# Patient Record
Sex: Female | Born: 1945
Health system: Southern US, Community
[De-identification: ages and names within clinical notes are randomized; demographics above are authoritative.]

## PROBLEM LIST (undated history)

## (undated) DIAGNOSIS — M1990 Unspecified osteoarthritis, unspecified site: Secondary | ICD-10-CM

## (undated) DIAGNOSIS — I10 Essential (primary) hypertension: Secondary | ICD-10-CM

## (undated) DIAGNOSIS — Z5189 Encounter for other specified aftercare: Secondary | ICD-10-CM

## (undated) DIAGNOSIS — G709 Myoneural disorder, unspecified: Secondary | ICD-10-CM

## (undated) DIAGNOSIS — J449 Chronic obstructive pulmonary disease, unspecified: Secondary | ICD-10-CM

## (undated) DIAGNOSIS — I499 Cardiac arrhythmia, unspecified: Secondary | ICD-10-CM

## (undated) DIAGNOSIS — R0600 Dyspnea, unspecified: Secondary | ICD-10-CM

## (undated) DIAGNOSIS — A048 Other specified bacterial intestinal infections: Secondary | ICD-10-CM

## (undated) DIAGNOSIS — K769 Liver disease, unspecified: Secondary | ICD-10-CM

## (undated) DIAGNOSIS — D126 Benign neoplasm of colon, unspecified: Secondary | ICD-10-CM

## (undated) DIAGNOSIS — D649 Anemia, unspecified: Secondary | ICD-10-CM

## (undated) DIAGNOSIS — R040 Epistaxis: Secondary | ICD-10-CM

## (undated) DIAGNOSIS — G20 Parkinson's disease: Secondary | ICD-10-CM

## (undated) DIAGNOSIS — C3490 Malignant neoplasm of unspecified part of unspecified bronchus or lung: Secondary | ICD-10-CM

## (undated) DIAGNOSIS — K5792 Diverticulitis of intestine, part unspecified, without perforation or abscess without bleeding: Secondary | ICD-10-CM

## (undated) DIAGNOSIS — R011 Cardiac murmur, unspecified: Secondary | ICD-10-CM

## (undated) DIAGNOSIS — J45909 Unspecified asthma, uncomplicated: Secondary | ICD-10-CM

## (undated) DIAGNOSIS — F419 Anxiety disorder, unspecified: Secondary | ICD-10-CM

## (undated) DIAGNOSIS — C801 Malignant (primary) neoplasm, unspecified: Secondary | ICD-10-CM

## (undated) DIAGNOSIS — G4730 Sleep apnea, unspecified: Secondary | ICD-10-CM

## (undated) DIAGNOSIS — R911 Solitary pulmonary nodule: Secondary | ICD-10-CM

## (undated) HISTORY — DX: Malignant neoplasm of unspecified part of unspecified bronchus or lung: C34.90

## (undated) HISTORY — PX: VAGINAL HYSTERECTOMY: SUR661

## (undated) HISTORY — PX: APPENDECTOMY: SHX54

## (undated) HISTORY — DX: Diverticulitis of intestine, part unspecified, without perforation or abscess without bleeding: K57.92

## (undated) HISTORY — DX: Anemia, unspecified: D64.9

## (undated) HISTORY — PX: TONSILLECTOMY: SUR1361

## (undated) HISTORY — DX: Liver disease, unspecified: K76.9

## (undated) HISTORY — DX: Sleep apnea, unspecified: G47.30

## (undated) HISTORY — PX: CHOLECYSTECTOMY: SHX55

## (undated) HISTORY — PX: PARTIAL COLECTOMY: SHX5273

## (undated) HISTORY — DX: Epistaxis: R04.0

## (undated) HISTORY — DX: Parkinson's disease: G20

## (undated) HISTORY — PX: TOTAL KNEE ARTHROPLASTY: SHX125

## (undated) HISTORY — DX: Encounter for other specified aftercare: Z51.89

## (undated) HISTORY — DX: Benign neoplasm of colon, unspecified: D12.6

## (undated) HISTORY — PX: COLON SURGERY: SHX602

## (undated) HISTORY — DX: Other specified bacterial intestinal infections: A04.8

## (undated) HISTORY — DX: Solitary pulmonary nodule: R91.1

## (undated) HISTORY — PX: BREAST SURGERY: SHX581

---

## 1987-04-23 DIAGNOSIS — J45909 Unspecified asthma, uncomplicated: Secondary | ICD-10-CM | POA: Insufficient documentation

## 1991-04-25 DIAGNOSIS — E1142 Type 2 diabetes mellitus with diabetic polyneuropathy: Secondary | ICD-10-CM

## 1997-10-27 ENCOUNTER — Encounter: Admission: RE | Admit: 1997-10-27 | Discharge: 1998-01-25 | Payer: Self-pay | Admitting: Internal Medicine

## 1998-07-28 ENCOUNTER — Ambulatory Visit (HOSPITAL_COMMUNITY): Admission: RE | Admit: 1998-07-28 | Discharge: 1998-07-28 | Payer: Self-pay | Admitting: Gastroenterology

## 1998-12-01 ENCOUNTER — Encounter: Payer: Self-pay | Admitting: Emergency Medicine

## 1998-12-01 ENCOUNTER — Inpatient Hospital Stay (HOSPITAL_COMMUNITY): Admission: EM | Admit: 1998-12-01 | Discharge: 1998-12-03 | Payer: Self-pay | Admitting: Emergency Medicine

## 1998-12-02 ENCOUNTER — Encounter: Payer: Self-pay | Admitting: Internal Medicine

## 2000-11-11 ENCOUNTER — Ambulatory Visit (HOSPITAL_BASED_OUTPATIENT_CLINIC_OR_DEPARTMENT_OTHER): Admission: RE | Admit: 2000-11-11 | Discharge: 2000-11-11 | Payer: Self-pay | Admitting: Otolaryngology

## 2001-11-28 ENCOUNTER — Ambulatory Visit (HOSPITAL_COMMUNITY): Admission: RE | Admit: 2001-11-28 | Discharge: 2001-11-28 | Payer: Self-pay | Admitting: Family Medicine

## 2001-11-28 ENCOUNTER — Encounter: Payer: Self-pay | Admitting: Family Medicine

## 2003-07-31 ENCOUNTER — Encounter: Payer: Self-pay | Admitting: Gastroenterology

## 2003-11-28 ENCOUNTER — Emergency Department (HOSPITAL_COMMUNITY): Admission: EM | Admit: 2003-11-28 | Discharge: 2003-11-28 | Payer: Self-pay | Admitting: Emergency Medicine

## 2004-01-19 ENCOUNTER — Inpatient Hospital Stay (HOSPITAL_COMMUNITY): Admission: RE | Admit: 2004-01-19 | Discharge: 2004-01-25 | Payer: Self-pay | Admitting: Specialist

## 2004-01-19 ENCOUNTER — Ambulatory Visit: Payer: Self-pay | Admitting: Physical Medicine & Rehabilitation

## 2004-09-08 ENCOUNTER — Encounter
Admission: RE | Admit: 2004-09-08 | Discharge: 2004-09-08 | Payer: Self-pay | Admitting: Physical Medicine and Rehabilitation

## 2006-06-23 ENCOUNTER — Encounter: Admission: RE | Admit: 2006-06-23 | Discharge: 2006-06-23 | Payer: Self-pay | Admitting: Specialist

## 2006-12-21 ENCOUNTER — Encounter: Admission: RE | Admit: 2006-12-21 | Discharge: 2006-12-21 | Payer: Self-pay | Admitting: Specialist

## 2007-04-03 ENCOUNTER — Ambulatory Visit: Payer: Self-pay | Admitting: Gastroenterology

## 2007-04-23 DIAGNOSIS — F411 Generalized anxiety disorder: Secondary | ICD-10-CM

## 2007-04-23 DIAGNOSIS — I10 Essential (primary) hypertension: Secondary | ICD-10-CM

## 2007-04-23 DIAGNOSIS — E119 Type 2 diabetes mellitus without complications: Secondary | ICD-10-CM | POA: Insufficient documentation

## 2007-04-23 DIAGNOSIS — M129 Arthropathy, unspecified: Secondary | ICD-10-CM | POA: Insufficient documentation

## 2007-04-23 DIAGNOSIS — G4730 Sleep apnea, unspecified: Secondary | ICD-10-CM | POA: Insufficient documentation

## 2007-05-26 DIAGNOSIS — D126 Benign neoplasm of colon, unspecified: Secondary | ICD-10-CM

## 2007-05-26 HISTORY — DX: Benign neoplasm of colon, unspecified: D12.6

## 2007-05-30 ENCOUNTER — Inpatient Hospital Stay (HOSPITAL_COMMUNITY): Admission: EM | Admit: 2007-05-30 | Discharge: 2007-06-02 | Payer: Self-pay | Admitting: Emergency Medicine

## 2007-06-03 ENCOUNTER — Ambulatory Visit: Payer: Self-pay | Admitting: Internal Medicine

## 2007-06-05 ENCOUNTER — Ambulatory Visit: Payer: Self-pay | Admitting: Gastroenterology

## 2007-06-05 LAB — CONVERTED CEMR LAB
Ferritin: 59 ng/mL (ref 10–291)
Iron: 38 ug/dL — ABNORMAL LOW (ref 42–145)

## 2007-06-14 ENCOUNTER — Ambulatory Visit: Payer: Self-pay | Admitting: Gastroenterology

## 2007-06-14 ENCOUNTER — Encounter: Payer: Self-pay | Admitting: Gastroenterology

## 2007-09-12 ENCOUNTER — Telehealth: Payer: Self-pay | Admitting: Gastroenterology

## 2007-09-13 ENCOUNTER — Ambulatory Visit: Payer: Self-pay | Admitting: Cardiovascular Disease

## 2007-09-13 ENCOUNTER — Ambulatory Visit: Payer: Self-pay | Admitting: Gastroenterology

## 2007-09-13 LAB — CONVERTED CEMR LAB
BUN: 9 mg/dL (ref 6–23)
HCT: 38.1 % (ref 36.0–46.0)
Hemoglobin: 12.9 g/dL (ref 12.0–15.0)
Lymphocytes Relative: 22 % (ref 12–46)
Lymphs Abs: 1.4 10*3/uL (ref 0.7–4.0)
Monocytes Absolute: 0.3 10*3/uL (ref 0.1–1.0)
Monocytes Relative: 5 % (ref 3–12)
Neutro Abs: 4.7 10*3/uL (ref 1.7–7.7)
RBC: 4.78 M/uL (ref 3.87–5.11)

## 2007-09-17 ENCOUNTER — Telehealth: Payer: Self-pay | Admitting: Gastroenterology

## 2007-11-28 ENCOUNTER — Telehealth: Payer: Self-pay | Admitting: Gastroenterology

## 2007-12-27 DIAGNOSIS — K2990 Gastroduodenitis, unspecified, without bleeding: Secondary | ICD-10-CM

## 2007-12-27 DIAGNOSIS — Z860100 Personal history of colon polyps, unspecified: Secondary | ICD-10-CM | POA: Insufficient documentation

## 2007-12-27 DIAGNOSIS — K5730 Diverticulosis of large intestine without perforation or abscess without bleeding: Secondary | ICD-10-CM | POA: Insufficient documentation

## 2007-12-27 DIAGNOSIS — K648 Other hemorrhoids: Secondary | ICD-10-CM | POA: Insufficient documentation

## 2007-12-27 DIAGNOSIS — D509 Iron deficiency anemia, unspecified: Secondary | ICD-10-CM

## 2007-12-27 DIAGNOSIS — K2970 Gastritis, unspecified, without bleeding: Secondary | ICD-10-CM | POA: Insufficient documentation

## 2007-12-27 DIAGNOSIS — K219 Gastro-esophageal reflux disease without esophagitis: Secondary | ICD-10-CM

## 2007-12-27 DIAGNOSIS — Z8711 Personal history of peptic ulcer disease: Secondary | ICD-10-CM

## 2007-12-31 ENCOUNTER — Ambulatory Visit: Payer: Self-pay | Admitting: Gastroenterology

## 2008-01-24 ENCOUNTER — Ambulatory Visit: Payer: Self-pay | Admitting: Gastroenterology

## 2008-01-24 ENCOUNTER — Telehealth: Payer: Self-pay | Admitting: Gastroenterology

## 2008-01-24 ENCOUNTER — Inpatient Hospital Stay (HOSPITAL_COMMUNITY): Admission: EM | Admit: 2008-01-24 | Discharge: 2008-01-26 | Payer: Self-pay | Admitting: Emergency Medicine

## 2008-01-27 ENCOUNTER — Telehealth: Payer: Self-pay | Admitting: Gastroenterology

## 2008-01-30 ENCOUNTER — Telehealth: Payer: Self-pay | Admitting: Gastroenterology

## 2008-02-03 ENCOUNTER — Ambulatory Visit: Payer: Self-pay | Admitting: Gastroenterology

## 2008-02-03 DIAGNOSIS — K5732 Diverticulitis of large intestine without perforation or abscess without bleeding: Secondary | ICD-10-CM | POA: Insufficient documentation

## 2008-02-03 LAB — CONVERTED CEMR LAB
BUN: 14 mg/dL (ref 6–23)
Basophils Absolute: 0 10*3/uL (ref 0.0–0.1)
Basophils Relative: 0 % (ref 0–1)
Creatinine, Ser: 0.62 mg/dL (ref 0.40–1.20)
Eosinophils Absolute: 0.1 10*3/uL (ref 0.0–0.7)
MCHC: 32.5 g/dL (ref 30.0–36.0)
MCV: 80.2 fL (ref 78.0–100.0)
Monocytes Relative: 7 % (ref 3–12)
Neutro Abs: 5.3 10*3/uL (ref 1.7–7.7)
Neutrophils Relative %: 66 % (ref 43–77)
Platelets: 308 10*3/uL (ref 150–400)
RDW: 14 % (ref 11.5–15.5)

## 2008-02-04 ENCOUNTER — Telehealth: Payer: Self-pay | Admitting: Gastroenterology

## 2008-02-05 ENCOUNTER — Telehealth: Payer: Self-pay | Admitting: Gastroenterology

## 2008-02-06 ENCOUNTER — Ambulatory Visit: Payer: Self-pay | Admitting: Internal Medicine

## 2008-02-13 ENCOUNTER — Telehealth: Payer: Self-pay | Admitting: Gastroenterology

## 2008-02-17 ENCOUNTER — Encounter: Payer: Self-pay | Admitting: Gastroenterology

## 2008-03-09 ENCOUNTER — Encounter: Admission: RE | Admit: 2008-03-09 | Discharge: 2008-03-09 | Payer: Self-pay | Admitting: Surgery

## 2008-04-15 ENCOUNTER — Inpatient Hospital Stay (HOSPITAL_COMMUNITY): Admission: RE | Admit: 2008-04-15 | Discharge: 2008-04-22 | Payer: Self-pay | Admitting: Surgery

## 2008-04-15 ENCOUNTER — Encounter (INDEPENDENT_AMBULATORY_CARE_PROVIDER_SITE_OTHER): Payer: Self-pay | Admitting: Surgery

## 2008-04-24 DIAGNOSIS — G20 Parkinson's disease: Secondary | ICD-10-CM

## 2008-04-24 DIAGNOSIS — G20A1 Parkinson's disease without dyskinesia, without mention of fluctuations: Secondary | ICD-10-CM

## 2008-04-24 HISTORY — DX: Parkinson's disease without dyskinesia, without mention of fluctuations: G20.A1

## 2008-04-24 HISTORY — DX: Parkinson's disease: G20

## 2008-04-30 ENCOUNTER — Emergency Department (HOSPITAL_COMMUNITY): Admission: EM | Admit: 2008-04-30 | Discharge: 2008-04-30 | Payer: Self-pay | Admitting: Emergency Medicine

## 2008-06-01 ENCOUNTER — Encounter: Admission: RE | Admit: 2008-06-01 | Discharge: 2008-06-01 | Payer: Self-pay | Admitting: Surgery

## 2008-07-09 ENCOUNTER — Encounter (INDEPENDENT_AMBULATORY_CARE_PROVIDER_SITE_OTHER): Payer: Self-pay | Admitting: Surgery

## 2008-07-09 ENCOUNTER — Inpatient Hospital Stay (HOSPITAL_COMMUNITY): Admission: RE | Admit: 2008-07-09 | Discharge: 2008-07-13 | Payer: Self-pay | Admitting: Surgery

## 2008-07-19 ENCOUNTER — Inpatient Hospital Stay (HOSPITAL_COMMUNITY): Admission: EM | Admit: 2008-07-19 | Discharge: 2008-08-05 | Payer: Self-pay | Admitting: Emergency Medicine

## 2008-12-31 ENCOUNTER — Emergency Department (HOSPITAL_COMMUNITY): Admission: EM | Admit: 2008-12-31 | Discharge: 2009-01-01 | Payer: Self-pay | Admitting: Emergency Medicine

## 2009-09-30 ENCOUNTER — Observation Stay (HOSPITAL_COMMUNITY): Admission: EM | Admit: 2009-09-30 | Discharge: 2009-10-01 | Payer: Self-pay | Admitting: Emergency Medicine

## 2010-02-23 ENCOUNTER — Emergency Department (HOSPITAL_COMMUNITY): Admission: EM | Admit: 2010-02-23 | Discharge: 2010-02-23 | Payer: Self-pay | Admitting: Emergency Medicine

## 2010-05-15 ENCOUNTER — Encounter: Payer: Self-pay | Admitting: Specialist

## 2010-07-11 LAB — LIPID PANEL
HDL: 37 mg/dL — ABNORMAL LOW (ref >39)
Total CHOL/HDL Ratio: 4.1 RATIO

## 2010-07-11 LAB — CBC
Hemoglobin: 11.7 g/dL — ABNORMAL LOW (ref 12.0–15.0)
Hemoglobin: 13.6 g/dL (ref 12.0–15.0)
MCHC: 32.9 g/dL (ref 30.0–36.0)
MCHC: 34 g/dL (ref 30.0–36.0)
MCV: 86.9 fL (ref 78.0–100.0)
Platelets: 181 10*3/uL (ref 150–400)
RBC: 4.08 MIL/uL (ref 3.87–5.11)
RDW: 14.7 % (ref 11.5–15.5)

## 2010-07-11 LAB — BASIC METABOLIC PANEL
CO2: 24 mEq/L (ref 19–32)
Calcium: 8.3 mg/dL — ABNORMAL LOW (ref 8.4–10.5)
Creatinine, Ser: 0.64 mg/dL (ref 0.4–1.2)
GFR calc Af Amer: >60 mL/min (ref >60)
Glucose, Bld: 128 mg/dL — ABNORMAL HIGH (ref 70–99)

## 2010-07-11 LAB — POCT I-STAT, CHEM 8
BUN: 10 mg/dL (ref 6–23)
Calcium, Ion: 1.02 mmol/L — ABNORMAL LOW (ref 1.12–1.32)
Chloride: 110 mEq/L (ref 96–112)
Creatinine, Ser: 0.5 mg/dL (ref 0.4–1.2)
Glucose, Bld: 143 mg/dL — ABNORMAL HIGH (ref 70–99)

## 2010-07-11 LAB — DIFFERENTIAL
Eosinophils Absolute: 0.1 10*3/uL (ref 0.0–0.7)
Eosinophils Relative: 2 % (ref 0–5)
Lymphocytes Relative: 21 % (ref 12–46)
Lymphs Abs: 1.5 10*3/uL (ref 0.7–4.0)
Monocytes Absolute: 0.4 10*3/uL (ref 0.1–1.0)
Monocytes Relative: 6 % (ref 3–12)

## 2010-07-11 LAB — CARDIAC PANEL(CRET KIN+CKTOT+MB+TROPI): Relative Index: INVALID (ref 0.0–2.5)

## 2010-07-11 LAB — APTT: aPTT: 66 seconds — ABNORMAL HIGH (ref 24–37)

## 2010-07-11 LAB — POCT CARDIAC MARKERS: Troponin i, poc: <0.05 ng/mL (ref 0.00–0.09)

## 2010-07-11 LAB — HEPARIN LEVEL (UNFRACTIONATED): Heparin Unfractionated: <0.1 IU/mL — ABNORMAL LOW (ref 0.30–0.70)

## 2010-07-11 LAB — MRSA PCR SCREENING: MRSA by PCR: NEGATIVE

## 2010-07-11 LAB — HEMOGLOBIN A1C: Hgb A1c MFr Bld: 7.4 % — ABNORMAL HIGH (ref <5.7)

## 2010-07-11 LAB — GLUCOSE, CAPILLARY
Glucose-Capillary: 146 mg/dL — ABNORMAL HIGH (ref 70–99)
Glucose-Capillary: 98 mg/dL (ref 70–99)

## 2010-07-11 LAB — MAGNESIUM: Magnesium: 1.9 mg/dL (ref 1.5–2.5)

## 2010-07-11 LAB — D-DIMER, QUANTITATIVE: D-Dimer, Quant: 0.4 ug/mL-FEU (ref 0.00–0.48)

## 2010-07-11 LAB — CK TOTAL AND CKMB (NOT AT ARMC)
Relative Index: INVALID (ref 0.0–2.5)
Total CK: 51 U/L (ref 7–177)

## 2010-07-29 LAB — URINE MICROSCOPIC-ADD ON

## 2010-07-29 LAB — COMPREHENSIVE METABOLIC PANEL
ALT: 17 U/L (ref 0–35)
AST: 22 U/L (ref 0–37)
Alkaline Phosphatase: 80 U/L (ref 39–117)
CO2: 25 mEq/L (ref 19–32)
Calcium: 9.3 mg/dL (ref 8.4–10.5)
GFR calc Af Amer: >60 mL/min (ref >60)
GFR calc non Af Amer: >60 mL/min (ref >60)
Glucose, Bld: 198 mg/dL — ABNORMAL HIGH (ref 70–99)
Potassium: 3.9 mEq/L (ref 3.5–5.1)
Sodium: 142 mEq/L (ref 135–145)
Total Protein: 7.2 g/dL (ref 6.0–8.3)

## 2010-07-29 LAB — CBC
HCT: 38.7 % (ref 36.0–46.0)
MCHC: 33 g/dL (ref 30.0–36.0)
MCV: 79.8 fL (ref 78.0–100.0)
WBC: 9 10*3/uL (ref 4.0–10.5)

## 2010-07-29 LAB — DIFFERENTIAL
Basophils Relative: 1 % (ref 0–1)
Eosinophils Absolute: 0.2 10*3/uL (ref 0.0–0.7)
Eosinophils Relative: 2 % (ref 0–5)
Lymphs Abs: 1.7 10*3/uL (ref 0.7–4.0)
Monocytes Relative: 5 % (ref 3–12)

## 2010-07-29 LAB — URINE CULTURE

## 2010-07-29 LAB — URINALYSIS, ROUTINE W REFLEX MICROSCOPIC
Glucose, UA: NEGATIVE mg/dL
Hgb urine dipstick: NEGATIVE
Ketones, ur: NEGATIVE mg/dL
Nitrite: NEGATIVE
Urobilinogen, UA: 0.2 mg/dL (ref 0.0–1.0)
pH: 5.5 (ref 5.0–8.0)

## 2010-08-03 LAB — COMPREHENSIVE METABOLIC PANEL
ALT: 18 U/L (ref 0–35)
AST: 15 U/L (ref 0–37)
AST: 20 U/L (ref 0–37)
Albumin: 2.8 g/dL — ABNORMAL LOW (ref 3.5–5.2)
Albumin: 3.1 g/dL — ABNORMAL LOW (ref 3.5–5.2)
Albumin: 3.1 g/dL — ABNORMAL LOW (ref 3.5–5.2)
Alkaline Phosphatase: 88 U/L (ref 39–117)
BUN: 13 mg/dL (ref 6–23)
BUN: 14 mg/dL (ref 6–23)
BUN: 8 mg/dL (ref 6–23)
CO2: 26 mEq/L (ref 19–32)
Calcium: 8.6 mg/dL (ref 8.4–10.5)
Calcium: 8.8 mg/dL (ref 8.4–10.5)
Chloride: 107 mEq/L (ref 96–112)
Chloride: 107 mEq/L (ref 96–112)
Creatinine, Ser: 0.61 mg/dL (ref 0.4–1.2)
Creatinine, Ser: 0.64 mg/dL (ref 0.4–1.2)
Creatinine, Ser: 0.77 mg/dL (ref 0.4–1.2)
GFR calc Af Amer: >60 mL/min (ref >60)
GFR calc Af Amer: >60 mL/min (ref >60)
GFR calc Af Amer: >60 mL/min (ref >60)
GFR calc non Af Amer: >60 mL/min (ref >60)
Glucose, Bld: 151 mg/dL — ABNORMAL HIGH (ref 70–99)
Potassium: 3.8 mEq/L (ref 3.5–5.1)
Sodium: 143 mEq/L (ref 135–145)
Total Bilirubin: 0.4 mg/dL (ref 0.3–1.2)
Total Bilirubin: 0.4 mg/dL (ref 0.3–1.2)
Total Protein: 6.3 g/dL (ref 6.0–8.3)
Total Protein: 6.4 g/dL (ref 6.0–8.3)
Total Protein: 6.6 g/dL (ref 6.0–8.3)

## 2010-08-03 LAB — GLUCOSE, CAPILLARY
Glucose-Capillary: 132 mg/dL — ABNORMAL HIGH (ref 70–99)
Glucose-Capillary: 133 mg/dL — ABNORMAL HIGH (ref 70–99)
Glucose-Capillary: 136 mg/dL — ABNORMAL HIGH (ref 70–99)
Glucose-Capillary: 136 mg/dL — ABNORMAL HIGH (ref 70–99)
Glucose-Capillary: 140 mg/dL — ABNORMAL HIGH (ref 70–99)
Glucose-Capillary: 141 mg/dL — ABNORMAL HIGH (ref 70–99)
Glucose-Capillary: 141 mg/dL — ABNORMAL HIGH (ref 70–99)
Glucose-Capillary: 142 mg/dL — ABNORMAL HIGH (ref 70–99)
Glucose-Capillary: 142 mg/dL — ABNORMAL HIGH (ref 70–99)
Glucose-Capillary: 143 mg/dL — ABNORMAL HIGH (ref 70–99)
Glucose-Capillary: 143 mg/dL — ABNORMAL HIGH (ref 70–99)
Glucose-Capillary: 147 mg/dL — ABNORMAL HIGH (ref 70–99)
Glucose-Capillary: 148 mg/dL — ABNORMAL HIGH (ref 70–99)
Glucose-Capillary: 149 mg/dL — ABNORMAL HIGH (ref 70–99)
Glucose-Capillary: 150 mg/dL — ABNORMAL HIGH (ref 70–99)
Glucose-Capillary: 151 mg/dL — ABNORMAL HIGH (ref 70–99)
Glucose-Capillary: 154 mg/dL — ABNORMAL HIGH (ref 70–99)
Glucose-Capillary: 155 mg/dL — ABNORMAL HIGH (ref 70–99)
Glucose-Capillary: 156 mg/dL — ABNORMAL HIGH (ref 70–99)
Glucose-Capillary: 158 mg/dL — ABNORMAL HIGH (ref 70–99)
Glucose-Capillary: 158 mg/dL — ABNORMAL HIGH (ref 70–99)
Glucose-Capillary: 159 mg/dL — ABNORMAL HIGH (ref 70–99)
Glucose-Capillary: 159 mg/dL — ABNORMAL HIGH (ref 70–99)
Glucose-Capillary: 164 mg/dL — ABNORMAL HIGH (ref 70–99)
Glucose-Capillary: 165 mg/dL — ABNORMAL HIGH (ref 70–99)
Glucose-Capillary: 166 mg/dL — ABNORMAL HIGH (ref 70–99)
Glucose-Capillary: 168 mg/dL — ABNORMAL HIGH (ref 70–99)
Glucose-Capillary: 170 mg/dL — ABNORMAL HIGH (ref 70–99)
Glucose-Capillary: 170 mg/dL — ABNORMAL HIGH (ref 70–99)
Glucose-Capillary: 171 mg/dL — ABNORMAL HIGH (ref 70–99)
Glucose-Capillary: 171 mg/dL — ABNORMAL HIGH (ref 70–99)
Glucose-Capillary: 176 mg/dL — ABNORMAL HIGH (ref 70–99)
Glucose-Capillary: 178 mg/dL — ABNORMAL HIGH (ref 70–99)
Glucose-Capillary: 181 mg/dL — ABNORMAL HIGH (ref 70–99)
Glucose-Capillary: 185 mg/dL — ABNORMAL HIGH (ref 70–99)
Glucose-Capillary: 187 mg/dL — ABNORMAL HIGH (ref 70–99)
Glucose-Capillary: 191 mg/dL — ABNORMAL HIGH (ref 70–99)
Glucose-Capillary: 197 mg/dL — ABNORMAL HIGH (ref 70–99)
Glucose-Capillary: 197 mg/dL — ABNORMAL HIGH (ref 70–99)
Glucose-Capillary: 201 mg/dL — ABNORMAL HIGH (ref 70–99)
Glucose-Capillary: 203 mg/dL — ABNORMAL HIGH (ref 70–99)
Glucose-Capillary: 203 mg/dL — ABNORMAL HIGH (ref 70–99)
Glucose-Capillary: 208 mg/dL — ABNORMAL HIGH (ref 70–99)
Glucose-Capillary: 215 mg/dL — ABNORMAL HIGH (ref 70–99)
Glucose-Capillary: 291 mg/dL — ABNORMAL HIGH (ref 70–99)

## 2010-08-03 LAB — CARDIAC PANEL(CRET KIN+CKTOT+MB+TROPI)
CK, MB: 0.3 ng/mL (ref 0.3–4.0)
CK, MB: 0.4 ng/mL (ref 0.3–4.0)
Total CK: 22 U/L (ref 7–177)
Troponin I: <0.01 ng/mL (ref 0.00–0.06)

## 2010-08-03 LAB — CHOLESTEROL, TOTAL: Cholesterol: 145 mg/dL (ref 0–200)

## 2010-08-03 LAB — CBC
HCT: 27.5 % — ABNORMAL LOW (ref 36.0–46.0)
HCT: 29.9 % — ABNORMAL LOW (ref 36.0–46.0)
Hemoglobin: 10.8 g/dL — ABNORMAL LOW (ref 12.0–15.0)
MCHC: 34.3 g/dL (ref 30.0–36.0)
MCV: 77.8 fL — ABNORMAL LOW (ref 78.0–100.0)
MCV: 78.9 fL (ref 78.0–100.0)
Platelets: 275 10*3/uL (ref 150–400)
RBC: 3.84 MIL/uL — ABNORMAL LOW (ref 3.87–5.11)
RBC: 4.11 MIL/uL (ref 3.87–5.11)
RDW: 14.4 % (ref 11.5–15.5)
WBC: 5 10*3/uL (ref 4.0–10.5)

## 2010-08-03 LAB — DIFFERENTIAL
Basophils Absolute: 0 10*3/uL (ref 0.0–0.1)
Basophils Relative: 0 % (ref 0–1)
Eosinophils Absolute: 0.2 10*3/uL (ref 0.0–0.7)
Eosinophils Relative: 3 % (ref 0–5)
Lymphocytes Relative: 32 % (ref 12–46)

## 2010-08-03 LAB — BASIC METABOLIC PANEL
BUN: 9 mg/dL (ref 6–23)
CO2: 23 mEq/L (ref 19–32)
CO2: 25 mEq/L (ref 19–32)
Calcium: 9 mg/dL (ref 8.4–10.5)
Chloride: 108 mEq/L (ref 96–112)
Chloride: 110 mEq/L (ref 96–112)
Glucose, Bld: 168 mg/dL — ABNORMAL HIGH (ref 70–99)
Potassium: 4 mEq/L (ref 3.5–5.1)
Sodium: 141 mEq/L (ref 135–145)

## 2010-08-03 LAB — PHOSPHORUS
Phosphorus: 4.1 mg/dL (ref 2.3–4.6)
Phosphorus: 4.2 mg/dL (ref 2.3–4.6)
Phosphorus: 4.5 mg/dL (ref 2.3–4.6)

## 2010-08-03 LAB — TRIGLYCERIDES: Triglycerides: 200 mg/dL — ABNORMAL HIGH (ref <150)

## 2010-08-03 LAB — MAGNESIUM
Magnesium: 2.1 mg/dL (ref 1.5–2.5)
Magnesium: 2.2 mg/dL (ref 1.5–2.5)

## 2010-08-03 LAB — PREALBUMIN: Prealbumin: 26.7 mg/dL (ref 18.0–45.0)

## 2010-08-04 LAB — GLUCOSE, CAPILLARY
Glucose-Capillary: 145 mg/dL — ABNORMAL HIGH (ref 70–99)
Glucose-Capillary: 145 mg/dL — ABNORMAL HIGH (ref 70–99)
Glucose-Capillary: 158 mg/dL — ABNORMAL HIGH (ref 70–99)
Glucose-Capillary: 161 mg/dL — ABNORMAL HIGH (ref 70–99)
Glucose-Capillary: 166 mg/dL — ABNORMAL HIGH (ref 70–99)
Glucose-Capillary: 166 mg/dL — ABNORMAL HIGH (ref 70–99)
Glucose-Capillary: 169 mg/dL — ABNORMAL HIGH (ref 70–99)
Glucose-Capillary: 173 mg/dL — ABNORMAL HIGH (ref 70–99)
Glucose-Capillary: 176 mg/dL — ABNORMAL HIGH (ref 70–99)
Glucose-Capillary: 184 mg/dL — ABNORMAL HIGH (ref 70–99)
Glucose-Capillary: 131 mg/dL — ABNORMAL HIGH (ref 70–99)
Glucose-Capillary: 133 mg/dL — ABNORMAL HIGH (ref 70–99)
Glucose-Capillary: 133 mg/dL — ABNORMAL HIGH (ref 70–99)
Glucose-Capillary: 142 mg/dL — ABNORMAL HIGH (ref 70–99)
Glucose-Capillary: 154 mg/dL — ABNORMAL HIGH (ref 70–99)
Glucose-Capillary: 158 mg/dL — ABNORMAL HIGH (ref 70–99)
Glucose-Capillary: 165 mg/dL — ABNORMAL HIGH (ref 70–99)
Glucose-Capillary: 170 mg/dL — ABNORMAL HIGH (ref 70–99)
Glucose-Capillary: 178 mg/dL — ABNORMAL HIGH (ref 70–99)
Glucose-Capillary: 188 mg/dL — ABNORMAL HIGH (ref 70–99)
Glucose-Capillary: 203 mg/dL — ABNORMAL HIGH (ref 70–99)

## 2010-08-04 LAB — DIFFERENTIAL
Basophils Absolute: 0 10*3/uL (ref 0.0–0.1)
Basophils Absolute: 0 10*3/uL (ref 0.0–0.1)
Basophils Relative: 1 % (ref 0–1)
Eosinophils Absolute: 0.2 10*3/uL (ref 0.0–0.7)
Eosinophils Relative: 4 % (ref 0–5)
Lymphocytes Relative: 18 % (ref 12–46)
Lymphocytes Relative: 23 % (ref 12–46)
Lymphs Abs: 1.2 10*3/uL (ref 0.7–4.0)
Lymphs Abs: 1.3 10*3/uL (ref 0.7–4.0)
Monocytes Absolute: 0.5 10*3/uL (ref 0.1–1.0)
Neutro Abs: 4 10*3/uL (ref 1.7–7.7)
Neutrophils Relative %: 64 % (ref 43–77)
Neutrophils Relative %: 67 % (ref 43–77)
Neutrophils Relative %: 72 % (ref 43–77)

## 2010-08-04 LAB — BASIC METABOLIC PANEL
BUN: 6 mg/dL (ref 6–23)
BUN: 7 mg/dL (ref 6–23)
CO2: 24 mEq/L (ref 19–32)
Calcium: 7.9 mg/dL — ABNORMAL LOW (ref 8.4–10.5)
Calcium: 8.3 mg/dL — ABNORMAL LOW (ref 8.4–10.5)
Creatinine, Ser: 0.65 mg/dL (ref 0.4–1.2)
Creatinine, Ser: 0.66 mg/dL (ref 0.4–1.2)
GFR calc Af Amer: >60 mL/min (ref >60)
GFR calc non Af Amer: >60 mL/min (ref >60)
GFR calc non Af Amer: >60 mL/min (ref >60)
GFR calc non Af Amer: >60 mL/min (ref >60)
Glucose, Bld: 215 mg/dL — ABNORMAL HIGH (ref 70–99)
Potassium: 3.5 mEq/L (ref 3.5–5.1)

## 2010-08-04 LAB — CBC
HCT: 25.6 % — ABNORMAL LOW (ref 36.0–46.0)
HCT: 26.6 % — ABNORMAL LOW (ref 36.0–46.0)
HCT: 28 % — ABNORMAL LOW (ref 36.0–46.0)
Hemoglobin: 8.6 g/dL — ABNORMAL LOW (ref 12.0–15.0)
Hemoglobin: 9.4 g/dL — ABNORMAL LOW (ref 12.0–15.0)
MCHC: 33.6 g/dL (ref 30.0–36.0)
MCHC: 33.8 g/dL (ref 30.0–36.0)
MCV: 79.7 fL (ref 78.0–100.0)
MCV: 80 fL (ref 78.0–100.0)
Platelets: 244 10*3/uL (ref 150–400)
RBC: 3.81 MIL/uL — ABNORMAL LOW (ref 3.87–5.11)
RBC: 3.2 MIL/uL — ABNORMAL LOW (ref 3.87–5.11)
RDW: 15.1 % (ref 11.5–15.5)
WBC: 5.2 10*3/uL (ref 4.0–10.5)
WBC: 6 10*3/uL (ref 4.0–10.5)
WBC: 7.2 10*3/uL (ref 4.0–10.5)

## 2010-08-04 LAB — COMPREHENSIVE METABOLIC PANEL
AST: 14 U/L (ref 0–37)
Alkaline Phosphatase: 68 U/L (ref 39–117)
BUN: 8 mg/dL (ref 6–23)
CO2: 26 mEq/L (ref 19–32)
CO2: 27 mEq/L (ref 19–32)
Calcium: 8.1 mg/dL — ABNORMAL LOW (ref 8.4–10.5)
Chloride: 108 mEq/L (ref 96–112)
Chloride: 110 mEq/L (ref 96–112)
Creatinine, Ser: 0.66 mg/dL (ref 0.4–1.2)
Creatinine, Ser: 0.73 mg/dL (ref 0.4–1.2)
GFR calc Af Amer: >60 mL/min (ref >60)
GFR calc non Af Amer: >60 mL/min (ref >60)
GFR calc non Af Amer: >60 mL/min (ref >60)
Glucose, Bld: 138 mg/dL — ABNORMAL HIGH (ref 70–99)
Glucose, Bld: 179 mg/dL — ABNORMAL HIGH (ref 70–99)
Potassium: 3.9 mEq/L (ref 3.5–5.1)
Total Bilirubin: 0.3 mg/dL (ref 0.3–1.2)
Total Bilirubin: 0.8 mg/dL (ref 0.3–1.2)

## 2010-08-04 LAB — LIPASE, BLOOD: Lipase: 26 U/L (ref 11–59)

## 2010-08-04 LAB — URINALYSIS, ROUTINE W REFLEX MICROSCOPIC
Hgb urine dipstick: NEGATIVE
Nitrite: NEGATIVE
Protein, ur: NEGATIVE mg/dL
Urobilinogen, UA: 0.2 mg/dL (ref 0.0–1.0)

## 2010-08-04 LAB — MAGNESIUM: Magnesium: 2.4 mg/dL (ref 1.5–2.5)

## 2010-08-04 LAB — CHOLESTEROL, TOTAL: Cholesterol: 130 mg/dL (ref 0–200)

## 2010-08-04 LAB — TRIGLYCERIDES: Triglycerides: 119 mg/dL (ref <150)

## 2010-08-04 LAB — URINE MICROSCOPIC-ADD ON

## 2010-08-04 LAB — PREALBUMIN: Prealbumin: 13.5 mg/dL — ABNORMAL LOW (ref 18.0–45.0)

## 2010-08-24 ENCOUNTER — Inpatient Hospital Stay (HOSPITAL_COMMUNITY)
Admission: EM | Admit: 2010-08-24 | Discharge: 2010-08-26 | DRG: 390 | Disposition: A | Discharge status: Home / Self Care | Payer: Self-pay | Attending: General Surgery | Admitting: General Surgery

## 2010-08-24 ENCOUNTER — Emergency Department (HOSPITAL_COMMUNITY): Payer: Self-pay

## 2010-08-24 ENCOUNTER — Encounter (HOSPITAL_COMMUNITY): Payer: Self-pay | Admitting: Radiology

## 2010-08-24 DIAGNOSIS — K56609 Unspecified intestinal obstruction, unspecified as to partial versus complete obstruction: Principal | ICD-10-CM | POA: Diagnosis present

## 2010-08-24 DIAGNOSIS — E119 Type 2 diabetes mellitus without complications: Secondary | ICD-10-CM | POA: Diagnosis present

## 2010-08-24 DIAGNOSIS — Z794 Long term (current) use of insulin: Secondary | ICD-10-CM

## 2010-08-24 DIAGNOSIS — I10 Essential (primary) hypertension: Secondary | ICD-10-CM | POA: Diagnosis present

## 2010-08-24 DIAGNOSIS — F411 Generalized anxiety disorder: Secondary | ICD-10-CM | POA: Diagnosis present

## 2010-08-24 HISTORY — DX: Essential (primary) hypertension: I10

## 2010-08-24 LAB — HEPATIC FUNCTION PANEL
AST: 13 U/L (ref 0–37)
Albumin: 4.1 g/dL (ref 3.5–5.2)
Total Bilirubin: 0.2 mg/dL — ABNORMAL LOW (ref 0.3–1.2)
Total Protein: 8 g/dL (ref 6.0–8.3)

## 2010-08-24 LAB — BASIC METABOLIC PANEL
BUN: 12 mg/dL (ref 6–23)
Creatinine, Ser: 0.65 mg/dL (ref 0.4–1.2)
GFR calc non Af Amer: >60 mL/min (ref >60)
Glucose, Bld: 206 mg/dL — ABNORMAL HIGH (ref 70–99)
Potassium: 4.2 mEq/L (ref 3.5–5.1)

## 2010-08-24 LAB — DIFFERENTIAL
Eosinophils Relative: 3 % (ref 0–5)
Lymphocytes Relative: 27 % (ref 12–46)
Lymphs Abs: 2.6 10*3/uL (ref 0.7–4.0)
Monocytes Absolute: 0.6 10*3/uL (ref 0.1–1.0)
Monocytes Relative: 6 % (ref 3–12)

## 2010-08-24 LAB — URINALYSIS, ROUTINE W REFLEX MICROSCOPIC
Bilirubin Urine: NEGATIVE
Hgb urine dipstick: NEGATIVE
Ketones, ur: NEGATIVE mg/dL
Nitrite: NEGATIVE
pH: 5.5 (ref 5.0–8.0)

## 2010-08-24 LAB — CBC
HCT: 42.4 % (ref 36.0–46.0)
MCH: 29.9 pg (ref 26.0–34.0)
MCHC: 35.1 g/dL (ref 30.0–36.0)
MCV: 85 fL (ref 78.0–100.0)
RDW: 13.2 % (ref 11.5–15.5)
WBC: 9.6 10*3/uL (ref 4.0–10.5)

## 2010-08-24 LAB — URINE MICROSCOPIC-ADD ON

## 2010-08-24 MED ORDER — IOHEXOL 300 MG/ML  SOLN
100.0000 mL | Freq: Once | INTRAMUSCULAR | Status: AC | PRN
  Administered 2010-08-24: 100 mL via INTRAVENOUS

## 2010-08-25 ENCOUNTER — Inpatient Hospital Stay (HOSPITAL_COMMUNITY): Payer: Self-pay

## 2010-08-25 LAB — DIFFERENTIAL
Basophils Relative: 0 % (ref 0–1)
Lymphocytes Relative: 17 % (ref 12–46)
Lymphs Abs: 1.7 10*3/uL (ref 0.7–4.0)
Monocytes Absolute: 0.7 10*3/uL (ref 0.1–1.0)
Monocytes Relative: 7 % (ref 3–12)
Neutro Abs: 7.6 10*3/uL (ref 1.7–7.7)

## 2010-08-25 LAB — CBC
HCT: 39.4 % (ref 36.0–46.0)
Hemoglobin: 13.5 g/dL (ref 12.0–15.0)
MCH: 29.5 pg (ref 26.0–34.0)
MCHC: 34.3 g/dL (ref 30.0–36.0)
MCV: 86 fL (ref 78.0–100.0)

## 2010-08-25 LAB — BASIC METABOLIC PANEL
BUN: 11 mg/dL (ref 6–23)
CO2: 25 mEq/L (ref 19–32)
Chloride: 103 mEq/L (ref 96–112)
Creatinine, Ser: 0.67 mg/dL (ref 0.4–1.2)
Potassium: 4.3 mEq/L (ref 3.5–5.1)

## 2010-08-25 LAB — GLUCOSE, CAPILLARY: Glucose-Capillary: 114 mg/dL — ABNORMAL HIGH (ref 70–99)

## 2010-08-25 LAB — HEMOGLOBIN A1C: Mean Plasma Glucose: 169 mg/dL — ABNORMAL HIGH (ref <117)

## 2010-08-26 ENCOUNTER — Inpatient Hospital Stay (HOSPITAL_COMMUNITY): Payer: Self-pay

## 2010-08-26 LAB — GLUCOSE, CAPILLARY: Glucose-Capillary: 148 mg/dL — ABNORMAL HIGH (ref 70–99)

## 2010-08-26 LAB — BASIC METABOLIC PANEL
Calcium: 9.1 mg/dL (ref 8.4–10.5)
Creatinine, Ser: 0.5 mg/dL (ref 0.4–1.2)
GFR calc Af Amer: >60 mL/min (ref >60)
GFR calc non Af Amer: >60 mL/min (ref >60)
Glucose, Bld: 123 mg/dL — ABNORMAL HIGH (ref 70–99)
Sodium: 140 mEq/L (ref 135–145)

## 2010-08-29 NOTE — H&P (Signed)
[name], [name]            ACCOUNT NO.:  [id number]  MEDICAL RECORD NO.:  [id number]           PATIENT TYPE:  I  LOCATION:  5118                         FACILITY:  [facility]  PHYSICIAN:  Cherylynn Ridges, M.D.    DATE OF BIRTH:  [birth year]  DATE OF ADMISSION:  08/24/2010 DATE OF DISCHARGE:                             HISTORY & PHYSICAL   IDENTIFICATION/CHIEF COMPLAINT:  The patient is a 65 year old female with an early partial small-bowel obstruction.  HISTORY OF PRESENT ILLNESS:  The patient reports being ill since just prior to the weekend when she developed some cramping and lower abdominal pain.  This is associated with nausea, but no vomiting.  She questioned possibly some chills, but no documented fevers.  She had been doing well for several years after her last surgery which was in 2010 by Dr. Luisa Hart for a takedown of ileostomy.  She otherwise was feeling well, but then the pain take of which she came in today when the pain was unrelenting.  It was a 10/10 pain when she came to the emergency room.  A CT scan of the abdomen and pelvis with contrast demonstrated some dilated loops in her pelvis, but not a high-grade small bowel obstruction.  It appeared to be some type of transition point in the pelvis either an internal hernia or adhesive partial small-bowel obstruction.  Surgery consultation was obtained.  PAST MEDICAL HISTORY:  Significant for; 1. Morbid obesity. 2. Insulin-dependent diabetes mellitus. 3. Hypertension. 4. Depression. 5. Chronic obstructive pulmonary disease. 6. Obstructive sleep apnea. 7. Coronary artery disease atypical with a normal cath in June 2011. 8. Parkinson's disease. 9. Dyslipidemia.  MEDICATIONS:  Listed today include: 1. Benazepril. 2. Lantus. 3. Xanax. 4. Simvastatin. 5. Carbidopa. 6. Iron. 7. Levodopa 8. Neurontin. 9. Vitamin B12. 10.There is a possibility that she is also taking aspirin, but not on     Plavix.  ALLERGIES:   She has no documented drug allergies, although on one documented history and physical it said something about being allergic to DILAUDID and OXYCODONE, this was not confirmed by the patient.  REVIEW OF SYSTEMS:  She had a normal bowel movement this morning even though she was having no crampy abdominal pain.  She has had no fevers. No diarrhea.  She has had no vomiting.  PHYSICAL EXAMINATION:  VITAL SIGNS:  She is afebrile at 98.6, pulse of 104, blood pressure 109/62. HEENT:  She is normocephalic, atraumatic, and anicteric.  She has an NG tube coming out, I believe over left nostril, which is to low intermittent suction with minimal amounts of diluted bilious fluid. NECK:  Supple with no palpable masses.  She had no bruits. LUNGS:  Clear to auscultation.  She has no wheezing. CARDIAC:  Regular rhythm and rate with no murmurs. ABDOMEN:  Soft.  She is morbidly obese.  There is hypoactive bowel sounds.  No palpable hernias.  No rebound or guarding. RECTAL:  Not performed. PELVIC:  Not performed. NEUROLOGIC:  Cranial nerves II-XII appear to be grossly intact.  She has got normal mental functioning.  Does not appear to be in any depressed mood state or psychotic  state.  LABORATORY STUDIES:  White count is normal at 9.8, hemoglobin was 14.9, hematocrit of 42.4.  Her electrolytes are all within normal limits with glucose is 206.  UA demonstrates no evidence of UTI.  I have reviewed her CT scan which shows partial low grade small-bowel obstruction in the pelvis, and no evidence of perforation, free air, or bleeding.  IMPRESSION:  Early partial small-bowel obstruction with a possible hairpin loop, internal hernia, or adhesive obstruction.  PLAN:  The plan is to bring him to the hospital for IV pain control, IV hydration, NG tube decompression, and hopefully getting through this with all surgical intervention.  She will be admitted to the surgical hospitalist or doc of the week program for  continued care.  There is no need for surgical intervention tonight.  We will put her on an insulin sliding scale after admission and repeat her x-rays in the morning.  No antibiotics are necessary at this time.     Cherylynn Ridges, M.D.     JOW/MEDQ  D:  08/24/2010  T:  08/25/2010  Job:  [id number]  Electronically Signed by Jimmye Norman M.D. on 08/29/2010 04:13:23 PM

## 2010-09-06 NOTE — H&P (Signed)
[name], [name]            ACCOUNT NO.:  [id number]   MEDICAL RECORD NO.:  [id number]          PATIENT TYPE:  EMS   LOCATION:  MAJO                         FACILITY:  [facility]   PHYSICIAN:  Hedwig Morton. Juanda Chance, MD     DATE OF BIRTH:  13-Dec-1945   DATE OF ADMISSION:  05/30/2007  DATE OF DISCHARGE:                              HISTORY & PHYSICAL   PROBLEM:  Diverticulitis.   HISTORY:  [name] is a pleasant 65 year old white female, primary  patient of Dr. Wylene Simmer, known recently to Dr. Russella Dar with GI consult in  the office.  The patient has a history of insulin-dependent diabetes  mellitus, osteoarthritis, asthma who presents to the emergency room with  a throbbing left lower quadrant pain x1 week which has been progressive.  The patient says the pain was 20/10 last evening and has been  constant.  She did have fever to 101 last evening at home and has been  having chills.  Initially she thought she had a urinary tract infection  because she was having some pressure.  She saw Dr. Wylene Simmer in his office  last week.  A UA was negative and she was placed on a 7 day course of  Cipro 500 b.i.d. for possible diverticulitis.  Her symptoms have not  improved at all with p.o. antibiotics and she presents to the emergency  room today for further evaluation.  She has not had any diarrhea, melena  or hematochezia.  White count is elevated at 12.4 and CT scan of the  abdomen and pelvis is consistent with an acute sigmoid diverticulitis.  There is no evidence of perforation or abscess.  She is admitted at this  time for bowel rest, IV antibiotics and pain control.   PAST MEDICAL HISTORY:  As outlined above.  In addition, she has had a  right total knee replacement, hysterectomy, appendectomy,  cholecystectomy, bilateral mastectomies with implants which were done  prophylactically due to family history.  She also has history of GERD  and apparently sleep apnea.   MEDICATIONS:  1. Advair she  believes 250 or 350/50 b.i.d.  2. Albuterol inhaler p.r.n.  3. Lantus 20 units daily at bedtime.  4. Humalog 10 units a.c.  5. Xanax 0.5 t.i.d.   ALLERGIES:  No known drug allergies.   FAMILY HISTORY:  Is pertinent for breast cancer, diabetes mellitus.  Negative for colon cancer or IBD.   SOCIAL HISTORY:  The patient is single.  Her son lives with her.  She is  employed in data entry, is ambulatory.  She is a nonsmoker, nondrinker.   REVIEW OF SYSTEMS:  HEENT:  Is unremarkable.  CARDIOVASCULAR:  Denies  any chest pain or anginal symptoms.  PULMONARY:  Negative for cough,  shortness of breath or sputum production.  GENITOURINARY:  No current  dysuria, urgency or frequency.  GI:  As outlined above.  MUSCULOSKELETAL:  Pertinent for osteoarthritis.  She has low back pain  and problems with her knees.  NEUROLOGICAL:  Is negative.  All other  review of systems negative.   LABS:  WBC of 12.4, hemoglobin 13.1, hematocrit of  39, MCV of 77.  LFTs  within normal limits.  Lipase 22.  UA 0-2 RBCs, BUN 8, creatinine 0.69.   PHYSICAL EXAMINATION:  GENERAL:  A well-developed, obese white female in  no acute distress.  Alert and oriented x3.  VITAL SIGNS:  Pulses in the 70s.  She is uncomfortable.  HEENT:  Nontraumatic, normocephalic.  EOMI, PERRLA.  Sclerae anicteric.  NECK:  Neck is supple without nodes.  CARDIOVASCULAR:  Regular rate and rhythm with S1-S2.  No murmur, rub or  gallop.  PULMONARY:  Clear to A and P.  ABDOMEN:  Obese, large, soft.  Bowel sounds are present.  She is tender  in the left lower quadrant and suprapubic region.  There is no rebound.  No guarding.  RECTAL:  Exam is not done at this time.  EXTREMITIES:  Without clubbing, cyanosis or edema.  She does have a scar  over her right knee.  NEUROLOGICAL:  Grossly nonfocal.   IMPRESSION:  4. A 64 year old white female with acute sigmoid diverticulitis,      recurrent.  2. Insulin-dependent diabetes mellitus.  3.  Obesity.  4. Osteoarthritis.  5. Asthma.  6. Status post cholecystectomy, appendectomy and hysterectomy.   PLAN:  The patient is admitted to the service of Dr. Lina Sar for IV  fluid hydration, bowel rest, pain control.  Will start IV Cipro and IV  Flagyl and place her on sliding scale insulin coverage and she will  eventually need colonoscopy which is scheduled for later this month once  her diverticulitis has resolved.      [name] [place], PA-C      [name], MD  Electronically Signed    AE/MEDQ  D:  05/30/2007  T:  05/31/2007  Job:  [id number]   cc:   Venita Lick. Russella Dar, MD, Lauro Franklin, M.D.

## 2010-09-06 NOTE — Op Note (Signed)
[name], [name]            ACCOUNT NO.:  [id number]   MEDICAL RECORD NO.:  [id number]          PATIENT TYPE:  INP   LOCATION:  0004                         FACILITY:  [facility]   PHYSICIAN:  Thomas A. Cornett, M.D.DATE OF BIRTH:  May 25, 1945   DATE OF PROCEDURE:  07/09/2008  DATE OF DISCHARGE:                               OPERATIVE REPORT   PREOPERATIVE DIAGNOSIS:  History of diverticulitis, status post sigmoid  colon resection requiring loop ileostomy to allow healing of anastomosis  of pelvis.   POSTOPERATIVE DIAGNOSIS:  History of diverticulitis, status post sigmoid  colon resection requiring loop ileostomy to allow healing of anastomosis  of pelvis.   PROCEDURE:  Closure of loop ileostomy.   SURGEON:  Maisie Fus A. Cornett, M.D.   ASSISTANT:  Wilmon Arms. Tsuei, M.D.   ANESTHESIA:  General endotracheal anesthesia.   ESTIMATED BLOOD LOSS:  150 cc.   SPECIMEN:  Ileum and ostomy to pathology.   INDICATIONS FOR PROCEDURE:  The patient is a 65 year old female with  morbid obesity who underwent sigmoid colectomy in December of last year.  There was some leakage at her anastomosis during the initial procedure  and a loop ileostomy was constructed to allow healing of this  intraoperatively.  This anastomosis has healed adequately and she is  here today to have her loop ileostomy closed such that she may have  continence again of stool through her rectum.  She does have diabetes  mellitus and multiple risk factors and morbid obesity, which I talked to  her about preoperatively.  Complications of bleeding, infection,  anastomotic leak, anastomotic malfunction or breakdown, abdominal wall  dehiscence, infection, small-bowel obstruction, all potential  complications which we discussed preoperatively.  She agreed to proceed.   DESCRIPTION OF PROCEDURE:  The patient was brought to the operating room  and placed supine.  After induction of general anesthesia, the right  lower quadrant  ostomy site was prepped and draped in a sterile fashion.  A curvilinear incision was made around the ostomy.  We then dissected  circumferentially around the ostomy until we encountered the fascia.  The fascia of the ileostomy was densely adherent to the fascia and it  took some time to dissect this off the fascia and fascial edge.  We made  a small serosal tear on the efferent limb doing this.  Eventually we  were able to dissect circumferentially around this, but this was quite  difficult due to her obesity and these adhesions, but we were able to  clear this up and examine the bowel.  I was able to mobilize more of the  small bowel up into the wound.  Given the location of the serosal tear,  I was not comfortable trying to perform a side-to-side stapled  anastomosis and felt that a hand-sewn anastomosis would be better in  this setting using the area of serosal tear as an enterotomy, which I  used.  We then put a stitch in across the anastomosis.  We then made 2  separate enterotomies about 3 cm length on the afferent and efferent  limb.  We then constructed a side-to-side anastomosis  using 3-0 silk  sutures in an interrupted fashion with a single layer.  The posterior  wall was done first and then we transitioned to the anterior wall with  care taken not to narrow or stenose the anastomosis.  Once we were able  to close the entire anastomosis, we amputated the ostomy and small bowel  associated with it above this.  The mesentery was taken down in between  the clamps with 2-0 and 3-0 silk ties.  We examined the anastomosis and  found to be hemostatic.  A 2-0 silk was used in the crotch of the  anastomosis to take the tension off this.  The anastomosis was patent to  2 fingers.  It showed no signs of ischemia.  I did not see any leakage  of enteric contents at this point in time.  I then irrigated the  abdomen.  At this point in time the anastomosis looked good without  tension.  We  reduced this back into the intra-abdominal cavity.  Irrigation was used and suctioned out.  Fascia was closed with #1 PDS in  a running fashion.  I left the skin open due to her obesity as well as  diabetes mellitus.  This was packed with saline-soaked gauze.  All final  counts of sponge, needle and instruments were found to be correct this  portion of the case.  The patient was awoken, taken to recovery in  satisfactory condition.      Thomas A. Cornett, M.D.  Electronically Signed     TAC/MEDQ  D:  07/09/2008  T:  07/09/2008  Job:  [id number]   cc:   Gaspar Garbe, M.D.  Fax: [phone]

## 2010-09-06 NOTE — H&P (Signed)
NAMEGILMA, BESSETTE            ACCOUNT NO.:  [id number]   MEDICAL RECORD NO.:  [id number]          PATIENT TYPE:  INP   LOCATION:  5118                         FACILITY:  [facility]   PHYSICIAN:  Ollen Gross. Vernell Morgans, M.D. DATE OF BIRTH:  1945/09/29   DATE OF ADMISSION:  07/19/2008  DATE OF DISCHARGE:                              HISTORY & PHYSICAL   Ms. Paulick is a 65 year old white female who is now about 10 days out  from ileostomy takedown by Dr. Luisa Hart.  She initially did well but then  earlier today initially, developed some bloody drainage from her wound  and then subsequently some succus-type drainage from the wound.  She  denies any fevers or chills.  She denies really much abdominal pain  except locally at the site.  She came to the emergency department for  further evaluation of this.   PAST MEDICAL HISTORY:  Significant for:  1. Anxiety.  2. Asthma.  3. Degenerative joint disease.  4. Depression.  5. Diabetes.  6. Diverticulitis.  7. Hypertension.  8. Kidney stones.   PAST SURGICAL HISTORY:  Significant for sigmoid colectomy with diverting  ileostomy and subsequent ileostomy takedown.   MEDICATIONS:  Lantus, Advair, Betapace, and Zoloft.   ALLERGIES:  DILAUDID and OXYCODONE.   SOCIAL HISTORY:  She denies use of alcohol or tobacco products.   FAMILY HISTORY:  Noncontributory.   PHYSICAL EXAMINATION:  VITAL SIGNS:  Her temperature is 98.9, pulse 79,  blood pressure 161/76.  GENERAL:  She is a well-developed, well-nourished white female in no  acute distress.  SKIN:  Warm and dry.  No jaundice.  HEENT:  Eyes are anicteric.  Extraocular movements are intact.  Pupils  are equal, round, and reactive to light.  Sclerae nonicteric.  LUNGS:  Clear bilaterally with no use of accessory respiratory muscles.  HEART:  Regular rate and rhythm with impulse in the left chest.  ABDOMEN:  Soft and nontender except for locally at the site.  No  peritonitis.  No guarding.  She  does have an open right-sided abdominal  wound that is draining some succus-type material.  EXTREMITIES:  No cyanosis, clubbing, or edema.  Good strength in her  arms and legs.  PSYCHOLOGIC:  She is alert and oriented x3 with no evidence of anxiety  or depression.   On review of her lab work, it was significant for white count of 6000.   ASSESSMENT AND PLAN:  This is a 65 year old white female 10 days out  from ileostomy takedown and now has an enterocutaneous fistula.  She has  no physical signs of sepsis or significant abdominal pain from this.  Because of this, I think it would be very reasonable at this point to  admit her for bowel rest and IV hydration.  We will start her on total  parenteral nutrition and see if the fistula will close without surgical  intervention.  I have discussed the case with Dr. Luisa Hart and he is in  agreement.      Ollen Gross. Vernell Morgans, M.D.  Electronically Signed     PST/MEDQ  D:  07/19/2008  T:  07/20/2008  Job:  [id number]

## 2010-09-06 NOTE — Discharge Summary (Signed)
[name], [name]            ACCOUNT NO.:  [id number]   MEDICAL RECORD NO.:  [id number]          PATIENT TYPE:  INP   LOCATION:  5152                         FACILITY:  [facility]   PHYSICIAN:  Thomas A. Cornett, M.D.DATE OF BIRTH:  02/20/1946   DATE OF ADMISSION:  07/19/2008  DATE OF DISCHARGE:  08/05/2008                               DISCHARGE SUMMARY   ADMITTING DIAGNOSES:  1. 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  2. Diabetes mellitus type 2.  3. Hypertension.  4. Obesity.  5. Protein-calorie malnutrition, mild.  6. History of diverticulitis.   DISCHARGE DIAGNOSES:  1. 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  2. Diabetes mellitus type 2.  3. Hypertension.  4. Obesity.  5. Protein-calorie malnutrition, mild.  6. History of diverticulitis.   PROCEDURES PERFORMED:  None.   INDICATIONS/BRIEF HISTORY:  The patient is a 65 year old female who is  admitted from July 19, 2008, 10 days after takedown of loop ileostomy.  She developed succus drainage from her right lower quadrant, was  admitted with a presumed diagnoses of enterocutaneous fistula verified  by CT scan.  She is placed on IV antibiotics and has been n.p.o., placed  on a octreotide as well as TNA.   HOSPITAL COURSE:  The patient's hospital course was relatively  unremarkable.  Her drainage slowly decreased over the next 7-10 days in  the hospital.  Octreotide was started a week after admission, this  seemed to help the most.  She was on TNA and on Zosyn for 10 days and  stopped.  A PICC line was placed while she was here for all this.  After  being in the hospital for almost 2 weeks, her fistula closed down.  I  repeated her CT scan, which showed no extravasation of contrast.  There  were few small locules of air in the subcu tissue but her wound was  being packed open.  Her drainage had stopped at this point.  Her diet  was advanced from a clear liquid diet to a full  liquid diet without any  increase in output from her right lower quadrant fistula.  She was  discharged home on August 05, 2008, on full liquid diet.   DISCHARGE INSTRUCTIONS:  She will follow up with me in 10-14 days.  We  will continue wet-to-dry dressing changes to her right lower quadrant  b.i.d.  She will resume her home medications as outlined on the medical  reconciliation list.   CONDITION AT DISCHARGE:  Improved.      Thomas A. Cornett, M.D.  Electronically Signed     TAC/MEDQ  D:  08/05/2008  T:  08/05/2008  Job:  [id number]

## 2010-09-06 NOTE — Op Note (Signed)
[name], [name]            ACCOUNT NO.:  [id number]   MEDICAL RECORD NO.:  [id number]          PATIENT TYPE:  INP   LOCATION:  5159                         FACILITY:  [facility]   PHYSICIAN:  Maisie Fus A. Cornett, M.D.DATE OF BIRTH:  07/17/1945   DATE OF PROCEDURE:  04/15/2008  DATE OF DISCHARGE:                               OPERATIVE REPORT   PREOPERATIVE DIAGNOSIS:  Sigmoid diverticulitis.   POSTOPERATIVE DIAGNOSIS:  Sigmoid diverticulitis with questionable  enterovesical fistula.   PROCEDURES:  1. Diagnostic laparoscopy.  2. Sigmoid colectomy with anastomosis.  3. Diverting loop ileostomy.  4. Rigid sigmoidoscopy.   SURGEON:  Maisie Fus A. Cornett, MD   ASSISTANT:  Leonie Man, MD   ESTIMATED BLOOD LOSS:  150 mL.   ANESTHESIA:  General endotracheal anesthesia.   SPECIMEN:  Sigmoid colon.   DRAINS:  37 Blake.   DRAIN:  To pelvis.   INDICATIONS FOR PROCEDURE:  The patient is a 65 year old female with  multiple bouts of sigmoid diverticulitis requiring hospitalization and  antibiotics.  She was last seen back in October and after discussion  about options and she keeps having recurrent bouts requiring IV  antibiotics and hospitalizations, we discussed surgical options with  her.  She is at a higher risk due to her morbid obesity, diabetes  mellitus, and other medical issues.  We talked about that in the office.  She is having such frequent recurrences of her diverticulitis that we  feel sigmoid colectomy would be helpful to her and she thought this over  and decided to go ahead and proceed.  A preop barium enema showed focal  disease in her sigmoid colon and some other scattered diverticula.   DESCRIPTION OF PROCEDURE:  The patient was brought to the operating room  and placed supine.  After induction of general anesthesia, the abdomen  was prepped and draped in a sterile fashion as well as the perineum.  Foley catheter was placed.  A 5-mm OptiVu was used to gain  access to the  abdominal cavity via the patient's left lower quadrant.  Upon entering,  there were very few adhesions.  We saw some adhesions in her right upper  quadrant from previous open cholecystectomy.  Otherwise, it was quite  free.  We then placed three 5-mm ports, one in the right upper quadrant,  one in the right mid abdomen, and the third in the midline just below  the umbilicus.  We then put graspers and examined the colon.  The colon  though was fairly floppy.  It was quite stuck though in the pelvis  anteriorly and this was not going to be easy to mobilize carefully.  Also, we had a hard time seeing her white line of Toldt due to her large  size.  Even with pneumoperitoneum, we could not develop enough space  intra-abdominally to proceed.  I did not want to proceed without good  visualization which we could not get this by putting her in  Trendelenburg due to her severe morbid obesity.  I felt an open incision  would give [place] better visualization to do the procedure safely.  With the  CO2 escape, we passed off our instruments.   Next, a midline incision was used.  Dissection was carried down until we  entered the abdominal cavity and CO2 that was remaining escaped.  A  large retractor was used to gain access.  She was placed in  Trendelenburg.  The small bowel was packed all the way with green towels  and sponges we counted carefully.  Sigmoid colon was actually quite soft  until it got into the pelvis.  It was redundant, stuck on the posterior  wall of the bladder.  This was densely adherent and I was able to finger  fracture this off.  I did not see any evidence of the hole in the  bladder or fistula, but there was significant inflammation here.  This  corresponded to her area of disease.  The descending colon had some  scattered diverticula, but was relatively healthy.  We mobilized the  colon, then along the white line of Toldt toward the splenic flexure.  We then  mobilized inferiorly.  The sigmoid colon was quite redundant and  floppy and we were able to find it and found healthy rectum distally and  then proximal colon that was healthy.  We divided the colon proximally  using a GIA 75 stapling device.  The mesentery was taken down close to  the bowel using the LigaSure.  The distal extent was down to the distal  sigmoid proximal rectum region which was soft and pliable.  We divided  the bowel there with a TA-60.  We then excised the specimen after taking  the mesentery down with the LigaSure.  We were well away from the left  ureter.  We then examined and felt we could do an end-to-end anastomosis  using a stapling device.  We then placed a purse-string suture on the  proximal colon after clearing off the fat using a purse-string applier  and a 2-0 Prolene.  We then took off the staple line, placed a 29 EEA  sizer and this fit quite nicely.  We then put a 29 anvil here and tied  this down.  I went below and passed the dilator without difficulty.  I  then passed the stapling device under direct vision and was able to find  the distal stump.  We then had our anvil come out just proximal to the  staple line.  The anvil was clipped onto the stapler.  It was closed  down easily and then a stapler was fired after holding it for 30  seconds.  We then placed a rigid sigmoidoscope and did rigid  sigmoidoscopy.  We put fluid in the pelvis.  We insufflated the colon  but there was a leak on the anterior wall noted.  We over-sewed this  with 3-0 silk suture and then reattached to the anastomosis and this  appeared to be watertight.  She has severe morbid obesity and diabetes  mellitus.  My concern was for proper wound healing given the thinness of  her tissues at our anastomoses.  I felt that a diverting loop ileostomy  would be warranted since there was some leakage at her anastomosis.  We  then irrigated out the pelvis and suctioned this out.  Hemostasis  was  excellent.  We removed all our packs and packing towels and found the  counts to be correct.  Through the right lower quadrant, we created a  right lower quadrant loop ileostomy.  There was some oozing from the  muscle  belly and I controlled this with cautery.  We then pulled out  some distal ileum which was quite difficult due to her morbid obesity,  but we were able to pull out a nicely loop of this.  This was held in  place with [place].  We then, at this point in time, examined the loop  ileostomy.  There was no evidence of any twisting.  An afferent and  efferent limb entered the opening and exited without difficulty or  compromise of it.  We then after moving all our packing through a  separate stab wound in the left lower quadrant, placed a 19 Blake drain  in the pelvis.  This was secured to the skin with 3-0 nylon.  We  suctioned out all our irrigation and closed the fascia with double-  stranded #1 PDS in a running fashion.  We irrigated the skin, closed it  with staples, and placed small wound wicks given her morbid obesity and  high risk for infection in this setting.  We then matured the loop  ileostomy with 3-0 Monocryl.  There was some oozing from around it which  I controlled with Surgicel and this did a very nice job of that.  The  ostomy was viable.  Appliance  was applied.  Dry dressings were applied.  The laparoscopic ports were  stable and closed as well.  All final counts of sponge, needle, and  instruments were found to be correct for this portion of the case for  the last time.  The patient was awoke and taken to recovery in  satisfactory condition.      Thomas A. Cornett, M.D.  Electronically Signed     TAC/MEDQ  D:  04/15/2008  T:  04/15/2008  Job:  [id number]   cc:   Gaspar Garbe, M.D.

## 2010-09-06 NOTE — Assessment & Plan Note (Signed)
[facility]                         GASTROENTEROLOGY OFFICE NOTE   NAME:[name], [name]                   MRN:          [id number]  DATE:04/03/2007                            DOB:          Jun 22, 1945    REFERRING PHYSICIAN:  Gaspar Garbe, M.D.   REASON FOR CONSULT:  Abdominal pain and dysphagia.   HISTORY OF PRESENT ILLNESS:  This is a 65 year old white female that I  have previously evaluated for GERD and suspected irritable bowel  syndrome.  She underwent upper endoscopy in April of 2005 that showed  mild gastritis and she was treated for gastritis and GERD with a proton  pump inhibitor.  She relates worsening problems with left lower quadrant  greater than right lower quadrant pain that has been episodic and lasts  approximately 5 to 10 minutes at a time.  The pain is unpredictable.  It  has been associated with gas, bloating and alternating diarrhea and  constipation.  She has had problems with reflux about 2-3 times per week  for the past several years and she has occasional difficulty swallowing  liquids with mild choking.  She notes no weight loss, melena,  hematochezia, nausea or vomiting.  She has a sister with ulcerative  colitis, no family members with colon cancer or colon polyps.   PAST MEDICAL HISTORY:  1. Hypertension.  2. Diabetes mellitus for 18 years.  3. Asthma for 20 years.  4. Arthritis.  5. Anxiety.  6. Sleep apnea.  7. Status post cholecystectomy in 1973.  8. Status post hysterectomy in 1972.  9. Status post bilateral mastectomies due to an increased risk of      breast cancer with cystic breast disease.  10.History of peptic ulcer disease.   CURRENT MEDICATIONS:  Listed on the chart, updated and reviewed.   MEDICATION ALLERGIES:  None known.   SOCIAL HISTORY AND REVIEW OF SYSTEMS:  Per the handwritten form.   PHYSICAL EXAMINATION:  Morbidly obese.  No acute distress.  Height 5  feet 2 inches, weight 300.8  pounds, blood pressure is 132/78, pulse 72  and regular.  HEENT:  Anicteric sclera, oropharynx clear.  CHEST:  Clear to auscultation bilaterally.  CARDIAC:  Regular rate and rhythm without murmurs appreciated.  ABDOMEN:  Soft with mild lower abdominal tenderness, left lower quadrant  greater than right lower quadrant to deep palpation, no rebound or  guarding, no palpable organomegaly, masses or hernias.  Normoactive  bowel sounds  RECTAL:  Deferred at time of colonoscopy.  NEUROLOGIC:  Alert and oriented x3.  Grossly nonfocal.   ASSESSMENT AND PLAN:  1. Left lower quadrant greater than right lower quadrant abdominal      pain associated with alternating diarrhea and constipation.  Rule      out colorectal neoplasms, inflammatory bowel disease,      diverticulosis and irritable bowel syndrome.  The risks, benefits      and alternatives to colonoscopy with possible biopsy and possible      polypectomy discussed with the patient and she consents to proceed.      This will be scheduled electively.  Begin  a high fiber diet with      adequate water intake.  Begin a fiber supplement on a daily basis.  2. Gastroesophageal reflux disease with dysphagia.  Primarily to      liquids.  Begin Prilosec OTC 20 milligrams by mouth every morning      along with antireflux measures.  The risks, benefits and      alternatives to upper endoscopy with possible biopsy and possible      dilation discussed with the patient, she consents to proceed.  This      was scheduled electively at the time of the colonoscopy.     Venita Lick. Russella Dar, MD, [facility]  Electronically Signed    MTS/MedQ  DD: 04/11/2007  DT: 04/12/2007  Job #: [id number]   cc:   Gaspar Garbe, M.D.

## 2010-09-06 NOTE — Discharge Summary (Signed)
[name], [name]            ACCOUNT NO.:  [id number]   MEDICAL RECORD NO.:  [id number]          PATIENT TYPE:  INP   LOCATION:  5151                         FACILITY:  [facility]   PHYSICIAN:  Rachael Fee, MD   DATE OF BIRTH:  [birth year]   DATE OF ADMISSION:  01/24/2008  DATE OF DISCHARGE:  01/26/2008                               DISCHARGE SUMMARY   DISCHARGE DIAGNOSIS:  Sigmoid diverticulitis, recurrent.   DISCHARGE MEDICATIONS:  1. Flagyl 500 mg 1 three times daily.  2. Cipro 500 mg 1 b.i.d.  3. The patient was to continue her home medications including Zoloft      100 mg daily.  4. Lantus insulin 200 units at bedtime.  5. Advair b.i.d.  6. Xanax p.r.n.  7. Benazepril/hydrochlorothiazide 20 mg daily.  8. Omeprazole 20 mg daily.   DISPOSITION:  Home in stable condition.   CONSULTATIONS REQUESTED:  General Surgery was consulted and the patient  was seen by Dr. Harriette Bouillon.   PROCEDURES:  None performed at this admission.   HOSPITAL COURSE:  [name] presented to [facility] emergency  department, January 24, 2008, with acute bilateral lower quadrant  abdominal pain which had started earlier that morning.  The pain was  similar to her previous episodes of diverticulitis except that it was  more severe at this time and involved the right lower quadrant where it  normally involved only the left lower quadrant.  The patient was known  to Dr. Russella Dar for history of recurrent diverticulitis.  In the emergency  department, a CT of the abdomen and pelvis with IV contrast revealed  diverticulosis of the sigmoid colon with pericolonic inflammation and a  small amount of extraluminal gas in the sigmoid mesocolon.  No evidence  for abscess collection was seen.  Also on CT, a small nodular density in  the right middle lobe of the lung was seen.  Hepatic steatosis was  identified.  The extrahepatic bile duct dilation at 1.3 was felt to be  stable.  Other findings included  hepatic steatosis as well as a small  nodular density in the right middle lobe of the liver.  The common bile  duct dilation of 1.3 cm appeared to be stable.  In the emergency  department, the patient's white count was normal.  She was afebrile.  She had some nausea but no vomiting.  Given the severity of the  patient's pain, we admitted her for IV fluids, analgesics, and  throughout the day, surgical consult was appropriate in this patient  with recurrent diverticulitis.  She was started on IV Flagyl and Cipro.  Surgery evaluated the patient while she was still in the emergency  department.  Surgery decided that they would hold off on surgery this  admission unless there was a perforation.  They planned to follow up  with [name] in the office for elective surgery.  By January 26, 2008, the patient's abdominal pain had significantly improved.  She was  tolerating a low-residue diet.  She was discharged home with oral  antibiotics and instructions to follow up with Dr. Russella Dar,  her primary  gastroenterologist.   Laboratory studies on the day of admission, the patient's CBC was normal  with an H&H of 12.9 and 38.1.  Her renal function was normal with a BUN  of 13 and creatinine of 0.56.  The liver function studies were normal on  the day of admission.      Willette Cluster, NP      Rachael Fee, MD  Electronically Signed    PG/MEDQ  D:  01/31/2008  T:  02/01/2008  Job:  [id number]

## 2010-09-06 NOTE — Discharge Summary (Signed)
[name], [name]            ACCOUNT NO.:  [id number]   MEDICAL RECORD NO.:  [id number]          PATIENT TYPE:  INP   LOCATION:  1522                         FACILITY:  [facility]   PHYSICIAN:  Maisie Fus A. Cornett, M.D.DATE OF BIRTH:  30-Apr-1945   DATE OF ADMISSION:  07/09/2008  DATE OF DISCHARGE:  07/13/2008                               DISCHARGE SUMMARY   ADMISSION DIAGNOSES:  1. History of sigmoid diverticulitis, status post sigmoid colectomy      and loop ileostomy.  2. Diabetes mellitus.  3. Morbid obesity.  4. Chronic obstructive pulmonary disease.   DISCHARGE DIAGNOSES:  1. History of sigmoid diverticulitis, status post sigmoid colectomy      and loop ileostomy.  2. Diabetes mellitus.  3. Morbid obesity.  4. Chronic obstructive pulmonary disease.   PROCEDURES PERFORMED:  Closure of loop ileostomy.   BRIEF HISTORY:  The patient is a 65 year old female admitted for closure  of her loop ileostomy.  This was created after a sigmoid colectomy for  diverticulitis.   HOSPITAL COURSE:  Her hospital course was unremarkable.  Her ileus  resolved over the next 2 to 3 days.  Diet was advanced.  Her bowel  function returned. She had a wound vac over her right lower quadrant  wound and this was changed during her hospital stay.  She was tolerating  her diet with no nausea or vomiting.  She was ambulating and was  tolerating her vac and was moving her bowels.  She was discharged home  in satisfactory condition on postoperative day #4.   DISCHARGE INSTRUCTIONS:  She will follow up in 1 week.  The instructions  have been written for her wound vac to be changed by home health every 3  days.  I have given her a script for Roxicodone 10 to 20 mg p.o. q.4-6h.  p.r.n. pain and she will resume her home medications as outlined in the  medicine reconciliation list.   CONDITION ON DISCHARGE:  Improved.      Thomas A. Cornett, M.D.  Electronically Signed     TAC/MEDQ  D:  07/13/2008   T:  07/13/2008  Job:  [id number]

## 2010-09-06 NOTE — Consult Note (Signed)
[name], [name]            ACCOUNT NO.:  [id number]   MEDICAL RECORD NO.:  [id number]          PATIENT TYPE:  INP   LOCATION:  5151                         FACILITY:  [facility]   PHYSICIAN:  [name], M.D.DATE OF BIRTH:  03/28/1946   DATE OF CONSULTATION:  01/24/2008  DATE OF DISCHARGE:                                 CONSULTATION   TIME OF CONSULTATION:  1600 p.m.   CONSULTING PHYSICIAN:  [name] Fus A. Cornett, MD.   PRIMARY CARE PHYSICIAN:  [name] Garbe, MD.   REASON FOR CONSULTATION:  Recurrent diverticulitis.   HISTORY OF PRESENT ILLNESS:  Ms. [name] is a 65 year old white female  who has a history of recurrent diverticulitis in her sigmoid colon.  She  states that she has had approximately 4 episodes this year with one  resulting in hospitalization.  She also has a history of diabetes  mellitus, anxiety, hypertension, and depression, who presented to the  emergency department this morning with abdominal pain.  The patient  states she started having abdominal pain with an acute onset around 7  a.m. this morning, and it progressively worsened over the next several  hours where the patient felt like she could not go to work and therefore  presented to the emergency department.  She states that she is  nauseated, however, has not had any episodes of emesis.  She says her  last bowel movement was yesterday and this was normal with no blood  seen.  She also admits to having a small amount of loose stool today  that also did not have any blood in it.  Back in February of this year,  when the patient had her one hospitalization for diverticulitis after  she was sent home, she had an upper endoscopy as well as colonoscopy  done by Dr. Russella [name] that she says was normal except for they felt that she  had H. pylori which they later treated.  A record of these are not on  the chart and are not on the computer.  So I did not have the exact  report.  Otherwise, once the  patient came to the emergency department, a  CT scan was obtained which showed acute diverticulitis of the sigmoid  colon without formation of an abscess.  Her white blood cell count is  6200.  At this time, we were consulted to see the patient for multiple  recurrent episodes of diverticulitis.   REVIEW OF SYSTEMS:  See HPI.  Otherwise, all other systems are currently  negative.   FAMILY HISTORY:  Noncontributory.   PAST MEDICAL HISTORY:  1. Recurrent diverticulitis.  2. Diabetes mellitus.  3. Asthma.  4. Anxiety.  5. Depression.  6. Hypertension.  7. History of H. pylori.  8. Heartburn.   PAST SURGICAL HISTORY:  1. Status post partial hysterectomy.  The patient still has her      ovaries.  2. Right total knee arthroplasty.  3. Tonsillectomy.  4. Appendectomy.  5. Cholecystectomy.  Both the appendectomy and the cholecystectomy      were open.   SOCIAL HISTORY:  The patient is divorced.  She does have 5 children.  She is a Best boy at [employer].  Otherwise, she  does not smoke, use alcohol, or any other drugs.   ALLERGIES:  NKDA.   MEDICATIONS:  1. Lantus 200 units at bedtime.  2. Advair, unsure of the dose, 2 times a day.  3. Xanax, unsure of the dose, p.r.n.  4. Zoloft 100 mg daily.  5. Benazepril hydrochloride 20 mg daily.  6. Omeprazole 20 mg b.i.d.   PHYSICAL EXAMINATION:  GENERAL:  Ms. [name] is a 65 year old white  female who is morbidly obese; however, she is very pleasant and  currently in no acute distress.  VITAL SIGNS:  Temperature 97.3, blood pressure 120/51, pulse 76,  respirations 20.  EYES:  Sclerae noninjected.  Pupils are equal, round, and reactive to  light.  EARS, NOSE, MOUTH, AND THROAT:  Ears and nose with no obvious masses or  lesions.  No rhinorrhea.  Mouth is pink and moist.  Throat shows no  exudate.  NECK:  Supple.  Trachea is midline.  No thyromegaly.  HEART:  Regular rate and rhythm.  Normal S1, S2.  No murmurs,  gallops,  or rubs are noted.  +2 bilateral carotid, radial, and pedal pulses  bilaterally.  LUNGS:  Clear to auscultation bilaterally with no wheezes, rhonchi, or  rales noted.  Respiratory effort is nonlabored.  ABDOMEN:  Soft, morbidly obese with decreased bowel sounds.  The patient  is diffusely tender with a greater amount of pain in her bilateral lower  quadrant which is greater than her bilateral upper quadrant.  However,  the most focal pain that she has is in her left lower quadrant.  Otherwise, the patient has a large, right upper quadrant oblique scar  from where her open cholecystectomy was done as well as a right lower  quadrant oblique scar from her open appendectomy.  Otherwise, no masses  or hernias were noted.  MUSCULOSKELETAL:  All 4 extremities were symmetrical with no cyanosis or  clubbing.  However, the patient does have +1 pitting edema in her  bilateral lower extremities.  SKIN:  Warm and dry without any obvious masses, lesions, or rashes.  NEUROLOGIC:  Cranial nerves II through XII appeared to be grossly  intact.  PSYCH:  The patient is alert and oriented x3 with an appropriate affect.   LABORATORY DATA:  White blood cell count is 6200, hemoglobin 12.9,  hematocrit 38.1, platelet count 230,000, neutrophils were 65%.  Sodium  140, potassium 3.9, glucose 153, BUN 13, creatinine 0.56.  LFTs were all  normal.   DIAGNOSTICS:  CT of the abdomen and pelvis revealed acute diverticulitis  including a sigmoid colon without evidence of an abscess collection.   IMPRESSION:  1. Recurrent sigmoid diverticulitis without abscess.  2. Diabetes mellitus.  3. Hypertension.  4. Anxiety.  5. Depression.   PLAN:  At this time, we agree with the use of Cipro and Flagyl to treat  the patient's diverticulitis.  This has been due to medications used in  the past that has seemed to help the patient.  Also, the patient will be  started on a clear liquid diet per GI.  This is okay  with [place]; however,  we would recommend that if the patient has increase in pain with eating  her clear liquids, we may want to have the patient off to bowel rest  except for ice chips and meds for a while.  Otherwise, at this time, I  have discussed  surgical options with the patient.  I have informed her  that typically when the patient has acute diverticulitis with the amount  of inflammation that is in the abdomen unless the patient has a more  urgent reason for surgical intervention such as a colonic perforation,  we will try to treat the patient conservatively and get them over this  episode and bring them back on a more elective basis to have a sigmoid  colon resection.  I have explained to the patient that if, for some  reason, she does have a more urgent need for surgical intervention on  this admission, that more than likely, she would end up with a sigmoid  colectomy and colostomy.  However, if the patient was able to  conservatively be treated and come back on an elective basis, her  incidence of having a colostomy is greatly decreased.  The patient  understands this and actually would prefer to hold off a surgical  intervention and have it done on an elective basis for financial  reasons.  However, at this time, she is aware that if she does need  surgery urgently this admission, she is okay with that.  Otherwise at  this time, we will continue to follow with you.   Thank you for this consult.      Letha Cape, PA      [name], M.D.  Electronically Signed    KEO/MEDQ  D:  01/24/2008  T:  01/25/2008  Job:  [id number]   cc:   Rachael Fee, MD  [name], M.D.

## 2010-09-06 NOTE — H&P (Signed)
[name], [name] NO.:  [id number]   MEDICAL RECORD NO.:  [id number]          PATIENT TYPE:  INP   LOCATION:  5151                         FACILITY:  [facility]   PHYSICIAN:  Rachael Fee, MD   DATE OF BIRTH:  03/22/1946   DATE OF ADMISSION:  01/24/2008  DATE OF DISCHARGE:                              HISTORY & PHYSICAL   HISTORY OF PRESENT ILLNESS:  The patient is a 65 year old female  followed by Dr. Russella Dar in our office for history of recurrent  diverticulitis and history of GERD.  The patient acutely developed  bilateral lower quadrant abdominal pain this morning at 7 a.m.  Pain  just like previous episodes of diverticulitis except it is worse this  time and involves the right lower quadrant where as it normally involves  only her left lower quadrant.  The patient had chills last night.  She  has had some nausea without vomiting.  This is apparently her fourth  episode of diverticulitis this year.  She has not seen a Careers adviser as of  yet.  The patient is currently comfortable on Dilaudid.  Her abdominal  pain this morning was not positional nor it was related to defecation.  Bowel movements are soft.  No hematochezia.  Her last colonoscopy with  Dr. Russella Dar was in February 2009, and that was done for evaluation of  microcytic anemia.  Prep was excellent and the extent of the exam was to  the cecum.   FINDINGS:  Diverticulosis and a 3 mm sigmoid polyp, the nature of the  polyp not known at this immediate time.  The patient also had an EGD on  the same day with findings of erosive gastritis.  One of the indications  for doing the EGD was for dysphagia.  The patient has a history of acid  regurgitation and heartburn.  On her EGD, her esophagus was completely  normal.  No strictures or esophagitis was seen.  She continues to have  problems with acid regurgitation and heartburn.  Her proton pump  inhibitor was recently increased to twice a day and she has gotten  some  relief in her symptoms.  She still continues to have some solid food  dysphagia and occasionally chokes on water.   PAST MEDICAL HISTORY:  1. Diverticulitis.  2. GERD.  3. Hypertension.  4. Depression.  5. Anxiety.  6. Diabetes.  7. Hysterectomy.  8. Cholecystectomy.   SOCIAL HISTORY:  Former tobacco use.  No alcohol use.   HOME MEDICATIONS:  1. Lantus insulin 200 units at night.  2. Advair b.i.d.  3. Zoloft 100 mg daily.  4. Xanax 0.5 mg as needed.  5. Omeprazole 20 mg b.i.d.  6. Benazepril 10 mg daily.   ALLERGIES:  No known drug allergies.   REVIEW OF SYSTEMS:  Positive for chills last night.  Positive for  chronic shortness of breath.  Positive for cough.  Positive for  dysphagia and nausea.  Positive for lower extremity swelling.  Although,  the review of systems reviewed and negative other than were noted above.   PHYSICAL ASSESSMENT:  VITAL SIGNS:  Temp 97.9, pulse 82, blood pressure  139/78, respirations 20, and O2 saturation 94% on room air.  GENERAL:  [name] is a pleasant 65 year old female lying in bed in  no acute distress.  HEENT:  Head atraumatic.  Conjunctivae pink.  No icterus.  No oral  lesions.  Tongue moist.  NECK:  Supple.  No masses.  No lymphadenopathy.  CARDIAC:  Regular rate and rhythm.  No appreciable murmurs.  RESPIRATORY:  Bilateral lung field clear.  Normal respiratory effort.  ABDOMEN:  Soft, obese, and nondistended.  Positive bowel sounds.  Left  lower quadrant and right lower quadrant tenderness to palpation.  No  peritoneal signs.  SKIN:  Positive spider nevi to the chest and upper back.  No rashes.  EXTREMITIES:  Trace bilateral lower extremity edema.  Bilateral palmar  erythema.  PSYCHIATRIC:  The patient is pleasant and cooperative.  NEUROLOGIC:  Alert and oriented.   IMPRESSION:  1. Recurrent diverticulitis, fourth episode this year.  CAT scan in      the ER shows sigmoid diverticulitis.  No abscesses or free air.       Normal white count.  The patient is afebrile.  Pain worse this      episode and also involves the right lower quadrant this time.  2. Gastroesophageal reflux disease and chronic dysphagia with normal      esophagogastroduodenoscopy, February 2009.  Dysphagia possibly      secondary to dysmotility.  Acid reflux and pyrosis suboptimally      controlled on b.i.d. proton pump inhibitor.  3. Hypertension/diabetes/anxiety/depression/obesity.   PLAN:  We will admit for IV antibiotics, IV fluids, clear liquids,  analgesics, and surgical evaluation.  Home Accu-Cheks before meals with  sliding scale insulin coverage.      [name], [name]      Rachael Fee, MD  Electronically Signed    PG/MEDQ  D:  01/24/2008  T:  01/25/2008  Job:  [id number]

## 2010-09-06 NOTE — Assessment & Plan Note (Signed)
[facility]                         GASTROENTEROLOGY OFFICE NOTE   NAME:[name], [name] BRUNELLE                   MRN:          [id number]  DATE:06/05/2007                            DOB:          02/24/1946    This is a return office visit for diverticulitis.  [name] was  hospitalized on May 30, 2007 for acute diverticulitis.  She was  discharged on June 02, 2007.  Please see the history and physical  exam for details.  A CT scan of the abdomen and pelvis dated May 30, 2007 revealed acute sigmoid colon diverticulitis and no other  abnormalities.  She has done very well since discharge and has no  abdominal complaints whatsoever.  She feels substantially improved.  Colonoscopy and upper endoscopy had been previously scheduled for  February 20 and I think it is reasonable to proceed given her rapid  improvement.  Blood work from her hospitalization revealed a mild  microcytic anemia. Her hemoglobin was 11.1 and MCV was 77.7.  She has  not noted dysphagia or reflux symptoms since beginning Prilosec OTC in  December.   CURRENT MEDICATIONS:  Listed on the chart, updated and reviewed.   MEDICATION ALLERGIES:  None known.   PHYSICAL EXAMINATION:  Obese, white female.  No acute distress.  Blood  pressure 110/70, pulse 76 and regular.  CHEST:  Clear to auscultation bilaterally.  CARDIAC:  Regular rate and rhythm without murmurs.  ABDOMEN:  Soft, nontender, nondistended.  Normoactive bowel sounds.  No  palpable organomegaly, masses or hernias.   ASSESSMENT/PLAN:  1. Acute sigmoid colon diverticulitis.  Her symptoms have responded      well to antibiotics and she is advised to complete her 10 day      course of oral Cipro and Flagyl.  Rule out colorectal neoplasms.      Risks, benefits and alternatives to colonoscopy, possible biopsy      and possible polypectomy discussed with the patient.  She consents      to proceed.  2. Microcytic  anemia.  Obtain standard blood work for anemia      evaluation.  Rule out iron deficiency.  Colonoscopy as above for      further evaluation.  3. GERD with dysphagia to liquids.  Her symptoms have resolved on      Prilosec and antireflux measures.  Rule out esophagitis and other      disorders.  Risks, benefits and alternatives to upper endoscopy      with possible biopsy and possible dilation discussed with the      patient, and she consents to proceed.  This will be scheduled      electively at the time of her colonoscopy.     [name]. [name] Dar, MD, [facility]  Electronically Signed   MTS/MedQ  DD: 06/05/2007  DT: 06/06/2007  Job #: [id number]   cc:   [name], M.D.

## 2010-09-06 NOTE — Discharge Summary (Signed)
[name], [name]            ACCOUNT NO.:  [id number]   MEDICAL RECORD NO.:  [id number]          PATIENT TYPE:  INP   LOCATION:  5511                         FACILITY:  [facility]   PHYSICIAN:  Iva Boop, MD,FACGDATE OF BIRTH:  Jan 09, 1946   DATE OF ADMISSION:  05/30/2007  DATE OF DISCHARGE:  06/02/2007                               DISCHARGE SUMMARY   ADMITTING DIAGNOSES:  1. Acute sigmoid diverticulitis, recurrent.  2. Type 2 insulin-dependent diabetes mellitus.  3. Obesity, weight is 135 kg.  4. Osteoarthritis.  5. Asthma.  6. Status post cholecystectomy, appendectomy, and hysterectomy.  7. Status post prophylactic bilateral mastectomies done secondary to      family history of breast cancer.  She is status post breast      implants.  8. Apparent sleep apnea.  9. Gastroesophageal reflux disease.   DISCHARGE DIAGNOSES:  1. Acute sigmoid diverticulitis, resolving.  2. Mild borderline microcytic anemia.   BRIEF HISTORY:  This is a 64 year old white female whose primary care MD  is Dr. Wylene Simmer.  She had recently been established for GI care with Dr.  Russella Dar.  She had not yet undergone any colonoscopy or endoscopy.  The  patient was seen in the ER by Dr. Lina Sar on behalf of Dr. Claudette Head.  The patient was complaining of 1 week of left lower quadrant  pain, which had become progressive.  The evening prior to office visit,  it was 20 on a scale of 10 and had become constant, fever documented to  101 at home along with chills.  The week prior she had seen Dr. Wylene Simmer  and there was some question as to a UTI because she had bladder  pressure.  UA was negative and she was started on a 7-day course of 500  b.i.d. Cipro for possible diverticulitis.  Despite the antibiotics, her  symptoms had not improved and she still is having fever.  She denied  diarrhea, melena, and hematochezia.  In the emergency room, white blood  cell count was 12.4 and CT scan confirmed acute  sigmoid diverticulitis  without abscess or perforation.  She was admitted for IV antibiotics,  pain control, and bowel rest.   The patient had been scheduled for a colonoscopy in the past, but had  cancelled and this had been rescheduled for June 14, 2007.   CONSULTATIONS:  None.   LABORATORY DATA:  White blood cell count initially 12.4, at discharge it  was 5.7.  Hemoglobin 11.1.  MCV 77.7, platelets 207,000.  Initial  differential did show a left shift with increased neutrophils.  Sodium  137, potassium 4.0.  Chloride 102, CO2 of 24.  Glucose 183.  BUN 8,  creatinine 0.6, total bilirubin 0.6, alkaline phosphatase 93.  AST 15,  ALT 18.  Albumin 3.6, total protein 7.4.  Lipase 20.  Urinalysis showed  a small amount of leukocyte esterase, no nitrites, few epithelial cells,  rare bacteria, 3-6 white blood cells and 0-2 red blood cells per high-  powered field.  CT scan showed acute sigmoid diverticulitis with edema,  wall thickening, and pericolonic inflammation at the  sigmoid colon.  There is some incidental distal aortoiliac atherosclerosis noted.  An  incidental small 10 mm bibasilar pulmonary blood versus cysts were also  identified along with degenerative changes in the lower thoracic spine.   HOSPITAL COURSE:  The patient was started on IV Cipro and IV Flagyl.  She was allowed clear liquids only.  By the following day, the patient  was feeling less pain, although she still had tenderness on abdominal  exam.  By hospital day 2 and 3, she was showing further improvement.  She finally had a bowel movement on hospital day 2, which was a loose  stool.  She was not requiring significant amounts of available morphine  or Zofran.   Stool for Clostridium difficile toxin was ordered, but she did not have  another stool, so it was never sent.  Ultimately she was tolerating a  low-residue diabetic diet.  Incidentally, her CBGs ranged from 141 up to  202.  The patient was ultimately  discharged to home in stable and much  improved condition with just some mild left lower quadrant tenderness.  She was to complete a 10-day course of oral Cipro and Flagyl.  Dr.  Leone Payor also gave prescription for Vicodin to be used on a p.r.n. basis.   DIET AT DISCHARGE:  Regular.   FOLLOWUP:  She was to follow up with Dr. Russella Dar in about 2 weeks and she  was to call the office for appointment on Monday.   MEDICATIONS AT DISCHARGE:  1. Advair twice daily as before.  2. Albuterol inhaler as needed.  3. Xanax 0.5 mg three times daily.  4. Humalog insulin 10 units before meals.  5. Lantus insulin 20 units at bedtime.  6. Vicodin 5/500 one every 4-6 hours as needed for pain.  7. Cipro 500 mg twice daily for 10 days.  8. Flagyl 250 mg four times daily for 10 days.      Jennye Moccasin, PA-C      Iva Boop, MD,FACG  Electronically Signed    SG/MEDQ  D:  09/02/2007  T:  09/03/2007  Job:  [id number]   cc:   Gaspar Garbe, M.D.  Venita Lick. Russella Dar, MD, [facility]

## 2010-09-06 NOTE — Discharge Summary (Signed)
NAMEALANIE, SYLER            ACCOUNT NO.:  [id number]   MEDICAL RECORD NO.:  [id number]          PATIENT TYPE:  INP   LOCATION:  5159                         FACILITY:  [facility]   PHYSICIAN:  Maisie Fus A. Cornett, M.D.DATE OF BIRTH:  22-Nov-1945   DATE OF ADMISSION:  04/15/2008  DATE OF DISCHARGE:  04/22/2008                               DISCHARGE SUMMARY   ADMITTING DIAGNOSIS:  Acute-on-chronic sigmoid diverticulitis.   DISCHARGE DIAGNOSIS:  Acute-on-chronic sigmoid diverticulitis.   PROCEDURES PERFORMED:  Sigmoid colectomy with diverting loop ileostomy,  rigid sigmoidoscopy and diagnostic laparoscopy.   BRIEF HISTORY:  The patient is a 65 year old female who has had  recurrent multiple bouts of acute diverticulitis managed medically.  She  also has significant obesity as well.  She elected to have a sigmoid  colectomy due to her multiple attacks which became much more frequent.  She still has some residual pain issues as well and was felt that  sigmoid colectomy would be the best way to treat her at this point in  time.  She presents for elective sigmoid colectomy for history of  multiple attacks of diverticulitis.  Please see operative note for  details of the procedure.   HOSPITAL COURSE:  The patient was admitted.  She had an ileus for the  first 3 to 4 postoperative days.  Her ileostomy was somewhat retracted  and ecchymotic looking.  She had a large hematoma around this well.  This began to put out after postop day #3 and 4.  Her pain improved.  Her staples were left in place.  Her diet was advanced over the next 3  to 4 postoperative days.  She was ambulating, tolerating her pain  medicine by mouth and had no fever, no chills.  She was discharged home  postoperative day #7 in satisfactory condition tolerating soft diet,  ambulating and tolerating her p.o. pain medicine.   DISCHARGE INSTRUCTIONS:  She will come back and see me in 1-2 weeks.  We  have set her up to speak  with home health about wound ostomy appliances.  She will be given prescription for Percocet 1-2 tabs q. 4 p.r.n. pain  and resume her home meds as outlined in her medical reconciliation list  contained in the chart.   CONDITION AT DISCHARGE:  Improved.      Thomas A. Cornett, M.D.  Electronically Signed     TAC/MEDQ  D:  05/26/2008  T:  05/26/2008  Job:  [id number]

## 2010-09-09 NOTE — H&P (Signed)
[name], [name]            ACCOUNT NO.:  [id number]   MEDICAL RECORD NO.:  [id number]          PATIENT TYPE:  INP   LOCATION:  NA                           [facility]ACILITY[facility]   PHYSICIAN:  Erasmo Leventhal, M.D.DATE OF BIRTH:  [birth year]   DATE OF ADMISSION:  DATE OF DISCHARGE:                                HISTORY & PHYSICAL   CHIEF COMPLAINT:  Right knee end-stage osteoarthritis.   HISTORY OF PRESENT ILLNESS:  This is a 65 year old lady well known to [place]  with a history of end-stage osteoarthritis of the right knee.  She has  failed conservative measures to control her pain.  After discussion of  treatment options, risks, benefits, and after-care, she now opts for total  knee arthroplasty of the right knee.  Questions were invited and answered.  We will have Dr. Deneen Harts group follow her postoperatively for management  of her diabetes.  Please note that her urinalysis preop, which was not an  I&O cath, did show some squamous epithelial cells and a rare bacteria, but  she has no symptoms at all referable to a urinary tract infection.   PAST MEDICAL HISTORY:  Questionable allergy to VICODIN with itching of the  hands and feet after she has taken it for several days, otherwise no  allergies known.   CURRENT MEDICATIONS:  1.  Zoloft 200 mg p.o. q.a.m.  2.  Diovan 340 mg p.o. q.a.m.  3.  Xanax 0.5 mg p.o. t.i.d.  4.  Lantus 86 units subcu q.h.s.  5.  Protonix 40 mg p.o. q.a.m.  6.  Advair 1 puff b.i.d.   PAST SURGICAL HISTORY:  1.  Hysterectomy.  2.  Cholecystectomy.  3.  Knee arthroscopy.  4.  Bilateral prophylactic mastectomies with breast implants.   Serious medical illnesses, include diabetes, hypertension, sleep apnea,  GERD, and asthma.   FAMILY HISTORY:  Positive for coronary artery disease and hypertension,  diabetes, and cancer.   SOCIAL HISTORY:  The patient is divorced.  She lives at home.  She works in  data entry.  She does not smoke or drink.   REVIEW OF SYSTEMS:  CENTRAL NERVOUS SYSTEM:  Negative for headache, blurred  vision, or dizziness.  Positive for anxiety.  PULMONARY:  Positive for  exertional shortness of breath.  Negative for BNT and orthopnea, and  positive for history of asthma.  CARDIOVASCULAR:  Negative for chest pain  and palpitations.  Positive for shortness of breath with exertion and  hypertension.  GI:  Positive for GERD.  Negative for ulcers or hepatitis.  GU:  Negative for urinary tract difficulty.  MUSCULOSKELETAL:  Positive as  in HPI.   PHYSICAL EXAMINATION:  VITAL SIGNS:  BP 128/90, respirations 20, pulse 86  and regular.  GENERAL APPEARANCE:  This is an obese lady in no acute distress.  HEENT:  Head is normocephalic.  Nose patent.  Ears patent.  Pupils are  equal, round and reactive to light.  Throat without injection.  NECK:  Supple without adenopathy.  Carotids 2+ without bruits.  LUNGS:  Clear to auscultation.  No rales or rhonchi.  Respirations 18.  HEART:  Regular rate and rhythm at 86 beats per minute without murmur.  ABDOMEN:  Soft with active bowel sounds.  No masses or organomegaly.  EXTREMITIES:  The right knee with a slight valgus deformity.  She has 0-110  degree range of motion with crepitation throughout the range of motion.  Sensation and circulation are intact.  Dorsalis pedis and posterior tibialis  pulses are 1+.  NEUROLOGIC:  Patient alert and oriented to time, place, and person.  Cranial  nerves II-XII are grossly intact.   X-rays show end-stage osteoarthritis, right knee.   IMPRESSION:  End-stage osteoarthritis, right knee.   PLAN:  Total knee arthroplasty, right knee.      SJC/MEDQ  D:  01/18/2004  T:  01/18/2004  Job:  [id number]

## 2010-09-09 NOTE — Consult Note (Signed)
[name], [name]            ACCOUNT NO.:  [id number]   MEDICAL RECORD NO.:  [id number]          PATIENT TYPE:  INP   LOCATION:  0482                         FACILITY:  [facility]   PHYSICIAN:  Gaspar Garbe, M.D.DATE OF BIRTH:  15-Sep-1945   DATE OF CONSULTATION:  01/20/2004  DATE OF DISCHARGE:                                   CONSULTATION   REASON FOR CONSULTATION:  Medical management, diabetes management postop.   HISTORY OF PRESENT ILLNESS:  The patient is a 65 year old white female well  known to me from diabetes care for which she has been under recently good  control.  It was made known to me that she was going to be having a right  total knee replacement.  I performed preoperative consultation on her as  well.  Please see dictation of this by letter form for full details.  The  patient underwent her right total knee replacement on January 19, 2004,  and I am asked to see her for monitoring and treatment for her diabetes  postoperatively as well as managing any medical problems that come along.  This morning, she feels reasonably well, although she is tired as she has  been on the Glucomander protocol overnight and has not had a lot of sleep.  She denies any new symptoms other than pain from the local incision area  which seems to have been improved over the course of the evening.  She did  have a slight fever at 2:00 this morning but does not notice any other new  symptoms.  Currently has a Foley catheter in place but is not having any  burning with urination.  Her respiratory status is that she is using her  CPAP for her obstructive sleep apnea and is not actually coughing up  anything.   ALLERGIES:  1.  Question that ALTACE causes diarrhea.  2.  Question VICODIN, although she has tolerated it in the past.   MEDICATIONS:  1.  Normal home regimen.  2.  Metformin 1,000 mg b.i.d.  3.  Zoloft 200 mg daily.  4.  Xanax 0.5 mg t.i.d.  5.  Advair 500/50 b.i.d.  6.   Albuterol MDI p.r.n.  7.  Lantus insulin 80-85 units subcutaneously at bedtime.  8.  Diovan 320 mg daily.  9.  Protonix 40 mg daily.   PAST MEDICAL HISTORY:  1.  Osteoarthritis.  2.  Diabetes mellitus type 2 since 1995.  3.  Mild COPD.  4.  Obstructive sleep apnea.  5.  Anxiety.  6.  Depression.  7.  GERD.   PAST SURGICAL HISTORY:  1.  Right total knee done yesterday per Dr. Thomasena Edis.  2.  Appendectomy in 1969.  3.  Cholecystectomy in 1972.  4.  Partial hysterectomy in 1973.  5.  Breast implants put in in 1976 and removed in 1996 secondary to pain.  6.  Right knee arthroscopy in 2003.   SOCIAL HISTORY:  The patient lives in [place] by herself.  She is single,  has five children.  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  Has a 35 pack-  year smoking history and quit in 1993.  Does not currently drink alcohol.   FAMILY HISTORY:  Mother died at age 72 of breast cancer.  Father died at age  89 of a presumed MI.  She has two brothers and four sisters.  Brothers have  considerable heart histories.  She has one sister who died of a brain  aneurysm.   REVIEW OF SYSTEMS:  The patient is somewhat groggy.  Currently is having  some mild pain of her right knee, and is just tired from her long day  yesterday but does not have any complaints of dysuria, shortness of breath,  chest pain, headaches, or GI symptoms, although she does not currently feel  hungry.  All other symptoms are negative.   ADVANCE DIRECTIVES:  The patient is a full code.   PHYSICAL EXAMINATION:  TEMPERATURE:  100.  PULSE:  78.  RESPIRATORY RATE:  18.  BLOOD PRESSURE:  119/59.  SATURATIONS:  92% on room air.  GENERAL:  In no acute distress.  HEENT:  Normocephalic, atraumatic.  PERRLA.  EOMI.  ENT is within normal  limits.  NECK:  Supple.  No lymphadenopathy, JVD or bruits.  HEART:  Regular rate and rhythm.  Patient has a grade 1/6 systolic ejection  murmur as noted before.  Pulses are 2+ and equal  bilaterally.  LUNGS:  Clear to auscultation bilaterally.  No wheezes or rhonchi are  appreciated this morning.  ABDOMEN:  Soft, nontender.  Normoactive bowel sounds.  No  hepatosplenomegaly.  EXTREMITIES:  The patient's right knee is currently dressed, appears to be  clean.  NEUROLOGIC:  Oriented x 3.  Cranial nerves II-XII are intact.  MUSCULOSKELETAL:  The patient does not have any CVA tenderness on exam.   PREOPERATIVE LABORATORIES:  White count 6.6, hemoglobin 12.6, hematocrit  37.5, platelets 252.  BUN and creatinine are 9 and 0.6, respectively, with a  glucose of 155, with normal LFTs, normal clotting times, and a urinalysis  which was mildly LE positive, with 3-6 white blood cells.  Labs today showed  CBG 114.  Normal renal indices.  A slightly elevated white count of 13.4.   ASSESSMENT AND PLAN:  1.  Diabetes mellitus type 2.  The patient is on Glucomander currently.      Going to be switching her over to her usual regimen, and will be giving      her Lantus in the evening, but will bridge her with 35 units of NPH      today plus an insulin resistance sliding scale which is appropriate      given her amount of insulin usage.  She is also off metformin, and we      will continue to hold this until her diet improves, as some folks have      diarrhea and nausea with it.  Her CBGs will be followed a.c. and at      bedtime.  They will take them again at 8:00 this morning and again at      10:00, as there may be a period of lag between the start of the NPH and      discontinuation of her Glucomander.  Sliding scale will be used to cover      this.  2.  Hypertension.  Controlled currently on Diovan.  3.  Obstructive sleep apnea.  The patient has her CPAP and must use it while      she is sleeping, especially if she naps during the  day.  4.  COPD.  Will continue her on her current inhalers.  If she continues      fevers or has worsening respiratory function, will get chest x-ray. 5.   Anxiety and depression.  Continue her Zoloft and Xanax at appropriate      doses.  6.  Low-grade fever.  Urinalysis has been ordered.  If this is positive,      will treat her presumptively.  Especially now that she has an indwelling      Foley catheter, symptoms may be somewhat different.  If fevers continue,      will consider either drug fever or workup with chest x-ray.   Will follow medically in the hospital.  Thank you for allowing me to consult  on this patient.     Rich   RWT/MEDQ  D:  01/20/2004  T:  01/20/2004  Job:  [id number]

## 2010-09-09 NOTE — Discharge Summary (Signed)
[name], [name]            ACCOUNT NO.:  [id number]   MEDICAL RECORD NO.:  [id number]          PATIENT TYPE:  INP   LOCATION:  0482                         FACILITY:  [facility]   PHYSICIAN:  Erasmo Leventhal, M.D.DATE OF BIRTH:  1946/04/07   DATE OF ADMISSION:  01/19/2004  DATE OF DISCHARGE:  01/25/2004                                 DISCHARGE SUMMARY   ADMISSION DIAGNOSIS:  End-stage osteoarthritis of the right knee.   POSTOPERATIVE DIAGNOSIS:  End-stage osteoarthritis of the right knee.   OPERATION:  Total knee arthroplasty of the right knee.   BRIEF HISTORY:  This is a 65 year old lady well known to [place] with a history  of end-stage osteoarthritis of the right knee.  She has failed conservative  measures to control the pain.  After discussion of treatment, risks,  benefits and options, she is now scheduled for total knee arthroplasty.  She  does have diabetes and is followed by Dr. Wylene Simmer and will be followed  postoperatively by the group.  Questions are invited and answered.   LABORATORY VALUES:  Admission CBC within normal limits.  Admission CMET  showed a glucose high at 155, otherwise normal.  Admission PT/PTT were  within normal limits.  Her hemoglobin and hematocrit reached a low of 8.4  and 25 on October 1.  She was transfused at discharge and had a hemoglobin  and hematocrit of 9.9 and 29.9.  Her glucoses were mildly elevated  throughout admission and were controlled by the medical service.  Discharge  PT showed the PT to be 19.5 with an INR of 2.0.   HOSPITAL COURSE:  Patient tolerated the operative procedure well.  On her  first postoperative day, vital signs were stable.  The drain was removed  without difficulty with only moderate drainage.  Circulation and sensation  were intact in the leg.  Calves were negative.  Lungs were clear.  Bowel  sounds were normal.   On the second postoperative day, the dressing was changed.  The wound was  benign.  She had  decreased breath sounds at the bases and overnight had O2  sats at 96% on 4 liters.  A chest x-ray was obtained by the medical service  and showed no acute disease.  Her oxygen sats came back up on a lower dose  of oxygen without difficulty.  During that time, her calves were negative.  Bowel sounds were sluggish.  Heart sounds were normal.  Lungs other than the  decreased breath sounds at the bases were clear.  She also was noted in her  urinalysis to have a large amount of blood, small amount of leukocytes and  was started on a three-day course of antibiotics for her presumed UTI by the  medical service.   On the 3rd postoperative day, she continued to do well.  Vital signs were  stable.  She was afebrile, although she had a temperature the night before  to a max of 100.0.  Hemoglobin and hematocrit were stable.  The wound  continued to remain benign.  Calves were negative.  Plans were made for  discharge on Saturday  if she continued to do well.   On the 4th postoperative day, she was noted to have some orthostatic changes  with dizziness with standing.  Her hemoglobin and hematocrit were down to  8.4, and she was subsequently transfused 2 units of packed cells.  Other  than that, her vital signs remained stable.  No active signs of bleeding,  and she was afebrile.   The following day, her vital signs were stable.  She was afebrile.  Hemoglobin and hematocrit post transfusion were 9.9 and 29.1.  Mild swelling  was noted in the legs without evidence of infection.  No calf tenderness was  noted.  She was kept overnight to make sure that her hemoglobin and  hematocrit remained stable.   On the 6th postoperative day with vital signs stable, afebrile now, but a  temp overnight to 100.8 with hemoglobin of 9.9 and hematocrit of 29.9.  WBC  normal.  Dressing was changed.  The wound was benign.  Mild swelling was  noted in the leg.  No calf tenderness.  No cords.  Negative Homans' sign.  The  lungs were clear.  Bowel sounds were sluggish.  She was seen by the  medical service and felt, from their perspective to be ready for discharge,  and she was subsequently discharged home for followup in the office.   CONDITION ON DISCHARGE:  Improved.   DISCHARGE MEDICATIONS:  1.  Percocet 5/325 1-2 q.4-6h. p.r.n. pain.  2.  Robaxin 500 1 p.o. q.8h. p.r.n. spasm.  3.  Coumadin per pharmacy protocol.  4.  Trinsicon 1 b.i.d.   DISCHARGE INSTRUCTIONS:  She is to do her home therapy program.  Call if any  problems or questions arise.  She will be seen back in the office in one  week.      SJC/MEDQ  D:  01/25/2004  T:  01/25/2004  Job:  [id number]

## 2010-09-09 NOTE — Op Note (Signed)
NAMEMIRTIE, BASTYR NO.:  [id number]   MEDICAL RECORD NO.:  [id number]          PATIENT TYPE:  INP   LOCATION:  0482                         FACILITY:  [facility]   PHYSICIAN:  Erasmo Leventhal, M.D.DATE OF BIRTH:  May 18, 1945   DATE OF PROCEDURE:  01/19/2004  DATE OF DISCHARGE:                                 OPERATIVE REPORT   PREOPERATIVE DIAGNOSIS:  Right knee end-stage osteoarthritis.   POSTOPERATIVE DIAGNOSIS:  Right knee end-stage osteoarthritis.   PROCEDURE:  Right total knee arthroplasty.   SURGEON:  Erasmo Leventhal, M.D.   ASSISTANT:  Jaquelyn Bitter. Chabon, P.A.-C.   ANESTHESIA:  General with femoral nerve block.   ESTIMATED BLOOD LOSS:  Less than 50 cc.   DRAINS:  Two medium Hemovac.   COMPLICATIONS:  None.   TOURNIQUET TIME:  One hour and 30 minutes at 375 mmHg.   COMPLICATIONS:  None.   DISPOSITION:  To PACU stable.   OPERATIVE IMPLANTS:  Osteonics component.  All cemented size 7 femur, size 7  tibia.  A 12 mm posterior stabilized flexed tibial insert with a 26 mm  patella.   OPERATIVE DETAILS:  Patient was counseled in the holding area.  The correct  side was identified.  An IV was started with skin block and femoral nerve  block was administered.  Following this, she was taken to the operating  room, placed in supine position, administered general anesthesia.  A Foley  catheter placed utilizing sterile technique by the OR circulating nurse.  All extremities were well-padded in bump.  The right knee was examined.  She  had about a 30 degree flexion contracture, flexed to 120 degrees.  She was  elevated, prepped and draped, all draped in. Sterile fashion.  Exsanguinated.  Esmarch.  The tourniquet was inflated to 375 mmHg.   A straight midline incision was made in the skin and subcutaneous tissue.  Medial lateral soft tissue flaps were developed.  Hemostasis was obtained.  The medial parapatellar arthrotomy was performed.  The  proximal medial  tibial soft tissue release was done back to the semimembranosus due to her  varus knee.  End-stage arthritic change.  Osteophytes were removed.  __________ were resected.  A starter hole was made in the distal femur.  The  canal was irrigated.  Effluent was clear.  The __________ were gently  placed.  I chose a 5 degree valgus cut and took a 10 mm cut off the distal  femur.  The distal femur was found to be a size #7.  Rotational marks were  made.  The distal femur was cut to fit a size #7.  Medial and lateral  menisci were removed under direct visualization.  __________ vessels were  coagulated.  Posterior neurovascular structures were __________ off and  protected throughout the entire case.  Osteophytes were removed from the  proximal tibia.  The tibial eminence was resected.  Again, bone against bone  contact.  The starter hole was found to be a size 7.  The starter hole was  raised, steps were utilized.  The canal was irrigated until the effluent was  clear.  Intramedullary rod was gently placed.  I chose a 10 mm cut based  upon the lateral side with a 0 degree slope.  Posterior medial and posterior  lateral femoral osteophytes were removed under direct visualization.  A  femoral __________ was prepared in the standard fashion.  At this time, the  size 7 femur and size 7 tibia with a 10 mm flexed insert with excellent  range of motion of the soft tissue balance, and flexion and extension.  Rotational marks were made in the delta keel and was performed in a standard  fashion.  The patella was found to be a size 26, reamed to appropriate  depth.  Then 10 mm  holes were made, and the excess bone was removed.  At  this time, utilizing moderate cement technique.  All components were  cemented into place after copious pulsatile lavage of the knee itself.  A  size 7 femur, a size 7 tibia with a 26 patella.  We allowed the cement to  cure with the 10 mm tibial insert at this  time.  We felt with the 10 mm  insert, we had a little bit of laxity with the collateral ligament in full  extension with 30 degrees of flexion.  This was then removed.  I put a 12 mm  flexed posterior stabilized tibial insert in its place.  At this point in  time, we had excellent range of motion and flexion and extension.  She was  well balanced in flexion and extension.  The patellofemoral track was  anatomic and did not require lateral release.  This trial was then removed.  Excess cement was then removed.  Bone wax was placed on exposed bony  surfaces and a final 12 mm thick, posterior stabilized flexed tibial  component was tapped into place.  Again, at this time, the knee was given  antibiotic solution during the closure, as were all layers.  Two Hemovac  drains were placed.  A sequential closure of layers was done.  Arthrotomy  Vicryl and subcu Vicryl.  The skin closed with six staples.  A sterile  compressive dressing was applied.  Drains were hooked to suction.  She was  given another gram of Ancef intravenously at the end of the case.  Normal  pulses in the foot and ankles indicates good circulation.  No complications  or problems.  Sponge and needle counts were correct.   Due to the decreased surgical time and help throughout this entire surgical  procedure, Mr. Brett Canales Chabon's assistance was needed.      RAC/MEDQ  D:  01/19/2004  T:  01/19/2004  Job:  [id number]

## 2011-01-13 LAB — COMPREHENSIVE METABOLIC PANEL
ALT: 18
AST: 15
Albumin: 3.6
Alkaline Phosphatase: 93
CO2: 24
Chloride: 102
GFR calc Af Amer: >60
GFR calc non Af Amer: >60
Potassium: 4
Sodium: 137
Total Bilirubin: 0.6

## 2011-01-13 LAB — DIFFERENTIAL
Basophils Absolute: 0
Basophils Relative: 0
Eosinophils Absolute: 0
Eosinophils Absolute: 0.1
Eosinophils Relative: 0
Lymphocytes Relative: 14
Lymphs Abs: 1.5
Monocytes Absolute: 0.6
Monocytes Absolute: 0.6
Monocytes Relative: 8
Neutro Abs: 5.1
Neutrophils Relative %: 70

## 2011-01-13 LAB — LIPASE, BLOOD: Lipase: 20

## 2011-01-13 LAB — URINE MICROSCOPIC-ADD ON

## 2011-01-13 LAB — CBC
Hemoglobin: 11.1 — ABNORMAL LOW
Hemoglobin: 11.1 — ABNORMAL LOW
MCHC: 33.2
MCHC: 34
MCV: 77.7 — ABNORMAL LOW
MCV: 78.2
MCV: 78.8
Platelets: 261
RBC: 4.18
RBC: 4.24
RBC: 5.03
RDW: 14.4
WBC: 12.4 — ABNORMAL HIGH
WBC: 7.4

## 2011-01-13 LAB — URINALYSIS, ROUTINE W REFLEX MICROSCOPIC
Bilirubin Urine: NEGATIVE
Glucose, UA: NEGATIVE
Hgb urine dipstick: NEGATIVE
Specific Gravity, Urine: 1.02
Urobilinogen, UA: 0.2

## 2011-01-23 LAB — CBC
MCHC: 33.8
MCV: 80.4
Platelets: 230
RBC: 4.73

## 2011-01-23 LAB — COMPREHENSIVE METABOLIC PANEL
ALT: 19
AST: 21
Albumin: 3.6
Calcium: 9.1
Creatinine, Ser: 0.56
GFR calc Af Amer: >60
GFR calc non Af Amer: >60
Sodium: 140
Total Protein: 7

## 2011-01-23 LAB — BASIC METABOLIC PANEL
BUN: 5 — ABNORMAL LOW
CO2: 27
Creatinine, Ser: 0.59
GFR calc non Af Amer: >60
Glucose, Bld: 108 — ABNORMAL HIGH
Sodium: 139

## 2011-01-23 LAB — GLUCOSE, CAPILLARY
Glucose-Capillary: 105 — ABNORMAL HIGH
Glucose-Capillary: 126 — ABNORMAL HIGH
Glucose-Capillary: 127 — ABNORMAL HIGH
Glucose-Capillary: 148 — ABNORMAL HIGH
Glucose-Capillary: 175 — ABNORMAL HIGH
Glucose-Capillary: 179 — ABNORMAL HIGH

## 2011-01-23 LAB — DIFFERENTIAL
Eosinophils Absolute: 0.1
Eosinophils Relative: 2
Lymphocytes Relative: 27
Lymphs Abs: 1.7
Monocytes Absolute: 0.3
Monocytes Relative: 6

## 2011-01-27 LAB — CBC
HCT: 31.3 % — ABNORMAL LOW (ref 36.0–46.0)
HCT: 32.1 % — ABNORMAL LOW (ref 36.0–46.0)
HCT: 40.6 % (ref 36.0–46.0)
Hemoglobin: 10.4 g/dL — ABNORMAL LOW (ref 12.0–15.0)
MCHC: 32.4 g/dL (ref 30.0–36.0)
MCHC: 32.9 g/dL (ref 30.0–36.0)
MCV: 81.7 fL (ref 78.0–100.0)
MCV: 82.4 fL (ref 78.0–100.0)
MCV: 82.8 fL (ref 78.0–100.0)
Platelets: 179 10*3/uL (ref 150–400)
Platelets: 237 10*3/uL (ref 150–400)
RBC: 3.81 MIL/uL — ABNORMAL LOW (ref 3.87–5.11)
RBC: 3.9 MIL/uL (ref 3.87–5.11)
RDW: 14.5 % (ref 11.5–15.5)
RDW: 14.7 % (ref 11.5–15.5)
RDW: 15.1 % (ref 11.5–15.5)
WBC: 6.4 10*3/uL (ref 4.0–10.5)
WBC: 8 10*3/uL (ref 4.0–10.5)
WBC: 8.2 10*3/uL (ref 4.0–10.5)
WBC: 8.7 10*3/uL (ref 4.0–10.5)

## 2011-01-27 LAB — COMPREHENSIVE METABOLIC PANEL
ALT: 18 U/L (ref 0–35)
AST: 21 U/L (ref 0–37)
AST: 22 U/L (ref 0–37)
Albumin: 2.1 g/dL — ABNORMAL LOW (ref 3.5–5.2)
Albumin: 3.7 g/dL (ref 3.5–5.2)
Alkaline Phosphatase: 55 U/L (ref 39–117)
BUN: 13 mg/dL (ref 6–23)
BUN: 4 mg/dL — ABNORMAL LOW (ref 6–23)
CO2: 30 mEq/L (ref 19–32)
Calcium: 9.3 mg/dL (ref 8.4–10.5)
Chloride: 103 mEq/L (ref 96–112)
Chloride: 104 mEq/L (ref 96–112)
Chloride: 108 mEq/L (ref 96–112)
Creatinine, Ser: 0.57 mg/dL (ref 0.4–1.2)
Creatinine, Ser: 1.11 mg/dL (ref 0.4–1.2)
GFR calc Af Amer: >60 mL/min (ref >60)
GFR calc Af Amer: >60 mL/min (ref >60)
Glucose, Bld: 254 mg/dL — ABNORMAL HIGH (ref 70–99)
Potassium: 3.8 mEq/L (ref 3.5–5.1)
Potassium: 3.8 mEq/L (ref 3.5–5.1)
Sodium: 138 mEq/L (ref 135–145)
Total Bilirubin: 0.4 mg/dL (ref 0.3–1.2)
Total Bilirubin: 0.7 mg/dL (ref 0.3–1.2)
Total Protein: 4.3 g/dL — ABNORMAL LOW (ref 6.0–8.3)
Total Protein: 5 g/dL — ABNORMAL LOW (ref 6.0–8.3)
Total Protein: 6.7 g/dL (ref 6.0–8.3)

## 2011-01-27 LAB — GLUCOSE, CAPILLARY
Glucose-Capillary: 190 mg/dL — ABNORMAL HIGH (ref 70–99)
Glucose-Capillary: 190 mg/dL — ABNORMAL HIGH (ref 70–99)
Glucose-Capillary: 192 mg/dL — ABNORMAL HIGH (ref 70–99)
Glucose-Capillary: 192 mg/dL — ABNORMAL HIGH (ref 70–99)
Glucose-Capillary: 198 mg/dL — ABNORMAL HIGH (ref 70–99)
Glucose-Capillary: 198 mg/dL — ABNORMAL HIGH (ref 70–99)
Glucose-Capillary: 202 mg/dL — ABNORMAL HIGH (ref 70–99)
Glucose-Capillary: 206 mg/dL — ABNORMAL HIGH (ref 70–99)
Glucose-Capillary: 209 mg/dL — ABNORMAL HIGH (ref 70–99)
Glucose-Capillary: 210 mg/dL — ABNORMAL HIGH (ref 70–99)
Glucose-Capillary: 226 mg/dL — ABNORMAL HIGH (ref 70–99)
Glucose-Capillary: 226 mg/dL — ABNORMAL HIGH (ref 70–99)
Glucose-Capillary: 227 mg/dL — ABNORMAL HIGH (ref 70–99)
Glucose-Capillary: 230 mg/dL — ABNORMAL HIGH (ref 70–99)
Glucose-Capillary: 236 mg/dL — ABNORMAL HIGH (ref 70–99)
Glucose-Capillary: 240 mg/dL — ABNORMAL HIGH (ref 70–99)
Glucose-Capillary: 240 mg/dL — ABNORMAL HIGH (ref 70–99)
Glucose-Capillary: 245 mg/dL — ABNORMAL HIGH (ref 70–99)
Glucose-Capillary: 278 mg/dL — ABNORMAL HIGH (ref 70–99)

## 2011-01-27 LAB — BASIC METABOLIC PANEL
CO2: 30 mEq/L (ref 19–32)
CO2: 30 mEq/L (ref 19–32)
Calcium: 8 mg/dL — ABNORMAL LOW (ref 8.4–10.5)
Chloride: 101 mEq/L (ref 96–112)
Chloride: 105 mEq/L (ref 96–112)
GFR calc Af Amer: >60 mL/min (ref >60)
GFR calc Af Amer: >60 mL/min (ref >60)
Glucose, Bld: 223 mg/dL — ABNORMAL HIGH (ref 70–99)
Potassium: 3.5 mEq/L (ref 3.5–5.1)
Sodium: 139 mEq/L (ref 135–145)

## 2011-01-27 LAB — DIFFERENTIAL
Basophils Absolute: 0 10*3/uL (ref 0.0–0.1)
Eosinophils Relative: 2 % (ref 0–5)
Lymphocytes Relative: 24 % (ref 12–46)
Lymphs Abs: 2 10*3/uL (ref 0.7–4.0)
Monocytes Absolute: 0.5 10*3/uL (ref 0.1–1.0)
Neutro Abs: 5.6 10*3/uL (ref 1.7–7.7)

## 2011-01-27 LAB — HEMOGLOBIN A1C: Mean Plasma Glucose: 212 mg/dL

## 2011-04-13 ENCOUNTER — Emergency Department (HOSPITAL_COMMUNITY)
Admission: EM | Admit: 2011-04-13 | Discharge: 2011-04-13 | Disposition: A | Discharge status: Home / Self Care | Payer: Self-pay | Attending: Emergency Medicine | Admitting: Emergency Medicine

## 2011-04-13 ENCOUNTER — Emergency Department (HOSPITAL_COMMUNITY): Payer: Self-pay

## 2011-04-13 ENCOUNTER — Other Ambulatory Visit: Payer: Self-pay

## 2011-04-13 ENCOUNTER — Encounter (HOSPITAL_COMMUNITY): Payer: Self-pay

## 2011-04-13 DIAGNOSIS — I10 Essential (primary) hypertension: Secondary | ICD-10-CM | POA: Insufficient documentation

## 2011-04-13 DIAGNOSIS — R109 Unspecified abdominal pain: Secondary | ICD-10-CM | POA: Insufficient documentation

## 2011-04-13 DIAGNOSIS — E119 Type 2 diabetes mellitus without complications: Secondary | ICD-10-CM | POA: Insufficient documentation

## 2011-04-13 DIAGNOSIS — Z87442 Personal history of urinary calculi: Secondary | ICD-10-CM | POA: Insufficient documentation

## 2011-04-13 DIAGNOSIS — R072 Precordial pain: Secondary | ICD-10-CM | POA: Insufficient documentation

## 2011-04-13 DIAGNOSIS — Z9889 Other specified postprocedural states: Secondary | ICD-10-CM | POA: Insufficient documentation

## 2011-04-13 DIAGNOSIS — Z79899 Other long term (current) drug therapy: Secondary | ICD-10-CM | POA: Insufficient documentation

## 2011-04-13 DIAGNOSIS — R0602 Shortness of breath: Secondary | ICD-10-CM | POA: Insufficient documentation

## 2011-04-13 DIAGNOSIS — F411 Generalized anxiety disorder: Secondary | ICD-10-CM | POA: Insufficient documentation

## 2011-04-13 DIAGNOSIS — R110 Nausea: Secondary | ICD-10-CM | POA: Insufficient documentation

## 2011-04-13 LAB — CBC
HCT: 41.1 % (ref 36.0–46.0)
MCV: 85.6 fL (ref 78.0–100.0)
RDW: 13.3 % (ref 11.5–15.5)
WBC: 6.9 10*3/uL (ref 4.0–10.5)

## 2011-04-13 LAB — BASIC METABOLIC PANEL
CO2: 23 mEq/L (ref 19–32)
Calcium: 9.9 mg/dL (ref 8.4–10.5)
Creatinine, Ser: 0.57 mg/dL (ref 0.50–1.10)
Glucose, Bld: 201 mg/dL — ABNORMAL HIGH (ref 70–99)

## 2011-04-13 LAB — URINALYSIS, ROUTINE W REFLEX MICROSCOPIC
Hgb urine dipstick: NEGATIVE
Specific Gravity, Urine: 1.003 — ABNORMAL LOW (ref 1.005–1.030)
Urobilinogen, UA: 0.2 mg/dL (ref 0.0–1.0)

## 2011-04-13 LAB — DIFFERENTIAL
Basophils Absolute: 0 10*3/uL (ref 0.0–0.1)
Eosinophils Relative: 4 % (ref 0–5)
Lymphocytes Relative: 27 % (ref 12–46)
Lymphs Abs: 1.9 10*3/uL (ref 0.7–4.0)
Monocytes Absolute: 0.5 10*3/uL (ref 0.1–1.0)

## 2011-04-13 LAB — POCT I-STAT TROPONIN I: Troponin i, poc: 0 ng/mL (ref 0.00–0.08)

## 2011-04-13 LAB — TROPONIN I: Troponin I: <0.3 ng/mL (ref <0.30)

## 2011-04-13 MED ORDER — MORPHINE SULFATE 4 MG/ML IJ SOLN
4.0000 mg | Freq: Once | INTRAMUSCULAR | Status: AC
  Administered 2011-04-13: 4 mg via INTRAVENOUS
  Filled 2011-04-13: qty 1

## 2011-04-13 MED ORDER — ONDANSETRON HCL 4 MG/2ML IJ SOLN
4.0000 mg | Freq: Once | INTRAMUSCULAR | Status: AC
  Administered 2011-04-13: 4 mg via INTRAVENOUS
  Filled 2011-04-13: qty 2

## 2011-04-13 MED ORDER — ASPIRIN 81 MG PO CHEW
324.0000 mg | CHEWABLE_TABLET | Freq: Once | ORAL | Status: AC
  Administered 2011-04-13: 324 mg via ORAL
  Filled 2011-04-13: qty 4

## 2011-04-13 NOTE — ED Notes (Signed)
Rt flank pain since last pm. Denies any urinary sx. Pt reports intermittent cp x 1 week. States bp has been elevated as well. Denies cp/son presently.

## 2011-04-13 NOTE — ED Notes (Signed)
CALLED CT TO CHECK ON DELAY IN PT STUDY.THEY ADVISE THERE ARE 4 PEOPLE IN FRONT OF THIS PATIENT

## 2011-04-13 NOTE — ED Notes (Signed)
Pt states that she started to have chest pain that goes thru to  Her back  X several days states pain in kidney also has had several surgeries she states  On her back and has hx of kidney stones has had some dizziness and her diasylolic has been running in the 100s she states as she has been taking her bp at home

## 2011-04-13 NOTE — ED Notes (Signed)
PT UPDATED ON PLAN OF CARE .Marland KitchenMarland KitchenSHE IS AWARE SHE IS HAVING A CT OF HER KIDNEY AND AWAITNG MORE LABS RELATED TO HER HEART. LUNGS ARE CLEAR. NO PERIF EDEMA APPRECIATED. SINUS RHYTHM ON MONITOR. STATES NO CP AT THIS TIME.

## 2011-04-13 NOTE — ED Provider Notes (Signed)
History     CSN: [id number]  Arrival date & time 04/13/11  1339   First MD Initiated Contact with Patient 04/13/11 1404      Chief Complaint  Patient presents with  . Chest Pain    (Consider location/radiation/quality/duration/timing/severity/associated sxs/prior treatment) HPI Patient is a 65 yo F who presents today complaining of 2 days of intermittent chest pain that is substernal in nature.  She gets short of breath with this and somewhat nauseas.  She states that this is substernally located and is a 8/10 in severity.  She has no history of CAD and has had negative cath a few years ago with similar symptoms.  She has no CP at this time.  She does complain of severe R flank pain.  She has history of kidney stones but reports that this is different from that experience.  She denies nausea, vomiting, constipation, diarrhea, or urinary symptoms.  There are no other associated or modifying factors. Past Medical History  Diagnosis Date  . Diabetes mellitus   . Hypertension     Past Surgical History  Procedure Date  . Colon surgery   . Cholecystectomy   . Appendectomy   . Total knee arthroplasty     History reviewed. No pertinent family history.  History  Substance Use Topics  . Smoking status: Former Games developer  . Smokeless tobacco: Not on file  . Alcohol Use: No    OB History    Grav Para Term Preterm Abortions TAB SAB Ect Mult Living                  Review of Systems  Constitutional: Negative.   HENT: Negative.   Eyes: Negative.   Respiratory: Negative.   Cardiovascular: Positive for chest pain.  Gastrointestinal: Negative.   Genitourinary: Positive for flank pain.  Musculoskeletal: Negative.   Skin: Negative.   Neurological: Negative.   Hematological: Negative.   Psychiatric/Behavioral: Negative.   All other systems reviewed and are negative.    Allergies  Hydromorphone hcl and Morphine and related  Home Medications   Current Outpatient Rx  Name  Route Sig Dispense Refill  . ALPRAZOLAM 1 MG PO TABS Oral Take 1 mg by mouth 3 (three) times daily.      Marland Kitchen BENAZEPRIL HCL 20 MG PO TABS Oral Take 20 mg by mouth daily.      Marland Kitchen CARBIDOPA-LEVODOPA ER 50-200 MG PO TBCR Oral Take 1 tablet by mouth daily.      Marland Kitchen FLUTICASONE-SALMETEROL 500-50 MCG/DOSE IN AEPB Inhalation Inhale 1 puff into the lungs every 12 (twelve) hours.      Marland Kitchen GABAPENTIN 300 MG PO CAPS Oral Take 600 mg by mouth 2 (two) times daily.      Marland Kitchen HYDROCODONE-ACETAMINOPHEN 10-325 MG PO TABS Oral Take 1 tablet by mouth every 6 (six) hours as needed. For pain     . SIMVASTATIN 20 MG PO TABS Oral Take 20 mg by mouth at bedtime.        BP 106/66  Pulse 82  Temp(Src) 97.3 F (36.3 C) (Oral)  Resp 20  SpO2 100%  Physical Exam  Nursing note and vitals reviewed. Constitutional: She is oriented to person, place, and time. She appears well-developed and well-nourished. No distress.  HENT:  Head: Normocephalic and atraumatic.  Eyes: Conjunctivae and EOM are normal. Pupils are equal, round, and reactive to light.  Neck: Normal range of motion.  Cardiovascular: Normal rate, regular rhythm, normal heart sounds and intact distal pulses.  Exam  reveals no gallop and no friction rub.   No murmur heard. Pulmonary/Chest: Effort normal and breath sounds normal. No respiratory distress. She has no wheezes. She has no rales.  Abdominal: Soft. Bowel sounds are normal. She exhibits no distension. There is no tenderness. There is no rebound and no guarding.  Musculoskeletal: Normal range of motion.  Neurological: She is alert and oriented to person, place, and time. No cranial nerve deficit. She exhibits normal muscle tone. Coordination normal.  Skin: Skin is warm and dry. No rash noted.  Psychiatric:       anxious    ED Course  Procedures (including critical care time)   Date: 04/13/2011  Rate: 91  Rhythm: normal sinus rhythm  QRS Axis: normal  Intervals: normal  ST/T Wave abnormalities:  normal  Conduction Disutrbances:none  Narrative Interpretation:   Old EKG Reviewed: none available   Labs Reviewed  BASIC METABOLIC PANEL - Abnormal; Notable for the following:    Glucose, Bld 201 (*)    All other components within normal limits  URINALYSIS, ROUTINE W REFLEX MICROSCOPIC - Abnormal; Notable for the following:    Specific Gravity, Urine 1.003 (*)    Leukocytes, UA MODERATE (*)    All other components within normal limits  URINE MICROSCOPIC-ADD ON - Abnormal; Notable for the following:    Squamous Epithelial / LPF MANY (*)    All other components within normal limits  CBC  DIFFERENTIAL  POCT I-STAT TROPONIN I  TROPONIN I  I-STAT TROPONIN I   Ct Abdomen Pelvis Wo Contrast  04/13/2011  *RADIOLOGY REPORT*  Clinical Data: Right flank pain, evaluate for kidney stone. History of colectomy with reversal for diverticulitis.  CT ABDOMEN AND PELVIS WITHOUT CONTRAST  Technique:  Multidetector CT imaging of the abdomen and pelvis was performed following the standard protocol without intravenous contrast.  Comparison: 08/24/2010  Findings: Mild dependent atelectasis at the right lung base.  Mild hepatic steatosis.  Unenhanced spleen, pancreas, and adrenal glands are within normal limits.  Status post cholecystectomy.  Common duct tapers normally at the level of the pancreatic head.  Kidneys are unremarkable.  No renal calculi or hydronephrosis.  No evidence of bowel obstruction.  Prior distal colonic resection (series 2/image 78). Colonic diverticulosis without associated inflammatory changes.  Atherosclerotic calcifications of the abdominal aorta and branch vessels.  No abdominopelvic ascites.  No suspicious abdominopelvic lymphadenopathy.  Status post hysterectomy.  No adnexal masses.  No ureteral or bladder calculi.  Tiny fat-containing umbilical hernia.  Degenerative changes of the visualized thoracolumbar spine.  IMPRESSION: No renal, ureteral, or bladder calculi.  No hydronephrosis.   Prior distal colonic resection. Colonic diverticulosis without associated inflammatory changes.  Mild hepatic steatosis.  Original Report Authenticated By: Charline Bills, M.D.   Dg Chest 2 View  04/13/2011  *RADIOLOGY REPORT*  Clinical Data: Chest pain.  Shortness of breath.  Hypertension.  CHEST - 2 VIEW  Comparison: 09/30/2009  Findings: Heart size is normal.  Mediastinal shadows are normal. The lungs are clear.  No effusions.  No acute bony findings.  IMPRESSION: No active disease  Original Report Authenticated By: Thomasenia Sales, M.D.     1. Chest pain       MDM  Patient was hemodynamically stable but anxious appearing.  Patient was a diabetic female over the age of 83. She was given ASA and a cardiac work-up was performed.  The patient had negative CXR, unchanged EKG, and unremarkable CBC and metabolic panel.  She also had negative TNI  at 0 and 3 hours.  Given her symptoms patient also had a UA and a non-con CT to eval for stones.  CT was negative.  UA showed contamination and was nitrite negative.  During her period of observation patient felt much better.  She was discharged in good condition.  Patient was followed and finally discharged by [facility].  Patient will follow-up with her PCP.        Cyndra Numbers, MD 04/14/11 [phone]

## 2011-04-13 NOTE — ED Notes (Signed)
Patient presents with left sided, heavy, dull, chest pain with radiation to back x several days with hypertension (checking at home) and headaches.  Patient reports shortness of breath and nausea

## 2012-03-14 ENCOUNTER — Ambulatory Visit: Payer: Self-pay | Admitting: Neurology

## 2012-03-19 ENCOUNTER — Encounter: Payer: Self-pay | Admitting: Neurology

## 2012-03-19 ENCOUNTER — Ambulatory Visit (INDEPENDENT_AMBULATORY_CARE_PROVIDER_SITE_OTHER): Payer: Medicare Other | Admitting: Neurology

## 2012-03-19 VITALS — BP 128/80 | HR 80 | Temp 98.5°F | Resp 20 | Wt 254.0 lb

## 2012-03-19 DIAGNOSIS — H539 Unspecified visual disturbance: Secondary | ICD-10-CM

## 2012-03-19 DIAGNOSIS — G250 Essential tremor: Secondary | ICD-10-CM

## 2012-03-19 DIAGNOSIS — G252 Other specified forms of tremor: Secondary | ICD-10-CM

## 2012-03-19 DIAGNOSIS — G4730 Sleep apnea, unspecified: Secondary | ICD-10-CM

## 2012-03-19 LAB — BASIC METABOLIC PANEL
BUN: 18 mg/dL (ref 6–23)
CO2: 29 mEq/L (ref 19–32)
Calcium: 9.4 mg/dL (ref 8.4–10.5)
Creatinine, Ser: 0.9 mg/dL (ref 0.4–1.2)
Glucose, Bld: 139 mg/dL — ABNORMAL HIGH (ref 70–99)

## 2012-03-19 NOTE — Progress Notes (Signed)
[name] was seen today in the movement disorders clinic for neurologic consultation at the request of BUTLER, CYNTHIA, DO.  The consultation is for the evaluation of Parkinsons disease.  She got her dx 3-4 years ago after her head and hands were tremoring and she had a tremor on the inside of the body.  She was referred to a neurologist at [facility] in [facility] and she was given a dx of PD.  She was started on L-dopa 3-4 years ago.  She found it to be helpful with the tremor.  She states that over the course of time her dose was changed from the 25/100 to the 50/200 tid.  The last increase was 6 months ago.  She states that she is having less tremor on the inside since the initiation of 50/200 dose.  She cannot tell when the medication wears off or kicks in.  She does state that when she younger (twenties) she "shook like a ragdoll" but it eventually went away until she was about 51y/o.  Her sister has severe essential tremor.  Her brother is diagnosed with PD.  Her father also had essential tremor.   Specific Symptoms:  Tremor: yes in hands > head and hand tremor most with activation.  She thinks that the L hand is worse than the right. (she is R hand dominant) Voice: raspy intermittently, occasional loss of strength of voice. Sleep: has a dx of sleep apnea but not evaluated for over 10 years.  Using it nightly but knows that she needs reevaluated (she is using her daughters because hers "gave out" and it was never set to the correct pressure.  Weight loss though over 10 years (50 lbs).    Has EDS again.  Vivid Dreams:  no  Acting out dreams:  no Wet Pillows: yes Postural symptoms:  yes  Falls?  yes but rarely with last fall 6-8 months ago. Bradykinesia symptoms: handwriting bigger over the years but sloppy Loss of smell:  yes Loss of taste:  no Urinary Incontinence:  no Difficulty Swallowing:  yes intermittently with liquids and solids Handwriting, micrographia: no Trouble with  ADL's:  no  Trouble buttoning clothing: no Depression:  no Memory changes:  no Hallucinations:  no  visual distortions: yes (frequent bats in the periphery). N/V:  no Lightheaded:  yes  Syncope: no Diplopia:  no but occasionally will look at words and some of the letters are missing.  Eye dr eval and negative.  No headache with it.  Last 10-30 min, about 1 time per month. Dyskinesia:  no Wearing off:  no  Neuroimaging has not previously been performed.   PREVIOUS MEDICATIONS: Sinemet and Sinemet CR  ALLERGIES:   Allergies  Allergen Reactions  . Hydromorphone Hcl Other (See Comments)    sob  . Morphine And Related Nausea And Vomiting and Other (See Comments)    headache    CURRENT MEDICATIONS:  Current Outpatient Prescriptions on File Prior to Visit  Medication Sig Dispense Refill  . ALPRAZolam (XANAX) 1 MG tablet Take 1 mg by mouth 3 (three) times daily.        . benazepril (LOTENSIN) 20 MG tablet Take 20 mg by mouth daily.        . carbidopa-levodopa (SINEMET CR) 50-200 MG per tablet Take 1 tablet by mouth daily.        . Fluticasone-Salmeterol (ADVAIR) 500-50 MCG/DOSE AEPB Inhale 1 puff into the lungs every 12 (twelve) hours.        Marland Kitchen  gabapentin (NEURONTIN) 300 MG capsule Take 600 mg by mouth 2 (two) times daily.        Marland Kitchen HYDROcodone-acetaminophen (NORCO) 10-325 MG per tablet Take 1 tablet by mouth every 6 (six) hours as needed. For pain       . insulin glargine (LANTUS) 100 UNIT/ML injection Inject 100 Units into the skin at bedtime.      . simvastatin (ZOCOR) 20 MG tablet Take 20 mg by mouth at bedtime.          PAST MEDICAL HISTORY:   Past Medical History  Diagnosis Date  . Diabetes mellitus   . Hypertension   . Parkinson disease 2010    PAST SURGICAL HISTORY:   Past Surgical History  Procedure Date  . Colon surgery     colectomy and reversal after diverticular ds  . Cholecystectomy   . Appendectomy   . Total knee arthroplasty     right  . Tonsillectomy       SOCIAL HISTORY:   History   Social History  . Marital Status: Single    Spouse Name: N/A    Number of Children: N/A  . Years of Education: N/A   Occupational History  . retired     Designer, fashion/clothing; [employer]   Social History Main Topics  . Smoking status: Former Smoker    Quit date: 03/20/1987  . Smokeless tobacco: Never Used  . Alcohol Use: No  . Drug Use: No  . Sexually Active: Not on file   Other Topics Concern  . Not on file   Social History Narrative  . No narrative on file    FAMILY HISTORY:   Family Status  Relation Status Death Age  . Mother Deceased     CA (lung)  . Father Deceased     EtOHic liver disease  . Sister Deceased     3, DM, cerebral aneurysm, pneumonia  . Brother Deceased     2, CAD, small cell lung CA  . Sister Alive     HTN, HLD, tremor  . Brother Alive     PD (? vascular)  . Child Alive     5, healthy    ROS:  A complete 10 system review of systems was obtained and was unremarkable apart from what is mentioned above.  PHYSICAL EXAMINATION:    VITALS:   Filed Vitals:   03/19/12 1337  BP: 128/80  Pulse: 80  Temp: 98.5 F (36.9 C)  Resp: 20  Weight: 254 lb (115.214 kg)    GEN:  The patient appears stated age and is in NAD. HEENT:  Normocephalic, atraumatic.  The mucous membranes are moist. The superficial temporal arteries are without ropiness or tenderness. CV:  RRR with 2-3/6 SEM radiating to the L carotid. Lungs:  CTAB Neck/HEME:  There are no carotid bruits bilaterally.  Neurological examination:  Orientation: The patient is alert and oriented x3. Fund of knowledge is appropriate.  Recent and remote memory are intact.  Attention and concentration are normal.    Able to name objects and repeat phrases. Cranial nerves: There is good facial symmetry. Pupils are equal round and reactive to light bilaterally. Fundoscopic exam reveals clear margins bilaterally. Extraocular muscles are intact. There are no square wave  jerks.  The visual fields are full to confrontational testing. The speech is fluent and clear. Soft palate rises symmetrically and there is no tongue deviation. Hearing is intact to conversational tone. Sensation: Sensation is intact to light and pinprick throughout (  facial, trunk, extremities). Vibration is intact at the bilateral big toe. There is no extinction with double simultaneous stimulation. There is no sensory dermatomal level identified. Motor: Strength is 5/5 in the bilateral upper and lower extremities.   Shoulder shrug is equal and symmetric.  There is no pronator drift. Deep tendon reflexes: Deep tendon reflexes are 2-/4 at the bilateral biceps, triceps, brachioradialis, patella and achilles. Plantar responses are downgoing bilaterally.  Movement examination: Tone: There is normal tone in the bilateral upper extremities.  The tone in the lower extremities is normal.  Abnormal movements: She has mild head titubation in the "yes" direction.  She has no rest tremor.  She has tremor of the outstretched hand (mild-mod), but in the clinic the R appeared worse than L.  She had trouble with archimedes spirals. Coordination:  There is no decremation with RAM's, Including alternating supination and pronation of the forearm, hand opening and closing, finger taps, heel taps and toe taps. Gait and Station: The patient has no difficulty arising out of a deep-seated chair without the use of the hands. The patient's stride length is normal but she does have some postural instability.  The patient has a negative pull test.    She has trouble with ambulating in a tandem fashion.  ASSESSMENT:   1.  Tremor.  I believe that the patient has essential tremor given the long-standing nature of the disorder and a very strong family history of essential tremor.  In addition, she has no rest component to the tremor.  She also has no bradykinesia or rigidity.  I am doubting her diagnosis of Parkinson's disease, but I  did tell her that it is possible that her medication has treated her so well that it has covered the symptoms.  However, I do doubt this.  2.  Obstructive sleep apnea syndrome.  The patient needs a split-night study.  PLAN:   1.  I am going to have her stop her levodopa for one week prior to following up so that I can see what she looks like off of medication.  She wants to wait for followup until after the holidays and I have no objection to that. 2.  I suspect that her tremor will continue off of the levodopa and then we may need to add a pure tremor medication, such as primidone therapy. 3.  I am going to schedule her for a split night nocturnal polysomnogram. 4.  Given the recent visual change I am going to schedule her for a MRI of the brain with and without gadolinium.  Her eye Dr. Milagros Loll no etiology for the issue. 5.  I will see her back in the next few weeks, sooner should new neurologic issues arise.

## 2012-03-19 NOTE — Patient Instructions (Addendum)
1.  Please stop your levodopa 1 week prior to following up with me. 2.  Your MRI is scheduled for Monday, December 2nd at 1:00pm.  Please arrive to [facility], first floor admitting by 12:45pm  [phone]. 3.  Your sleep study is scheduled for Monday, December 23 at 8:00pm. At the [facility].  They will send you a packet in the mail with all the information you will need.  [phone]

## 2012-03-25 ENCOUNTER — Ambulatory Visit (HOSPITAL_COMMUNITY)
Admission: RE | Admit: 2012-03-25 | Discharge: 2012-03-25 | Disposition: A | Discharge status: Home / Self Care | Payer: Medicare Other | Source: Ambulatory Visit | Attending: Neurology | Admitting: Neurology

## 2012-03-25 DIAGNOSIS — H539 Unspecified visual disturbance: Secondary | ICD-10-CM | POA: Insufficient documentation

## 2012-03-25 DIAGNOSIS — G252 Other specified forms of tremor: Secondary | ICD-10-CM | POA: Insufficient documentation

## 2012-03-25 DIAGNOSIS — G250 Essential tremor: Secondary | ICD-10-CM | POA: Insufficient documentation

## 2012-03-25 MED ORDER — GADOBENATE DIMEGLUMINE 529 MG/ML IV SOLN
20.0000 mL | Freq: Once | INTRAVENOUS | Status: AC
  Administered 2012-03-25: 20 mL via INTRAVENOUS

## 2012-04-15 ENCOUNTER — Encounter (HOSPITAL_BASED_OUTPATIENT_CLINIC_OR_DEPARTMENT_OTHER): Payer: Medicare Other

## 2012-04-25 ENCOUNTER — Ambulatory Visit (HOSPITAL_BASED_OUTPATIENT_CLINIC_OR_DEPARTMENT_OTHER): Payer: Medicare Other | Attending: Neurology

## 2012-04-25 ENCOUNTER — Ambulatory Visit: Payer: Medicare Other | Admitting: Neurology

## 2012-04-25 VITALS — Ht 63.0 in | Wt 245.0 lb

## 2012-04-25 DIAGNOSIS — G4733 Obstructive sleep apnea (adult) (pediatric): Secondary | ICD-10-CM | POA: Insufficient documentation

## 2012-04-25 DIAGNOSIS — H539 Unspecified visual disturbance: Secondary | ICD-10-CM | POA: Insufficient documentation

## 2012-04-25 DIAGNOSIS — Z9989 Dependence on other enabling machines and devices: Secondary | ICD-10-CM

## 2012-04-25 DIAGNOSIS — G4730 Sleep apnea, unspecified: Secondary | ICD-10-CM | POA: Insufficient documentation

## 2012-04-25 DIAGNOSIS — G250 Essential tremor: Secondary | ICD-10-CM | POA: Insufficient documentation

## 2012-04-25 DIAGNOSIS — G252 Other specified forms of tremor: Secondary | ICD-10-CM | POA: Insufficient documentation

## 2012-05-02 ENCOUNTER — Ambulatory Visit: Payer: Medicare Other | Admitting: Neurology

## 2012-05-08 DIAGNOSIS — G4710 Hypersomnia, unspecified: Secondary | ICD-10-CM

## 2012-05-08 DIAGNOSIS — G4730 Sleep apnea, unspecified: Secondary | ICD-10-CM

## 2012-05-08 NOTE — Procedures (Signed)
NAMEEYONNA, SANDSTROM            ACCOUNT NO.:  [id number]  MEDICAL RECORD NO.:  [id number]          PATIENT TYPE:  OUT  LOCATION:  SLEEP CENTER                 FACILITY:  [facility] [place]  PHYSICIAN:  Barbaraann Share, MD,FCCPDATE OF BIRTH:  [birth year] 16, 1946  DATE OF STUDY:  04/25/2012                           NOCTURNAL POLYSOMNOGRAM  REFERRING PHYSICIAN:  REBECCA S TAT  INDICATION FOR STUDY:  Hypersomnia with sleep apnea.  EPWORTH SLEEPINESS SCORE:  1.  MEDICATIONS:  SLEEP ARCHITECTURE:  The patient had a total sleep time of 401 minutes with no slow-wave sleep and only 76 minutes of REM.  Sleep onset latency was normal at 9 minutes and REM onset did not occur until the titration portion of the study.  Sleep efficiency was 91% during the diagnostic portion and 90% during the titration portion.  RESPIRATORY DATA:  The patient underwent a split-night protocol, where she was found to have 46 obstructive hypopneas and apneas in the first 167 minutes of sleep.  This gave her an apnea-hypopnea index of 17 events per hour during the diagnostic portion of the study.  The events were not positional, and there was mild snoring noted throughout.  By protocol, she was fitted with a large ESON mask and CPAP titration was initiated.  She appeared to have an optimal pressure of 11 cm of water.  OXYGEN DATA:  There was O2 desaturation as low as 84% with the patient's obstructive events.  CARDIAC DATA:  No clinically significant arrhythmias were noted.  MOVEMENT-PARASOMNIA:  The patient was found to have 187 periodic limb movements, but there was no significant sleep disruption.  No abnormal behaviors were seen.  IMPRESSIONS-RECOMMENDATIONS:  Split night study reveals mild obstructive sleep apnea with an AHI of 17 events per hour and oxygen transiently down to 84% during the diagnostic portion of the study.  The patient was then fitted with a large ESON mask, and found to have an optimal pressure around  11 cm of water.  Because of the mild nature of the patient's sleep apnea, this is not a significant cardiovascular risk for her.  Therefore, other treatment options could be considered such as a trial of weight loss alone, upper airway surgery, dental appliance, as well as CPAP. Clinical correlation is suggested.     Barbaraann Share, MD,FCCP Diplomate, American Board of Sleep Medicine    [facility]/MEDQ  D:  05/08/2012 08:18:58  T:  05/08/2012 08:37:31  Job:  [id number]

## 2012-05-10 ENCOUNTER — Encounter: Payer: Self-pay | Admitting: Neurology

## 2012-05-10 ENCOUNTER — Ambulatory Visit (INDEPENDENT_AMBULATORY_CARE_PROVIDER_SITE_OTHER): Payer: Medicare Other | Admitting: Neurology

## 2012-05-10 VITALS — BP 116/60 | HR 80 | Temp 97.8°F | Resp 12 | Ht 64.0 in | Wt 246.0 lb

## 2012-05-10 DIAGNOSIS — G4733 Obstructive sleep apnea (adult) (pediatric): Secondary | ICD-10-CM | POA: Insufficient documentation

## 2012-05-10 DIAGNOSIS — G250 Essential tremor: Secondary | ICD-10-CM

## 2012-05-10 MED ORDER — PRIMIDONE 50 MG PO TABS: 50.0000 mg | ORAL_TABLET | Freq: Every day | ORAL | Status: DC

## 2012-05-10 NOTE — Patient Instructions (Addendum)
We will send referral to [facility] to get you set up for the CPAP machine.  If you have not heard from them in a week let us know. We will contact you for your four month follow up.

## 2012-05-10 NOTE — Progress Notes (Signed)
[name] was seen today in the movement disorders clinic for f/u re: tremor.  She had been on Levodopa but I asked her to stop that prior to todays visit as I was not convinced that her tremor was Parkinsonian; it appeared to be ET.   She states that she tried to stop the medication but was only off it for about a day and a half to 2 days and states that she just could not tolerate the inner tremor as well as the outward tremor.  She is back on the medication.  She has been back on it for about 4 days.  She states that she is on the carbidopa/levodopa 50/200 CR and takes that 2-3 times per day.  She is still on Xanax and takes them 3-4 times per day, but takes them for anxiety.  The patient did have an MRI of the brain that I reviewed.   There were a few scattered T2 hyperintensities but it was otherwise unremarkable.    Her sister has severe essential tremor.  Her brother is diagnosed with PD.  Her father also had essential tremor.  The patient also has a history of sleep apnea.  She did have a split-night nocturnal polysomnogram done at the end of December, 2013.  There was evidence of sleep apnea with an AHI of 17.0 and CPAP was recommended at 11.  Her O2 nadir was 84%.  There were frequent PLM's but they were not disruptive to sleep.   Specific Symptoms:  Tremor: yes in hands > head and hand tremor most with activation.  She thinks that the L hand is worse than the right. (she is R hand dominant) Voice: raspy intermittently, occasional loss of strength of voice. Sleep: has a dx of sleep apnea evaluated in 03/2012.    Vivid Dreams:  no  Acting out dreams:  no Wet Pillows: yes Postural symptoms:  yes  Falls?  yes but rarely with last fall 6-8 months ago. Bradykinesia symptoms: handwriting bigger over the years but sloppy Loss of smell:  yes Loss of taste:  no Urinary Incontinence:  no Difficulty Swallowing:  yes intermittently with liquids and solids Handwriting, micrographia:  no Trouble with ADL's:  no  Trouble buttoning clothing: no Depression:  no Memory changes:  no Hallucinations:  no  visual distortions: yes (frequent bats in the periphery). N/V:  no Lightheaded:  yes  Syncope: no Diplopia:  no but occasionally will look at words and some of the letters are missing.  Eye dr eval and negative.  No headache with it.  Last 10-30 min, about 1 time per month. Dyskinesia:  no Wearing off:  no  Neuroimaging has not previously been performed.   PREVIOUS MEDICATIONS: Sinemet and Sinemet CR  ALLERGIES:   Allergies  Allergen Reactions  . Hydromorphone Hcl Other (See Comments)    sob  . Morphine And Related Nausea And Vomiting and Other (See Comments)    headache    CURRENT MEDICATIONS:  Current Outpatient Prescriptions on File Prior to Visit  Medication Sig Dispense Refill  . ALPRAZolam (XANAX) 1 MG tablet Take 1 mg by mouth 3 (three) times daily.        . benazepril (LOTENSIN) 20 MG tablet Take 20 mg by mouth daily.        . carbidopa-levodopa (SINEMET CR) 50-200 MG per tablet Take 1 tablet by mouth daily.        . Fluticasone-Salmeterol (ADVAIR) 500-50 MCG/DOSE AEPB Inhale 1 puff into the  lungs every 12 (twelve) hours.        . gabapentin (NEURONTIN) 300 MG capsule Take 600 mg by mouth 2 (two) times daily.        Marland Kitchen HYDROcodone-acetaminophen (NORCO) 10-325 MG per tablet Take 1 tablet by mouth every 6 (six) hours as needed. For pain       . insulin glargine (LANTUS) 100 UNIT/ML injection Inject 100 Units into the skin at bedtime.      . simvastatin (ZOCOR) 20 MG tablet Take 20 mg by mouth at bedtime.        . primidone (MYSOLINE) 50 MG tablet Take 1 tablet (50 mg total) by mouth at bedtime.  30 tablet  4    PAST MEDICAL HISTORY:   Past Medical History  Diagnosis Date  . Diabetes mellitus   . Hypertension   . Parkinson disease 2010  . Bleeding nose     PAST SURGICAL HISTORY:   Past Surgical History  Procedure Date  . Colon surgery      colectomy and reversal after diverticular ds  . Cholecystectomy   . Appendectomy   . Total knee arthroplasty     right  . Tonsillectomy     SOCIAL HISTORY:   History   Social History  . Marital Status: Single    Spouse Name: N/A    Number of Children: N/A  . Years of Education: N/A   Occupational History  . retired     Designer, fashion/clothing; [employer]   Social History Main Topics  . Smoking status: Former Smoker    Quit date: 03/20/1987  . Smokeless tobacco: Never Used  . Alcohol Use: No  . Drug Use: No  . Sexually Active: Not on file   Other Topics Concern  . Not on file   Social History Narrative  . No narrative on file    FAMILY HISTORY:   Family Status  Relation Status Death Age  . Mother Deceased     CA (lung)  . Father Deceased     EtOHic liver disease  . Sister Deceased     3, DM, cerebral aneurysm, pneumonia  . Brother Deceased     2, CAD, small cell lung CA  . Sister Alive     HTN, HLD, tremor  . Brother Alive     PD (? vascular)  . Child Alive     6, healthy    ROS:  A complete 10 system review of systems was obtained and was unremarkable apart from what is mentioned above.  PHYSICAL EXAMINATION:    VITALS:   Filed Vitals:   05/10/12 0952  BP: 116/60  Pulse: 80  Temp: 97.8 F (36.6 C)  Resp: 12  Height: 5\' 4"  (1.626 m)  Weight: 246 lb (111.585 kg)    GEN:  The patient appears stated age and is in NAD. HEENT:  Normocephalic, atraumatic.  The mucous membranes are moist. The superficial temporal arteries are without ropiness or tenderness. CV:  RRR with 2-3/6 SEM radiating to the L carotid. Lungs:  CTAB Neck/HEME:  There are no carotid bruits bilaterally.  Neurological examination:  Orientation: The patient is alert and oriented x3. Fund of knowledge is appropriate.  Recent and remote memory are intact.  Attention and concentration are normal.    Able to name objects and repeat phrases. Cranial nerves: There is good facial symmetry.  Pupils are equal round and reactive to light bilaterally. Fundoscopic exam reveals clear margins bilaterally. Extraocular muscles are intact. There  are no square wave jerks.  The visual fields are full to confrontational testing. The speech is fluent and clear. Soft palate rises symmetrically and there is no tongue deviation. Hearing is intact to conversational tone. Sensation: Sensation is intact to light and pinprick throughout (facial, trunk, extremities). Vibration is intact at the bilateral big toe. There is no extinction with double simultaneous stimulation. There is no sensory dermatomal level identified. Motor: Strength is 5/5 in the bilateral upper and lower extremities.   Shoulder shrug is equal and symmetric.  There is no pronator drift. Deep tendon reflexes: Deep tendon reflexes are 2-/4 at the bilateral biceps, triceps, brachioradialis, patella and achilles. Plantar responses are downgoing bilaterally.  Movement examination: Tone: There is normal tone in the bilateral upper extremities.  The tone in the lower extremities is normal.  Abnormal movements: She has mild head titubation in the "yes" direction.  She has no rest tremor.  She has tremor of the outstretched hand, moderate on the R and mild on the L. Coordination:  There is no decremation with RAM's, Including alternating supination and pronation of the forearm, hand opening and closing, finger taps, heel taps and toe taps. Gait and Station: The patient has no difficulty arising out of a deep-seated chair without the use of the hands. The patient's stride length is normal but she does have some postural instability.  The patient has a negative pull test.    She has trouble with ambulating in a tandem fashion.  ASSESSMENT/PLAN:  1.  Tremor.  I still believe that the patient has essential tremor given the long-standing nature of the disorder and a very strong family history of essential tremor.  In addition, she has no rest component to  the tremor.  She also has no bradykinesia or rigidity.  I am doubting her diagnosis of Parkinson's disease, but I did tell her that it is possible that her medication has treated her so well that it has covered the symptoms.    -For now, I am going to add primidone 50 mg.  She will take half a tablet at night for the first 3 nights and then will increase to one tablet nightly.  Risks, benefits, side effects and alternative therapies were discussed.  The opportunity to ask questions was given and they were answered to the best of my ability.  The patient expressed understanding and willingness to follow the outlined treatment protocols.  -For now, she will continue the carbidopa/levodopa CR 50/202-3 times per day.  In the future, I may have her just stop this 24 hours before the visit, since she could not tolerate stopping it longer.  I would like to see her off of it at some point in order to do a clinical examination. 2.  Obstructive sleep apnea syndrome.     -A split night study was done end of 2013 and CPAP at 11 is recommended.  I wrote a prescription for CPAP at 11 with a heated humidifier.  -The PSG did reveal frequent PLM's but this was nondisruptive to sleep so it was opted not to treat this. 3.  I will plan on seeing her back in the office in the next 3-4 months, sooner should new neurologic issues arise.  Time in the office with the patient today was 25 minutes.

## 2012-05-13 ENCOUNTER — Encounter: Payer: Self-pay | Admitting: Gastroenterology

## 2012-07-12 ENCOUNTER — Ambulatory Visit: Payer: Medicare Other | Admitting: Neurology

## 2012-09-20 ENCOUNTER — Telehealth: Payer: Self-pay | Admitting: Neurology

## 2012-09-20 NOTE — Telephone Encounter (Signed)
Per [facility], pt needs to follow up w/ Dr. Arbutus Leas. This is required in relation to her CPAP machine. LM for pt to call for appt / Sherri S.

## 2012-09-30 NOTE — Telephone Encounter (Signed)
Dr. Arbutus Leas patient, not an Ever[facility] All patient

## 2012-09-30 NOTE — Telephone Encounter (Signed)
LM #2 for pt to call for appt w/ Dr. Arbutus Leas as requested by [facility] / Oneita Kras.

## 2012-10-02 ENCOUNTER — Ambulatory Visit: Payer: Medicare Other | Admitting: Neurology

## 2012-10-04 ENCOUNTER — Ambulatory Visit (INDEPENDENT_AMBULATORY_CARE_PROVIDER_SITE_OTHER): Payer: Medicare Other | Admitting: Neurology

## 2012-10-04 ENCOUNTER — Encounter: Payer: Self-pay | Admitting: Neurology

## 2012-10-04 VITALS — BP 110/72 | HR 84 | Temp 98.5°F | Resp 20 | Wt 248.0 lb

## 2012-10-04 DIAGNOSIS — G250 Essential tremor: Secondary | ICD-10-CM

## 2012-10-04 DIAGNOSIS — G4733 Obstructive sleep apnea (adult) (pediatric): Secondary | ICD-10-CM

## 2012-10-04 MED ORDER — PRIMIDONE 50 MG PO TABS: 50.0000 mg | ORAL_TABLET | Freq: Two times a day (BID) | ORAL | Status: DC

## 2012-10-04 NOTE — Patient Instructions (Addendum)
1.  Call me if you have trouble getting your sleep supplies 2.  Increase your primidone to 50 mg, once per day. 3.  We will see you back in October.

## 2012-10-04 NOTE — Progress Notes (Signed)
[name] was seen today in the movement disorders clinic for f/u re: tremor.  She had been on Levodopa but I asked her to stop that prior to todays visit as I was not convinced that her tremor was Parkinsonian; it appeared to be ET.   She states that she tried to stop the medication but was only off it for about a day and a half to 2 days and states that she just could not tolerate the inner tremor as well as the outward tremor.  She is back on the medication.   She is still on Xanax and takes them 3-4 times per day, but takes them for anxiety.    The patient did have an MRI of the brain that I reviewed.   There were a few scattered T2 hyperintensities but it was otherwise unremarkable.    Her sister has severe essential tremor.  Her brother is diagnosed with PD.  Her father also had essential tremor.  10/04/12:  The patient also has a history of sleep apnea.  She did have a split-night nocturnal polysomnogram done at the end of December, 2013.  There was evidence of sleep apnea with an AHI of 17.0 and CPAP was recommended at 11.  Her O2 nadir was 84%.  There were frequent PLM's but they were not disruptive to sleep.  She feels great with the new CPAP and is wearing it faithfully.  She is due for new supplies.  She no longer has the excessive daytime hypersomnolence.  Last visit, primidone was added to her carbidopa/levodopa.  She reports no side effects with the medication.  She remains on her levodopa, but actually has decreased it from the 50/200 three per day to once per day at night.  She noticed no difference with that.  At this point, she has noticed no benefit with the primidone.  She continues to spill things at restaurants.  She often wears much of her food.  She has significant difficulty writing.   Specific Symptoms:  Tremor: yes in hands > head and hand tremor most with activation.  She thinks that the L hand is worse than the right. (she is R hand dominant) Voice: raspy  intermittently, occasional loss of strength of voice. Sleep: has a dx of sleep apnea evaluated in 03/2012.    Vivid Dreams:  no  Acting out dreams:  no Wet Pillows: yes Postural symptoms:  yes  Falls?  yes but rarely with last fall 6-8 months ago. Bradykinesia symptoms: handwriting bigger over the years but sloppy Loss of smell:  yes Loss of taste:  no Urinary Incontinence:  no Difficulty Swallowing:  yes intermittently with liquids and solids Handwriting, micrographia: no Trouble with ADL's:  no  Trouble buttoning clothing: no Depression:  no Memory changes:  no Hallucinations:  no  visual distortions: yes (frequent bats in the periphery). N/V:  no Lightheaded:  yes  Syncope: no Diplopia:  no but occasionally will look at words and some of the letters are missing.  Eye dr eval and negative.  No headache with it.  Last 10-30 min, about 1 time per month. Dyskinesia:  no Wearing off:  no  Neuroimaging has not previously been performed.   PREVIOUS MEDICATIONS: Sinemet and Sinemet CR  ALLERGIES:   Allergies  Allergen Reactions  . Hydromorphone Hcl Other (See Comments)    sob  . Morphine And Related Nausea And Vomiting and Other (See Comments)    headache    CURRENT MEDICATIONS:  Current Outpatient Prescriptions on File Prior to Visit  Medication Sig Dispense Refill  . ALPRAZolam (XANAX) 1 MG tablet Take 1 mg by mouth 3 (three) times daily.        . benazepril (LOTENSIN) 20 MG tablet Take 20 mg by mouth daily.        . carbidopa-levodopa (SINEMET CR) 50-200 MG per tablet Take 1 tablet by mouth daily.        . Fluticasone-Salmeterol (ADVAIR) 500-50 MCG/DOSE AEPB Inhale 1 puff into the lungs every 12 (twelve) hours.        Marland Kitchen HYDROcodone-acetaminophen (NORCO) 10-325 MG per tablet Take 1 tablet by mouth every 6 (six) hours as needed. For pain       . insulin glargine (LANTUS) 100 UNIT/ML injection Inject 100 Units into the skin at bedtime.      . primidone (MYSOLINE) 50 MG  tablet Take 1 tablet (50 mg total) by mouth at bedtime.  30 tablet  4  . simvastatin (ZOCOR) 20 MG tablet Take 20 mg by mouth at bedtime.         No current facility-administered medications on file prior to visit.    PAST MEDICAL HISTORY:   Past Medical History  Diagnosis Date  . Diabetes mellitus   . Hypertension   . Parkinson disease 2010  . Bleeding nose     PAST SURGICAL HISTORY:   Past Surgical History  Procedure Laterality Date  . Colon surgery      colectomy and reversal after diverticular ds  . Cholecystectomy    . Appendectomy    . Total knee arthroplasty      right  . Tonsillectomy      SOCIAL HISTORY:   History   Social History  . Marital Status: Single    Spouse Name: N/A    Number of Children: N/A  . Years of Education: N/A   Occupational History  . retired     Designer, fashion/clothing; [employer]   Social History Main Topics  . Smoking status: Former Smoker    Quit date: 03/20/1987  . Smokeless tobacco: Never Used  . Alcohol Use: No  . Drug Use: No  . Sexually Active: Not on file   Other Topics Concern  . Not on file   Social History Narrative  . No narrative on file    FAMILY HISTORY:   Family Status  Relation Status Death Age  . Mother Deceased     CA (lung)  . Father Deceased     EtOHic liver disease  . Sister Deceased     3, DM, cerebral aneurysm, pneumonia  . Brother Deceased     2, CAD, small cell lung CA  . Sister Alive     HTN, HLD, tremor  . Brother Alive     PD (? vascular)  . Child Alive     5, healthy    ROS:  A complete 10 system review of systems was obtained and was unremarkable apart from what is mentioned above.  PHYSICAL EXAMINATION:    VITALS:   Filed Vitals:   10/04/12 1231  BP: 110/72  Pulse: 84  Temp: 98.5 F (36.9 C)  Resp: 20  Weight: 248 lb (112.492 kg)    GEN:  The patient appears stated age and is in NAD. HEENT:  Normocephalic, atraumatic.  The mucous membranes are moist. The superficial  temporal arteries are without ropiness or tenderness. CV:  RRR with 2-3/6 SEM radiating to the L carotid. Lungs:  CTAB Neck/HEME:  There are no carotid bruits bilaterally.  Neurological examination:  Orientation: The patient is alert and oriented x3. Fund of knowledge is appropriate.  Recent and remote memory are intact.  Attention and concentration are normal.    Able to name objects and repeat phrases. Cranial nerves: There is good facial symmetry. Pupils are equal round and reactive to light bilaterally. Fundoscopic exam reveals clear margins bilaterally. Extraocular muscles are intact. There are no square wave jerks.  The visual fields are full to confrontational testing. The speech is fluent and clear. Soft palate rises symmetrically and there is no tongue deviation. Hearing is intact to conversational tone. Sensation: Sensation is intact to light and pinprick throughout (facial, trunk, extremities). Vibration is intact at the bilateral big toe. There is no extinction with double simultaneous stimulation. There is no sensory dermatomal level identified. Motor: Strength is 5/5 in the bilateral upper and lower extremities.   Shoulder shrug is equal and symmetric.  There is no pronator drift. Deep tendon reflexes: Deep tendon reflexes are 2-/4 at the bilateral biceps, triceps, brachioradialis, patella and achilles. Plantar responses are downgoing bilaterally.  Movement examination: Tone: There is normal tone in the bilateral upper extremities.  The tone in the lower extremities is normal.  Abnormal movements: She has mild head titubation in the "yes" direction.  She has no rest tremor.  She has tremor of the outstretched hand, moderate on the R and mild on the L. Coordination:  There is no decremation with RAM's, Including alternating supination and pronation of the forearm, hand opening and closing, finger taps, heel taps and toe taps. Gait and Station: The patient has no difficulty arising out  of a deep-seated chair without the use of the hands. The patient's stride length is normal but she does have some postural instability.  The patient has a negative pull test.    She has trouble with ambulating in a tandem fashion.  LABS:   Lab Results  Component Value Date   WBC 6.9 04/13/2011   HGB 14.1 04/13/2011   HCT 41.1 04/13/2011   MCV 85.6 04/13/2011   PLT 197 04/13/2011     Chemistry      Component Value Date/Time   NA 137 03/19/2012 1523   K 4.5 03/19/2012 1523   CL 104 03/19/2012 1523   CO2 29 03/19/2012 1523   BUN 18 03/19/2012 1523   CREATININE 0.9 03/19/2012 1523      Component Value Date/Time   CALCIUM 9.4 03/19/2012 1523   ALKPHOS 82 08/24/2010 1605   AST 13 08/24/2010 1605   ALT 15 08/24/2010 1605   BILITOT 0.2* 08/24/2010 1605     Lab Results  Component Value Date   VITAMINB12 252 06/05/2007     ASSESSMENT/PLAN:  1.  Tremor.  I still believe that the patient has essential tremor given the long-standing nature of the disorder and a very strong family history of essential tremor.  In addition, she has no rest component to the tremor.  She also has no bradykinesia or rigidity.  I am doubting her diagnosis of Parkinson's disease, but I did tell her that it is possible that her medication has treated her so well that it has covered the symptoms.    -For now, I am going to increase her primidone to 50 mg twice a day.    Risks, benefits, side effects and alternative therapies were discussed.  The opportunity to ask questions was given and they were answered to the best  of my ability.  The patient expressed understanding and willingness to follow the outlined treatment protocols.  -For now, she will continue the carbidopa/levodopa CR 50/200 at night only.  She was not able to get off of the medication completely but has weaned it significantly. 2.  Obstructive sleep apnea syndrome.     -she is on CPAP at  11 with a heated humidifier.  -The PSG did reveal frequent PLM's  but this was nondisruptive to sleep so it was opted not to treat this. 3.  I will plan on seeing her back in the office in the next 3-4 months, sooner should new neurologic issues arise.  In the meantime, I will try to get a copy of her lab work from her primary care physician.  She signed a record release in that regard.

## 2013-01-30 ENCOUNTER — Encounter: Payer: Self-pay | Admitting: Gastroenterology

## 2013-02-05 ENCOUNTER — Ambulatory Visit: Payer: Medicare Other | Admitting: Neurology

## 2013-04-01 ENCOUNTER — Emergency Department (HOSPITAL_COMMUNITY)
Admission: EM | Admit: 2013-04-01 | Discharge: 2013-04-01 | Disposition: A | Discharge status: Home / Self Care | Payer: Medicare Other | Source: Home / Self Care | Attending: Family Medicine | Admitting: Family Medicine

## 2013-04-01 ENCOUNTER — Encounter (HOSPITAL_COMMUNITY): Payer: Self-pay | Admitting: Emergency Medicine

## 2013-04-01 DIAGNOSIS — A0811 Acute gastroenteropathy due to Norwalk agent: Secondary | ICD-10-CM

## 2013-04-01 LAB — POCT I-STAT, CHEM 8
Calcium, Ion: 1.26 mmol/L (ref 1.13–1.30)
Glucose, Bld: 135 mg/dL — ABNORMAL HIGH (ref 70–99)
HCT: 42 % (ref 36.0–46.0)
Hemoglobin: 14.3 g/dL (ref 12.0–15.0)
Potassium: 3.9 mEq/L (ref 3.5–5.1)
TCO2: 20 mmol/L (ref 0–100)

## 2013-04-01 MED ORDER — LOPERAMIDE HCL 2 MG PO CAPS: 4.0000 mg | ORAL_CAPSULE | Freq: Four times a day (QID) | ORAL | Status: DC | PRN

## 2013-04-01 MED ORDER — ONDANSETRON HCL 4 MG PO TABS: 4.0000 mg | ORAL_TABLET | Freq: Four times a day (QID) | ORAL | Status: DC

## 2013-04-01 NOTE — ED Provider Notes (Signed)
CSN: [id number]     Arrival date & time 04/01/13  1736 History   First MD Initiated Contact with Patient 04/01/13 1841     Chief Complaint  Patient presents with  . Influenza   (Consider location/radiation/quality/duration/timing/severity/associated sxs/prior Treatment) Patient is a 67 y.o. female presenting with flu symptoms. The history is provided by the patient.  Influenza Presenting symptoms: diarrhea and nausea   Presenting symptoms: no fever and no vomiting   Severity:  Moderate Onset quality:  Gradual Duration:  4 days Progression:  Unchanged Chronicity:  New Relieved by:  Nothing Worsened by:  Nothing tried Associated symptoms: decreased appetite   Risk factors: being elderly, diabetes and sick contacts     Past Medical History  Diagnosis Date  . Diabetes mellitus   . Hypertension   . Parkinson disease 2010  . Bleeding nose    Past Surgical History  Procedure Laterality Date  . Colon surgery      colectomy and reversal after diverticular ds  . Cholecystectomy    . Appendectomy    . Total knee arthroplasty      right  . Tonsillectomy     History reviewed. No pertinent family history. History  Substance Use Topics  . Smoking status: Former Smoker    Quit date: 03/20/1987  . Smokeless tobacco: Never Used  . Alcohol Use: No   OB History   Grav Para Term Preterm Abortions TAB SAB Ect Mult Living                 Review of Systems  Constitutional: Positive for decreased appetite. Negative for fever.  Gastrointestinal: Positive for nausea and diarrhea. Negative for vomiting, abdominal pain, blood in stool and abdominal distention.    Allergies  Hydromorphone hcl and Morphine and related  Home Medications   Current Outpatient Rx  Name  Route  Sig  Dispense  Refill  . ALPRAZolam (XANAX) 1 MG tablet   Oral   Take 1 mg by mouth 3 (three) times daily.           . benazepril (LOTENSIN) 20 MG tablet   Oral   Take 20 mg by mouth daily.           .  carbidopa-levodopa (SINEMET CR) 50-200 MG per tablet   Oral   Take 1 tablet by mouth daily.           . Fluticasone-Salmeterol (ADVAIR) 500-50 MCG/DOSE AEPB   Inhalation   Inhale 1 puff into the lungs every 12 (twelve) hours.           Marland Kitchen HYDROcodone-acetaminophen (NORCO) 10-325 MG per tablet   Oral   Take 1 tablet by mouth every 6 (six) hours as needed. For pain          . insulin glargine (LANTUS) 100 UNIT/ML injection   Subcutaneous   Inject 100 Units into the skin at bedtime.         . primidone (MYSOLINE) 50 MG tablet   Oral   Take 1 tablet (50 mg total) by mouth 2 (two) times daily.   60 tablet   4   . simvastatin (ZOCOR) 20 MG tablet   Oral   Take 20 mg by mouth at bedtime.           Marland Kitchen loperamide (IMODIUM) 2 MG capsule   Oral   Take 2 capsules (4 mg total) by mouth 4 (four) times daily as needed for diarrhea or loose stools.   24 capsule  0   . ondansetron (ZOFRAN) 4 MG tablet   Oral   Take 1 tablet (4 mg total) by mouth every 6 (six) hours. Prn n/v   10 tablet   0   . oxyCODONE-acetaminophen (PERCOCET) 10-325 MG per tablet                BP 146/77  Pulse 99  Temp(Src) 98.8 F (37.1 C) (Oral)  Resp 16  SpO2 98% Physical Exam  Nursing note and vitals reviewed. Constitutional: She is oriented to person, place, and time. She appears well-developed and well-nourished.  HENT:  Mouth/Throat: Oropharynx is clear and moist.  Abdominal: Soft. Bowel sounds are normal. She exhibits no distension and no mass. There is no hepatosplenomegaly. There is generalized tenderness. There is no rigidity, no rebound, no guarding and no CVA tenderness.  Neurological: She is alert and oriented to person, place, and time.  Skin: Skin is warm and dry.    ED Course  Procedures (including critical care time) Labs Review Labs Reviewed  POCT I-STAT, CHEM 8 - Abnormal; Notable for the following:    Glucose, Bld 135 (*)    All other components within normal limits    Imaging Review No results found.  EKG Interpretation    Date/Time:    Ventricular Rate:    PR Interval:    QRS Duration:   QT Interval:    QTC Calculation:   R Axis:     Text Interpretation:              MDM  i-stat wnl    Linna Hoff, MD 04/04/13 2039

## 2013-04-01 NOTE — ED Notes (Signed)
C/o diarrhea. Body aches. Chills. Nausea. And fatigue.  Was seen by pcp today and was told that she needed to have fluids.  Denies vomiting and any other symptoms. On set of symptoms last Friday.

## 2013-07-11 ENCOUNTER — Emergency Department (HOSPITAL_COMMUNITY): Payer: Medicare Other

## 2013-07-11 ENCOUNTER — Encounter (HOSPITAL_COMMUNITY): Payer: Self-pay | Admitting: Emergency Medicine

## 2013-07-11 ENCOUNTER — Emergency Department (HOSPITAL_COMMUNITY)
Admission: EM | Admit: 2013-07-11 | Discharge: 2013-07-11 | Disposition: A | Discharge status: Home / Self Care | Payer: Medicare Other | Attending: Emergency Medicine | Admitting: Emergency Medicine

## 2013-07-11 DIAGNOSIS — Z79899 Other long term (current) drug therapy: Secondary | ICD-10-CM | POA: Insufficient documentation

## 2013-07-11 DIAGNOSIS — IMO0001 Reserved for inherently not codable concepts without codable children: Secondary | ICD-10-CM | POA: Insufficient documentation

## 2013-07-11 DIAGNOSIS — Z794 Long term (current) use of insulin: Secondary | ICD-10-CM | POA: Insufficient documentation

## 2013-07-11 DIAGNOSIS — R791 Abnormal coagulation profile: Secondary | ICD-10-CM | POA: Insufficient documentation

## 2013-07-11 DIAGNOSIS — E119 Type 2 diabetes mellitus without complications: Secondary | ICD-10-CM | POA: Insufficient documentation

## 2013-07-11 DIAGNOSIS — R7989 Other specified abnormal findings of blood chemistry: Secondary | ICD-10-CM

## 2013-07-11 DIAGNOSIS — M7989 Other specified soft tissue disorders: Secondary | ICD-10-CM

## 2013-07-11 DIAGNOSIS — Z87891 Personal history of nicotine dependence: Secondary | ICD-10-CM | POA: Insufficient documentation

## 2013-07-11 DIAGNOSIS — I10 Essential (primary) hypertension: Secondary | ICD-10-CM | POA: Insufficient documentation

## 2013-07-11 DIAGNOSIS — G20A1 Parkinson's disease without dyskinesia, without mention of fluctuations: Secondary | ICD-10-CM | POA: Insufficient documentation

## 2013-07-11 DIAGNOSIS — G20 Parkinson's disease: Secondary | ICD-10-CM | POA: Insufficient documentation

## 2013-07-11 LAB — CBC WITH DIFFERENTIAL/PLATELET
BASOS ABS: 0 10*3/uL (ref 0.0–0.1)
BASOS PCT: 0 % (ref 0–1)
EOS ABS: 0.3 10*3/uL (ref 0.0–0.7)
Eosinophils Relative: 3 % (ref 0–5)
HCT: 37.8 % (ref 36.0–46.0)
HEMOGLOBIN: 13 g/dL (ref 12.0–15.0)
Lymphocytes Relative: 31 % (ref 12–46)
Lymphs Abs: 2.5 10*3/uL (ref 0.7–4.0)
MCH: 29.5 pg (ref 26.0–34.0)
MCHC: 34.4 g/dL (ref 30.0–36.0)
MCV: 85.7 fL (ref 78.0–100.0)
MONOS PCT: 6 % (ref 3–12)
Monocytes Absolute: 0.5 10*3/uL (ref 0.1–1.0)
NEUTROS ABS: 4.9 10*3/uL (ref 1.7–7.7)
Neutrophils Relative %: 60 % (ref 43–77)
Platelets: 194 10*3/uL (ref 150–400)
RBC: 4.41 MIL/uL (ref 3.87–5.11)
RDW: 13.8 % (ref 11.5–15.5)
WBC: 8.2 10*3/uL (ref 4.0–10.5)

## 2013-07-11 LAB — BASIC METABOLIC PANEL
BUN: 15 mg/dL (ref 6–23)
CALCIUM: 9.6 mg/dL (ref 8.4–10.5)
CO2: 23 meq/L (ref 19–32)
CREATININE: 0.6 mg/dL (ref 0.50–1.10)
Chloride: 104 mEq/L (ref 96–112)
GFR calc Af Amer: >90 mL/min (ref >90)
GLUCOSE: 90 mg/dL (ref 70–99)
Potassium: 4.2 mEq/L (ref 3.7–5.3)
Sodium: 142 mEq/L (ref 137–147)

## 2013-07-11 LAB — D-DIMER, QUANTITATIVE (NOT AT ARMC): D-Dimer, Quant: 0.7 ug/mL-FEU — ABNORMAL HIGH (ref 0.00–0.48)

## 2013-07-11 LAB — PROTIME-INR
INR: 0.92 (ref 0.00–1.49)
Prothrombin Time: 12.2 seconds (ref 11.6–15.2)

## 2013-07-11 LAB — CBG MONITORING, ED: GLUCOSE-CAPILLARY: 97 mg/dL (ref 70–99)

## 2013-07-11 MED ORDER — ENOXAPARIN SODIUM 120 MG/0.8ML ~~LOC~~ SOLN
1.0000 mg/kg | Freq: Once | SUBCUTANEOUS | Status: AC
  Administered 2013-07-11: 110 mg via SUBCUTANEOUS
  Filled 2013-07-11: qty 0.8

## 2013-07-11 NOTE — ED Provider Notes (Signed)
CSN: [id number]     Arrival date & time 07/11/13  1755 History  This chart was scribed for non-physician practitioner, Cleatrice Burke, PA-C,working with Merryl Hacker, MD, by Marlowe Kays, ED Scribe.  This patient was seen in room TR11C/TR11C and the patient's care was started at 7:24 PM.  Chief Complaint  Patient presents with  . Arm Injury   The history is provided by the patient. No language interpreter was used.   HPI Comments:  [name] is a 68 y.o. female with h/o DM, HTN, and Parkinson's Disease who presents to the Emergency Department complaining of worsening right dorsal forearm pain and swelling that started approximately 5 days ago. She states twisting of the forearm at the elbow makes the pain worse. She reports associated paresthesias of the left forearm into her the fingers. She reports applying a brace she purchased that seemed to make the pain worse. She states she has taken her Percocet she normally takes for chronic back pain with no relief. She denies right elbow or shoulder pain. She denies numbness or loss of sensation of the left fingers. She denies fever, nausea, vomiting, or chills. She denies any injury or trauma to the arm. She has never had a DVT or PE in the past.   Past Medical History  Diagnosis Date  . Diabetes mellitus   . Hypertension   . Parkinson disease 2010  . Bleeding nose    Past Surgical History  Procedure Laterality Date  . Colon surgery      colectomy and reversal after diverticular ds  . Cholecystectomy    . Appendectomy    . Total knee arthroplasty      right  . Tonsillectomy     History reviewed. No pertinent family history. History  Substance Use Topics  . Smoking status: Former Smoker    Quit date: 03/20/1987  . Smokeless tobacco: Never Used  . Alcohol Use: No   OB History   Grav Para Term Preterm Abortions TAB SAB Ect Mult Living                 Review of Systems  Constitutional: Negative for fever and chills.   Gastrointestinal: Negative for nausea and vomiting.  Musculoskeletal: Positive for myalgias (right forearm).  Neurological: Negative for numbness.  All other systems reviewed and are negative.    Allergies  Hydromorphone hcl and Morphine and related  Home Medications   Current Outpatient Rx  Name  Route  Sig  Dispense  Refill  . ALPRAZolam (XANAX) 1 MG tablet   Oral   Take 1 mg by mouth 3 (three) times daily.           . benazepril (LOTENSIN) 20 MG tablet   Oral   Take 20 mg by mouth daily.           . carbidopa-levodopa (SINEMET CR) 50-200 MG per tablet   Oral   Take 1 tablet by mouth daily.           . Fluticasone-Salmeterol (ADVAIR) 500-50 MCG/DOSE AEPB   Inhalation   Inhale 1 puff into the lungs every 12 (twelve) hours.           Marland Kitchen HYDROcodone-acetaminophen (NORCO) 10-325 MG per tablet   Oral   Take 1 tablet by mouth every 6 (six) hours as needed. For pain          . insulin glargine (LANTUS) 100 UNIT/ML injection   Subcutaneous   Inject 100 Units into the  skin at bedtime.         Marland Kitchen loperamide (IMODIUM) 2 MG capsule   Oral   Take 2 capsules (4 mg total) by mouth 4 (four) times daily as needed for diarrhea or loose stools.   24 capsule   0   . ondansetron (ZOFRAN) 4 MG tablet   Oral   Take 1 tablet (4 mg total) by mouth every 6 (six) hours. Prn n/v   10 tablet   0   . oxyCODONE-acetaminophen (PERCOCET) 10-325 MG per tablet               . primidone (MYSOLINE) 50 MG tablet   Oral   Take 1 tablet (50 mg total) by mouth 2 (two) times daily.   60 tablet   4   . simvastatin (ZOCOR) 20 MG tablet   Oral   Take 20 mg by mouth at bedtime.            Triage Vitals: BP 142/59  Temp(Src) 98.4 F (36.9 C) (Oral)  Resp 18  Ht 5\' 4"  (1.626 m)  Wt 240 lb (108.863 kg)  BMI 41.18 kg/m2  SpO2 97% Physical Exam  Nursing note and vitals reviewed. Constitutional: She is oriented to person, place, and time. She appears well-developed and  well-nourished. No distress.  HENT:  Head: Normocephalic and atraumatic.  Right Ear: External ear normal.  Left Ear: External ear normal.  Nose: Nose normal.  Mouth/Throat: Oropharynx is clear and moist.  Eyes: Conjunctivae are normal.  Neck: Normal range of motion.  Cardiovascular: Normal rate, regular rhythm, normal heart sounds, intact distal pulses and normal pulses.   Pulses:      Radial pulses are 2+ on the right side, and 2+ on the left side.       Posterior tibial pulses are 2+ on the right side, and 2+ on the left side.  Pulmonary/Chest: Effort normal and breath sounds normal. No stridor. No respiratory distress. She has no wheezes. She has no rales.  Abdominal: Soft. She exhibits no distension.  Musculoskeletal: Normal range of motion. She exhibits edema and tenderness.  Mild swelling to right forearm. No erythema, induration, streaking. Increased pain with wrist extension.  No swelling or erythema of calves.   Neurological: She is alert and oriented to person, place, and time. She has normal strength.  Skin: Skin is warm and dry. She is not diaphoretic. No erythema.  Psychiatric: She has a normal mood and affect. Her behavior is normal.    ED Course  Procedures (including critical care time) DIAGNOSTIC STUDIES: Oxygen Saturation is 97% on RA, normal by my interpretation.   COORDINATION OF CARE: 7:36 PM- Will speak to Dr. Dina Rich about appropriate course of treatment. Pt verbalizes understanding and agrees to plan.  Medications  enoxaparin (LOVENOX) injection 110 mg (not administered)    Labs Review Labs Reviewed  D-DIMER, QUANTITATIVE - Abnormal; Notable for the following:    D-Dimer, Quant 0.70 (*)    All other components within normal limits  CBC WITH DIFFERENTIAL  PROTIME-INR  BASIC METABOLIC PANEL  CBG MONITORING, ED   Imaging Review Dg Forearm Right  07/11/2013   CLINICAL DATA:  Pain and swelling right arm for 3 days, no known injury  EXAM: RIGHT FOREARM  - 2 VIEW  COMPARISON:  None  FINDINGS: Osseous mineralization normal.  Joint spaces preserved.  No fracture, dislocation, or bone destruction.  IMPRESSION: Normal exam.   Electronically Signed   By: Crist Infante.D.  On: 07/11/2013 19:17   Dg Wrist Complete Right  07/11/2013   CLINICAL DATA:  Pain and swelling for an arm for 3 days, no known injury  EXAM: RIGHT WRIST - COMPLETE 3+ VIEW  COMPARISON:  None  FINDINGS: Minimal joint space narrowing at STT joint.  Tiny cyst at lunate.  Osseous mineralization otherwise normal.  Remaining joint spaces preserved.  No acute fracture, dislocation or bone destruction.  IMPRESSION: Minimal degenerative changes.  No acute abnormalities.   Electronically Signed   By: Lavonia Dana M.D.   On: 07/11/2013 19:18     EKG Interpretation None      MDM   Final diagnoses:  Arm swelling  Positive D dimer    Patient presents to ED with right arm pain and swelling not improved with rest and a brace. XRs show no acute abnormality. D dimer was done to r/o DVT given unilateral swelling. D dimer was evaluated. Patient was given Lovenox in ED although my suspicion is low this is a DVT. Patient will return tomorrow morning for [place] of right upper and lower extremity. She has no swelling or signs of DVT in her lower extremity, but late in the visit notes that sometimes her posterior calf aches. Again my suspicion is low, but with the elevated d dimer I believe she needs this ruled out. Strict return instructions given. Vital signs stable for discharge. Patient / Family / Caregiver informed of clinical course, understand medical decision-making process, and agree with plan.   I personally performed the services described in this documentation, which was scribed in my presence. The recorded information has been reviewed and is accurate.    Elwyn Lade, PA-C 07/12/13 1238

## 2013-07-11 NOTE — ED Notes (Signed)
Patient transported to X-ray 

## 2013-07-11 NOTE — Discharge Instructions (Signed)
Edema Edema is a buildup of fluids. It is most common in the feet, ankles, and legs. This happens more as a person ages. It may affect one or both legs. HOME CARE   Raise (elevate) the legs or ankles above the level of the heart while lying down.  Avoid sitting or standing still for a long time.  Exercise the legs to help the puffiness (swelling) go down.  A low-salt diet may help lessen the puffiness.  Only take medicine as told by your doctor. GET HELP RIGHT AWAY IF:   You develop shortness of breath or chest pain.  You cannot breathe when you lie down.  You have more puffiness that does not go away with treatment.  You develop pain or redness in the areas that are puffy.  You have a temperature by mouth above 102 F (38.9 C), not controlled by medicine.  You gain 03 lb/1.4 kg or more in 1 day or 05 lb/2.3 kg in a week. MAKE SURE YOU:   Understand these instructions.  Will watch your condition.  Will get help right away if you are not doing well or get worse. Document Released: 09/27/2007 Document Revised: 07/03/2011 Document Reviewed: 09/27/2007 [facility] Patient Information 2014 [place], [place].  Muscle Strain A muscle strain is an injury that occurs when a muscle is stretched beyond its normal length. Usually a small number of muscle fibers are torn when this happens. Muscle strain is rated in degrees. First-degree strains have the least amount of muscle fiber tearing and pain. Second-degree and third-degree strains have increasingly more tearing and pain.  Usually, recovery from muscle strain takes 1 2 weeks. Complete healing takes 5 6 weeks.  CAUSES  Muscle strain happens when a sudden, violent force placed on a muscle stretches it too far. This may occur with lifting, sports, or a fall.  RISK FACTORS Muscle strain is especially common in athletes.  SIGNS AND SYMPTOMS At the site of the muscle strain, there may be:  Pain.  Bruising.  Swelling.  Difficulty  using the muscle due to pain or lack of normal function. DIAGNOSIS  Your health care provider will perform a physical exam and ask about your medical history. TREATMENT  Often, the best treatment for a muscle strain is resting, icing, and applying cold compresses to the injured area.  HOME CARE INSTRUCTIONS   Use the PRICE method of treatment to promote muscle healing during the first 2 3 days after your injury. The PRICE method involves:  Protecting the muscle from being injured again.  Restricting your activity and resting the injured body part.  Icing your injury. To do this, put ice in a plastic bag. Place a towel between your skin and the bag. Then, apply the ice and leave it on from 15 20 minutes each hour. After the third day, switch to moist heat packs.  Apply compression to the injured area with a splint or elastic bandage. Be careful not to wrap it too tightly. This may interfere with blood circulation or increase swelling.  Elevate the injured body part above the level of your heart as often as you can.  Only take over-the-counter or prescription medicines for pain, discomfort, or fever as directed by your health care provider.  Warming up prior to exercise helps to prevent future muscle strains. SEEK MEDICAL CARE IF:   You have increasing pain or swelling in the injured area.  You have numbness, tingling, or a significant loss of strength in the injured area.  MAKE SURE YOU:   Understand these instructions.  Will watch your condition.  Will get help right away if you are not doing well or get worse. Document Released: 04/10/2005 Document Revised: 01/29/2013 Document Reviewed: 11/07/2012 [facility] Patient Information 2014 [place], [place].

## 2013-07-11 NOTE — ED Notes (Signed)
Pt here with right forearm pain that started Monday and has got worse each day, pt is able to move wrist but has some pain with that , pt does have a knot to the outside of the arm , pt is able to make a fist and move all fingers  She does have pain to palpation

## 2013-07-12 ENCOUNTER — Ambulatory Visit (HOSPITAL_COMMUNITY)
Admission: RE | Admit: 2013-07-12 | Discharge: 2013-07-12 | Disposition: A | Discharge status: Home / Self Care | Payer: Medicare Other | Source: Ambulatory Visit | Attending: Emergency Medicine | Admitting: Emergency Medicine

## 2013-07-12 DIAGNOSIS — M79609 Pain in unspecified limb: Secondary | ICD-10-CM

## 2013-07-12 NOTE — ED Provider Notes (Signed)
Medical screening examination/treatment/procedure(s) were performed by non-physician practitioner and as supervising physician I was immediately available for consultation/collaboration.   EKG Interpretation None       Merryl Hacker, MD 07/12/13 [phone]

## 2013-07-12 NOTE — Progress Notes (Signed)
VASCULAR LAB PRELIMINARY  PRELIMINARY  PRELIMINARY  PRELIMINARY  Right upper extremity venous Doppler completed.    Preliminary report:  There is no DVT or SVT noted in the right upper extremity.  Alanni Vader, RVT 07/12/2013, 8:24 AM

## 2013-07-13 NOTE — ED Provider Notes (Signed)
Upon chart review it does not appear patient received her lower extremity doppler. I discussed this with Candace from Ultrasound who was unable to see an order at the time she did her right upper extremity ultrasound. Today Candace and I were able to review the chart together and she was able to find the order. I called the patient and she is able to come in to get the study done tomorrow between 11am and 12pm.   Elwyn Lade, PA-C 07/13/13 1656

## 2013-07-14 ENCOUNTER — Ambulatory Visit (HOSPITAL_COMMUNITY)
Admission: RE | Admit: 2013-07-14 | Discharge: 2013-07-14 | Disposition: A | Discharge status: Home / Self Care | Payer: Medicare Other | Source: Ambulatory Visit | Attending: Emergency Medicine | Admitting: Emergency Medicine

## 2013-07-14 DIAGNOSIS — M7989 Other specified soft tissue disorders: Secondary | ICD-10-CM | POA: Insufficient documentation

## 2013-07-14 DIAGNOSIS — M79609 Pain in unspecified limb: Secondary | ICD-10-CM | POA: Insufficient documentation

## 2013-07-14 NOTE — Progress Notes (Signed)
VASCULAR LAB PRELIMINARY  PRELIMINARY  PRELIMINARY  PRELIMINARY  Right lower extremity venous duplex completed.    Preliminary report:  Right:  No evidence of DVT, superficial thrombosis, or Baker's cyst.  Yashar Inclan, RVS 07/14/2013, 11:25 AM

## 2013-07-14 NOTE — ED Provider Notes (Signed)
Medical screening examination/treatment/procedure(s) were performed by non-physician practitioner and as supervising physician I was immediately available for consultation/collaboration.   EKG Interpretation None       Merryl Hacker, MD 07/14/13 [phone]

## 2013-10-14 ENCOUNTER — Ambulatory Visit
Admission: RE | Admit: 2013-10-14 | Discharge: 2013-10-14 | Disposition: A | Discharge status: Home / Self Care | Payer: Medicare Other | Source: Ambulatory Visit | Attending: *Deleted | Admitting: *Deleted

## 2013-10-14 ENCOUNTER — Other Ambulatory Visit: Payer: Self-pay | Admitting: *Deleted

## 2013-10-14 DIAGNOSIS — R05 Cough: Secondary | ICD-10-CM

## 2013-10-14 DIAGNOSIS — R059 Cough, unspecified: Secondary | ICD-10-CM

## 2014-02-06 ENCOUNTER — Other Ambulatory Visit: Payer: Self-pay

## 2014-10-19 ENCOUNTER — Other Ambulatory Visit: Payer: Self-pay

## 2014-11-25 ENCOUNTER — Encounter: Payer: Self-pay | Admitting: Gastroenterology

## 2015-03-02 ENCOUNTER — Encounter (HOSPITAL_COMMUNITY): Payer: Self-pay | Admitting: *Deleted

## 2015-03-02 ENCOUNTER — Emergency Department (HOSPITAL_COMMUNITY): Payer: Medicare Other

## 2015-03-02 ENCOUNTER — Inpatient Hospital Stay (HOSPITAL_COMMUNITY)
Admission: EM | Admit: 2015-03-02 | Discharge: 2015-03-07 | DRG: 392 | Disposition: A | Discharge status: Home / Self Care | Payer: Medicare Other | Attending: Internal Medicine | Admitting: Internal Medicine

## 2015-03-02 DIAGNOSIS — Z66 Do not resuscitate: Secondary | ICD-10-CM | POA: Diagnosis present

## 2015-03-02 DIAGNOSIS — K529 Noninfective gastroenteritis and colitis, unspecified: Secondary | ICD-10-CM | POA: Diagnosis not present

## 2015-03-02 DIAGNOSIS — G4733 Obstructive sleep apnea (adult) (pediatric): Secondary | ICD-10-CM | POA: Diagnosis present

## 2015-03-02 DIAGNOSIS — F419 Anxiety disorder, unspecified: Secondary | ICD-10-CM | POA: Diagnosis present

## 2015-03-02 DIAGNOSIS — E119 Type 2 diabetes mellitus without complications: Secondary | ICD-10-CM | POA: Diagnosis present

## 2015-03-02 DIAGNOSIS — E876 Hypokalemia: Secondary | ICD-10-CM | POA: Diagnosis present

## 2015-03-02 DIAGNOSIS — G20 Parkinson's disease: Secondary | ICD-10-CM | POA: Diagnosis present

## 2015-03-02 DIAGNOSIS — E785 Hyperlipidemia, unspecified: Secondary | ICD-10-CM | POA: Diagnosis present

## 2015-03-02 DIAGNOSIS — Z833 Family history of diabetes mellitus: Secondary | ICD-10-CM

## 2015-03-02 DIAGNOSIS — K559 Vascular disorder of intestine, unspecified: Secondary | ICD-10-CM | POA: Diagnosis present

## 2015-03-02 DIAGNOSIS — Z87891 Personal history of nicotine dependence: Secondary | ICD-10-CM

## 2015-03-02 DIAGNOSIS — Z800 Family history of malignant neoplasm of digestive organs: Secondary | ICD-10-CM

## 2015-03-02 DIAGNOSIS — K219 Gastro-esophageal reflux disease without esophagitis: Secondary | ICD-10-CM | POA: Diagnosis present

## 2015-03-02 DIAGNOSIS — I10 Essential (primary) hypertension: Secondary | ICD-10-CM | POA: Diagnosis present

## 2015-03-02 DIAGNOSIS — J45909 Unspecified asthma, uncomplicated: Secondary | ICD-10-CM | POA: Diagnosis present

## 2015-03-02 LAB — URINALYSIS, ROUTINE W REFLEX MICROSCOPIC
Bilirubin Urine: NEGATIVE
Glucose, UA: NEGATIVE mg/dL
Hgb urine dipstick: NEGATIVE
Ketones, ur: NEGATIVE mg/dL
Leukocytes, UA: NEGATIVE
Nitrite: NEGATIVE
Protein, ur: NEGATIVE mg/dL
SPECIFIC GRAVITY, URINE: 1.013 (ref 1.005–1.030)
Urobilinogen, UA: 0.2 mg/dL (ref 0.0–1.0)
pH: 6 (ref 5.0–8.0)

## 2015-03-02 LAB — COMPREHENSIVE METABOLIC PANEL
ALT: 16 U/L (ref 14–54)
AST: 20 U/L (ref 15–41)
Albumin: 3.5 g/dL (ref 3.5–5.0)
Alkaline Phosphatase: 75 U/L (ref 38–126)
Anion gap: 10 (ref 5–15)
BILIRUBIN TOTAL: 0.4 mg/dL (ref 0.3–1.2)
BUN: 10 mg/dL (ref 6–20)
CALCIUM: 9.4 mg/dL (ref 8.9–10.3)
CO2: 27 mmol/L (ref 22–32)
CREATININE: 0.72 mg/dL (ref 0.44–1.00)
Chloride: 107 mmol/L (ref 101–111)
GFR calc Af Amer: >60 mL/min (ref >60)
GFR calc non Af Amer: >60 mL/min (ref >60)
GLUCOSE: 140 mg/dL — AB (ref 65–99)
Potassium: 4.6 mmol/L (ref 3.5–5.1)
SODIUM: 144 mmol/L (ref 135–145)
TOTAL PROTEIN: 6.9 g/dL (ref 6.5–8.1)

## 2015-03-02 LAB — CBC WITH DIFFERENTIAL/PLATELET
BASOS PCT: 0 %
Basophils Absolute: 0 10*3/uL (ref 0.0–0.1)
Eosinophils Absolute: 0.1 10*3/uL (ref 0.0–0.7)
Eosinophils Relative: 1 %
HEMATOCRIT: 39.3 % (ref 36.0–46.0)
HEMOGLOBIN: 13 g/dL (ref 12.0–15.0)
LYMPHS ABS: 2.1 10*3/uL (ref 0.7–4.0)
Lymphocytes Relative: 23 %
MCH: 28.4 pg (ref 26.0–34.0)
MCHC: 33.1 g/dL (ref 30.0–36.0)
MCV: 86 fL (ref 78.0–100.0)
Monocytes Absolute: 0.4 10*3/uL (ref 0.1–1.0)
Monocytes Relative: 4 %
Neutro Abs: 6.7 10*3/uL (ref 1.7–7.7)
Neutrophils Relative %: 72 %
Platelets: 206 10*3/uL (ref 150–400)
RBC: 4.57 MIL/uL (ref 3.87–5.11)
RDW: 14.1 % (ref 11.5–15.5)
WBC: 9.4 10*3/uL (ref 4.0–10.5)

## 2015-03-02 LAB — GLUCOSE, CAPILLARY: Glucose-Capillary: 97 mg/dL (ref 65–99)

## 2015-03-02 LAB — LIPASE, BLOOD: Lipase: 31 U/L (ref 11–51)

## 2015-03-02 MED ORDER — CIPROFLOXACIN IN D5W 400 MG/200ML IV SOLN
400.0000 mg | Freq: Once | INTRAVENOUS | Status: AC
  Administered 2015-03-02: 400 mg via INTRAVENOUS
  Filled 2015-03-02: qty 200

## 2015-03-02 MED ORDER — IOHEXOL 300 MG/ML  SOLN
100.0000 mL | Freq: Once | INTRAMUSCULAR | Status: AC | PRN
  Administered 2015-03-02: 100 mL via INTRAVENOUS

## 2015-03-02 MED ORDER — SODIUM CHLORIDE 0.9 % IV SOLN
1000.0000 mL | Freq: Once | INTRAVENOUS | Status: AC
  Administered 2015-03-02: 1000 mL via INTRAVENOUS

## 2015-03-02 MED ORDER — ONDANSETRON HCL 4 MG/2ML IJ SOLN
4.0000 mg | Freq: Once | INTRAMUSCULAR | Status: AC
  Administered 2015-03-02: 4 mg via INTRAVENOUS
  Filled 2015-03-02: qty 2

## 2015-03-02 MED ORDER — METRONIDAZOLE IN NACL 5-0.79 MG/ML-% IV SOLN
500.0000 mg | Freq: Once | INTRAVENOUS | Status: AC
  Administered 2015-03-02: 500 mg via INTRAVENOUS
  Filled 2015-03-02: qty 100

## 2015-03-02 MED ORDER — HYDROMORPHONE HCL 1 MG/ML IJ SOLN
0.5000 mg | INTRAMUSCULAR | Status: AC | PRN
  Administered 2015-03-02 (×3): 0.5 mg via INTRAVENOUS
  Filled 2015-03-02 (×3): qty 1

## 2015-03-02 MED ORDER — SODIUM CHLORIDE 0.9 % IV SOLN
1000.0000 mL | INTRAVENOUS | Status: DC
  Administered 2015-03-02: 1000 mL via INTRAVENOUS

## 2015-03-02 MED ORDER — ONDANSETRON 4 MG PO TBDP
8.0000 mg | ORAL_TABLET | Freq: Once | ORAL | Status: AC
  Administered 2015-03-02: 8 mg via ORAL
  Filled 2015-03-02: qty 2

## 2015-03-02 MED ORDER — PNEUMOCOCCAL VAC POLYVALENT 25 MCG/0.5ML IJ INJ
0.5000 mL | INJECTION | INTRAMUSCULAR | Status: AC
  Administered 2015-03-03: 0.5 mL via INTRAMUSCULAR
  Filled 2015-03-02: qty 0.5

## 2015-03-02 NOTE — Progress Notes (Signed)
Admission Note  [name], [name]  is a 69 y.o. female patient, arrived to floor in room 5W07 via stretcher, transferred from [facility]. Patient alert and oriented X 4. Vital signs - Oral temperature 98.4 F (36.9 C), Blood pressure 134/68, Pulse 75, RR 18, SpO2 95 % on room air. Height 5'4" (162.6 cm), weight 266 lbs (120.87 kg). Complains of intermittent pain 7/10 over bilateral lower abdomen. Skin intact, no pressure ulcer noted in sacral area.   Patient's ID armband verified with patient/ family, and in place. Information packet given to patient/ family. Fall risk assessed, SR up X2, patient/ family able to verbalize understanding of risks associated with falls and to call nurse or staff to assist before getting out of bed. Patient/ family oriented to room and equipment. Call bell within reach.

## 2015-03-02 NOTE — ED Provider Notes (Signed)
CSN: [id number]     Arrival date & time 03/02/15  1554 History   First MD Initiated Contact with Patient 03/02/15 1856     Chief Complaint  Patient presents with  . Abdominal Pain   HPI Pt started having pain last night.  The symptoms have gotten worse throughout the day.  The pain is all over and will move toward the right upper side.  Nausea without vomiting.  Decreased BM this am.  No fever or trouble urinating.  She has history of a colostomy that was reversed and prior bowel obstruction.  Past Medical History  Diagnosis Date  . Diabetes mellitus   . Hypertension   . Parkinson disease ([place]) 2010  . Bleeding nose    Past Surgical History  Procedure Laterality Date  . Colon surgery      colectomy and reversal after diverticular ds  . Cholecystectomy    . Appendectomy    . Total knee arthroplasty      right  . Tonsillectomy     No family history on file. Social History  Substance Use Topics  . Smoking status: Former Smoker    Quit date: 03/20/1987  . Smokeless tobacco: Never Used  . Alcohol Use: No   OB History    No data available     Review of Systems  Constitutional: Negative for fever.  Respiratory: Negative for shortness of breath.   Genitourinary: Negative for dysuria.  All other systems reviewed and are negative.     Allergies  Morphine and related  Home Medications   Prior to Admission medications   Medication Sig Start Date End Date Taking? Authorizing Provider  ALPRAZolam Duanne Moron) 1 MG tablet Take 1 mg by mouth 3 (three) times daily.     Yes Historical Provider, MD  amLODipine (NORVASC) 10 MG tablet Take 10 mg by mouth daily.   Yes Historical Provider, MD  benazepril (LOTENSIN) 20 MG tablet Take 20 mg by mouth daily.     Yes Historical Provider, MD  carbidopa-levodopa (SINEMET CR) 50-200 MG per tablet Take 1 tablet by mouth daily.     Yes Historical Provider, MD  DEXILANT 60 MG capsule Take 1 capsule by mouth daily as needed. For acid reflux 06/24/13   Yes Historical Provider, MD  Fluticasone-Salmeterol (ADVAIR) 500-50 MCG/DOSE AEPB Inhale 1 puff into the lungs every 12 (twelve) hours.     Yes Historical Provider, MD  insulin glargine (LANTUS) 100 UNIT/ML injection Inject 150 Units into the skin at bedtime.    Yes Historical Provider, MD  oxyCODONE-acetaminophen (PERCOCET) 10-325 MG per tablet Take 1 tablet by mouth every 6 (six) hours as needed for pain.  10/03/12  Yes Historical Provider, MD  PROAIR HFA 108 (90 BASE) MCG/ACT inhaler Inhale 1 puff into the lungs every 4 (four) hours as needed. For shortness of breath 06/24/13  Yes Historical Provider, MD  sertraline (ZOLOFT) 100 MG tablet Take 100 mg by mouth daily. 06/29/13  Yes Historical Provider, MD  simvastatin (ZOCOR) 20 MG tablet Take 20 mg by mouth at bedtime.     Yes Historical Provider, MD   BP 142/66 mmHg  Pulse 82  Temp(Src) 98.1 F (36.7 C)  Resp 22  Ht '5\' 4"'$  (1.626 m)  Wt 256 lb (116.121 kg)  BMI 43.92 kg/m2 Physical Exam  Constitutional: She appears well-developed and well-nourished. No distress.  HENT:  Head: Normocephalic and atraumatic.  Right Ear: External ear normal.  Left Ear: External ear normal.  Eyes: Conjunctivae are normal.  Right eye exhibits no discharge. Left eye exhibits no discharge. No scleral icterus.  Neck: Neck supple. No tracheal deviation present.  Cardiovascular: Normal rate, regular rhythm and intact distal pulses.   Pulmonary/Chest: Effort normal and breath sounds normal. No stridor. No respiratory distress. She has no wheezes. She has no rales.  Abdominal: Soft. Bowel sounds are normal. She exhibits no distension. There is tenderness in the right upper quadrant and right lower quadrant. There is no rigidity and no rebound. No hernia.  Musculoskeletal: She exhibits no edema or tenderness.  Neurological: She is alert. She has normal strength. No cranial nerve deficit (no facial droop, extraocular movements intact, no slurred speech) or sensory deficit.  She exhibits normal muscle tone. She displays no seizure activity. Coordination normal.  Skin: Skin is warm and dry. No rash noted.  Psychiatric: She has a normal mood and affect.  Nursing note and vitals reviewed.   ED Course  Procedures (including critical care time) Labs Review Labs Reviewed  COMPREHENSIVE METABOLIC PANEL - Abnormal; Notable for the following:    Glucose, Bld 140 (*)    All other components within normal limits  LIPASE, BLOOD  URINALYSIS, ROUTINE W REFLEX MICROSCOPIC (NOT AT [facility])  CBC WITH DIFFERENTIAL/PLATELET    Imaging Review Ct Abdomen Pelvis W Contrast  03/02/2015  CLINICAL DATA:  Abdominal pain since last night. EXAM: CT ABDOMEN AND PELVIS WITH CONTRAST TECHNIQUE: Multidetector CT imaging of the abdomen and pelvis was performed using the standard protocol following bolus administration of intravenous contrast. CONTRAST:  174m OMNIPAQUE IOHEXOL 300 MG/ML  SOLN COMPARISON:  CT 04/13/2011 FINDINGS: Lower chest: The included lung bases are clear. Tiny subpleural 3 mm nodule in the right lower lobe. Liver: Decreased density consistent with steatosis. No focal lesion. Hepatobiliary: Postcholecystectomy with clips in the gallbladder fossa. Unchanged biliary prominence, sequela of cholecystectomy. Pancreas: Normal. Spleen: Normal. Adrenal glands: No nodule. Kidneys: Symmetric renal enhancement and excretion. No hydronephrosis. Stomach/Bowel: Stomach is decompressed. There are no dilated or thickened small bowel loops. Moderate segment of colonic wall thickening with adjacent pericolonic inflammatory change at the hepatic flexure of the colon. Scattered diverticular throughout the distal colon. No diverticulitis. Enteric chain sutures noted in the sigmoid colon. The appendix is surgically absent. Vascular/Lymphatic: No retroperitoneal adenopathy. Abdominal aorta is normal in caliber. Moderate atherosclerosis. Reproductive: Uterus is surgically absent. Left ovary quiescent,  right ovary not definitively seen. No adnexal mass. Bladder: Minimally distended, not well evaluated. Other: No free air, free fluid, or intra-abdominal fluid collection. Fat containing umbilical hernia, no bowel involvement. Small fat containing supraumbilical ventral abdominal wall hernia, which contains small focus of transverse colon, no wall thickening at this site. Musculoskeletal: There are no acute or suspicious osseous abnormalities. Multilevel facet arthropathy in the lower lumbar spine. Scattered degenerative disc disease. IMPRESSION: 1. Colitis involving the hepatic flexure of the colon, this may be infectious, inflammatory, or ischemic given location. 2. Small Richter hernia of the supraumbilical abdominal wall without bowel compromise. 3. Incidental findings of diverticulosis and hepatic steatosis. Electronically Signed   By: MJeb LeveringM.D.   On: 03/02/2015 21:09   I have personally reviewed and evaluated these images and lab results as part of my medical decision-making.  Medications  0.9 %  sodium chloride infusion (1,000 mLs Intravenous New Bag/Given 03/02/15 1931)    Followed by  0.9 %  sodium chloride infusion (not administered)  HYDROmorphone (DILAUDID) injection 0.5 mg (0.5 mg Intravenous Given 03/02/15 2054)  ciprofloxacin (CIPRO) IVPB 400 mg (not administered)  metroNIDAZOLE (FLAGYL) IVPB 500 mg (not administered)  ondansetron (ZOFRAN-ODT) disintegrating tablet 8 mg (8 mg Oral Given 03/02/15 1622)  ondansetron (ZOFRAN) injection 4 mg (4 mg Intravenous Given 03/02/15 1931)  iohexol (OMNIPAQUE) 300 MG/ML solution 100 mL (100 mLs Intravenous Contrast Given 03/02/15 2022)     MDM   Final diagnoses:  Colitis    The patient presented to the emergency room with complaints of abdominal pain.  Laboratory tests are unremarkable however the CT scan showed colitis involving the hepatic flexure of the colon. Suspect infectious but she does not have an elevated WBC count or fever.  The patient continues to have abdominal discomfort. I will consult with medical service for admission. IV antibiotics have been started.    Dorie Rank, MD 03/03/15 1105

## 2015-03-02 NOTE — ED Notes (Signed)
Pt stating she is in a lot of pain, [place] P(RN) notified.

## 2015-03-02 NOTE — ED Notes (Signed)
The pt is c/o abd pain since last pm. She thinks she has a bowel blockage she had her last bm thus am  Only a small amount.  Nausea.  Hx of sbo   Colostomy 2010 that has been reclosed 3 months after  The surgery

## 2015-03-02 NOTE — Progress Notes (Signed)
Received report from [place], [place] from ED. Patient is getting Cipro IVPB. Awaiting for patient to be transferred to 5W.

## 2015-03-02 NOTE — Progress Notes (Signed)
Hal Hope MD at bedside, patient requests for something to eat or drink. MD ordered patient to be on full liquid diet.

## 2015-03-02 NOTE — Progress Notes (Signed)
[facility] Admissions paged for admitting MD to see patient.

## 2015-03-02 NOTE — ED Notes (Signed)
The pt takes percocet for sciatic nerve problems.

## 2015-03-03 DIAGNOSIS — E119 Type 2 diabetes mellitus without complications: Secondary | ICD-10-CM | POA: Diagnosis present

## 2015-03-03 DIAGNOSIS — K559 Vascular disorder of intestine, unspecified: Secondary | ICD-10-CM | POA: Diagnosis present

## 2015-03-03 DIAGNOSIS — J45909 Unspecified asthma, uncomplicated: Secondary | ICD-10-CM | POA: Diagnosis present

## 2015-03-03 DIAGNOSIS — Z800 Family history of malignant neoplasm of digestive organs: Secondary | ICD-10-CM | POA: Diagnosis not present

## 2015-03-03 DIAGNOSIS — Z66 Do not resuscitate: Secondary | ICD-10-CM | POA: Diagnosis present

## 2015-03-03 DIAGNOSIS — F419 Anxiety disorder, unspecified: Secondary | ICD-10-CM | POA: Diagnosis present

## 2015-03-03 DIAGNOSIS — Z87891 Personal history of nicotine dependence: Secondary | ICD-10-CM | POA: Diagnosis not present

## 2015-03-03 DIAGNOSIS — K219 Gastro-esophageal reflux disease without esophagitis: Secondary | ICD-10-CM | POA: Diagnosis present

## 2015-03-03 DIAGNOSIS — K529 Noninfective gastroenteritis and colitis, unspecified: Secondary | ICD-10-CM | POA: Diagnosis present

## 2015-03-03 DIAGNOSIS — G4733 Obstructive sleep apnea (adult) (pediatric): Secondary | ICD-10-CM | POA: Diagnosis present

## 2015-03-03 DIAGNOSIS — Z833 Family history of diabetes mellitus: Secondary | ICD-10-CM | POA: Diagnosis not present

## 2015-03-03 DIAGNOSIS — I10 Essential (primary) hypertension: Secondary | ICD-10-CM | POA: Diagnosis present

## 2015-03-03 DIAGNOSIS — E785 Hyperlipidemia, unspecified: Secondary | ICD-10-CM | POA: Diagnosis present

## 2015-03-03 DIAGNOSIS — E876 Hypokalemia: Secondary | ICD-10-CM | POA: Diagnosis present

## 2015-03-03 DIAGNOSIS — G20 Parkinson's disease: Secondary | ICD-10-CM | POA: Diagnosis present

## 2015-03-03 LAB — GLUCOSE, CAPILLARY
GLUCOSE-CAPILLARY: 80 mg/dL (ref 65–99)
GLUCOSE-CAPILLARY: 63 mg/dL — AB (ref 65–99)
GLUCOSE-CAPILLARY: 69 mg/dL (ref 65–99)
Glucose-Capillary: 133 mg/dL — ABNORMAL HIGH (ref 65–99)
Glucose-Capillary: 113 mg/dL — ABNORMAL HIGH (ref 65–99)
Glucose-Capillary: 130 mg/dL — ABNORMAL HIGH (ref 65–99)

## 2015-03-03 LAB — COMPREHENSIVE METABOLIC PANEL
ALBUMIN: 3.1 g/dL — AB (ref 3.5–5.0)
ALT: 14 U/L (ref 14–54)
AST: 19 U/L (ref 15–41)
Alkaline Phosphatase: 61 U/L (ref 38–126)
Anion gap: 8 (ref 5–15)
BILIRUBIN TOTAL: 0.4 mg/dL (ref 0.3–1.2)
BUN: 8 mg/dL (ref 6–20)
CHLORIDE: 108 mmol/L (ref 101–111)
CO2: 26 mmol/L (ref 22–32)
CREATININE: 0.67 mg/dL (ref 0.44–1.00)
Calcium: 8.7 mg/dL — ABNORMAL LOW (ref 8.9–10.3)
GFR calc Af Amer: >60 mL/min (ref >60)
GLUCOSE: 111 mg/dL — AB (ref 65–99)
POTASSIUM: 3.3 mmol/L — AB (ref 3.5–5.1)
Sodium: 142 mmol/L (ref 135–145)
Total Protein: 6 g/dL — ABNORMAL LOW (ref 6.5–8.1)

## 2015-03-03 LAB — CBC WITH DIFFERENTIAL/PLATELET
Basophils Absolute: 0 10*3/uL (ref 0.0–0.1)
Basophils Relative: 0 %
EOS ABS: 0.2 10*3/uL (ref 0.0–0.7)
EOS PCT: 2 %
HCT: 36.5 % (ref 36.0–46.0)
Hemoglobin: 11.9 g/dL — ABNORMAL LOW (ref 12.0–15.0)
LYMPHS ABS: 2.2 10*3/uL (ref 0.7–4.0)
LYMPHS PCT: 29 %
MCH: 28.3 pg (ref 26.0–34.0)
MCHC: 32.6 g/dL (ref 30.0–36.0)
MCV: 86.7 fL (ref 78.0–100.0)
MONO ABS: 0.4 10*3/uL (ref 0.1–1.0)
Monocytes Relative: 5 %
Neutro Abs: 4.9 10*3/uL (ref 1.7–7.7)
Neutrophils Relative %: 64 %
PLATELETS: 174 10*3/uL (ref 150–400)
RBC: 4.21 MIL/uL (ref 3.87–5.11)
RDW: 14.2 % (ref 11.5–15.5)
WBC: 7.6 10*3/uL (ref 4.0–10.5)

## 2015-03-03 LAB — LACTIC ACID, PLASMA: Lactic Acid, Venous: 1.9 mmol/L (ref 0.5–2.0)

## 2015-03-03 MED ORDER — ALBUTEROL SULFATE (2.5 MG/3ML) 0.083% IN NEBU: 3.0000 mL | INHALATION_SOLUTION | RESPIRATORY_TRACT | Status: DC | PRN

## 2015-03-03 MED ORDER — SERTRALINE HCL 100 MG PO TABS
100.0000 mg | ORAL_TABLET | Freq: Every day | ORAL | Status: DC
  Administered 2015-03-03 – 2015-03-07 (×5): 100 mg via ORAL
  Filled 2015-03-03 (×5): qty 1

## 2015-03-03 MED ORDER — INSULIN ASPART 100 UNIT/ML ~~LOC~~ SOLN
0.0000 [IU] | Freq: Three times a day (TID) | SUBCUTANEOUS | Status: DC
  Administered 2015-03-03: 1 [IU] via SUBCUTANEOUS
  Administered 2015-03-04: 3 [IU] via SUBCUTANEOUS
  Administered 2015-03-05: 1 [IU] via SUBCUTANEOUS
  Administered 2015-03-05: 3 [IU] via SUBCUTANEOUS
  Administered 2015-03-06: 2 [IU] via SUBCUTANEOUS
  Administered 2015-03-06: 1 [IU] via SUBCUTANEOUS

## 2015-03-03 MED ORDER — ACETAMINOPHEN 650 MG RE SUPP: 650.0000 mg | Freq: Four times a day (QID) | RECTAL | Status: DC | PRN

## 2015-03-03 MED ORDER — DEXTROSE-NACL 5-0.9 % IV SOLN
INTRAVENOUS | Status: DC
  Administered 2015-03-03: 1000 mL via INTRAVENOUS

## 2015-03-03 MED ORDER — INSULIN GLARGINE 100 UNIT/ML ~~LOC~~ SOLN
75.0000 [IU] | Freq: Every day | SUBCUTANEOUS | Status: DC
  Filled 2015-03-03: qty 0.75

## 2015-03-03 MED ORDER — AMLODIPINE BESYLATE 10 MG PO TABS
10.0000 mg | ORAL_TABLET | Freq: Every day | ORAL | Status: DC
  Administered 2015-03-03 – 2015-03-07 (×5): 10 mg via ORAL
  Filled 2015-03-03 (×5): qty 1

## 2015-03-03 MED ORDER — METRONIDAZOLE IN NACL 5-0.79 MG/ML-% IV SOLN
500.0000 mg | Freq: Three times a day (TID) | INTRAVENOUS | Status: DC
Start: 2015-03-03 — End: 2015-03-07
  Administered 2015-03-03 – 2015-03-07 (×13): 500 mg via INTRAVENOUS
  Filled 2015-03-03 (×13): qty 100

## 2015-03-03 MED ORDER — BENAZEPRIL HCL 20 MG PO TABS
20.0000 mg | ORAL_TABLET | Freq: Every day | ORAL | Status: DC
  Administered 2015-03-03 – 2015-03-07 (×4): 20 mg via ORAL
  Filled 2015-03-03 (×6): qty 1

## 2015-03-03 MED ORDER — MORPHINE SULFATE (PF) 2 MG/ML IV SOLN
1.0000 mg | INTRAVENOUS | Status: DC | PRN
  Administered 2015-03-03 – 2015-03-06 (×7): 1 mg via INTRAVENOUS
  Filled 2015-03-03 (×7): qty 1

## 2015-03-03 MED ORDER — ONDANSETRON HCL 4 MG/2ML IJ SOLN
4.0000 mg | Freq: Four times a day (QID) | INTRAMUSCULAR | Status: DC | PRN
  Administered 2015-03-06: 4 mg via INTRAVENOUS
  Filled 2015-03-03: qty 2

## 2015-03-03 MED ORDER — ALPRAZOLAM 0.5 MG PO TABS
1.0000 mg | ORAL_TABLET | Freq: Three times a day (TID) | ORAL | Status: DC
  Administered 2015-03-03 – 2015-03-07 (×13): 1 mg via ORAL
  Filled 2015-03-03 (×13): qty 2

## 2015-03-03 MED ORDER — INSULIN GLARGINE 100 UNIT/ML ~~LOC~~ SOLN
40.0000 [IU] | Freq: Every day | SUBCUTANEOUS | Status: DC
  Filled 2015-03-03: qty 0.4

## 2015-03-03 MED ORDER — PANTOPRAZOLE SODIUM 40 MG PO TBEC
40.0000 mg | DELAYED_RELEASE_TABLET | Freq: Every day | ORAL | Status: DC
  Administered 2015-03-03 – 2015-03-07 (×5): 40 mg via ORAL
  Filled 2015-03-03 (×5): qty 1

## 2015-03-03 MED ORDER — ONDANSETRON HCL 4 MG PO TABS: 4.0000 mg | ORAL_TABLET | Freq: Four times a day (QID) | ORAL | Status: DC | PRN

## 2015-03-03 MED ORDER — POTASSIUM CHLORIDE CRYS ER 20 MEQ PO TBCR
40.0000 meq | EXTENDED_RELEASE_TABLET | Freq: Once | ORAL | Status: AC
  Administered 2015-03-03: 40 meq via ORAL
  Filled 2015-03-03: qty 2

## 2015-03-03 MED ORDER — CIPROFLOXACIN IN D5W 400 MG/200ML IV SOLN
400.0000 mg | Freq: Two times a day (BID) | INTRAVENOUS | Status: DC
Start: 2015-03-03 — End: 2015-03-07
  Administered 2015-03-03 – 2015-03-07 (×9): 400 mg via INTRAVENOUS
  Filled 2015-03-03 (×9): qty 200

## 2015-03-03 MED ORDER — MOMETASONE FURO-FORMOTEROL FUM 200-5 MCG/ACT IN AERO
2.0000 | INHALATION_SPRAY | Freq: Two times a day (BID) | RESPIRATORY_TRACT | Status: DC
  Administered 2015-03-03 – 2015-03-06 (×7): 2 via RESPIRATORY_TRACT
  Filled 2015-03-03: qty 8.8

## 2015-03-03 MED ORDER — CARBIDOPA-LEVODOPA ER 50-200 MG PO TBCR
1.0000 | EXTENDED_RELEASE_TABLET | Freq: Every day | ORAL | Status: DC
  Administered 2015-03-03 – 2015-03-07 (×5): 1 via ORAL
  Filled 2015-03-03 (×7): qty 1

## 2015-03-03 MED ORDER — DEXTROSE 50 % IV SOLN
INTRAVENOUS | Status: AC
  Administered 2015-03-03: 25 mL
  Filled 2015-03-03: qty 50

## 2015-03-03 MED ORDER — ENOXAPARIN SODIUM 40 MG/0.4ML ~~LOC~~ SOLN
40.0000 mg | SUBCUTANEOUS | Status: DC
  Administered 2015-03-03 – 2015-03-04 (×2): 40 mg via SUBCUTANEOUS
  Filled 2015-03-03 (×2): qty 0.4

## 2015-03-03 MED ORDER — INSULIN GLARGINE 100 UNIT/ML ~~LOC~~ SOLN
100.0000 [IU] | Freq: Every day | SUBCUTANEOUS | Status: DC
  Administered 2015-03-03: 100 [IU] via SUBCUTANEOUS
  Filled 2015-03-03 (×2): qty 1

## 2015-03-03 MED ORDER — SIMVASTATIN 20 MG PO TABS
20.0000 mg | ORAL_TABLET | Freq: Every day | ORAL | Status: DC
  Administered 2015-03-03 – 2015-03-06 (×5): 20 mg via ORAL
  Filled 2015-03-03 (×5): qty 1

## 2015-03-03 MED ORDER — SODIUM CHLORIDE 0.9 % IV SOLN
INTRAVENOUS | Status: AC
  Administered 2015-03-03: 1000 mL via INTRAVENOUS
  Administered 2015-03-03: 01:00:00 via INTRAVENOUS

## 2015-03-03 MED ORDER — DEXTROSE 5 % IV SOLN
INTRAVENOUS | Status: DC
  Administered 2015-03-03: via INTRAVENOUS

## 2015-03-03 MED ORDER — ACETAMINOPHEN 325 MG PO TABS
650.0000 mg | ORAL_TABLET | Freq: Four times a day (QID) | ORAL | Status: DC | PRN
  Administered 2015-03-05: 650 mg via ORAL
  Filled 2015-03-03: qty 2

## 2015-03-03 NOTE — Progress Notes (Signed)
RT set up CPAP for patient and explained how to turn it on. Patient states she will place herself on when ready. RT will continue to monitor.

## 2015-03-03 NOTE — Care Management Note (Signed)
Case Management Note  Patient Details  Name: [name] MRN: [id number] Date of Birth: 07/26/1945  Subjective/Objective:                 Patient admitted from home. Lives with family. Admitted with colitis. Receiving IV Abx, IVF at 125, slowly progressing diet. PT evaluation pending.   Action/Plan:  Will continue to monitor and offer [facility] resources as needed.  Expected Discharge Date:                  Expected Discharge Plan:  Home/Self Care  In-House Referral:     Discharge planning Services  CM Consult  Post Acute Care Choice:    Choice offered to:     DME Arranged:    DME Agency:     HH Arranged:    HH Agency:     Status of Service:  In process, will continue to follow  Medicare Important Message Given:    Date Medicare IM Given:    Medicare IM give by:    Date Additional Medicare IM Given:    Additional Medicare Important Message give by:     If discussed at [place] of Stay Meetings, dates discussed:    Additional Comments:  Carles Collet, RN 03/03/2015, 2:49 PM

## 2015-03-03 NOTE — Progress Notes (Signed)
ANTIBIOTIC CONSULT NOTE - INITIAL  Pharmacy Consult for Ciprofloxacin  Indication: Intra-abdominal   Allergies  Allergen Reactions  . Morphine And Related Nausea And Vomiting and Other (See Comments)    headache    Patient Measurements: Height: '5\' 4"'$  (162.6 cm) Weight: 266 lb 7.5 oz (120.87 kg) IBW/kg (Calculated) : 54.7  Vital Signs: Temp: 98.4 F (36.9 C) (11/08 2249) Temp Source: Oral (11/08 2249) BP: 134/68 mmHg (11/08 2249) Pulse Rate: 75 (11/08 2249) Intake/Output from previous day: 11/08 0701 - 11/09 0700 In: 240 [P.O.:240] Out: -  Intake/Output from this shift: Total I/O In: 240 [P.O.:240] Out: -   Labs:  Recent Labs  03/02/15 1619  WBC 9.4  HGB 13.0  PLT 206  CREATININE 0.72   Estimated Creatinine Clearance: 87.5 mL/min (by C-G formula based on Cr of 0.72). No results for input(s): VANCOTROUGH, VANCOPEAK, VANCORANDOM, GENTTROUGH, GENTPEAK, GENTRANDOM, TOBRATROUGH, TOBRAPEAK, TOBRARND, AMIKACINPEAK, AMIKACINTROU, AMIKACIN in the last 72 hours.   Microbiology: No results found for this or any previous visit (from the past 720 hour(s)).  Medical History: Past Medical History  Diagnosis Date  . Diabetes mellitus   . Hypertension   . Parkinson disease ([employer]) 2010  . Bleeding nose     Medications:  Prescriptions prior to admission  Medication Sig Dispense Refill Last Dose  . ALPRAZolam (XANAX) 1 MG tablet Take 1 mg by mouth 3 (three) times daily.     03/02/2015 at Unknown time  . amLODipine (NORVASC) 10 MG tablet Take 10 mg by mouth daily.   03/02/2015 at Unknown time  . benazepril (LOTENSIN) 20 MG tablet Take 20 mg by mouth daily.     03/02/2015 at Unknown time  . carbidopa-levodopa (SINEMET CR) 50-200 MG per tablet Take 1 tablet by mouth daily.     03/02/2015 at Unknown time  . DEXILANT 60 MG capsule Take 1 capsule by mouth daily as needed. For acid reflux   03/02/2015 at Unknown time  . Fluticasone-Salmeterol (ADVAIR) 500-50 MCG/DOSE AEPB Inhale 1 puff  into the lungs every 12 (twelve) hours.     03/02/2015 at Unknown time  . insulin glargine (LANTUS) 100 UNIT/ML injection Inject 150 Units into the skin at bedtime.    03/01/2015 at Unknown time  . oxyCODONE-acetaminophen (PERCOCET) 10-325 MG per tablet Take 1 tablet by mouth every 6 (six) hours as needed for pain.    03/01/2015 at Unknown time  . PROAIR HFA 108 (90 BASE) MCG/ACT inhaler Inhale 1 puff into the lungs every 4 (four) hours as needed. For shortness of breath   03/01/2015 at Unknown time  . sertraline (ZOLOFT) 100 MG tablet Take 100 mg by mouth daily.   03/01/2015 at Unknown time  . simvastatin (ZOCOR) 20 MG tablet Take 20 mg by mouth at bedtime.     03/01/2015 at Unknown time   Assessment: 67 YOF with abdominal pain and associated nausea without vomiting. CT scan showed colitis. Pharmacy consulted to start IV Ciprofloxacin. WBC wnl. Afebrile. CrCl ~ 88 mL/min   Goal of Therapy:  Resolution of infection   Plan:  -Ciprofloxacin 400 mg Q 12 hours  -Monitor CBC, renal fx, cultures and clinical progress   Albertina Parr, PharmD., BCPS Clinical Pharmacist Pager [phone]

## 2015-03-03 NOTE — Progress Notes (Signed)
PATIENT DETAILS Name: [name] Age: 69 y.o. Sex: female Date of Birth: 1946-04-03 Admit Date: 03/02/2015 Admitting Physician Rise Patience, MD KNL:[id number], CYNTHIA, DO  Subjective: Abdominal pain essentially unchanged.  Assessment/Plan: Principal Problem: Colitis: Etiology either infectious or ischemic colitis. Keep nothing by mouth, continue Cipro/Flagyl. Abdomen is soft with some mild tenderness in the right upper mid abdomen-without any peritoneal signs. Will continue with supportive measures, and consult gastroenterology if symptoms persist  Active Problems: Essential hypertension: Controlled with amlodipine and benazepril  Hypokalemia: Replete and recheck in a.m.  Parkinson's disease: Continue with Sinemet-appears stable  Anxiety: Continue with Xanax and sertraline  Dyslipidemia: Continue statin  Bronchial asthma: Lungs clear-appears stable-continue as needed bronchodilators  Diabetes mellitus type 2, controlled: CBGs stable, continue Lantus 75 units (half of home dose) as nothing by mouth. Follow  GERD: Continue PPI  Disposition: Remain inpatient  Antimicrobial agents  See below  Anti-infectives    Start     Dose/Rate Route Frequency Ordered Stop   03/03/15 1000  ciprofloxacin (CIPRO) IVPB 400 mg     400 mg 200 mL/hr over 60 Minutes Intravenous Every 12 hours 03/03/15 0017     03/03/15 0800  metroNIDAZOLE (FLAGYL) IVPB 500 mg     500 mg 100 mL/hr over 60 Minutes Intravenous Every 8 hours 03/03/15 0009     03/02/15 2200  ciprofloxacin (CIPRO) IVPB 400 mg     400 mg 200 mL/hr over 60 Minutes Intravenous  Once 03/02/15 2146 03/02/15 2259   03/02/15 2200  metroNIDAZOLE (FLAGYL) IVPB 500 mg     500 mg 100 mL/hr over 60 Minutes Intravenous  Once 03/02/15 2146 03/03/15 0028      DVT Prophylaxis: Prophylactic Lovenox   Code Status:  DNR  Family Communication None at bedside  Procedures: None  CONSULTS:  None  Time  spent 30 minutes-Greater than 50% of this time was spent in counseling, explanation of diagnosis, planning of further management, and coordination of care.  MEDICATIONS: Scheduled Meds: . ALPRAZolam  1 mg Oral TID  . amLODipine  10 mg Oral Daily  . benazepril  20 mg Oral Daily  . carbidopa-levodopa  1 tablet Oral Daily  . ciprofloxacin  400 mg Intravenous Q12H  . enoxaparin (LOVENOX) injection  40 mg Subcutaneous Q24H  . insulin aspart  0-9 Units Subcutaneous TID WC  . insulin glargine  100 Units Subcutaneous QHS  . metronidazole  500 mg Intravenous Q8H  . mometasone-formoterol  2 puff Inhalation BID  . pantoprazole  40 mg Oral Daily  . sertraline  100 mg Oral Daily  . simvastatin  20 mg Oral QHS   Continuous Infusions: . sodium chloride Stopped (03/03/15 0045)  . sodium chloride 125 mL/hr at 03/03/15 0043   PRN Meds:.acetaminophen **OR** acetaminophen, albuterol, morphine injection, ondansetron **OR** ondansetron (ZOFRAN) IV    PHYSICAL EXAM: Vital signs in last 24 hours: Filed Vitals:   03/02/15 2249 03/03/15 0557 03/03/15 0652 03/03/15 0700  BP: 134/68 94/38 114/50   Pulse: 75 69    Temp: 98.4 F (36.9 C) 97.9 F (36.6 C)    TempSrc: Oral Oral    Resp: 18 20    Height: '5\' 4"'$  (1.626 m)     Weight: 120.87 kg (266 lb 7.5 oz)     SpO2: 95% 95%  95%    Weight change:  Filed Weights   03/02/15 1614 03/02/15 2249  Weight: 116.121  kg (256 lb) 120.87 kg (266 lb 7.5 oz)   Body mass index is 45.72 kg/(m^2).   Gen Exam: Awake and alert with clear speech.   Neck: Supple, No JVD.  Chest: B/L Clear.   CVS: S1 S2 Regular, no murmurs.  Abdomen: soft, BS +, non tender, non distended.  Extremities: no edema, lower extremities warm to touch Neurologic: Non Focal.   Skin: No Rash.   Wounds: N/A.    Intake/Output from previous day:  Intake/Output Summary (Last 24 hours) at 03/03/15 1239 Last data filed at 03/03/15 0900  Gross per 24 hour  Intake 2909.58 ml  Output       0 ml  Net 2909.58 ml     LAB RESULTS: CBC  Recent Labs Lab 03/02/15 1619 03/03/15 0115  WBC 9.4 7.6  HGB 13.0 11.9*  HCT 39.3 36.5  PLT 206 174  MCV 86.0 86.7  MCH 28.4 28.3  MCHC 33.1 32.6  RDW 14.1 14.2  LYMPHSABS 2.1 2.2  MONOABS 0.4 0.4  EOSABS 0.1 0.2  BASOSABS 0.0 0.0    Chemistries   Recent Labs Lab 03/02/15 1619 03/03/15 0115  NA 144 142  K 4.6 3.3*  CL 107 108  CO2 27 26  GLUCOSE 140* 111*  BUN 10 8  CREATININE 0.72 0.67  CALCIUM 9.4 8.7*    CBG:  Recent Labs Lab 03/02/15 2259 03/03/15 0756 03/03/15 1226  GLUCAP 97 80 133*    GFR Estimated Creatinine Clearance: 87.5 mL/min (by C-G formula based on Cr of 0.67).  Coagulation profile No results for input(s): INR, PROTIME in the last 168 hours.  Cardiac Enzymes No results for input(s): CKMB, TROPONINI, MYOGLOBIN in the last 168 hours.  Invalid input(s): CK  Invalid input(s): POCBNP No results for input(s): DDIMER in the last 72 hours. No results for input(s): HGBA1C in the last 72 hours. No results for input(s): CHOL, HDL, LDLCALC, TRIG, CHOLHDL, LDLDIRECT in the last 72 hours. No results for input(s): TSH, T4TOTAL, T3FREE, THYROIDAB in the last 72 hours.  Invalid input(s): FREET3 No results for input(s): VITAMINB12, FOLATE, FERRITIN, TIBC, IRON, RETICCTPCT in the last 72 hours.  Recent Labs  03/02/15 1619  LIPASE 31    Urine Studies No results for input(s): UHGB, CRYS in the last 72 hours.  Invalid input(s): UACOL, UAPR, USPG, UPH, UTP, UGL, UKET, UBIL, UNIT, UROB, ULEU, UEPI, UWBC, URBC, UBAC, CAST, UCOM, BILUA  MICROBIOLOGY: No results found for this or any previous visit (from the past 240 hour(s)).  RADIOLOGY STUDIES/RESULTS: Ct Abdomen Pelvis W Contrast  03/02/2015  CLINICAL DATA:  Abdominal pain since last night. EXAM: CT ABDOMEN AND PELVIS WITH CONTRAST TECHNIQUE: Multidetector CT imaging of the abdomen and pelvis was performed using the standard protocol following  bolus administration of intravenous contrast. CONTRAST:  137m OMNIPAQUE IOHEXOL 300 MG/ML  SOLN COMPARISON:  CT 04/13/2011 FINDINGS: Lower chest: The included lung bases are clear. Tiny subpleural 3 mm nodule in the right lower lobe. Liver: Decreased density consistent with steatosis. No focal lesion. Hepatobiliary: Postcholecystectomy with clips in the gallbladder fossa. Unchanged biliary prominence, sequela of cholecystectomy. Pancreas: Normal. Spleen: Normal. Adrenal glands: No nodule. Kidneys: Symmetric renal enhancement and excretion. No hydronephrosis. Stomach/Bowel: Stomach is decompressed. There are no dilated or thickened small bowel loops. Moderate segment of colonic wall thickening with adjacent pericolonic inflammatory change at the hepatic flexure of the colon. Scattered diverticular throughout the distal colon. No diverticulitis. Enteric chain sutures noted in the sigmoid colon. The appendix is surgically  absent. Vascular/Lymphatic: No retroperitoneal adenopathy. Abdominal aorta is normal in caliber. Moderate atherosclerosis. Reproductive: Uterus is surgically absent. Left ovary quiescent, right ovary not definitively seen. No adnexal mass. Bladder: Minimally distended, not well evaluated. Other: No free air, free fluid, or intra-abdominal fluid collection. Fat containing umbilical hernia, no bowel involvement. Small fat containing supraumbilical ventral abdominal wall hernia, which contains small focus of transverse colon, no wall thickening at this site. Musculoskeletal: There are no acute or suspicious osseous abnormalities. Multilevel facet arthropathy in the lower lumbar spine. Scattered degenerative disc disease. IMPRESSION: 1. Colitis involving the hepatic flexure of the colon, this may be infectious, inflammatory, or ischemic given location. 2. Small Richter hernia of the supraumbilical abdominal wall without bowel compromise. 3. Incidental findings of diverticulosis and hepatic steatosis.  Electronically Signed   By: Jeb Levering M.D.   On: 03/02/2015 21:09    Oren Binet, MD  [facility] Pager:[phone]  If 7PM-7AM, please contact night-coverage [email or web address] Password [email or web address] 03/03/2015, 12:39 PM   LOS: 0 days

## 2015-03-03 NOTE — H&P (Signed)
[facility] History and Physical  [name] JHE:[id number] DOB: May 18, 1945 DOA: 03/02/2015  Referring physician: Dr. Tomi Bamberger. PCP: Octavio Graves, DO  Specialists: None.  Chief Complaint: Abdominal pain.  HPI: [name] is a 69 y.o. female with previous history of diverticulitis requiring hemicolectomy and colostomy bag which was reversed presents to the ER because of abdominal pain which is gradually worsening over the last 24 hours. Pain is mostly in the right side of the abdomen. Has had some nausea but denies any vomiting or diarrhea. Denies any fever or chills. Denies use of any recent antibiotics. In the ER CT abdomen and pelvis shows colitis involving the hepatic flexure with Darron Doom with no bowels in it. Patient's pain improved with pain medication. Patient has been admitted for further management. Patient states she was not able to eat the whole day because of the pain.   Review of Systems: As presented in the history of presenting illness, rest negative.  Past Medical History  Diagnosis Date  . Diabetes mellitus   . Hypertension   . Parkinson disease ([place]) 2010  . Bleeding nose    Past Surgical History  Procedure Laterality Date  . Colon surgery      colectomy and reversal after diverticular ds  . Cholecystectomy    . Appendectomy    . Total knee arthroplasty      right  . Tonsillectomy     Social History:  reports that she quit smoking about 27 years ago. She has never used smokeless tobacco. She reports that she does not drink alcohol or use illicit drugs. Where does patient live home. Can patient participate in ADLs? Yes.  Allergies  Allergen Reactions  . Morphine And Related Nausea And Vomiting and Other (See Comments)    headache    Family History:  Family History  Problem Relation Age of Onset  . Diabetes Mellitus II Sister   . Colon cancer Neg Hx   . Diabetes Mellitus II Son       Prior to Admission medications   Medication  Sig Start Date End Date Taking? Authorizing Provider  ALPRAZolam Duanne Moron) 1 MG tablet Take 1 mg by mouth 3 (three) times daily.     Yes Historical Provider, MD  amLODipine (NORVASC) 10 MG tablet Take 10 mg by mouth daily.   Yes Historical Provider, MD  benazepril (LOTENSIN) 20 MG tablet Take 20 mg by mouth daily.     Yes Historical Provider, MD  carbidopa-levodopa (SINEMET CR) 50-200 MG per tablet Take 1 tablet by mouth daily.     Yes Historical Provider, MD  DEXILANT 60 MG capsule Take 1 capsule by mouth daily as needed. For acid reflux 06/24/13  Yes Historical Provider, MD  Fluticasone-Salmeterol (ADVAIR) 500-50 MCG/DOSE AEPB Inhale 1 puff into the lungs every 12 (twelve) hours.     Yes Historical Provider, MD  insulin glargine (LANTUS) 100 UNIT/ML injection Inject 150 Units into the skin at bedtime.    Yes Historical Provider, MD  oxyCODONE-acetaminophen (PERCOCET) 10-325 MG per tablet Take 1 tablet by mouth every 6 (six) hours as needed for pain.  10/03/12  Yes Historical Provider, MD  PROAIR HFA 108 (90 BASE) MCG/ACT inhaler Inhale 1 puff into the lungs every 4 (four) hours as needed. For shortness of breath 06/24/13  Yes Historical Provider, MD  sertraline (ZOLOFT) 100 MG tablet Take 100 mg by mouth daily. 06/29/13  Yes Historical Provider, MD  simvastatin (ZOCOR) 20 MG tablet Take 20 mg by mouth at  bedtime.     Yes Historical Provider, MD    Physical Exam: Filed Vitals:   03/02/15 2145 03/02/15 2200 03/02/15 2215 03/02/15 2249  BP: 125/54 130/56 124/53 134/68  Pulse: 74 79 70 75  Temp:    98.4 F (36.9 C)  TempSrc:    Oral  Resp:    18  Height:    '5\' 4"'$  (1.626 m)  Weight:    120.87 kg (266 lb 7.5 oz)  SpO2: 96% 95% 94% 95%     General:  Moderately built and nourished.  Eyes: Anicteric no pallor.  ENT: No discharge from the ears eyes nose and mouth.  Neck: No mass felt.  Cardiovascular: S1 and S2 heard.  Respiratory: No rhonchi or crepitations.  Abdomen: Soft nontender bowel  sounds present. No guarding or rigidity.  Skin: No rash.  Musculoskeletal: No edema.  Psychiatric: Appears normal.  Neurologic: Alert awake oriented to time place and person. Moves all extremities.  Labs on Admission:  Basic Metabolic Panel:  Recent Labs Lab 03/02/15 1619  NA 144  K 4.6  CL 107  CO2 27  GLUCOSE 140*  BUN 10  CREATININE 0.72  CALCIUM 9.4   Liver Function Tests:  Recent Labs Lab 03/02/15 1619  AST 20  ALT 16  ALKPHOS 75  BILITOT 0.4  PROT 6.9  ALBUMIN 3.5    Recent Labs Lab 03/02/15 1619  LIPASE 31   No results for input(s): AMMONIA in the last 168 hours. CBC:  Recent Labs Lab 03/02/15 1619  WBC 9.4  NEUTROABS 6.7  HGB 13.0  HCT 39.3  MCV 86.0  PLT 206   Cardiac Enzymes: No results for input(s): CKTOTAL, CKMB, CKMBINDEX, TROPONINI in the last 168 hours.  BNP (last 3 results) No results for input(s): BNP in the last 8760 hours.  ProBNP (last 3 results) No results for input(s): PROBNP in the last 8760 hours.  CBG:  Recent Labs Lab 03/02/15 2259  GLUCAP 97    Radiological Exams on Admission: Ct Abdomen Pelvis W Contrast  03/02/2015  CLINICAL DATA:  Abdominal pain since last night. EXAM: CT ABDOMEN AND PELVIS WITH CONTRAST TECHNIQUE: Multidetector CT imaging of the abdomen and pelvis was performed using the standard protocol following bolus administration of intravenous contrast. CONTRAST:  161m OMNIPAQUE IOHEXOL 300 MG/ML  SOLN COMPARISON:  CT 04/13/2011 FINDINGS: Lower chest: The included lung bases are clear. Tiny subpleural 3 mm nodule in the right lower lobe. Liver: Decreased density consistent with steatosis. No focal lesion. Hepatobiliary: Postcholecystectomy with clips in the gallbladder fossa. Unchanged biliary prominence, sequela of cholecystectomy. Pancreas: Normal. Spleen: Normal. Adrenal glands: No nodule. Kidneys: Symmetric renal enhancement and excretion. No hydronephrosis. Stomach/Bowel: Stomach is decompressed.  There are no dilated or thickened small bowel loops. Moderate segment of colonic wall thickening with adjacent pericolonic inflammatory change at the hepatic flexure of the colon. Scattered diverticular throughout the distal colon. No diverticulitis. Enteric chain sutures noted in the sigmoid colon. The appendix is surgically absent. Vascular/Lymphatic: No retroperitoneal adenopathy. Abdominal aorta is normal in caliber. Moderate atherosclerosis. Reproductive: Uterus is surgically absent. Left ovary quiescent, right ovary not definitively seen. No adnexal mass. Bladder: Minimally distended, not well evaluated. Other: No free air, free fluid, or intra-abdominal fluid collection. Fat containing umbilical hernia, no bowel involvement. Small fat containing supraumbilical ventral abdominal wall hernia, which contains small focus of transverse colon, no wall thickening at this site. Musculoskeletal: There are no acute or suspicious osseous abnormalities. Multilevel facet arthropathy in the lower  lumbar spine. Scattered degenerative disc disease. IMPRESSION: 1. Colitis involving the hepatic flexure of the colon, this may be infectious, inflammatory, or ischemic given location. 2. Small Richter hernia of the supraumbilical abdominal wall without bowel compromise. 3. Incidental findings of diverticulosis and hepatic steatosis. Electronically Signed   By: Jeb Levering M.D.   On: 03/02/2015 21:09      Assessment/Plan Principal Problem:   Colitis Active Problems:   Essential hypertension   Asthma   Diabetes mellitus type 2, controlled ([place])   Hyperlipidemia   1. Colitis - differentials include ischemic versus inflammatory versus infectious. Patient does not have any diarrhea or is afebrile. At this time I have placed patient on Cipro and Flagyl. Gentle hydration. Check lactic acid levels. Patient states she has had last coloscopy more than 6 years ago. If pain persists and may need gastroenterology consult.  Continue with hydration and pain related medications. For now I have placed patient on full liquid diet. 2. Diabetes mellitus type 2 - patient is on large doses of Lantus. Since patient is only on full liquid diet I have decreased patient's Lantus dose. If patient is able to tolerate regular diet then may have increased back to her home dose. Sliding scale coverage. 3. Asthma - presently not wheezing. 4. OSA on CPAP. 5. Hypertension - continue present medications.   DVT Prophylaxis Lovenox.  Code Status: DO NOT RESUSCITATE.  Family Communication: Patient's daughter at bedside.  Disposition Plan: Admit to inpatient.    Jaidalyn Schillo N. [facility] Pager [phone].  If 7PM-7AM, please contact night-coverage [email or web address] Password [email or web address] 03/03/2015, 12:14 AM

## 2015-03-03 NOTE — Progress Notes (Signed)
PT Cancellation Note  Patient Details Name: [name] MRN: [id number] DOB: [birth year]   Cancelled Treatment:    Reason Eval/Treat Not Completed: Patient declined, no reason specified Pt politely declined working with therapy this PM due to wanting to rest. Will follow up next available time.    Marguarite Arbour A Mi Balla 03/03/2015, 3:32 PM  Wray Kearns, [place], DPT [phone]

## 2015-03-03 NOTE — Progress Notes (Signed)
Hypoglycemic Event  CBG: 63  Treatment: D50 IV 25 mL  Symptoms: None  Follow-up CBG: Time:1732 CBG Result:113  Possible Reasons for Event: Inadequate meal intake, Pt. NPO  Comments/MD notified:Dr. Ghimire called & orders given to change IVF & D/C bedtime insulin    Wynetta Emery, Bernese Doffing C

## 2015-03-04 DIAGNOSIS — J45909 Unspecified asthma, uncomplicated: Secondary | ICD-10-CM

## 2015-03-04 DIAGNOSIS — I10 Essential (primary) hypertension: Secondary | ICD-10-CM

## 2015-03-04 LAB — BASIC METABOLIC PANEL
ANION GAP: 9 (ref 5–15)
CHLORIDE: 106 mmol/L (ref 101–111)
CO2: 24 mmol/L (ref 22–32)
Calcium: 8.9 mg/dL (ref 8.9–10.3)
Creatinine, Ser: 0.65 mg/dL (ref 0.44–1.00)
GFR calc Af Amer: >60 mL/min (ref >60)
GFR calc non Af Amer: >60 mL/min (ref >60)
Glucose, Bld: 79 mg/dL (ref 65–99)
POTASSIUM: 3.9 mmol/L (ref 3.5–5.1)
SODIUM: 139 mmol/L (ref 135–145)

## 2015-03-04 LAB — CBC
HEMATOCRIT: 39.6 % (ref 36.0–46.0)
HEMOGLOBIN: 13 g/dL (ref 12.0–15.0)
MCH: 28.7 pg (ref 26.0–34.0)
MCHC: 32.8 g/dL (ref 30.0–36.0)
MCV: 87.4 fL (ref 78.0–100.0)
Platelets: 212 10*3/uL (ref 150–400)
RBC: 4.53 MIL/uL (ref 3.87–5.11)
RDW: 14.4 % (ref 11.5–15.5)
WBC: 8.7 10*3/uL (ref 4.0–10.5)

## 2015-03-04 LAB — MAGNESIUM: Magnesium: 2.2 mg/dL (ref 1.7–2.4)

## 2015-03-04 LAB — GLUCOSE, CAPILLARY
GLUCOSE-CAPILLARY: 201 mg/dL — AB (ref 65–99)
GLUCOSE-CAPILLARY: 92 mg/dL (ref 65–99)
GLUCOSE-CAPILLARY: 228 mg/dL — AB (ref 65–99)
Glucose-Capillary: 88 mg/dL (ref 65–99)

## 2015-03-04 MED ORDER — INSULIN GLARGINE 100 UNIT/ML ~~LOC~~ SOLN
40.0000 [IU] | Freq: Every day | SUBCUTANEOUS | Status: DC
  Administered 2015-03-04 – 2015-03-06 (×3): 40 [IU] via SUBCUTANEOUS
  Filled 2015-03-04 (×4): qty 0.4

## 2015-03-04 MED ORDER — ENOXAPARIN SODIUM 60 MG/0.6ML ~~LOC~~ SOLN
60.0000 mg | SUBCUTANEOUS | Status: DC
Start: 2015-03-05 — End: 2015-03-07
  Administered 2015-03-05 – 2015-03-07 (×3): 60 mg via SUBCUTANEOUS
  Filled 2015-03-04 (×3): qty 0.6

## 2015-03-04 NOTE — Evaluation (Signed)
Physical Therapy Evaluation Patient Details Name: SAIMA MONTERROSO MRN: [id number] DOB: 1945/12/17 Today's Date: 03/04/2015   History of Present Illness  Pt adm with colitis. PMH - Parkinson's, DM  Clinical Impression  Pt doing well with mobility and no further PT needed.  Ready for dc from PT standpoint.      Follow Up Recommendations No PT follow up    Equipment Recommendations  None recommended by PT    Recommendations for Other Services       Precautions / Restrictions Precautions Precautions: None Restrictions Weight Bearing Restrictions: No      Mobility  Bed Mobility               General bed mobility comments: Pt up on EOB.  Transfers Overall transfer level: Independent                  Ambulation/Gait Ambulation/Gait assistance: Independent Ambulation Distance (Feet): 500 Feet Assistive device: None Gait Pattern/deviations: WFL(Within Functional Limits)   Gait velocity interpretation: at or above normal speed for age/gender General Gait Details: Steady gait  Stairs            Wheelchair Mobility    Modified Rankin (Stroke Patients Only)       Balance Overall balance assessment: No apparent balance deficits (not formally assessed);Independent                                           Pertinent Vitals/Pain Pain Assessment: No/denies pain    Home Living Family/patient expects to be discharged to:: Private residence Living Arrangements: Children                    Prior Function Level of Independence: Independent               Hand Dominance        Extremity/Trunk Assessment   Upper Extremity Assessment: Overall WFL for tasks assessed           Lower Extremity Assessment: Overall WFL for tasks assessed         Communication   Communication: No difficulties  Cognition Arousal/Alertness: Awake/alert Behavior During Therapy: WFL for tasks assessed/performed Overall  Cognitive Status: Within Functional Limits for tasks assessed                      General Comments      Exercises        Assessment/Plan    PT Assessment Patent does not need any further PT services  PT Diagnosis Difficulty walking   PT Problem List    PT Treatment Interventions     PT Goals (Current goals can be found in the Care Plan section) Acute Rehab PT Goals PT Goal Formulation: All assessment and education complete, DC therapy    Frequency     Barriers to discharge        Co-evaluation               End of Session   Activity Tolerance: Patient tolerated treatment well Patient left: in bed;with call bell/phone within reach           Time: 1008-1016 PT Time Calculation (min) (ACUTE ONLY): 8 min   Charges:   PT Evaluation $Initial PT Evaluation Tier I: 1 Procedure     PT G Codes:        Chistina Roston  03/04/2015, 10:52 AM Suanne Marker PT [phone]

## 2015-03-04 NOTE — Progress Notes (Signed)
PATIENT DETAILS Name: [name] Age: 69 y.o. Sex: female Date of Birth: March 10, 1946 Admit Date: 03/02/2015 Admitting Physician Rise Patience, MD BJS:[id number], CYNTHIA, DO  Subjective: Abdominal pain has resolved.  Assessment/Plan: Principal Problem: Colitis: Etiology either infectious or ischemic colitis.Initially kept NPO-since pain has resolved-advance diet today. Abdomen is soft without any tenderness. Continue empiric Cipro and Flagyl. Suspected clinical improvement continues-home on 11/11.   Active Problems: Essential hypertension: Controlled with amlodipine and benazepril  Hypokalemia: Repleted, recheck periodically  Parkinson's disease: Continue with Sinemet-appears stable  Anxiety: Continue with Xanax and sertraline  Dyslipidemia: Continue statin  Bronchial asthma: Lungs clear-appears stable-continue as needed bronchodilators  Diabetes mellitus type 2, controlled: CBGs stable, Lantus yesterday as CBGs were low -since diet has been started-resume Lantus 40 units along with SSI. As diet is advanced-Will slowly placed back on usual dose  GERD: Continue PPI  Disposition: Remain inpatient-home 11/11 if clinical improvement continues  Antimicrobial agents  See below  Anti-infectives    Start     Dose/Rate Route Frequency Ordered Stop   03/03/15 1000  ciprofloxacin (CIPRO) IVPB 400 mg     400 mg 200 mL/hr over 60 Minutes Intravenous Every 12 hours 03/03/15 0017     03/03/15 0800  metroNIDAZOLE (FLAGYL) IVPB 500 mg     500 mg 100 mL/hr over 60 Minutes Intravenous Every 8 hours 03/03/15 0009     03/02/15 2200  ciprofloxacin (CIPRO) IVPB 400 mg     400 mg 200 mL/hr over 60 Minutes Intravenous  Once 03/02/15 2146 03/02/15 2259   03/02/15 2200  metroNIDAZOLE (FLAGYL) IVPB 500 mg     500 mg 100 mL/hr over 60 Minutes Intravenous  Once 03/02/15 2146 03/03/15 0028      DVT Prophylaxis: Prophylactic Lovenox   Code Status:   DNR  Family Communication None at bedside  Procedures: None  CONSULTS:  None  Time spent 25 minutes-Greater than 50% of this time was spent in counseling, explanation of diagnosis, planning of further management, and coordination of care.  MEDICATIONS: Scheduled Meds: . ALPRAZolam  1 mg Oral TID  . amLODipine  10 mg Oral Daily  . benazepril  20 mg Oral Daily  . carbidopa-levodopa  1 tablet Oral Daily  . ciprofloxacin  400 mg Intravenous Q12H  . enoxaparin (LOVENOX) injection  40 mg Subcutaneous Q24H  . insulin aspart  0-9 Units Subcutaneous TID WC  . metronidazole  500 mg Intravenous Q8H  . mometasone-formoterol  2 puff Inhalation BID  . pantoprazole  40 mg Oral Daily  . sertraline  100 mg Oral Daily  . simvastatin  20 mg Oral QHS   Continuous Infusions:   PRN Meds:.acetaminophen **OR** acetaminophen, albuterol, morphine injection, ondansetron **OR** ondansetron (ZOFRAN) IV    PHYSICAL EXAM: Vital signs in last 24 hours: Filed Vitals:   03/03/15 0700 03/03/15 2122 03/04/15 0600 03/04/15 0921  BP:  102/38 131/54   Pulse:  71 87 86  Temp:  98.7 F (37.1 C) 98.7 F (37.1 C)   TempSrc:  Oral Oral   Resp:  '16 18 18  '$ Height:      Weight:      SpO2: 95% 95% 95% 95%    Weight change:  Filed Weights   03/02/15 1614 03/02/15 2249  Weight: 116.121 kg (256 lb) 120.87 kg (266 lb 7.5 oz)   Body mass index is 45.72 kg/(m^2).   Gen Exam: Awake and  alert with clear speech.   Neck: Supple, No JVD.  Chest: B/L Clear.   CVS: S1 S2 Regular, no murmurs.  Abdomen: soft, BS +, non tender, non distended.  Extremities: no edema, lower extremities warm to touch Neurologic: Non Focal.   Skin: No Rash.   Wounds: N/A.    Intake/Output from previous day:  Intake/Output Summary (Last 24 hours) at 03/04/15 1003 Last data filed at 03/04/15 0900  Gross per 24 hour  Intake 3001.17 ml  Output      0 ml  Net 3001.17 ml     LAB RESULTS: CBC  Recent Labs Lab 03/02/15 1619  03/03/15 0115 03/04/15 0556  WBC 9.4 7.6 8.7  HGB 13.0 11.9* 13.0  HCT 39.3 36.5 39.6  PLT 206 174 212  MCV 86.0 86.7 87.4  MCH 28.4 28.3 28.7  MCHC 33.1 32.6 32.8  RDW 14.1 14.2 14.4  LYMPHSABS 2.1 2.2  --   MONOABS 0.4 0.4  --   EOSABS 0.1 0.2  --   BASOSABS 0.0 0.0  --     Chemistries   Recent Labs Lab 03/02/15 1619 03/03/15 0115 03/04/15 0556  NA 144 142 139  K 4.6 3.3* 3.9  CL 107 108 106  CO2 '27 26 24  '$ GLUCOSE 140* 111* 79  BUN 10 8 <5*  CREATININE 0.72 0.67 0.65  CALCIUM 9.4 8.7* 8.9  MG  --   --  2.2    CBG:  Recent Labs Lab 03/03/15 1650 03/03/15 1732 03/03/15 2119 03/03/15 2207 03/04/15 0815  GLUCAP 63* 113* 69 130* 88    GFR Estimated Creatinine Clearance: 87.5 mL/min (by C-G formula based on Cr of 0.65).  Coagulation profile No results for input(s): INR, PROTIME in the last 168 hours.  Cardiac Enzymes No results for input(s): CKMB, TROPONINI, MYOGLOBIN in the last 168 hours.  Invalid input(s): CK  Invalid input(s): POCBNP No results for input(s): DDIMER in the last 72 hours. No results for input(s): HGBA1C in the last 72 hours. No results for input(s): CHOL, HDL, LDLCALC, TRIG, CHOLHDL, LDLDIRECT in the last 72 hours. No results for input(s): TSH, T4TOTAL, T3FREE, THYROIDAB in the last 72 hours.  Invalid input(s): FREET3 No results for input(s): VITAMINB12, FOLATE, FERRITIN, TIBC, IRON, RETICCTPCT in the last 72 hours.  Recent Labs  03/02/15 1619  LIPASE 31    Urine Studies No results for input(s): UHGB, CRYS in the last 72 hours.  Invalid input(s): UACOL, UAPR, USPG, UPH, UTP, UGL, UKET, UBIL, UNIT, UROB, ULEU, UEPI, UWBC, URBC, UBAC, CAST, UCOM, BILUA  MICROBIOLOGY: No results found for this or any previous visit (from the past 240 hour(s)).  RADIOLOGY STUDIES/RESULTS: Ct Abdomen Pelvis W Contrast  03/02/2015  CLINICAL DATA:  Abdominal pain since last night. EXAM: CT ABDOMEN AND PELVIS WITH CONTRAST TECHNIQUE:  Multidetector CT imaging of the abdomen and pelvis was performed using the standard protocol following bolus administration of intravenous contrast. CONTRAST:  166m OMNIPAQUE IOHEXOL 300 MG/ML  SOLN COMPARISON:  CT 04/13/2011 FINDINGS: Lower chest: The included lung bases are clear. Tiny subpleural 3 mm nodule in the right lower lobe. Liver: Decreased density consistent with steatosis. No focal lesion. Hepatobiliary: Postcholecystectomy with clips in the gallbladder fossa. Unchanged biliary prominence, sequela of cholecystectomy. Pancreas: Normal. Spleen: Normal. Adrenal glands: No nodule. Kidneys: Symmetric renal enhancement and excretion. No hydronephrosis. Stomach/Bowel: Stomach is decompressed. There are no dilated or thickened small bowel loops. Moderate segment of colonic wall thickening with adjacent pericolonic inflammatory change at the hepatic  flexure of the colon. Scattered diverticular throughout the distal colon. No diverticulitis. Enteric chain sutures noted in the sigmoid colon. The appendix is surgically absent. Vascular/Lymphatic: No retroperitoneal adenopathy. Abdominal aorta is normal in caliber. Moderate atherosclerosis. Reproductive: Uterus is surgically absent. Left ovary quiescent, right ovary not definitively seen. No adnexal mass. Bladder: Minimally distended, not well evaluated. Other: No free air, free fluid, or intra-abdominal fluid collection. Fat containing umbilical hernia, no bowel involvement. Small fat containing supraumbilical ventral abdominal wall hernia, which contains small focus of transverse colon, no wall thickening at this site. Musculoskeletal: There are no acute or suspicious osseous abnormalities. Multilevel facet arthropathy in the lower lumbar spine. Scattered degenerative disc disease. IMPRESSION: 1. Colitis involving the hepatic flexure of the colon, this may be infectious, inflammatory, or ischemic given location. 2. Small Richter hernia of the supraumbilical  abdominal wall without bowel compromise. 3. Incidental findings of diverticulosis and hepatic steatosis. Electronically Signed   By: Jeb Levering M.D.   On: 03/02/2015 21:09    Oren Binet, MD  [facility] Pager:[phone]  If 7PM-7AM, please contact night-coverage [email or web address] Password [email or web address] 03/04/2015, 10:03 AM   LOS: 1 day

## 2015-03-05 LAB — GLUCOSE, CAPILLARY
GLUCOSE-CAPILLARY: 116 mg/dL — AB (ref 65–99)
Glucose-Capillary: 215 mg/dL — ABNORMAL HIGH (ref 65–99)
Glucose-Capillary: 146 mg/dL — ABNORMAL HIGH (ref 65–99)
Glucose-Capillary: 189 mg/dL — ABNORMAL HIGH (ref 65–99)

## 2015-03-05 MED ORDER — SODIUM CHLORIDE 0.9 % IV SOLN
INTRAVENOUS | Status: DC
  Administered 2015-03-05: 18:00:00 via INTRAVENOUS
  Administered 2015-03-06: 75 mL/h via INTRAVENOUS

## 2015-03-05 NOTE — Progress Notes (Signed)
CPAP is bedside and ready for use.  Patient said she will place on herself when ready for bed.  Patient is aware to ask RN to call Respiratory if she needs further assistance.

## 2015-03-05 NOTE — Progress Notes (Signed)
Visit to patients room to help with Cpap.  Pt states she has been wearing a Cpap for 20 years and will turn it on and self administer when she is ready for bed. Advised pt if she has any issues to have RN call RT.

## 2015-03-05 NOTE — Care Management Important Message (Signed)
Important Message  Patient Details  Name: [name] MRN: [id number] Date of Birth: November 24, 1945   Medicare Important Message Given:  Yes    Barb Merino Janayah Zavada 03/05/2015, 2:34 PM

## 2015-03-05 NOTE — Progress Notes (Signed)
PATIENT DETAILS Name: [name] Age: 69 y.o. Sex: female Date of Birth: [birth year] Admit Date: 03/02/2015 Admitting Physician Rise Patience, MD YWV:[id number], [place], DO  Brief narrative: 69 year old female with history of essential hypertension, Parkinson's disease, anxiety and dyslipidemia presented with abdominal pain. CT of the abdomen showed colitis involving the hepatic flexure. Patient was admitted, started on IV antibiotics and provided other supportive measures. Although improved, she continues to have intermittent abdominal pain. See below for further details  Subjective: Although better than on initial presentation-started to have abdominal pain again this morning. No vomiting.  Assessment/Plan: Principal Problem: Colitis: Etiology either infectious or ischemic colitis.Initially kept NPO-diet slowly advanced to soft diet this morning-but pain seems to have reoccurred, will downgraded back to full liquids, continue IV fluids and IV Cipro/Flagyl. Abdomen is still soft, with mild tenderness in the right mid upper abdominal area-there are no peritoneal signs. Will reattempt to advance to soft diet in a.m.-suspect she will be stable for discharge in the next 1-2 days. Patient has been advised to follow-up with her gastroenterologist (Dr.Stark) in a few weeks for a elective outpatient Colonoscopy   Active Problems: Essential hypertension: Controlled with amlodipine and benazepril  Hypokalemia: Repleted, recheck periodically  Parkinson's disease: Continue with Sinemet-appears stable  Anxiety: Continue with Xanax and sertraline  Dyslipidemia: Continue statin  Bronchial asthma: Lungs clear-appears stable-continue as needed bronchodilators  Diabetes mellitus type 2, controlled: CBGs stable, since on full liquids-continue with Lantus 40 units along with SSI.  GERD: Continue PPI  Disposition: Remain inpatient-home 1-2 days if clinical  improved  Antimicrobial agents  See below  Anti-infectives    Start     Dose/Rate Route Frequency Ordered Stop   03/03/15 1000  ciprofloxacin (CIPRO) IVPB 400 mg     400 mg 200 mL/hr over 60 Minutes Intravenous Every 12 hours 03/03/15 0017     03/03/15 0800  metroNIDAZOLE (FLAGYL) IVPB 500 mg     500 mg 100 mL/hr over 60 Minutes Intravenous Every 8 hours 03/03/15 0009     03/02/15 2200  ciprofloxacin (CIPRO) IVPB 400 mg     400 mg 200 mL/hr over 60 Minutes Intravenous  Once 03/02/15 2146 03/02/15 2259   03/02/15 2200  metroNIDAZOLE (FLAGYL) IVPB 500 mg     500 mg 100 mL/hr over 60 Minutes Intravenous  Once 03/02/15 2146 03/03/15 0028      DVT Prophylaxis: Prophylactic Lovenox   Code Status:  DNR  Family Communication None at bedside  Procedures: None  CONSULTS:  None  Time spent 25 minutes-Greater than 50% of this time was spent in counseling, explanation of diagnosis, planning of further management, and coordination of care.  MEDICATIONS: Scheduled Meds: . ALPRAZolam  1 mg Oral TID  . amLODipine  10 mg Oral Daily  . benazepril  20 mg Oral Daily  . carbidopa-levodopa  1 tablet Oral Daily  . ciprofloxacin  400 mg Intravenous Q12H  . enoxaparin (LOVENOX) injection  60 mg Subcutaneous Q24H  . insulin aspart  0-9 Units Subcutaneous TID WC  . insulin glargine  40 Units Subcutaneous QHS  . metronidazole  500 mg Intravenous Q8H  . mometasone-formoterol  2 puff Inhalation BID  . pantoprazole  40 mg Oral Daily  . sertraline  100 mg Oral Daily  . simvastatin  20 mg Oral QHS   Continuous Infusions:   PRN Meds:.acetaminophen **OR** acetaminophen, albuterol, morphine injection, ondansetron **OR** ondansetron (ZOFRAN)  IV    PHYSICAL EXAM: Vital signs in last 24 hours: Filed Vitals:   03/04/15 2023 03/04/15 2227 03/05/15 0557 03/05/15 0932  BP:  107/57 104/56 105/37  Pulse:  66 71 66  Temp:  98.4 F (36.9 C) 97.6 F (36.4 C)   TempSrc:  Oral Oral   Resp:  21  16   Height:      Weight:      SpO2: 96% 96% 96%     Weight change:  Filed Weights   03/02/15 1614 03/02/15 2249  Weight: 116.121 kg (256 lb) 120.87 kg (266 lb 7.5 oz)   Body mass index is 45.72 kg/(m^2).   Gen Exam: Awake and alert with clear speech.   Neck: Supple, No JVD.  Chest: B/L Clear.   CVS: S1 S2 Regular, no murmurs.  Abdomen: soft, but mildly tender in the right mid upper abdominal area just lateral to the umbilicus. Bowel sounds present. Nondistended. No rebound or guarding.  Extremities: no edema, lower extremities warm to touch Neurologic: Non Focal.   Skin: No Rash.   Wounds: N/A.    Intake/Output from previous day:  Intake/Output Summary (Last 24 hours) at 03/05/15 1356 Last data filed at 03/05/15 0901  Gross per 24 hour  Intake    670 ml  Output      0 ml  Net    670 ml     LAB RESULTS: CBC  Recent Labs Lab 03/02/15 1619 03/03/15 0115 03/04/15 0556  WBC 9.4 7.6 8.7  HGB 13.0 11.9* 13.0  HCT 39.3 36.5 39.6  PLT 206 174 212  MCV 86.0 86.7 87.4  MCH 28.4 28.3 28.7  MCHC 33.1 32.6 32.8  RDW 14.1 14.2 14.4  LYMPHSABS 2.1 2.2  --   MONOABS 0.4 0.4  --   EOSABS 0.1 0.2  --   BASOSABS 0.0 0.0  --     Chemistries   Recent Labs Lab 03/02/15 1619 03/03/15 0115 03/04/15 0556  NA 144 142 139  K 4.6 3.3* 3.9  CL 107 108 106  CO2 '27 26 24  '$ GLUCOSE 140* 111* 79  BUN 10 8 <5*  CREATININE 0.72 0.67 0.65  CALCIUM 9.4 8.7* 8.9  MG  --   --  2.2    CBG:  Recent Labs Lab 03/04/15 1147 03/04/15 1638 03/04/15 2224 03/05/15 0812 03/05/15 1206  GLUCAP 201* 92 228* 116* 215*    GFR Estimated Creatinine Clearance: 87.5 mL/min (by C-G formula based on Cr of 0.65).  Coagulation profile No results for input(s): INR, PROTIME in the last 168 hours.  Cardiac Enzymes No results for input(s): CKMB, TROPONINI, MYOGLOBIN in the last 168 hours.  Invalid input(s): CK  Invalid input(s): POCBNP No results for input(s): DDIMER in the last 72  hours. No results for input(s): HGBA1C in the last 72 hours. No results for input(s): CHOL, HDL, LDLCALC, TRIG, CHOLHDL, LDLDIRECT in the last 72 hours. No results for input(s): TSH, T4TOTAL, T3FREE, THYROIDAB in the last 72 hours.  Invalid input(s): FREET3 No results for input(s): VITAMINB12, FOLATE, FERRITIN, TIBC, IRON, RETICCTPCT in the last 72 hours.  Recent Labs  03/02/15 1619  LIPASE 31    Urine Studies No results for input(s): UHGB, CRYS in the last 72 hours.  Invalid input(s): UACOL, UAPR, USPG, UPH, UTP, UGL, UKET, UBIL, UNIT, UROB, ULEU, UEPI, UWBC, URBC, UBAC, CAST, UCOM, BILUA  MICROBIOLOGY: No results found for this or any previous visit (from the past 240 hour(s)).  RADIOLOGY STUDIES/RESULTS: Ct  Abdomen Pelvis W Contrast  03/02/2015  CLINICAL DATA:  Abdominal pain since last night. EXAM: CT ABDOMEN AND PELVIS WITH CONTRAST TECHNIQUE: Multidetector CT imaging of the abdomen and pelvis was performed using the standard protocol following bolus administration of intravenous contrast. CONTRAST:  190m OMNIPAQUE IOHEXOL 300 MG/ML  SOLN COMPARISON:  CT 04/13/2011 FINDINGS: Lower chest: The included lung bases are clear. Tiny subpleural 3 mm nodule in the right lower lobe. Liver: Decreased density consistent with steatosis. No focal lesion. Hepatobiliary: Postcholecystectomy with clips in the gallbladder fossa. Unchanged biliary prominence, sequela of cholecystectomy. Pancreas: Normal. Spleen: Normal. Adrenal glands: No nodule. Kidneys: Symmetric renal enhancement and excretion. No hydronephrosis. Stomach/Bowel: Stomach is decompressed. There are no dilated or thickened small bowel loops. Moderate segment of colonic wall thickening with adjacent pericolonic inflammatory change at the hepatic flexure of the colon. Scattered diverticular throughout the distal colon. No diverticulitis. Enteric chain sutures noted in the sigmoid colon. The appendix is surgically absent. Vascular/Lymphatic:  No retroperitoneal adenopathy. Abdominal aorta is normal in caliber. Moderate atherosclerosis. Reproductive: Uterus is surgically absent. Left ovary quiescent, right ovary not definitively seen. No adnexal mass. Bladder: Minimally distended, not well evaluated. Other: No free air, free fluid, or intra-abdominal fluid collection. Fat containing umbilical hernia, no bowel involvement. Small fat containing supraumbilical ventral abdominal wall hernia, which contains small focus of transverse colon, no wall thickening at this site. Musculoskeletal: There are no acute or suspicious osseous abnormalities. Multilevel facet arthropathy in the lower lumbar spine. Scattered degenerative disc disease. IMPRESSION: 1. Colitis involving the hepatic flexure of the colon, this may be infectious, inflammatory, or ischemic given location. 2. Small Richter hernia of the supraumbilical abdominal wall without bowel compromise. 3. Incidental findings of diverticulosis and hepatic steatosis. Electronically Signed   By: MJeb LeveringM.D.   On: 03/02/2015 21:09    GOren Binet MD  [facility] Pager:[phone] If 7PM-7AM, please contact night-coverage [email or web address] Password [email or web address] 03/05/2015, 1:56 PM   LOS: 2 days

## 2015-03-06 DIAGNOSIS — K529 Noninfective gastroenteritis and colitis, unspecified: Principal | ICD-10-CM

## 2015-03-06 LAB — CBC
HCT: 35.9 % — ABNORMAL LOW (ref 36.0–46.0)
HEMOGLOBIN: 11.8 g/dL — AB (ref 12.0–15.0)
MCH: 28.6 pg (ref 26.0–34.0)
MCHC: 32.9 g/dL (ref 30.0–36.0)
MCV: 87.1 fL (ref 78.0–100.0)
Platelets: 167 10*3/uL (ref 150–400)
RBC: 4.12 MIL/uL (ref 3.87–5.11)
RDW: 13.9 % (ref 11.5–15.5)
WBC: 5.2 10*3/uL (ref 4.0–10.5)

## 2015-03-06 LAB — GLUCOSE, CAPILLARY
GLUCOSE-CAPILLARY: 107 mg/dL — AB (ref 65–99)
GLUCOSE-CAPILLARY: 116 mg/dL — AB (ref 65–99)
Glucose-Capillary: 188 mg/dL — ABNORMAL HIGH (ref 65–99)
Glucose-Capillary: 143 mg/dL — ABNORMAL HIGH (ref 65–99)

## 2015-03-06 NOTE — Consult Note (Signed)
Reason for Consult: Colitis Referring Physician: [facility]  [name] HPI: This is a 69 year old female who was admitted with abdominal pain.  Her pain initially started acutely one day prior to admission and it was generalized.  Subsequently the pain localized more in the LUQ region, however, the CT scan in the ER revealed a colitis at the hepatic flexure.  She is s/p sigmoid colon resection for diverticulitis in 2009/2010.  The patient reported an ileostomy and then reanastamosis, which failed.  I am unable to locate those prior notes from the system.  No reports of any fever and she is feeling better with her antibiotic treatment.  The CT scan was not certain if this colitis was secondary to infectious, inflammatory, or an ischemic source.  Past Medical History  Diagnosis Date  . Diabetes mellitus   . Hypertension   . Parkinson disease ([place]) 2010  . Bleeding nose     Past Surgical History  Procedure Laterality Date  . Colon surgery      colectomy and reversal after diverticular ds  . Cholecystectomy    . Appendectomy    . Total knee arthroplasty      right  . Tonsillectomy      Family History  Problem Relation Age of Onset  . Diabetes Mellitus II Sister   . Colon cancer Neg Hx   . Diabetes Mellitus II Son     Social History:  reports that she quit smoking about 27 years ago. She has never used smokeless tobacco. She reports that she does not drink alcohol or use illicit drugs.  Allergies:  Allergies  Allergen Reactions  . Morphine And Related Nausea And Vomiting and Other (See Comments)    headache    Medications:  Scheduled: . ALPRAZolam  1 mg Oral TID  . amLODipine  10 mg Oral Daily  . benazepril  20 mg Oral Daily  . carbidopa-levodopa  1 tablet Oral Daily  . ciprofloxacin  400 mg Intravenous Q12H  . enoxaparin (LOVENOX) injection  60 mg Subcutaneous Q24H  . insulin aspart  0-9 Units Subcutaneous TID WC  . insulin glargine  40 Units Subcutaneous  QHS  . metronidazole  500 mg Intravenous Q8H  . mometasone-formoterol  2 puff Inhalation BID  . pantoprazole  40 mg Oral Daily  . sertraline  100 mg Oral Daily  . simvastatin  20 mg Oral QHS   Continuous:   Results for orders placed or performed during the hospital encounter of 03/02/15 (from the past 24 hour(s))  Glucose, capillary     Status: Abnormal   Collection Time: 03/05/15 12:06 PM  Result Value Ref Range   Glucose-Capillary 215 (H) 65 - 99 mg/dL  Glucose, capillary     Status: Abnormal   Collection Time: 03/05/15  5:27 PM  Result Value Ref Range   Glucose-Capillary 146 (H) 65 - 99 mg/dL  Glucose, capillary     Status: Abnormal   Collection Time: 03/05/15  8:57 PM  Result Value Ref Range   Glucose-Capillary 189 (H) 65 - 99 mg/dL  CBC     Status: Abnormal   Collection Time: 03/06/15  6:30 AM  Result Value Ref Range   WBC 5.2 4.0 - 10.5 K/uL   RBC 4.12 3.87 - 5.11 MIL/uL   Hemoglobin 11.8 (L) 12.0 - 15.0 g/dL   HCT 35.9 (L) 36.0 - 46.0 %   MCV 87.1 78.0 - 100.0 fL   MCH 28.6 26.0 - 34.0 pg  MCHC 32.9 30.0 - 36.0 g/dL   RDW 13.9 11.5 - 15.5 %   Platelets 167 150 - 400 K/uL  Glucose, capillary     Status: Abnormal   Collection Time: 03/06/15  7:53 AM  Result Value Ref Range   Glucose-Capillary 107 (H) 65 - 99 mg/dL     No results found.  ROS:  As stated above in the HPI otherwise negative.  Blood pressure 119/62, pulse 58, temperature 97.4 F (36.3 C), temperature source Oral, resp. rate 16, height '5\' 4"'$  (1.626 m), weight 120.87 kg (266 lb 7.5 oz), SpO2 95 %.    PE: Gen: NAD, Alert and Oriented HEENT:  [place]/AT, EOMI Neck: Supple, no LAD Lungs: CTA Bilaterally CV: RRR without M/G/R ABM: Soft, NTND, +BS Ext: No C/C/E  Assessment/Plan: 1) Colitis. 2) ABM pain. 3) Diverticula.   She has improved with the use of antibiotics, suggesting an infectious source.  In fact, she desires to go home today.  The patient has pan diverticula and it is possible she has a  diverticulitis, but it is difficult to discern on the CT scan.  Plan: 1) Continue with antibiotics. 2) ? D/C home tomorrow.  Enza Shone D 03/06/2015, 11:19 AM

## 2015-03-06 NOTE — Progress Notes (Signed)
PATIENT DETAILS Name: [name] Age: 69 y.o. Sex: female Date of Birth: 09-Jul-1945 Admit Date: 03/02/2015 Admitting Physician Rise Patience, MD FTD:DUKGURK BUTLER, DO  Brief narrative: 69 year old female with history of essential hypertension, Parkinson's disease, anxiety and dyslipidemia presented with abdominal pain. CT of the abdomen showed colitis involving the hepatic flexure. Patient was admitted, started on IV antibiotics and provided other supportive measures. Although improved, she continues to have intermittent abdominal pain. See below for further details  Subjective:  Asian ambulating in the hallway, denies any chest or abdominal pain, feels better, no nausea vomiting or diarrhea today.  Assessment/Plan:  Colitis: Etiology either infectious or ischemic colitis. she has shown considerable improvement with bowel rest, IV fluids, IV Cipro Flagyl. GI physician Dr. Benson Norway on board. Advance diet. If stable discharge in the morning with outpatient GI follow-up for EGD/colonoscopy is needed.   Essential hypertension: Controlled with amlodipine and benazepril  Hypokalemia: Repleted   Parkinson's disease: Continue with Sinemet-appears stable  Anxiety: Continue with Xanax and sertraline  Dyslipidemia: Continue statin  Bronchial asthma: Lungs clear-appears stable-continue as needed bronchodilators  Diabetes mellitus type 2, controlled: CBGs stable, since on full liquids-continue with Lantus 40 units along with SSI.  GERD: Continue PPI    Disposition: Likely discharge in the a.m.  Antimicrobial agents  See below  Anti-infectives    Start     Dose/Rate Route Frequency Ordered Stop   03/03/15 1000  ciprofloxacin (CIPRO) IVPB 400 mg     400 mg 200 mL/hr over 60 Minutes Intravenous Every 12 hours 03/03/15 0017     03/03/15 0800  metroNIDAZOLE (FLAGYL) IVPB 500 mg     500 mg 100 mL/hr over 60 Minutes Intravenous Every 8 hours 03/03/15 0009      03/02/15 2200  ciprofloxacin (CIPRO) IVPB 400 mg     400 mg 200 mL/hr over 60 Minutes Intravenous  Once 03/02/15 2146 03/02/15 2259   03/02/15 2200  metroNIDAZOLE (FLAGYL) IVPB 500 mg     500 mg 100 mL/hr over 60 Minutes Intravenous  Once 03/02/15 2146 03/03/15 0028      DVT Prophylaxis: Prophylactic Lovenox   Code Status:  DNR  Family Communication None at bedside  Procedures: CT scan abdomen and pelvis. Hepatic flexure colitis, few diverticuli and fatty liver  CONSULTS:  GI  Time spent 25 minutes-Greater than 50% of this time was spent in counseling, explanation of diagnosis, planning of further management, and coordination of care.  MEDICATIONS: Scheduled Meds: . ALPRAZolam  1 mg Oral TID  . amLODipine  10 mg Oral Daily  . benazepril  20 mg Oral Daily  . carbidopa-levodopa  1 tablet Oral Daily  . ciprofloxacin  400 mg Intravenous Q12H  . enoxaparin (LOVENOX) injection  60 mg Subcutaneous Q24H  . insulin aspart  0-9 Units Subcutaneous TID WC  . insulin glargine  40 Units Subcutaneous QHS  . metronidazole  500 mg Intravenous Q8H  . mometasone-formoterol  2 puff Inhalation BID  . pantoprazole  40 mg Oral Daily  . sertraline  100 mg Oral Daily  . simvastatin  20 mg Oral QHS   Continuous Infusions:   PRN Meds:.acetaminophen **OR** acetaminophen, albuterol, morphine injection, ondansetron **OR** ondansetron (ZOFRAN) IV    PHYSICAL EXAM: Vital signs in last 24 hours: Filed Vitals:   03/05/15 2015 03/05/15 2132 03/06/15 0524 03/06/15 0712  BP:  116/57 119/62   Pulse: 64 66 58  Temp:  98.5 F (36.9 C) 97.4 F (36.3 C)   TempSrc:  Oral Oral   Resp: '18 20 16   '$ Height:      Weight:      SpO2: 93% 94% 95% 95%    Weight change:  Filed Weights   03/02/15 1614 03/02/15 2249  Weight: 116.121 kg (256 lb) 120.87 kg (266 lb 7.5 oz)   Body mass index is 45.72 kg/(m^2).   Gen Exam: Awake and alert with clear speech.   Neck: Supple, No JVD.  Chest: B/L  Clear.   CVS: S1 S2 Regular, no murmurs.  Abdomen: soft, but mildly tender in the right mid upper abdominal area just lateral to the umbilicus. Bowel sounds present. Nondistended. No rebound or guarding.  Extremities: no edema, lower extremities warm to touch Neurologic: Non Focal.   Skin: No Rash.   Wounds: N/A.    Intake/Output from previous day:  Intake/Output Summary (Last 24 hours) at 03/06/15 1018 Last data filed at 03/06/15 0946  Gross per 24 hour  Intake 1258.75 ml  Output      0 ml  Net 1258.75 ml     LAB RESULTS: CBC  Recent Labs Lab 03/02/15 1619 03/03/15 0115 03/04/15 0556 03/06/15 0630  WBC 9.4 7.6 8.7 5.2  HGB 13.0 11.9* 13.0 11.8*  HCT 39.3 36.5 39.6 35.9*  PLT 206 174 212 167  MCV 86.0 86.7 87.4 87.1  MCH 28.4 28.3 28.7 28.6  MCHC 33.1 32.6 32.8 32.9  RDW 14.1 14.2 14.4 13.9  LYMPHSABS 2.1 2.2  --   --   MONOABS 0.4 0.4  --   --   EOSABS 0.1 0.2  --   --   BASOSABS 0.0 0.0  --   --     Chemistries   Recent Labs Lab 03/02/15 1619 03/03/15 0115 03/04/15 0556  NA 144 142 139  K 4.6 3.3* 3.9  CL 107 108 106  CO2 '27 26 24  '$ GLUCOSE 140* 111* 79  BUN 10 8 <5*  CREATININE 0.72 0.67 0.65  CALCIUM 9.4 8.7* 8.9  MG  --   --  2.2    CBG:  Recent Labs Lab 03/05/15 0812 03/05/15 1206 03/05/15 1727 03/05/15 2057 03/06/15 0753  GLUCAP 116* 215* 146* 189* 107*    GFR Estimated Creatinine Clearance: 87.5 mL/min (by C-G formula based on Cr of 0.65).  Coagulation profile No results for input(s): INR, PROTIME in the last 168 hours.  Cardiac Enzymes No results for input(s): CKMB, TROPONINI, MYOGLOBIN in the last 168 hours.  Invalid input(s): CK  Invalid input(s): POCBNP No results for input(s): DDIMER in the last 72 hours. No results for input(s): HGBA1C in the last 72 hours. No results for input(s): CHOL, HDL, LDLCALC, TRIG, CHOLHDL, LDLDIRECT in the last 72 hours. No results for input(s): TSH, T4TOTAL, T3FREE, THYROIDAB in the last 72  hours.  Invalid input(s): FREET3 No results for input(s): VITAMINB12, FOLATE, FERRITIN, TIBC, IRON, RETICCTPCT in the last 72 hours. No results for input(s): LIPASE, AMYLASE in the last 72 hours.  Urine Studies No results for input(s): UHGB, CRYS in the last 72 hours.  Invalid input(s): UACOL, UAPR, USPG, UPH, UTP, UGL, UKET, UBIL, UNIT, UROB, ULEU, UEPI, UWBC, URBC, UBAC, CAST, UCOM, BILUA  MICROBIOLOGY: No results found for this or any previous visit (from the past 240 hour(s)).  RADIOLOGY STUDIES/RESULTS: Ct Abdomen Pelvis W Contrast  03/02/2015  CLINICAL DATA:  Abdominal pain since last night. EXAM: CT ABDOMEN AND PELVIS WITH  CONTRAST TECHNIQUE: Multidetector CT imaging of the abdomen and pelvis was performed using the standard protocol following bolus administration of intravenous contrast. CONTRAST:  133m OMNIPAQUE IOHEXOL 300 MG/ML  SOLN COMPARISON:  CT 04/13/2011 FINDINGS: Lower chest: The included lung bases are clear. Tiny subpleural 3 mm nodule in the right lower lobe. Liver: Decreased density consistent with steatosis. No focal lesion. Hepatobiliary: Postcholecystectomy with clips in the gallbladder fossa. Unchanged biliary prominence, sequela of cholecystectomy. Pancreas: Normal. Spleen: Normal. Adrenal glands: No nodule. Kidneys: Symmetric renal enhancement and excretion. No hydronephrosis. Stomach/Bowel: Stomach is decompressed. There are no dilated or thickened small bowel loops. Moderate segment of colonic wall thickening with adjacent pericolonic inflammatory change at the hepatic flexure of the colon. Scattered diverticular throughout the distal colon. No diverticulitis. Enteric chain sutures noted in the sigmoid colon. The appendix is surgically absent. Vascular/Lymphatic: No retroperitoneal adenopathy. Abdominal aorta is normal in caliber. Moderate atherosclerosis. Reproductive: Uterus is surgically absent. Left ovary quiescent, right ovary not definitively seen. No adnexal  mass. Bladder: Minimally distended, not well evaluated. Other: No free air, free fluid, or intra-abdominal fluid collection. Fat containing umbilical hernia, no bowel involvement. Small fat containing supraumbilical ventral abdominal wall hernia, which contains small focus of transverse colon, no wall thickening at this site. Musculoskeletal: There are no acute or suspicious osseous abnormalities. Multilevel facet arthropathy in the lower lumbar spine. Scattered degenerative disc disease. IMPRESSION: 1. Colitis involving the hepatic flexure of the colon, this may be infectious, inflammatory, or ischemic given location. 2. Small Richter hernia of the supraumbilical abdominal wall without bowel compromise. 3. Incidental findings of diverticulosis and hepatic steatosis. Electronically Signed   By: MJeb LeveringM.D.   On: 03/02/2015 21:09    SThurnell Lose MD  [facility] Pager:[phone] If 7PM-7AM, please contact night-coverage [email or web address] Password [email or web address] 03/06/2015, 10:18 AM   LOS: 3 days

## 2015-03-06 NOTE — Progress Notes (Signed)
Patient places herself on CPAP. Machine is ready and she said she would call if anything needed.

## 2015-03-06 NOTE — Progress Notes (Signed)
ANTIBIOTIC CONSULT NOTE - Follow Up  Pharmacy Consult for Ciprofloxacin  Indication: Intra-abdominal infection  Allergies  Allergen Reactions  . Morphine And Related Nausea And Vomiting and Other (See Comments)    headache    Patient Measurements: Height: '5\' 4"'$  (162.6 cm) Weight: 266 lb 7.5 oz (120.87 kg) IBW/kg (Calculated) : 54.7  Vital Signs: Temp: 97.4 F (36.3 C) (11/12 0524) Temp Source: Oral (11/12 0524) BP: 119/62 mmHg (11/12 0524) Pulse Rate: 58 (11/12 0524) Intake/Output from previous day: 11/11 0701 - 11/12 0700 In: 460 [P.O.:460] Out: -  Intake/Output from this shift: Total I/O In: 1268.8 [P.O.:550; I.V.:618.8; IV Piggyback:100] Out: -   Labs:  Recent Labs  03/04/15 0556 03/06/15 0630  WBC 8.7 5.2  HGB 13.0 11.8*  PLT 212 167  CREATININE 0.65  --    Estimated Creatinine Clearance: 87.5 mL/min (by C-G formula based on Cr of 0.65). No results for input(s): VANCOTROUGH, VANCOPEAK, VANCORANDOM, GENTTROUGH, GENTPEAK, GENTRANDOM, TOBRATROUGH, TOBRAPEAK, TOBRARND, AMIKACINPEAK, AMIKACINTROU, AMIKACIN in the last 72 hours.   Microbiology: No results found for this or any previous visit (from the past 720 hour(s)).  Medical History: Past Medical History  Diagnosis Date  . Diabetes mellitus   . Hypertension   . Parkinson disease ([place]) 2010  . Bleeding nose     Medications:  Prescriptions prior to admission  Medication Sig Dispense Refill Last Dose  . ALPRAZolam (XANAX) 1 MG tablet Take 1 mg by mouth 3 (three) times daily.     03/02/2015 at Unknown time  . amLODipine (NORVASC) 10 MG tablet Take 10 mg by mouth daily.   03/02/2015 at Unknown time  . benazepril (LOTENSIN) 20 MG tablet Take 20 mg by mouth daily.     03/02/2015 at Unknown time  . carbidopa-levodopa (SINEMET CR) 50-200 MG per tablet Take 1 tablet by mouth daily.     03/02/2015 at Unknown time  . DEXILANT 60 MG capsule Take 1 capsule by mouth daily as needed. For acid reflux   03/02/2015 at  Unknown time  . Fluticasone-Salmeterol (ADVAIR) 500-50 MCG/DOSE AEPB Inhale 1 puff into the lungs every 12 (twelve) hours.     03/02/2015 at Unknown time  . insulin glargine (LANTUS) 100 UNIT/ML injection Inject 150 Units into the skin at bedtime.    03/01/2015 at Unknown time  . oxyCODONE-acetaminophen (PERCOCET) 10-325 MG per tablet Take 1 tablet by mouth every 6 (six) hours as needed for pain.    03/01/2015 at Unknown time  . PROAIR HFA 108 (90 BASE) MCG/ACT inhaler Inhale 1 puff into the lungs every 4 (four) hours as needed. For shortness of breath   03/01/2015 at Unknown time  . sertraline (ZOLOFT) 100 MG tablet Take 100 mg by mouth daily.   03/01/2015 at Unknown time  . simvastatin (ZOCOR) 20 MG tablet Take 20 mg by mouth at bedtime.     03/01/2015 at Unknown time   Assessment: 58 YOF with abdominal pain and associated nausea without vomiting. CT scan showed colitis. Pharmacy consulted to start IV Ciprofloxacin. Metronidazole for anaerobic coverage. WBC wnl. Afebrile. CrCl ~ 88 mL/min   Goal of Therapy:  Resolution of infection   Plan:  Ciprofloxacin 400 mg q12h  Metronidazole '500mg'$  q8h Monitor CBC, renal fx, and clinical progress   Stephens November, PharmD. PGY1 Resident Pager [phone]

## 2015-03-07 LAB — GLUCOSE, CAPILLARY: GLUCOSE-CAPILLARY: 107 mg/dL — AB (ref 65–99)

## 2015-03-07 MED ORDER — CIPROFLOXACIN HCL 500 MG PO TABS: 500.0000 mg | ORAL_TABLET | Freq: Two times a day (BID) | ORAL | Status: DC

## 2015-03-07 MED ORDER — METRONIDAZOLE 500 MG PO TABS: 500.0000 mg | ORAL_TABLET | Freq: Three times a day (TID) | ORAL | Status: DC

## 2015-03-07 NOTE — Discharge Instructions (Signed)
Follow with Primary MD CYNTHIA BUTLER, DO in 7 days   Get CBC, CMP, 2 view Chest X ray checked  by Primary MD next visit.    Activity: As tolerated with Full fall precautions use walker/cane & assistance as needed   Disposition Home    Diet: Heart Healthy Low Carb soft die for the next 2-3 days then advance to regular consistency as tolerated  For Heart failure patients - Check your Weight same time everyday, if you gain over 2 pounds, or you develop in leg swelling, experience more shortness of breath or chest pain, call your Primary MD immediately. Follow Cardiac Low Salt Diet and 1.5 lit/day fluid restriction.   On your next visit with your primary care physician please Get Medicines reviewed and adjusted.   Please request your Prim.MD to go over all Hospital Tests and Procedure/Radiological results at the follow up, please get all Hospital records sent to your Prim MD by signing hospital release before you go home.   If you experience worsening of your admission symptoms, develop shortness of breath, life threatening emergency, suicidal or homicidal thoughts you must seek medical attention immediately by calling 911 or calling your MD immediately  if symptoms less severe.  You Must read complete instructions/literature along with all the possible adverse reactions/side effects for all the Medicines you take and that have been prescribed to you. Take any new Medicines after you have completely understood and accpet all the possible adverse reactions/side effects.   Do not drive, operating heavy machinery, perform activities at heights, swimming or participation in water activities or provide baby sitting services if your were admitted for syncope or siezures until you have seen by Primary MD or a Neurologist and advised to do so again.  Do not drive when taking Pain medications.    Do not take more than prescribed Pain, Sleep and Anxiety Medications  Special Instructions: If you  have smoked or chewed Tobacco  in the last 2 yrs please stop smoking, stop any regular Alcohol  and or any Recreational drug use.  Wear Seat belts while driving.   Please note  You were cared for by a hospitalist during your hospital stay. If you have any questions about your discharge medications or the care you received while you were in the hospital after you are discharged, you can call the unit and asked to speak with the hospitalist on call if the hospitalist that took care of you is not available. Once you are discharged, your primary care physician will handle any further medical issues. Please note that NO REFILLS for any discharge medications will be authorized once you are discharged, as it is imperative that you return to your primary care physician (or establish a relationship with a primary care physician if you do not have one) for your aftercare needs so that they can reassess your need for medications and monitor your lab values.

## 2015-03-07 NOTE — Discharge Summary (Signed)
[name], is a 69 y.o. female  DOB July 09, 1945  MRN [id number].  Admission date:  03/02/2015  Admitting Physician  Rise Patience, MD  Discharge Date:  03/07/2015   Primary MD  Octavio Graves, DO  Recommendations for primary care physician for things to follow:   Check CBC, BMP next visit. Outpatient GI follow-up   Admission Diagnosis  Colitis [K52.9]   Discharge Diagnosis  Colitis [K52.9]    Principal Problem:   Colitis Active Problems:   Essential hypertension   Asthma   Diabetes mellitus type 2, controlled ([place])   Hyperlipidemia      Past Medical History  Diagnosis Date  . Diabetes mellitus   . Hypertension   . Parkinson disease ([place]) 2010  . Bleeding nose     Past Surgical History  Procedure Laterality Date  . Colon surgery      colectomy and reversal after diverticular ds  . Cholecystectomy    . Appendectomy    . Total knee arthroplasty      right  . Tonsillectomy         HPI  from the history and physical done on the day of admission:    [name] is a 69 y.o. female with previous history of diverticulitis requiring hemicolectomy and colostomy bag which was reversed presents to the ER because of abdominal pain which is gradually worsening over the last 24 hours. Pain is mostly in the right side of the abdomen. Has had some nausea but denies any vomiting or diarrhea. Denies any fever or chills. Denies use of any recent antibiotics. In the ER CT abdomen and pelvis shows colitis involving the hepatic flexure with Darron Doom with no bowels in it. Patient's pain improved with pain medication. Patient has been admitted for further management. Patient states she was not able to eat the whole day because of the pain.      Hospital Course:     Colitis: Etiology either  infectious or ischemic colitis, she has shown considerable improvement with bowel rest, IV fluids, IV Cipro Flagyl. GI physician Dr. Benson Norway was on board. Her diet was advanced which he tolerated well, now completely symptom free, will follow with GI outpatient discussed with Dr. Benson Norway agrees with the plan. Will be placed on 3 more days of oral Cipro Flagyl and discharged home. Advised to take soft diet for 3-4 days then advance to regular consistency as tolerated.  Essential hypertension: Controlled with amlodipine and benazepril continue regimen.  Parkinson's disease: Continue with Sinemet-appears stable  Anxiety: Continue with Xanax and sertraline  Dyslipidemia: Continue statin  Bronchial asthma: Lungs clear-appears stable-continue as needed bronchodilators  Diabetes mellitus type 2, controlled: CBGs stable, home regimen.  GERD: Continue PPI       Discharge Condition: Stable  Follow UP  Follow-up Information    Follow up with [place], DO. Schedule an appointment as soon as possible for a visit in 1 week.   Contact information:   [address] [place]  [id number] [phone]  Follow up with HUNG,PATRICK D, MD. Schedule an appointment as soon as possible for a visit in 1 week.   Specialty:  Gastroenterology   Contact information:   [address], [address] [place] [address] [phone]        Consults obtained - GI  Diet and Activity recommendation: See Discharge Instructions below  Discharge Instructions       Discharge Instructions    Discharge instructions    Complete by:  As directed   Follow with Primary MD CYNTHIA BUTLER, DO in 7 days   Get CBC, CMP, 2 view Chest X ray checked  by Primary MD next visit.    Activity: As tolerated with Full fall precautions use walker/cane & assistance as needed   Disposition Home    Diet: Heart Healthy Low Carb soft die for the next 2-3 days then advance to regular consistency as tolerated  For  Heart failure patients - Check your Weight same time everyday, if you gain over 2 pounds, or you develop in leg swelling, experience more shortness of breath or chest pain, call your Primary MD immediately. Follow Cardiac Low Salt Diet and 1.5 lit/day fluid restriction.   On your next visit with your primary care physician please Get Medicines reviewed and adjusted.   Please request your Prim.MD to go over all Hospital Tests and Procedure/Radiological results at the follow up, please get all Hospital records sent to your Prim MD by signing hospital release before you go home.   If you experience worsening of your admission symptoms, develop shortness of breath, life threatening emergency, suicidal or homicidal thoughts you must seek medical attention immediately by calling 911 or calling your MD immediately  if symptoms less severe.  You Must read complete instructions/literature along with all the possible adverse reactions/side effects for all the Medicines you take and that have been prescribed to you. Take any new Medicines after you have completely understood and accpet all the possible adverse reactions/side effects.   Do not drive, operating heavy machinery, perform activities at heights, swimming or participation in water activities or provide baby sitting services if your were admitted for syncope or siezures until you have seen by Primary MD or a Neurologist and advised to do so again.  Do not drive when taking Pain medications.    Do not take more than prescribed Pain, Sleep and Anxiety Medications  Special Instructions: If you have smoked or chewed Tobacco  in the last 2 yrs please stop smoking, stop any regular Alcohol  and or any Recreational drug use.  Wear Seat belts while driving.   Please note  You were cared for by a hospitalist during your hospital stay. If you have any questions about your discharge medications or the care you received while you were in the hospital  after you are discharged, you can call the unit and asked to speak with the hospitalist on call if the hospitalist that took care of you is not available. Once you are discharged, your primary care physician will handle any further medical issues. Please note that NO REFILLS for any discharge medications will be authorized once you are discharged, as it is imperative that you return to your primary care physician (or establish a relationship with a primary care physician if you do not have one) for your aftercare needs so that they can reassess your need for medications and monitor your lab values.     Increase activity slowly    Complete by:  As directed  Discharge Medications       Medication List    TAKE these medications        ALPRAZolam 1 MG tablet  Commonly known as:  XANAX  Take 1 mg by mouth 3 (three) times daily.     amLODipine 10 MG tablet  Commonly known as:  NORVASC  Take 10 mg by mouth daily.     benazepril 20 MG tablet  Commonly known as:  LOTENSIN  Take 20 mg by mouth daily.     carbidopa-levodopa 50-200 MG tablet  Commonly known as:  SINEMET CR  Take 1 tablet by mouth daily.     ciprofloxacin 500 MG tablet  Commonly known as:  CIPRO  Take 1 tablet (500 mg total) by mouth 2 (two) times daily.     DEXILANT 60 MG capsule  Generic drug:  dexlansoprazole  Take 1 capsule by mouth daily as needed. For acid reflux     Fluticasone-Salmeterol 500-50 MCG/DOSE Aepb  Commonly known as:  ADVAIR  Inhale 1 puff into the lungs every 12 (twelve) hours.     insulin glargine 100 UNIT/ML injection  Commonly known as:  LANTUS  Inject 150 Units into the skin at bedtime.     metroNIDAZOLE 500 MG tablet  Commonly known as:  FLAGYL  Take 1 tablet (500 mg total) by mouth 3 (three) times daily.     oxyCODONE-acetaminophen 10-325 MG tablet  Commonly known as:  PERCOCET  Take 1 tablet by mouth every 6 (six) hours as needed for pain.     PROAIR HFA 108 (90 BASE)  MCG/ACT inhaler  Generic drug:  albuterol  Inhale 1 puff into the lungs every 4 (four) hours as needed. For shortness of breath     sertraline 100 MG tablet  Commonly known as:  ZOLOFT  Take 100 mg by mouth daily.     simvastatin 20 MG tablet  Commonly known as:  ZOCOR  Take 20 mg by mouth at bedtime.        Major procedures and Radiology Reports - PLEASE review detailed and final reports for all details, in brief -       Ct Abdomen Pelvis W Contrast  03/02/2015  CLINICAL DATA:  Abdominal pain since last night. EXAM: CT ABDOMEN AND PELVIS WITH CONTRAST TECHNIQUE: Multidetector CT imaging of the abdomen and pelvis was performed using the standard protocol following bolus administration of intravenous contrast. CONTRAST:  126m OMNIPAQUE IOHEXOL 300 MG/ML  SOLN COMPARISON:  CT 04/13/2011 FINDINGS: Lower chest: The included lung bases are clear. Tiny subpleural 3 mm nodule in the right lower lobe. Liver: Decreased density consistent with steatosis. No focal lesion. Hepatobiliary: Postcholecystectomy with clips in the gallbladder fossa. Unchanged biliary prominence, sequela of cholecystectomy. Pancreas: Normal. Spleen: Normal. Adrenal glands: No nodule. Kidneys: Symmetric renal enhancement and excretion. No hydronephrosis. Stomach/Bowel: Stomach is decompressed. There are no dilated or thickened small bowel loops. Moderate segment of colonic wall thickening with adjacent pericolonic inflammatory change at the hepatic flexure of the colon. Scattered diverticular throughout the distal colon. No diverticulitis. Enteric chain sutures noted in the sigmoid colon. The appendix is surgically absent. Vascular/Lymphatic: No retroperitoneal adenopathy. Abdominal aorta is normal in caliber. Moderate atherosclerosis. Reproductive: Uterus is surgically absent. Left ovary quiescent, right ovary not definitively seen. No adnexal mass. Bladder: Minimally distended, not well evaluated. Other: No free air, free fluid,  or intra-abdominal fluid collection. Fat containing umbilical hernia, no bowel involvement. Small fat containing supraumbilical ventral abdominal wall hernia, which contains  small focus of transverse colon, no wall thickening at this site. Musculoskeletal: There are no acute or suspicious osseous abnormalities. Multilevel facet arthropathy in the lower lumbar spine. Scattered degenerative disc disease. IMPRESSION: 1. Colitis involving the hepatic flexure of the colon, this may be infectious, inflammatory, or ischemic given location. 2. Small Richter hernia of the supraumbilical abdominal wall without bowel compromise. 3. Incidental findings of diverticulosis and hepatic steatosis. Electronically Signed   By: Jeb Levering M.D.   On: 03/02/2015 21:09    Micro Results      No results found for this or any previous visit (from the past 240 hour(s)).     Today   Subjective    [name] today has no headache,no chest abdominal pain,no new weakness tingling or numbness, feels much better wants to go home today    Objective   Blood pressure 116/51, pulse 59, temperature 98 F (36.7 C), temperature source Oral, resp. rate 18, height '5\' 4"'$  (1.626 m), weight 120.87 kg (266 lb 7.5 oz), SpO2 95 %.   Intake/Output Summary (Last 24 hours) at 03/07/15 0938 Last data filed at 03/07/15 0427  Gross per 24 hour  Intake   2070 ml  Output      0 ml  Net   2070 ml    Exam Awake Alert, Oriented x 3, No new F.N deficits, Normal affect [place].AT,PERRAL Supple Neck,No JVD, No cervical lymphadenopathy appriciated.  Symmetrical Chest wall movement, Good air movement bilaterally, CTAB RRR,No Gallops,Rubs or new Murmurs, No Parasternal Heave +ve B.Sounds, Abd Soft, Non tender, No organomegaly appriciated, No rebound -guarding or rigidity. No Cyanosis, Clubbing or edema, No new Rash or bruise   Data Review   CBC w Diff: Lab Results  Component Value Date   WBC 5.2 03/06/2015   HGB 11.8*  03/06/2015   HCT 35.9* 03/06/2015   PLT 167 03/06/2015   LYMPHOPCT 29 03/03/2015   MONOPCT 5 03/03/2015   EOSPCT 2 03/03/2015   BASOPCT 0 03/03/2015    CMP: Lab Results  Component Value Date   NA 139 03/04/2015   K 3.9 03/04/2015   CL 106 03/04/2015   CO2 24 03/04/2015   BUN <5* 03/04/2015   CREATININE 0.65 03/04/2015   PROT 6.0* 03/03/2015   ALBUMIN 3.1* 03/03/2015   BILITOT 0.4 03/03/2015   ALKPHOS 61 03/03/2015   AST 19 03/03/2015   ALT 14 03/03/2015  .   Total Time in preparing paper work, data evaluation and todays exam - 35 minutes  Thurnell Lose M.D on 03/07/2015 at 9:38 AM  [facility]   Office  [phone]

## 2015-03-07 NOTE — Progress Notes (Signed)
03/07/15 Patient being discharged home today, IV site removed, and discharge instructions reviewed with patient.

## 2015-03-07 NOTE — Progress Notes (Signed)
Subjective: No further abdominal pain.  Feeling well and she desires to go home.  Objective: Vital signs in last 24 hours: Temp:  [98 F (36.7 C)-98.6 F (37 C)] 98 F (36.7 C) (11/13 0532) Pulse Rate:  [58-61] 59 (11/13 0532) Resp:  [18] 18 (11/13 0532) BP: (101-116)/(39-51) 116/51 mmHg (11/13 0532) SpO2:  [95 %-97 %] 95 % (11/13 0532) Last BM Date: 03/06/15  Intake/Output from previous day: 11/12 0701 - 11/13 0700 In: 2788.8 [P.O.:1390; I.V.:618.8; IV Piggyback:700] Out: -  Intake/Output this shift:    General appearance: alert and no distress GI: soft, non-tender; bowel sounds normal; no masses,  no organomegaly  Lab Results:  Recent Labs  03/06/15 0630  WBC 5.2  HGB 11.8*  HCT 35.9*  PLT 167   BMET No results for input(s): NA, K, CL, CO2, GLUCOSE, BUN, CREATININE, CALCIUM in the last 72 hours. LFT No results for input(s): PROT, ALBUMIN, AST, ALT, ALKPHOS, BILITOT, BILIDIR, IBILI in the last 72 hours. PT/INR No results for input(s): LABPROT, INR in the last 72 hours. Hepatitis Panel No results for input(s): HEPBSAG, HCVAB, HEPAIGM, HEPBIGM in the last 72 hours. C-Diff No results for input(s): CDIFFTOX in the last 72 hours. Fecal Lactopherrin No results for input(s): FECLLACTOFRN in the last 72 hours.  Studies/Results: No results found.  Medications:  Scheduled: . ALPRAZolam  1 mg Oral TID  . amLODipine  10 mg Oral Daily  . benazepril  20 mg Oral Daily  . carbidopa-levodopa  1 tablet Oral Daily  . ciprofloxacin  400 mg Intravenous Q12H  . enoxaparin (LOVENOX) injection  60 mg Subcutaneous Q24H  . insulin aspart  0-9 Units Subcutaneous TID WC  . insulin glargine  40 Units Subcutaneous QHS  . metronidazole  500 mg Intravenous Q8H  . mometasone-formoterol  2 puff Inhalation BID  . pantoprazole  40 mg Oral Daily  . sertraline  100 mg Oral Daily  . simvastatin  20 mg Oral QHS   Continuous:   Assessment/Plan: 1) Colitis - Presumed infectious. 2) ABM  pain - resolved.   The patient is well at this time.  No complaints.    Plan: 1) Okay to D/C home. 2) Treat with antibiotics for a total of 10 days. 3) Follow up with [facility] in 4 weeks.   LOS: 4 days   Ascencion Stegner D 03/07/2015, 8:49 AM

## 2015-03-08 ENCOUNTER — Telehealth: Payer: Self-pay | Admitting: Gastroenterology

## 2015-03-08 NOTE — Telephone Encounter (Signed)
Per Dr. Ulyses Amor inpatient note patient was to follow up in 1 month.  She is scheduled for follow up for 04/07/15

## 2015-04-07 ENCOUNTER — Ambulatory Visit: Payer: Medicare Other | Admitting: Gastroenterology

## 2015-04-25 DIAGNOSIS — C801 Malignant (primary) neoplasm, unspecified: Secondary | ICD-10-CM

## 2015-04-25 DIAGNOSIS — R911 Solitary pulmonary nodule: Secondary | ICD-10-CM

## 2015-04-25 HISTORY — DX: Solitary pulmonary nodule: R91.1

## 2015-04-25 HISTORY — DX: Malignant (primary) neoplasm, unspecified: C80.1

## 2015-09-21 ENCOUNTER — Other Ambulatory Visit (HOSPITAL_COMMUNITY): Payer: Self-pay | Admitting: *Deleted

## 2015-09-21 DIAGNOSIS — R911 Solitary pulmonary nodule: Secondary | ICD-10-CM

## 2015-09-24 ENCOUNTER — Ambulatory Visit (HOSPITAL_COMMUNITY)
Admission: RE | Admit: 2015-09-24 | Discharge: 2015-09-24 | Disposition: A | Discharge status: Home / Self Care | Payer: Medicare Other | Source: Ambulatory Visit | Attending: *Deleted | Admitting: *Deleted

## 2015-09-24 ENCOUNTER — Encounter (HOSPITAL_COMMUNITY): Payer: Self-pay

## 2015-09-24 DIAGNOSIS — R918 Other nonspecific abnormal finding of lung field: Secondary | ICD-10-CM | POA: Insufficient documentation

## 2015-09-24 DIAGNOSIS — R911 Solitary pulmonary nodule: Secondary | ICD-10-CM | POA: Diagnosis present

## 2015-09-24 MED ORDER — IOPAMIDOL (ISOVUE-300) INJECTION 61%
INTRAVENOUS | Status: AC
  Administered 2015-09-24: 80 mL
  Filled 2015-09-24: qty 100

## 2015-11-08 ENCOUNTER — Institutional Professional Consult (permissible substitution): Payer: Medicare Other | Admitting: Pulmonary Disease

## 2016-02-26 ENCOUNTER — Emergency Department (HOSPITAL_COMMUNITY): Payer: Medicare Other

## 2016-02-26 ENCOUNTER — Encounter (HOSPITAL_COMMUNITY): Payer: Self-pay | Admitting: Nurse Practitioner

## 2016-02-26 ENCOUNTER — Observation Stay (HOSPITAL_COMMUNITY)
Admission: EM | Admit: 2016-02-26 | Discharge: 2016-02-26 | Disposition: A | Discharge status: Home / Self Care | Payer: Medicare Other | Attending: Physician Assistant | Admitting: Physician Assistant

## 2016-02-26 DIAGNOSIS — K648 Other hemorrhoids: Secondary | ICD-10-CM | POA: Diagnosis not present

## 2016-02-26 DIAGNOSIS — Z96651 Presence of right artificial knee joint: Secondary | ICD-10-CM | POA: Insufficient documentation

## 2016-02-26 DIAGNOSIS — Z794 Long term (current) use of insulin: Secondary | ICD-10-CM | POA: Insufficient documentation

## 2016-02-26 DIAGNOSIS — M1990 Unspecified osteoarthritis, unspecified site: Secondary | ICD-10-CM | POA: Insufficient documentation

## 2016-02-26 DIAGNOSIS — I4510 Unspecified right bundle-branch block: Secondary | ICD-10-CM | POA: Insufficient documentation

## 2016-02-26 DIAGNOSIS — Z87891 Personal history of nicotine dependence: Secondary | ICD-10-CM | POA: Insufficient documentation

## 2016-02-26 DIAGNOSIS — G4733 Obstructive sleep apnea (adult) (pediatric): Secondary | ICD-10-CM | POA: Insufficient documentation

## 2016-02-26 DIAGNOSIS — R0789 Other chest pain: Principal | ICD-10-CM | POA: Insufficient documentation

## 2016-02-26 DIAGNOSIS — I10 Essential (primary) hypertension: Secondary | ICD-10-CM | POA: Insufficient documentation

## 2016-02-26 DIAGNOSIS — G250 Essential tremor: Secondary | ICD-10-CM | POA: Diagnosis not present

## 2016-02-26 DIAGNOSIS — G20 Parkinson's disease: Secondary | ICD-10-CM | POA: Insufficient documentation

## 2016-02-26 DIAGNOSIS — K529 Noninfective gastroenteritis and colitis, unspecified: Secondary | ICD-10-CM | POA: Diagnosis not present

## 2016-02-26 DIAGNOSIS — D509 Iron deficiency anemia, unspecified: Secondary | ICD-10-CM | POA: Insufficient documentation

## 2016-02-26 DIAGNOSIS — K5732 Diverticulitis of large intestine without perforation or abscess without bleeding: Secondary | ICD-10-CM | POA: Diagnosis not present

## 2016-02-26 DIAGNOSIS — Z8711 Personal history of peptic ulcer disease: Secondary | ICD-10-CM | POA: Insufficient documentation

## 2016-02-26 DIAGNOSIS — F419 Anxiety disorder, unspecified: Secondary | ICD-10-CM | POA: Insufficient documentation

## 2016-02-26 DIAGNOSIS — Z86010 Personal history of colonic polyps: Secondary | ICD-10-CM | POA: Insufficient documentation

## 2016-02-26 DIAGNOSIS — Z800 Family history of malignant neoplasm of digestive organs: Secondary | ICD-10-CM | POA: Insufficient documentation

## 2016-02-26 DIAGNOSIS — K219 Gastro-esophageal reflux disease without esophagitis: Secondary | ICD-10-CM | POA: Diagnosis not present

## 2016-02-26 DIAGNOSIS — E785 Hyperlipidemia, unspecified: Secondary | ICD-10-CM | POA: Insufficient documentation

## 2016-02-26 DIAGNOSIS — J45909 Unspecified asthma, uncomplicated: Secondary | ICD-10-CM | POA: Insufficient documentation

## 2016-02-26 DIAGNOSIS — R079 Chest pain, unspecified: Secondary | ICD-10-CM | POA: Diagnosis present

## 2016-02-26 DIAGNOSIS — Z833 Family history of diabetes mellitus: Secondary | ICD-10-CM | POA: Insufficient documentation

## 2016-02-26 DIAGNOSIS — E119 Type 2 diabetes mellitus without complications: Secondary | ICD-10-CM | POA: Diagnosis not present

## 2016-02-26 DIAGNOSIS — Z9049 Acquired absence of other specified parts of digestive tract: Secondary | ICD-10-CM | POA: Insufficient documentation

## 2016-02-26 DIAGNOSIS — Z79899 Other long term (current) drug therapy: Secondary | ICD-10-CM | POA: Insufficient documentation

## 2016-02-26 DIAGNOSIS — Z885 Allergy status to narcotic agent status: Secondary | ICD-10-CM | POA: Insufficient documentation

## 2016-02-26 LAB — CBC
HCT: 39.7 % (ref 36.0–46.0)
HEMOGLOBIN: 13.6 g/dL (ref 12.0–15.0)
MCH: 28.9 pg (ref 26.0–34.0)
MCHC: 34.3 g/dL (ref 30.0–36.0)
MCV: 84.5 fL (ref 78.0–100.0)
Platelets: 227 10*3/uL (ref 150–400)
RBC: 4.7 MIL/uL (ref 3.87–5.11)
RDW: 13.7 % (ref 11.5–15.5)
WBC: 9.5 10*3/uL (ref 4.0–10.5)

## 2016-02-26 LAB — BASIC METABOLIC PANEL
ANION GAP: 9 (ref 5–15)
BUN: 11 mg/dL (ref 6–20)
CHLORIDE: 106 mmol/L (ref 101–111)
CO2: 22 mmol/L (ref 22–32)
Calcium: 9.6 mg/dL (ref 8.9–10.3)
Creatinine, Ser: 0.63 mg/dL (ref 0.44–1.00)
GFR calc non Af Amer: >60 mL/min (ref >60)
Glucose, Bld: 229 mg/dL — ABNORMAL HIGH (ref 65–99)
POTASSIUM: 4.3 mmol/L (ref 3.5–5.1)
Sodium: 137 mmol/L (ref 135–145)

## 2016-02-26 LAB — I-STAT TROPONIN, ED
Troponin i, poc: 0 ng/mL (ref 0.00–0.08)
Troponin i, poc: 0 ng/mL (ref 0.00–0.08)

## 2016-02-26 MED ORDER — CYCLOBENZAPRINE HCL 10 MG PO TABS: 10.0000 mg | ORAL_TABLET | Freq: Two times a day (BID) | ORAL | 0 refills | Status: DC | PRN

## 2016-02-26 MED ORDER — OXYCODONE-ACETAMINOPHEN 5-325 MG PO TABS
1.0000 | ORAL_TABLET | Freq: Once | ORAL | Status: AC
  Administered 2016-02-26: 1 via ORAL
  Filled 2016-02-26: qty 1

## 2016-02-26 MED ORDER — ASPIRIN 81 MG PO CHEW
324.0000 mg | CHEWABLE_TABLET | Freq: Once | ORAL | Status: AC
  Administered 2016-02-26: 324 mg via ORAL
  Filled 2016-02-26: qty 4

## 2016-02-26 MED ORDER — CYCLOBENZAPRINE HCL 10 MG PO TABS
10.0000 mg | ORAL_TABLET | Freq: Once | ORAL | Status: AC
  Administered 2016-02-26: 10 mg via ORAL
  Filled 2016-02-26: qty 1

## 2016-02-26 MED ORDER — ONDANSETRON HCL 4 MG PO TABS
4.0000 mg | ORAL_TABLET | Freq: Once | ORAL | Status: AC
  Administered 2016-02-26: 4 mg via ORAL
  Filled 2016-02-26: qty 1

## 2016-02-26 NOTE — ED Notes (Signed)
Pt stable, ambulatory, states understanding of discharge instructions 

## 2016-02-26 NOTE — ED Notes (Signed)
Pt brought back from xray.

## 2016-02-26 NOTE — Discharge Instructions (Signed)
We are unsure what caused your neck and chest pain. We are glad it is better, we wanted to admit you and you preferred to [facility] home.  If the pain returns, please return immediately to the ED.  Please follow up with cardiology and your primary care physician.

## 2016-02-26 NOTE — ED Provider Notes (Signed)
[facility] DEPT Provider Note   CSN: [id number] Arrival date & time: 02/26/16  1208     History   Chief Complaint Chief Complaint  Patient presents with  . Chest Pain    HPI [name] is a 70 y.o. female.  HPI   Patient is 70 year old female presenting with atypical chest pain. This started last Wednesday. It is nonexertional. Patient's risk factors are hypertension hyperlipidemia diabetes. Patient's chest pain has been ongoing.  70  year old presenting with dull pain to left chest and rigth jaw and right neck.  Started Wednesday. Pain has been constant. Not exertional. Has R neck pain. No syncope. Took baby asprin Wednesday.   Past Medical History:  Diagnosis Date  . Bleeding nose   . Diabetes mellitus   . Diverticulitis   . Hypertension   . Parkinson disease ([place]) 2010  . Tubular adenoma of colon 05/2007    Patient Active Problem List   Diagnosis Date Noted  . Colitis 03/02/2015  . Diabetes mellitus type 2, controlled ([place]) 03/02/2015  . Hyperlipidemia 03/02/2015  . OSA (obstructive sleep apnea) 05/10/2012  . Essential tremor 03/19/2012  . DIVERTICULITIS OF COLON 02/03/2008  . ANEMIA, IRON DEFICIENCY 12/27/2007  . HEMORRHOIDS, INTERNAL 12/27/2007  . GERD 12/27/2007  . GASTRITIS 12/27/2007  . DIVERTICULOSIS, COLON 12/27/2007  . PUD, HX OF 12/27/2007  . COLONIC POLYPS, HX OF 12/27/2007  . ANXIETY 04/23/2007  . Essential hypertension 04/23/2007  . ARTHRITIS 04/23/2007  . SLEEP APNEA 04/23/2007  . DM 04/25/1991  . Asthma 04/23/1987    Past Surgical History:  Procedure Laterality Date  . APPENDECTOMY    . CHOLECYSTECTOMY    . COLON SURGERY     colectomy and reversal after diverticular ds  . TONSILLECTOMY    . TOTAL KNEE ARTHROPLASTY     right    OB History    No data available       Home Medications    Prior to Admission medications   Medication Sig Start Date End Date Taking? Authorizing Provider  ALPRAZolam Duanne Moron) 1 MG tablet  Take 1 mg by mouth 3 (three) times daily.     Yes Historical Provider, MD  amLODipine (NORVASC) 10 MG tablet Take 10 mg by mouth daily.   Yes Historical Provider, MD  carbidopa-levodopa (SINEMET CR) 50-200 MG per tablet Take 1 tablet by mouth daily.     Yes Historical Provider, MD  DEXILANT 60 MG capsule Take 60 mg by mouth daily. For acid reflux 06/24/13  Yes Historical Provider, MD  Fluticasone-Salmeterol (ADVAIR) 500-50 MCG/DOSE AEPB Inhale 1 puff into the lungs every 12 (twelve) hours.     Yes Historical Provider, MD  furosemide (LASIX) 20 MG tablet Take 20 mg by mouth daily as needed for fluid.  02/14/16  Yes Historical Provider, MD  insulin glargine (LANTUS) 100 UNIT/ML injection Inject 150 Units into the skin at bedtime.    Yes Historical Provider, MD  oxyCODONE-acetaminophen (PERCOCET) 10-325 MG per tablet Take 1 tablet by mouth every 6 (six) hours as needed for pain.  10/03/12  Yes Historical Provider, MD  PROAIR HFA 108 (90 BASE) MCG/ACT inhaler Inhale 2 puffs into the lungs every 4 (four) hours as needed for wheezing or shortness of breath.  06/24/13  Yes Historical Provider, MD  sertraline (ZOLOFT) 100 MG tablet Take 100 mg by mouth daily. 06/29/13  Yes Historical Provider, MD  simvastatin (ZOCOR) 20 MG tablet Take 20 mg by mouth at bedtime.     Yes  Historical Provider, MD  ciprofloxacin (CIPRO) 500 MG tablet Take 1 tablet (500 mg total) by mouth 2 (two) times daily. Patient not taking: Reported on 02/26/2016 03/07/15   Thurnell Lose, MD  metroNIDAZOLE (FLAGYL) 500 MG tablet Take 1 tablet (500 mg total) by mouth 3 (three) times daily. Patient not taking: Reported on 02/26/2016 03/07/15   Thurnell Lose, MD    Family History Family History  Problem Relation Age of Onset  . Diabetes Mellitus II Sister   . Colon cancer Neg Hx   . Diabetes Mellitus II Son     Social History Social History  Substance Use Topics  . Smoking status: Former Smoker    Quit date: 03/20/1987  . Smokeless  tobacco: Never Used  . Alcohol use No     Allergies   Morphine and related   Review of Systems Review of Systems  Constitutional: Negative for fatigue and fever.  Respiratory: Positive for chest tightness.   Cardiovascular: Positive for chest pain.  Musculoskeletal: Negative for back pain.  Neurological: Negative for dizziness, syncope and light-headedness.  Psychiatric/Behavioral: Negative for agitation.  All other systems reviewed and are negative.    Physical Exam Updated Vital Signs BP 152/63   Pulse 82   Temp 97.8 F (36.6 C) (Oral)   Resp 18   SpO2 98%   Physical Exam  Constitutional: She is oriented to person, place, and time. She appears well-developed and well-nourished.  HENT:  Head: Normocephalic and atraumatic.  Eyes: EOM are normal. Pupils are equal, round, and reactive to light. Right eye exhibits no discharge. Left eye exhibits no discharge.  Neck: Normal range of motion. Neck supple.  Cardiovascular: Normal rate and regular rhythm.   No murmur heard. Pulmonary/Chest: Effort normal and breath sounds normal. She has no wheezes. She has no rales.  Abdominal: Soft. She exhibits no distension. There is no tenderness.  Neurological: She is oriented to person, place, and time.  Skin: Skin is warm and dry. She is not diaphoretic.  Psychiatric: She has a normal mood and affect.  Nursing note and vitals reviewed.    ED Treatments / Results  Labs (all labs ordered are listed, but only abnormal results are displayed) Labs Reviewed  [place], ED    EKG  EKG Interpretation  Date/Time:  Saturday February 26 2016 12:13:46 EDT Ventricular Rate:  85 PR Interval:  128 QRS Duration: 128 QT Interval:  400 QTC Calculation: 476 R Axis:   64 Text Interpretation:  Normal sinus rhythm Right bundle branch block Abnormal ECG new RBBB compared to old. no STEMI Confirmed by Johnney Killian, MD, Jeannie Done [phone]) on 02/26/2016 12:19:57 PM         Radiology No results found.  Procedures Procedures (including critical care time)  Medications Ordered in ED Medications - No data to display   Initial Impression / Assessment and Plan / ED Course  I have reviewed the triage vital signs and the nursing notes.  Pertinent labs & imaging results that were available during my care of the patient were reviewed by me and considered in my medical decision making (see chart for details).  Clinical Course    Patient is a 70 year old female presenting with chest pain. Patient had substernal chest heaviness that radiated to the right side of her head. Patient has had residual right neck and jaw pain. Worse with movement. Patient's pain to palpation in the muscle belly. Ultimately I think that this is a muscle  strain in the right neck. However patient has a heart score that is very elevated,ho of hypertension hyperlipidemia diabetes current smoker and has never been risk stratified.    2:52 PM Patient changed her mind. She does not want to be admitted to hospital she has her grandsons birthday party tomorrow. I think this is a reasonable decision given the fact that she has had the resolution of the chest pain since roughly Wednesday. We discussed the risks of discharge home including disability and death. Patient is fine and currently knows risks so we will do a delta troponin discharge patient home if negative. Patient's daughter is in the room and agrees with plan. We will give her follow up with cardiology promptly.  Final Clinical Impressions(s) / ED Diagnoses   Final diagnoses:  None    New Prescriptions New Prescriptions   No medications on file     Pervis Macintyre Julio Alm, MD 02/26/16 1625

## 2016-02-26 NOTE — ED Triage Notes (Addendum)
Pt presents with c/o cp. The pain began 3 days ago and was generalized across her entire chest, radiating up into her R neck, jaw, and head. She also felt a choking sensation at the time. The pain has been constant since onset and became worse yesterday. She was unable to sleep last night due to the pain. She tried to apply pressure to the painful areas and took ibuprofen with no relief. The pain is worse with movement, bending forward. She reports dizziness, dry cough, nosebleeds, SOB, nausea. She denies fevers, syncope. She is currently being evaluated by doctor at [facility] for lung cancer. She is alert and breathing easily now.

## 2016-02-26 NOTE — ED Notes (Signed)
Pt ambulated to restroom. Pt tolerated well.  

## 2016-03-29 ENCOUNTER — Encounter: Payer: Self-pay | Admitting: Nurse Practitioner

## 2016-04-07 ENCOUNTER — Other Ambulatory Visit (INDEPENDENT_AMBULATORY_CARE_PROVIDER_SITE_OTHER): Payer: Medicare Other

## 2016-04-07 ENCOUNTER — Ambulatory Visit (INDEPENDENT_AMBULATORY_CARE_PROVIDER_SITE_OTHER): Payer: Medicare Other | Admitting: Nurse Practitioner

## 2016-04-07 ENCOUNTER — Encounter: Payer: Self-pay | Admitting: Nurse Practitioner

## 2016-04-07 VITALS — BP 130/72 | HR 78 | Ht 64.0 in | Wt 262.0 lb

## 2016-04-07 DIAGNOSIS — R634 Abnormal weight loss: Secondary | ICD-10-CM

## 2016-04-07 DIAGNOSIS — R1013 Epigastric pain: Secondary | ICD-10-CM

## 2016-04-07 DIAGNOSIS — Z86010 Personal history of colonic polyps: Secondary | ICD-10-CM | POA: Diagnosis not present

## 2016-04-07 LAB — COMPREHENSIVE METABOLIC PANEL
ALT: 12 U/L (ref 0–35)
AST: 11 U/L (ref 0–37)
Albumin: 4.1 g/dL (ref 3.5–5.2)
Alkaline Phosphatase: 77 U/L (ref 39–117)
BILIRUBIN TOTAL: 0.3 mg/dL (ref 0.2–1.2)
BUN: 14 mg/dL (ref 6–23)
CO2: 27 meq/L (ref 19–32)
Calcium: 9.2 mg/dL (ref 8.4–10.5)
Chloride: 104 mEq/L (ref 96–112)
Creatinine, Ser: 0.65 mg/dL (ref 0.40–1.20)
GFR: 96.04 mL/min (ref >60.00)
GLUCOSE: 222 mg/dL — AB (ref 70–99)
Potassium: 4.2 mEq/L (ref 3.5–5.1)
SODIUM: 139 meq/L (ref 135–145)
TOTAL PROTEIN: 7.2 g/dL (ref 6.0–8.3)

## 2016-04-07 LAB — LIPASE: Lipase: 17 U/L (ref 11.0–59.0)

## 2016-04-07 MED ORDER — NA SULFATE-K SULFATE-MG SULF 17.5-3.13-1.6 GM/177ML PO SOLN: ORAL | 0 refills | Status: DC

## 2016-04-07 NOTE — Progress Notes (Signed)
HPI:  Patient is a 70 year old female known to Dr. Fuller Plan. She had a colonoscopy February 2009 with findings of diverticulosis, internal hemorrhoids and adenomatous colon polyps. She is status post sigmoid colectomy with diverting loop ileostomy for diverticulitis followed by closure of loop ileostomy in 2012. No follow-up colonoscopies have been done.  Patient has had terrible epigastric pain radiating through to her back for several weeks. Pain is constant, much worse with food. The pain interrupts sleep. She vomited once last week but otherwise no associated vomiting. Her bowel movements are fine. States weight is down approximately 10 pounds recently and she tells [place] because of the upper abdominal pain. As far as NSAIDs, patient very seldom takes an ibuprofen. She has had a cholecystectomy. Patient on chronic PPI and has no GERD symptoms. Over the last 6 months she has experienced some intermittent dysphagia to both solids and liquids.  Last year patient told she had a pulmonary nodule and a follow-up chest CT in June of this year showed numerous nodules in right lung. Patient made an appointment with [facility], repeat CT scan showed slight improvement in pulmonary findings compared to June 2017 . Patient has follow-up appointment there with Dr. Elenor Quinones in March    Past Medical History:  Diagnosis Date  . Bleeding nose   . Diabetes mellitus   . Diverticulitis   . Hypertension   . Lung nodule 2017   Both lungs-pt at [facility].   . Parkinson disease ([place]) 2010  . Tubular adenoma of colon 05/2007     Past Surgical History:  Procedure Laterality Date  . APPENDECTOMY    . CHOLECYSTECTOMY    . COLON SURGERY     colectomy and reversal after diverticular ds  . TONSILLECTOMY    . TOTAL KNEE ARTHROPLASTY     right   Family History  Problem Relation Age of Onset  . Diabetes Mellitus II Sister   . Diabetes Mellitus II Son   . Colon cancer Neg Hx    Social History  Substance Use  Topics  . Smoking status: Former Smoker    Quit date: 03/20/1987  . Smokeless tobacco: Never Used  . Alcohol use No   Current Outpatient Prescriptions  Medication Sig Dispense Refill  . ALPRAZolam (XANAX) 1 MG tablet Take 1 mg by mouth 3 (three) times daily.      Marland Kitchen amLODipine (NORVASC) 10 MG tablet Take 10 mg by mouth daily.    . carbidopa-levodopa (SINEMET CR) 50-200 MG per tablet Take 1 tablet by mouth daily.      Marland Kitchen DEXILANT 60 MG capsule Take 60 mg by mouth daily. For acid reflux    . Fluticasone-Salmeterol (ADVAIR) 500-50 MCG/DOSE AEPB Inhale 1 puff into the lungs every 12 (twelve) hours.      . furosemide (LASIX) 20 MG tablet Take 20 mg by mouth daily as needed for fluid.     Marland Kitchen insulin glargine (LANTUS) 100 UNIT/ML injection Inject 150 Units into the skin at bedtime.     Marland Kitchen oxyCODONE-acetaminophen (PERCOCET) 10-325 MG per tablet Take 1 tablet by mouth every 6 (six) hours as needed for pain.     Marland Kitchen PROAIR HFA 108 (90 BASE) MCG/ACT inhaler Inhale 2 puffs into the lungs every 4 (four) hours as needed for wheezing or shortness of breath.     . sertraline (ZOLOFT) 100 MG tablet Take 100 mg by mouth daily.    . simvastatin (ZOCOR) 20 MG tablet Take 20 mg by mouth  at bedtime.      . ciprofloxacin (CIPRO) 500 MG tablet Take 1 tablet (500 mg total) by mouth 2 (two) times daily. (Patient not taking: Reported on 04/07/2016) 6 tablet 0  . cyclobenzaprine (FLEXERIL) 10 MG tablet Take 1 tablet (10 mg total) by mouth 2 (two) times daily as needed for muscle spasms. (Patient not taking: Reported on 04/07/2016) 10 tablet 0  . metroNIDAZOLE (FLAGYL) 500 MG tablet Take 1 tablet (500 mg total) by mouth 3 (three) times daily. (Patient not taking: Reported on 04/07/2016) 9 tablet 0   No current facility-administered medications for this visit.    Allergies  Allergen Reactions  . Morphine And Related Nausea And Vomiting and Other (See Comments)    headache     Review of Systems: Multiple positives  including allergy, sinus trouble, arthritis, back pain, vision changes, cough, depression, fatigue, muscle pain and cramps, nosebleeds, shortness of breath, sleeping polyps, swelling of feet and legs, excessive thirst or voice changes. All systems other reviewed and negative except where noted in HPI.    Physical Exam: BP 130/72   Pulse 78   Ht '5\' 4"'$  (1.626 m)   Wt 262 lb (118.8 kg)   BMI 44.97 kg/m  Constitutional:  Obese white female in no acute distress. Psychiatric: Normal mood and affect. Behavior is normal. HEENT: Normocephalic and atraumatic. Conjunctivae are normal. No scleral icterus. Neck supple.  Cardiovascular: Normal rate, regular rhythm.  Pulmonary/chest: Effort normal and breath sounds normal. No wheezing, rales or rhonchi. Abdominal: Soft, nondistended, moderate epigastric tenderness.  Bowel sounds active throughout. There are no masses palpable. No hepatomegaly. Extremities: no edema Lymphadenopathy: No cervical adenopathy noted. Neurological: Alert and oriented to person place and time. Skin: Skin is warm and dry. No rashes noted.   ASSESSMENT AND PLAN:  78. 70 year old female with several week history of epigastric pain radiating through to the back, worse with meals. No labs since early November at which time a CBC was normal. No recent LFTs for review. Patient tells me PCP is not done any imaging studies of the upper abdomen recently .  -Check LFTs, lipase today  -She reports recent weight loss. Patient needs surveillance colonoscopy but given significant epigastric pain and weight loss, will proceed with EGD first. We can actually get the procedure done next week The risks and benefits of EGD were discussed and the patient agrees to proceed.   2. Intermittent dysphagia, mainly to solids but sometimes liquids we dysmotility. Further evaluation at time of EGD   3. Adenomatous colon polyps 2009. She has not had a surveillance colonoscopy . Will go ahead and get  patient scheduled for a colonoscopy but EGD will be done first.  The risks and benefits of a colonoscopy with possible polypectomy were discussed and the patient agrees to proceed.   4. Diverticular disease.  Patient is s/p sigmoid colectomy in 2012 diverticulitis.  5. Pulmonary nodules, new. Followed by Wynona Dove, NP  04/07/2016, 10:43 AM   Cc:  Octavio Graves, DO

## 2016-04-07 NOTE — Patient Instructions (Signed)
  You have been scheduled for an endoscopy. Please follow written instructions given to you at your visit today. If you use inhalers (even only as needed), please bring them with you on the day of your procedure.   Your physician has requested that you go to the basement for the following lab work before leaving today: CMET, Lipase   You have been scheduled for a colonoscopy. Please follow written instructions given to you at your visit today.  Please pick up your prep supplies at the pharmacy within the next 1-3 days. If you use inhalers (even only as needed), please bring them with you on the day of your procedure.   I appreciate the opportunity to care for you.

## 2016-04-10 ENCOUNTER — Encounter: Payer: Self-pay | Admitting: Gastroenterology

## 2016-04-10 ENCOUNTER — Ambulatory Visit (AMBULATORY_SURGERY_CENTER): Payer: Medicare Other | Admitting: Gastroenterology

## 2016-04-10 VITALS — BP 127/64 | HR 73 | Temp 98.2°F | Resp 14 | Ht 64.0 in | Wt 262.0 lb

## 2016-04-10 DIAGNOSIS — R634 Abnormal weight loss: Secondary | ICD-10-CM

## 2016-04-10 DIAGNOSIS — K317 Polyp of stomach and duodenum: Secondary | ICD-10-CM | POA: Diagnosis not present

## 2016-04-10 DIAGNOSIS — R1013 Epigastric pain: Secondary | ICD-10-CM | POA: Diagnosis present

## 2016-04-10 DIAGNOSIS — K2950 Unspecified chronic gastritis without bleeding: Secondary | ICD-10-CM | POA: Diagnosis not present

## 2016-04-10 DIAGNOSIS — R1314 Dysphagia, pharyngoesophageal phase: Secondary | ICD-10-CM

## 2016-04-10 LAB — GLUCOSE, CAPILLARY
GLUCOSE-CAPILLARY: 195 mg/dL — AB (ref 65–99)
Glucose-Capillary: 194 mg/dL — ABNORMAL HIGH (ref 65–99)

## 2016-04-10 MED ORDER — SODIUM CHLORIDE 0.9 % IV SOLN: 500.0000 mL | INTRAVENOUS | Status: DC

## 2016-04-10 NOTE — Progress Notes (Signed)
Called to room to assist during endoscopic procedure.  Patient ID and intended procedure confirmed with present staff. Received instructions for my participation in the procedure from the performing physician.  

## 2016-04-10 NOTE — Progress Notes (Signed)
Report to PACU, RN, vss, BBS= Clear.  

## 2016-04-10 NOTE — Progress Notes (Signed)
Reviewed and agree with management plan.  Leathie Weich T. Kadience Macchi, MD FACG 

## 2016-04-10 NOTE — Patient Instructions (Signed)
YOU HAD AN ENDOSCOPIC PROCEDURE TODAY AT [facility]:   Refer to the procedure report that was given to you for any specific questions about what was found during the examination.  If the procedure report does not answer your questions, please call your gastroenterologist to clarify.  If you requested that your care partner not be given the details of your procedure findings, then the procedure report has been included in a sealed envelope for you to review at your convenience later.  YOU SHOULD EXPECT: Some feelings of bloating in the abdomen. Passage of more gas than usual.  Walking can help get rid of the air that was put into your GI tract during the procedure and reduce the bloating. If you had a lower endoscopy (such as a colonoscopy or flexible sigmoidoscopy) you may notice spotting of blood in your stool or on the toilet paper. If you underwent a bowel prep for your procedure, you may not have a normal bowel movement for a few days.  Please Note:  You might notice some irritation and congestion in your nose or some drainage.  This is from the oxygen used during your procedure.  There is no need for concern and it should clear up in a day or so.  SYMPTOMS TO REPORT IMMEDIATELY:    Following upper endoscopy (EGD)  Vomiting of blood or coffee ground material  New chest pain or pain under the shoulder blades  Painful or persistently difficult swallowing  New shortness of breath  Fever of 100F or higher  Black, tarry-looking stools  For urgent or emergent issues, a gastroenterologist can be reached at any hour by calling [phone].   DIET:  Follow Diet Handout.   ACTIVITY:  You should plan to take it easy for the rest of today and you should NOT DRIVE or use heavy machinery until tomorrow (because of the sedation medicines used during the test).    FOLLOW UP: Our staff will call the number listed on your records the next business day following your procedure to  check on you and address any questions or concerns that you may have regarding the information given to you following your procedure. If we do not reach you, we will leave a message.  However, if you are feeling well and you are not experiencing any problems, there is no need to return our call.  We will assume that you have returned to your regular daily activities without incident.  If any biopsies were taken you will be contacted by phone or by letter within the next 1-3 weeks.  Please call us at [phone] if you have not heard about the biopsies in 3 weeks.    SIGNATURES/CONFIDENTIALITY: You and/or your care partner have signed paperwork which will be entered into your electronic medical record.  These signatures attest to the fact that that the information above on your After Visit Summary has been reviewed and is understood.  Full responsibility of the confidentiality of this discharge information lies with you and/or your care-partner.   Resume medications. Office will contact you with CT appointment. Information given on Dilation Diet. Contrast given for CT Scan.

## 2016-04-10 NOTE — Op Note (Signed)
[facility] Patient Name: [name] Procedure Date: 04/10/2016 8:56 AM MRN: [id number] Endoscopist: Ladene Artist , MD Age: 70 Referring MD:  Date of Birth: March 31, 1946 Gender: Female Account #: [id number] Procedure:                Upper GI endoscopy Indications:              Epigastric abdominal pain, Dysphagia, Weight loss Medicines:                Monitored Anesthesia Care Procedure:                Pre-Anesthesia Assessment:                           - Prior to the procedure, a History and Physical                            was performed, and patient medications and                            allergies were reviewed. The patient's tolerance of                            previous anesthesia was also reviewed. The risks                            and benefits of the procedure and the sedation                            options and risks were discussed with the patient.                            All questions were answered, and informed consent                            was obtained. Prior Anticoagulants: The patient has                            taken no previous anticoagulant or antiplatelet                            agents. ASA Grade Assessment: III - A patient with                            severe systemic disease. After reviewing the risks                            and benefits, the patient was deemed in                            satisfactory condition to undergo the procedure.                           After obtaining informed consent, the endoscope was  passed under direct vision. Throughout the                            procedure, the patient's blood pressure, pulse, and                            oxygen saturations were monitored continuously. The                            Model GIF-[id number]) scope was introduced                            through the mouth, and advanced to the second part       of duodenum. The upper GI endoscopy was                            accomplished without difficulty. The patient                            tolerated the procedure well. Scope In: Scope Out: Findings:                 No endoscopic abnormality was evident in the                            esophagus to explain the patient's complaint of                            dysphagia. It was decided, however, to proceed with                            dilation of the entire esophagus. A guidewire was                            placed and the scope was withdrawn. Dilation was                            performed with a Savary dilator with no resistance                            at 15 mm.                           Patchy mildly erythematous mucosa without bleeding                            was found in the gastric body. Biopsies were taken                            with a cold forceps for histology.                           A single 5 mm sessile polyp with no bleeding and no  stigmata of recent bleeding was found on the lesser                            curvature of the gastric body. The polyp was                            removed with a cold biopsy forceps. Resection and                            retrieval were complete.                           The exam of the stomach was otherwise normal.                           The duodenal bulb and second portion of the                            duodenum were normal. Complications:            No immediate complications. Estimated blood loss:                            None. Estimated Blood Loss:     Estimated blood loss was minimal. Impression:               - No endoscopic esophageal abnormality to explain                            patient's dysphagia. Esophagus dilated. Dilated.                           - Erythematous mucosa in the gastric body. Biopsied.                           - A single gastric polyp. Resected and  retrieved.                           - Normal duodenal bulb and second portion of the                            duodenum. Recommendation:           - Patient has a contact number available for                            emergencies. The signs and symptoms of potential                            delayed complications were discussed with the                            patient. Return to normal activities tomorrow.                            Written discharge instructions  were provided to the                            patient.                           - Clear liquid diet for 2 hours, then advance as                            tolerated to soft diet today. Resume prior diet                            tomorrow.                           - Continue present medications.                           - Await pathology results.                           - Perform a CT scan (computed tomography) of                            abdomen with contrast and pelvis with contrast at                            the next available appointment. Ladene Artist, MD 04/10/2016 9:14:49 AM This report has been signed electronically.

## 2016-04-11 ENCOUNTER — Telehealth: Payer: Self-pay

## 2016-04-11 DIAGNOSIS — R1013 Epigastric pain: Principal | ICD-10-CM

## 2016-04-11 DIAGNOSIS — G8929 Other chronic pain: Secondary | ICD-10-CM

## 2016-04-11 DIAGNOSIS — R634 Abnormal weight loss: Secondary | ICD-10-CM

## 2016-04-11 DIAGNOSIS — R1319 Other dysphagia: Secondary | ICD-10-CM

## 2016-04-11 NOTE — Telephone Encounter (Signed)
Patient is scheduled for CT abdomen and pelvis on 04/14/16 3:00 at [facility].  She is notified of the details and verbalized understanding of instructions.

## 2016-04-11 NOTE — Telephone Encounter (Signed)
  Follow up Call-  Call back number 04/10/2016  Post procedure Call Back phone  # [phone]  Permission to leave phone message Yes  Some recent data might be hidden     Patient questions:  Do you have a fever, pain , or abdominal swelling? No. Pain Score  0 *  Have you tolerated food without any problems? Yes.    Have you been able to return to your normal activities? Yes.    Do you have any questions about your discharge instructions: Diet   No. Medications  No. Follow up visit  No.  Do you have questions or concerns about your Care? No.  Actions: * If pain score is 4 or above: No action needed, pain <4.

## 2016-04-11 NOTE — Telephone Encounter (Signed)
  Follow up Call-  Call back number 04/10/2016  Post procedure Call Back phone  # [phone]  Permission to leave phone message Yes  Some recent data might be hidden    Patient was called for follow up after her procedure on 04/10/2016. No answer at the number given for follow up phone call. A message was left on the answering machine.

## 2016-04-14 ENCOUNTER — Ambulatory Visit (INDEPENDENT_AMBULATORY_CARE_PROVIDER_SITE_OTHER)
Admission: RE | Admit: 2016-04-14 | Discharge: 2016-04-14 | Disposition: A | Discharge status: Home / Self Care | Payer: Medicare Other | Source: Ambulatory Visit | Attending: Gastroenterology | Admitting: Gastroenterology

## 2016-04-14 DIAGNOSIS — R634 Abnormal weight loss: Secondary | ICD-10-CM

## 2016-04-14 DIAGNOSIS — R1013 Epigastric pain: Secondary | ICD-10-CM | POA: Diagnosis not present

## 2016-04-14 DIAGNOSIS — R1319 Other dysphagia: Secondary | ICD-10-CM

## 2016-04-14 DIAGNOSIS — G8929 Other chronic pain: Secondary | ICD-10-CM

## 2016-04-14 MED ORDER — IOPAMIDOL (ISOVUE-300) INJECTION 61%
100.0000 mL | Freq: Once | INTRAVENOUS | Status: AC | PRN
  Administered 2016-04-14: 100 mL via INTRAVENOUS

## 2016-04-18 ENCOUNTER — Telehealth: Payer: Self-pay | Admitting: Gastroenterology

## 2016-04-18 ENCOUNTER — Other Ambulatory Visit: Payer: Self-pay

## 2016-04-18 DIAGNOSIS — R599 Enlarged lymph nodes, unspecified: Secondary | ICD-10-CM

## 2016-04-18 DIAGNOSIS — R591 Generalized enlarged lymph nodes: Secondary | ICD-10-CM

## 2016-04-18 NOTE — Telephone Encounter (Signed)
See CT results for additional details

## 2016-04-20 ENCOUNTER — Other Ambulatory Visit: Payer: Self-pay

## 2016-04-20 ENCOUNTER — Inpatient Hospital Stay: Admission: RE | Admit: 2016-04-20 | Payer: Medicare Other | Source: Ambulatory Visit

## 2016-04-20 ENCOUNTER — Telehealth: Payer: Self-pay | Admitting: Gastroenterology

## 2016-04-20 MED ORDER — BIS SUBCIT-METRONID-TETRACYC 140-125-125 MG PO CAPS: 3.0000 | ORAL_CAPSULE | Freq: Three times a day (TID) | ORAL | 0 refills | Status: DC

## 2016-04-20 MED ORDER — OMEPRAZOLE 40 MG PO CPDR: 40.0000 mg | DELAYED_RELEASE_CAPSULE | Freq: Two times a day (BID) | ORAL | 0 refills | Status: DC

## 2016-04-20 NOTE — Telephone Encounter (Signed)
Informed patient that I left samples of the 14 day treatment at the front desk if patient can pick it up. Patient states she can come by today. Informed patient to take 3 capsules by mouth four times a day along with the omeprazole she picked up from the pharmacy. Patient states she understands and did pick up the omeprazole already.

## 2016-05-01 ENCOUNTER — Other Ambulatory Visit: Payer: Self-pay | Admitting: Gastroenterology

## 2016-05-06 ENCOUNTER — Emergency Department (HOSPITAL_COMMUNITY): Payer: Medicare Other

## 2016-05-06 ENCOUNTER — Emergency Department (HOSPITAL_COMMUNITY)
Admission: EM | Admit: 2016-05-06 | Discharge: 2016-05-06 | Disposition: A | Discharge status: Home / Self Care | Payer: Medicare Other | Attending: Emergency Medicine | Admitting: Emergency Medicine

## 2016-05-06 ENCOUNTER — Encounter (HOSPITAL_COMMUNITY): Payer: Self-pay | Admitting: Emergency Medicine

## 2016-05-06 DIAGNOSIS — I10 Essential (primary) hypertension: Secondary | ICD-10-CM | POA: Insufficient documentation

## 2016-05-06 DIAGNOSIS — G20 Parkinson's disease: Secondary | ICD-10-CM | POA: Insufficient documentation

## 2016-05-06 DIAGNOSIS — Z794 Long term (current) use of insulin: Secondary | ICD-10-CM | POA: Diagnosis not present

## 2016-05-06 DIAGNOSIS — R918 Other nonspecific abnormal finding of lung field: Secondary | ICD-10-CM | POA: Insufficient documentation

## 2016-05-06 DIAGNOSIS — J45909 Unspecified asthma, uncomplicated: Secondary | ICD-10-CM | POA: Insufficient documentation

## 2016-05-06 DIAGNOSIS — Z87891 Personal history of nicotine dependence: Secondary | ICD-10-CM | POA: Insufficient documentation

## 2016-05-06 DIAGNOSIS — E119 Type 2 diabetes mellitus without complications: Secondary | ICD-10-CM | POA: Insufficient documentation

## 2016-05-06 DIAGNOSIS — I313 Pericardial effusion (noninflammatory): Secondary | ICD-10-CM | POA: Insufficient documentation

## 2016-05-06 DIAGNOSIS — Z96651 Presence of right artificial knee joint: Secondary | ICD-10-CM | POA: Diagnosis not present

## 2016-05-06 DIAGNOSIS — Z79899 Other long term (current) drug therapy: Secondary | ICD-10-CM | POA: Insufficient documentation

## 2016-05-06 DIAGNOSIS — R0781 Pleurodynia: Secondary | ICD-10-CM

## 2016-05-06 HISTORY — DX: Malignant (primary) neoplasm, unspecified: C80.1

## 2016-05-06 HISTORY — DX: Unspecified asthma, uncomplicated: J45.909

## 2016-05-06 LAB — CBC
HEMATOCRIT: 39.3 % (ref 36.0–46.0)
Hemoglobin: 12.9 g/dL (ref 12.0–15.0)
MCH: 27.9 pg (ref 26.0–34.0)
MCHC: 32.8 g/dL (ref 30.0–36.0)
MCV: 85.1 fL (ref 78.0–100.0)
Platelets: 239 10*3/uL (ref 150–400)
RBC: 4.62 MIL/uL (ref 3.87–5.11)
RDW: 14.1 % (ref 11.5–15.5)
WBC: 7.6 10*3/uL (ref 4.0–10.5)

## 2016-05-06 LAB — BASIC METABOLIC PANEL
Anion gap: 10 (ref 5–15)
BUN: 10 mg/dL (ref 6–20)
CHLORIDE: 105 mmol/L (ref 101–111)
CO2: 24 mmol/L (ref 22–32)
CREATININE: 0.72 mg/dL (ref 0.44–1.00)
Calcium: 9.3 mg/dL (ref 8.9–10.3)
GFR calc Af Amer: >60 mL/min (ref >60)
GFR calc non Af Amer: >60 mL/min (ref >60)
GLUCOSE: 264 mg/dL — AB (ref 65–99)
POTASSIUM: 4.6 mmol/L (ref 3.5–5.1)
Sodium: 139 mmol/L (ref 135–145)

## 2016-05-06 LAB — I-STAT TROPONIN, ED: Troponin i, poc: 0 ng/mL (ref 0.00–0.08)

## 2016-05-06 MED ORDER — IOPAMIDOL (ISOVUE-370) INJECTION 76%
INTRAVENOUS | Status: AC
  Administered 2016-05-06: 100 mL
  Filled 2016-05-06: qty 100

## 2016-05-06 MED ORDER — METOCLOPRAMIDE HCL 5 MG/ML IJ SOLN
5.0000 mg | Freq: Once | INTRAMUSCULAR | Status: AC
  Administered 2016-05-06: 5 mg via INTRAVENOUS
  Filled 2016-05-06: qty 2

## 2016-05-06 MED ORDER — HYDROMORPHONE HCL 2 MG/ML IJ SOLN
1.0000 mg | Freq: Once | INTRAMUSCULAR | Status: AC
  Administered 2016-05-06: 1 mg via INTRAVENOUS
  Filled 2016-05-06: qty 1

## 2016-05-06 NOTE — ED Notes (Signed)
BP cuff has slipped down over elbow, cuff repositioned and BP rechecked.

## 2016-05-06 NOTE — Discharge Instructions (Signed)
Call your pulmonologist (lung doctor) at [facility] next week to tell him he had a CT scan of your chest performed here. Take the CD copy of the CT scan performed today. There is concern that you may have lung cancer and may need a biopsy. Return if you develop difficulty breathing or concern for any reason. It is safe to take Percocet as directed for pain

## 2016-05-06 NOTE — ED Notes (Signed)
ED Provider at bedside. 

## 2016-05-06 NOTE — ED Triage Notes (Signed)
Pt. Stated, I saw Dr. Fuller Plan 2 weeks ago and he said I had a stomach bacteria and put me on 12 antibiotics.

## 2016-05-06 NOTE — ED Provider Notes (Signed)
[facility] [place] DEPT Provider Note   CSN: [id number] Arrival date & time: 05/06/16  1312     History   Chief Complaint Chief Complaint  Patient presents with  . Chest Pain  . Shortness of Breath    HPI [name] is a 71 y.o. female.Patient complains of left sided chest pain, pleuritic and worse with coughing  onset 2016. She states pain became worse 2 weeks ago. She denies any fever.She feels short of breath only when pain is exacerbated. She's been treating herself with Percocet 5 times a day without relief. Pain is improved with remaining still. She also has pain in her left breast. Pain radiates to left scapular area. No fever cough is nonproductive. No other associated symptoms.Pt was started on pylera approximately 2 weeks ago. She was told to take 12 doses per day she states she is only able to take 9 doses.  HPI  Past Medical History:  Diagnosis Date  . Bleeding nose   . Diabetes mellitus   . Diverticulitis   . Hypertension   . Lung nodule 2017   Both lungs-pt at [facility].   . Parkinson disease ([place]) 2010  . Tubular adenoma of colon 05/2007    Patient Active Problem List   Diagnosis Date Noted  . Chest pain 02/26/2016  . Colitis 03/02/2015  . Diabetes mellitus type 2, controlled ([place]) 03/02/2015  . Hyperlipidemia 03/02/2015  . OSA (obstructive sleep apnea) 05/10/2012  . Essential tremor 03/19/2012  . DIVERTICULITIS OF COLON 02/03/2008  . ANEMIA, IRON DEFICIENCY 12/27/2007  . HEMORRHOIDS, INTERNAL 12/27/2007  . GERD 12/27/2007  . GASTRITIS 12/27/2007  . DIVERTICULOSIS, COLON 12/27/2007  . PUD, HX OF 12/27/2007  . COLONIC POLYPS, HX OF 12/27/2007  . ANXIETY 04/23/2007  . Essential hypertension 04/23/2007  . ARTHRITIS 04/23/2007  . SLEEP APNEA 04/23/2007  . DM 04/25/1991  . Asthma 04/23/1987    Past Surgical History:  Procedure Laterality Date  . APPENDECTOMY    . CHOLECYSTECTOMY    . COLON SURGERY     colectomy and reversal after diverticular ds    . PARTIAL COLECTOMY    . TONSILLECTOMY    . TOTAL KNEE ARTHROPLASTY     right  . VAGINAL HYSTERECTOMY      OB History    No data available       Home Medications    Prior to Admission medications   Medication Sig Start Date End Date Taking? Authorizing Provider  ALPRAZolam Duanne Moron) 1 MG tablet Take 1 mg by mouth 3 (three) times daily.     Yes Historical Provider, MD  amLODipine (NORVASC) 10 MG tablet Take 10 mg by mouth daily.   Yes Historical Provider, MD  bismuth-metronidazole-tetracycline [facility]) [phone] MG capsule Take 3 capsules by mouth 4 (four) times daily -  before meals and at bedtime. Patient taking differently: Take 3 capsules by mouth 3 (three) times daily.  04/20/16  Yes Ladene Artist, MD  insulin glargine (LANTUS) 100 UNIT/ML injection Inject 150 Units into the skin at bedtime.    Yes Historical Provider, MD  carbidopa-levodopa (SINEMET CR) 50-200 MG per tablet Take 1 tablet by mouth daily.      Historical Provider, MD  cyclobenzaprine (FLEXERIL) 10 MG tablet Take 1 tablet (10 mg total) by mouth 2 (two) times daily as needed for muscle spasms. Patient not taking: Reported on 04/10/2016 02/26/16   Courteney Lyn Mackuen, MD  DEXILANT 60 MG capsule Take 60 mg by mouth daily. For acid reflux 06/24/13  Historical Provider, MD  Fluticasone-Salmeterol (ADVAIR) 500-50 MCG/DOSE AEPB Inhale 1 puff into the lungs every 12 (twelve) hours.      Historical Provider, MD  furosemide (LASIX) 20 MG tablet Take 20 mg by mouth daily as needed for fluid.  02/14/16   Historical Provider, MD  metroNIDAZOLE (FLAGYL) 500 MG tablet Take 1 tablet (500 mg total) by mouth 3 (three) times daily. Patient not taking: Reported on 04/10/2016 03/07/15   Thurnell Lose, MD  omeprazole (PRILOSEC) 40 MG capsule Take 1 capsule (40 mg total) by mouth 2 (two) times daily. 04/20/16   Ladene Artist, MD  oxyCODONE-acetaminophen (PERCOCET) 10-325 MG per tablet Take 1 tablet by mouth every 6 (six) hours as  needed for pain.  10/03/12   Historical Provider, MD  PROAIR HFA 108 (90 BASE) MCG/ACT inhaler Inhale 2 puffs into the lungs every 4 (four) hours as needed for wheezing or shortness of breath.  06/24/13   Historical Provider, MD  sertraline (ZOLOFT) 100 MG tablet Take 100 mg by mouth daily. 06/29/13   Historical Provider, MD  simvastatin (ZOCOR) 20 MG tablet Take 20 mg by mouth at bedtime.      Historical Provider, MD    Family History Family History  Problem Relation Age of Onset  . Diabetes Mellitus II Sister   . Diabetes Mellitus II Son   . Colon cancer Neg Hx     Social History Social History  Substance Use Topics  . Smoking status: Former Smoker    Quit date: 03/20/1987  . Smokeless tobacco: Never Used  . Alcohol use No     Allergies   Morphine and related and Tape   Review of Systems Review of Systems  Constitutional: Negative.   HENT: Negative.   Respiratory: Positive for cough and shortness of breath.   Cardiovascular: Positive for chest pain.  Gastrointestinal: Negative.   Endocrine:       Left breast pain  Musculoskeletal: Negative.   Skin: Negative.   Allergic/Immunologic: Positive for immunocompromised state.       Diabetic  Neurological: Negative.   Psychiatric/Behavioral: Negative.   All other systems reviewed and are negative.    Physical Exam Updated Vital Signs BP 113/62   Pulse 83   Temp 98.6 F (37 C) (Oral)   Resp 14   Ht '5\' 4"'$  (1.626 m)   Wt 260 lb (117.9 kg)   SpO2 97%   BMI 44.63 kg/m   Physical Exam  Constitutional: She appears well-developed and well-nourished. No distress.  HENT:  Head: Normocephalic and atraumatic.  Eyes: Conjunctivae are normal. Pupils are equal, round, and reactive to light.  Neck: Neck supple. No tracheal deviation present. No thyromegaly present.  Cardiovascular: Normal rate and regular rhythm.   No murmur heard. Pulmonary/Chest: Effort normal and breath sounds normal.  Abdominal: Soft. Bowel sounds are  normal. She exhibits no distension. There is no tenderness.  Obese  Musculoskeletal: Normal range of motion. She exhibits no edema or tenderness.  Neurological: She is alert. Coordination normal.  Skin: Skin is warm and dry. No rash noted.  Left breast without masses without tenderness. Nipple is normal. No axillary nodes  Psychiatric: She has a normal mood and affect.  Nursing note and vitals reviewed.    ED Treatments / Results  Labs (all labs ordered are listed, but only abnormal results are displayed) Labs Reviewed  BASIC METABOLIC PANEL - Abnormal; Notable for the following:       Result Value   Glucose,  Bld 264 (*)    All other components within normal limits  CBC  I-STAT TROPOININ, ED   Chest x-ray viewed by me EKG  EKG Interpretation  Date/Time:  Saturday May 06 2016 13:16:25 EST Ventricular Rate:  88 PR Interval:  126 QRS Duration: 128 QT Interval:  404 QTC Calculation: 488 R Axis:   69 Text Interpretation:  Normal sinus rhythm Right bundle branch block Abnormal ECG No significant change since last tracing Confirmed by Winfred Leeds  MD, Sriram Febles [phone]) on 05/06/2016 3:59:42 PM       Radiology Dg Chest 2 View  Result Date: 05/06/2016 CLINICAL DATA:  Pt c/o left sided cp that radiates under her left arm and into her left shoulder blade x 1-2 weeks. Pt states it is a sharp stabbing pain when she takes a deep breath in. Hx of hypertension, diabetes, and asthma. No hx of surgeries in heart or lungs. EXAM: CHEST  2 VIEW COMPARISON:  02/26/2016 FINDINGS: Cardiac silhouette is normal in size and configuration. Normal mediastinal and hilar contours. There are thickened interstitial markings, stable from the prior study. No lung consolidation. No evidence of edema. No pleural effusion or pneumothorax. Skeletal structures are demineralized but grossly intact. IMPRESSION: No acute cardiopulmonary disease. Electronically Signed   By: Lajean Manes M.D.   On: 05/06/2016 13:59     Procedures Procedures (including critical care time)  Medications Ordered in ED Medications  HYDROmorphone (DILAUDID) injection 1 mg (not administered)    Results for orders placed or performed during the hospital encounter of 75/64/33  Basic metabolic panel  Result Value Ref Range   Sodium 139 135 - 145 mmol/L   Potassium 4.6 3.5 - 5.1 mmol/L   Chloride 105 101 - 111 mmol/L   CO2 24 22 - 32 mmol/L   Glucose, Bld 264 (H) 65 - 99 mg/dL   BUN 10 6 - 20 mg/dL   Creatinine, Ser 0.72 0.44 - 1.00 mg/dL   Calcium 9.3 8.9 - 10.3 mg/dL   GFR calc non Af Amer >60 >60 mL/min   GFR calc Af Amer >60 >60 mL/min   Anion gap 10 5 - 15  CBC  Result Value Ref Range   WBC 7.6 4.0 - 10.5 K/uL   RBC 4.62 3.87 - 5.11 MIL/uL   Hemoglobin 12.9 12.0 - 15.0 g/dL   HCT 39.3 36.0 - 46.0 %   MCV 85.1 78.0 - 100.0 fL   MCH 27.9 26.0 - 34.0 pg   MCHC 32.8 30.0 - 36.0 g/dL   RDW 14.1 11.5 - 15.5 %   Platelets 239 150 - 400 K/uL  I-stat troponin, ED  Result Value Ref Range   Troponin i, poc 0.00 0.00 - 0.08 ng/mL   Comment 3           Dg Chest 2 View  Result Date: 05/06/2016 CLINICAL DATA:  Pt c/o left sided cp that radiates under her left arm and into her left shoulder blade x 1-2 weeks. Pt states it is a sharp stabbing pain when she takes a deep breath in. Hx of hypertension, diabetes, and asthma. No hx of surgeries in heart or lungs. EXAM: CHEST  2 VIEW COMPARISON:  02/26/2016 FINDINGS: Cardiac silhouette is normal in size and configuration. Normal mediastinal and hilar contours. There are thickened interstitial markings, stable from the prior study. No lung consolidation. No evidence of edema. No pleural effusion or pneumothorax. Skeletal structures are demineralized but grossly intact. IMPRESSION: No acute cardiopulmonary disease. Electronically Signed  By: Lajean Manes M.D.   On: 05/06/2016 13:59   Ct Angio Chest Pe W Or Wo Contrast  Result Date: 05/06/2016 CLINICAL DATA:  Chronic shortness  of breath and left-sided chest pain, extending to the left shoulder. Initial encounter. EXAM: CT ANGIOGRAPHY CHEST WITH CONTRAST TECHNIQUE: Multidetector CT imaging of the chest was performed using the standard protocol during bolus administration of intravenous contrast. Multiplanar CT image reconstructions and MIPs were obtained to evaluate the vascular anatomy. CONTRAST:  100 mL of Isovue 370 IV contrast COMPARISON:  Chest radiograph performed earlier today at 1:32 p.m., and CT of the chest performed 09/24/2015 FINDINGS: Cardiovascular: There is no evidence of pulmonary embolus. Scattered calcification is noted along the aortic arch. The great vessels are grossly unremarkable in appearance. The heart remains normal in size. Mediastinum/Nodes: Diffuse mediastinal lymphadenopathy is seen, with a large confluent nodal mass measuring approximately 5.7 x 3.9 cm at the subcarinal region, partially encasing the mainstem bronchi bilaterally. Additional enlarged mediastinal nodes measure up to 1.8 cm in short axis, at the right paratracheal, periaortic, aortopulmonary window and superior mediastinal regions. Enlarged bilateral hilar nodes are seen, more prominent on the right. A small pericardial effusion is noted. The thyroid gland is unremarkable in appearance. No axillary lymphadenopathy is appreciated. Lungs/Pleura: Trace right-sided pleural fluid is noted, with associated atelectasis. Scattered peripheral nodules are seen within the right lung, measuring up to 1.2 cm in size, likely reflecting metastatic disease. Scattered blebs are seen at the lung apices. No pneumothorax is seen. Upper Abdomen: The visualized portions of the liver and the spleen are unremarkable in appearance. The patient is status post cholecystectomy, with clips noted along the gallbladder fossa. The visualized portions of the pancreas, adrenal glands and left kidney are within normal limits. Musculoskeletal: No acute osseous abnormalities are  identified. The visualized musculature is unremarkable in appearance. Review of the MIP images confirms the above findings. IMPRESSION: 1. No evidence of pulmonary embolus. 2. Diffuse mediastinal lymphadenopathy. Large confluent nodal mass measuring 5.7 x 3.9 cm at the subcarinal region, partially encasing the mainstem bronchi bilaterally. Additional enlarged mediastinal nodes measure up to 1.8 cm in short axis. Enlarged bilateral hilar nodes, more prominent on the right. This raises concern for small cell lung cancer or possibly lymphoma. Other metastatic disease might have a similar appearance. Biopsy is recommended for further evaluation. 3. Small pericardial effusion seen. 4. Scattered peripheral nodules within the right lung, measuring up to 1.2 cm in size, concerning for metastatic disease. 5. Trace right-sided pleural fluid, with associated atelectasis. 6. Scattered blebs at the lung apices. Electronically Signed   By: Garald Balding M.D.   On: 05/06/2016 20:19   Ct Abdomen Pelvis W Contrast  Result Date: 04/14/2016 CLINICAL DATA:  Chronic epigastric pain with weight loss. Dysphagia. Parkinson's disease. EXAM: CT ABDOMEN AND PELVIS WITH CONTRAST TECHNIQUE: Multidetector CT imaging of the abdomen and pelvis was performed using the standard protocol following bolus administration of intravenous contrast. CONTRAST:  170m ISOVUE-300 IOPAMIDOL (ISOVUE-300) INJECTION 61% COMPARISON:  03/02/2015.  Chest CT 09/24/2015. FINDINGS: Lower chest: Clear lung bases. Normal heart size without pericardial or pleural effusion. A node within the azygoesophageal recess is enlarged at 12 mm on image 1/series 2. Compare 7 mm on 09/24/2015. Hepatobiliary: Hepatomegaly 19.6 cm craniocaudal. Suspect mild hepatic steatosis. Caudate and lateral segment left liver lobe enlargement. No focal liver lesion. Cholecystectomy, without biliary ductal dilatation. Pancreas: Normal, without mass or ductal dilatation. Spleen: Splenule  Adrenals/Urinary Tract: Normal right adrenal gland. Left adrenal  thickening and minimal nodularity are unchanged. Normal kidneys, without hydronephrosis. Normal urinary bladder. Stomach/Bowel: Normal stomach, without wall thickening. Surgical sutures in the sigmoid. Scattered colonic diverticula. Normal terminal ileum. Appendix is not visualized but there is no evidence of right lower quadrant inflammation. Small bowel intussusception on image 40/ series 2 is transient, not persistent on delayed images. Small bowel otherwise unremarkable. Vascular/Lymphatic: Aortic and branch vessel atherosclerosis. Prominent portal caval nodes are unchanged and likely reactive. Example at 1.5 cm on image 29/series 2. Reproductive: Hysterectomy.  No adnexal mass. Other: No significant free fluid. Ventral abdominal wall hernias x2 containing fat. Musculoskeletal: Thoracolumbar spondylosis. Convex left lumbar spine curvature. IMPRESSION: 1.  No acute process in the abdomen or pelvis. 2. Hepatomegaly and suspicion of mild hepatic steatosis. Hepatic morphology for which mild cirrhosis cannot be excluded. Correlate with risk factors. 3. Development of mild lower thoracic adenopathy. Followup was recommended on the chest CT of 09/24/2015. If this has not yet been performed, this should be considered. 4.  Aortic atherosclerosis. Electronically Signed   By: Abigail Miyamoto M.D.   On: 04/14/2016 16:01   Initial Impression / Assessment and Plan / ED Course  I have reviewed the triage vital signs and the nursing notes.  Pertinent labs & imaging results that were available during my care of the patient were reviewed by me and considered in my medical decision making (see chart for details).  Clinical Course   4:55 PM feels improved after treatment with intravenous hydromorphone. She requested antiemetic as prophylactic medicine for nausea. Reglan was administered simultaneously with hydromorphone with no adverse reaction.  8:35 PM  patient resting comfortably. No distress. Patient is stable for discharge. She is told to follow up with her pulmonologist at [facility]. She'll be furnished with a copy of her CT scan. Final Clinical Impressions(s) / ED Diagnoses  Diagnosis #1 pleuritic chest pain Final diagnoses:  None  #2 pericardial effusion #3 lung mass  New Prescriptions New Prescriptions   No medications on file     Orlie Dakin, MD 05/06/16 2041

## 2016-05-06 NOTE — ED Notes (Signed)
Pt understood dc material. NAD noted. 

## 2016-05-06 NOTE — ED Notes (Signed)
Made a hourly round but patient is in CT

## 2016-05-06 NOTE — ED Notes (Signed)
Care handoff to Tish Frederickson., RN

## 2016-05-06 NOTE — ED Notes (Signed)
PT placed on 2L via [place] at this time

## 2016-05-06 NOTE — ED Triage Notes (Signed)
Pt. Stated, I've been having chest pain when I try to take a deep breath. It goes to my back, shoulder and upper chest.  The pain is in my breast.

## 2016-05-06 NOTE — ED Notes (Signed)
Patient transported to CT 

## 2016-05-24 ENCOUNTER — Encounter: Payer: Self-pay | Admitting: Gastroenterology

## 2016-05-31 ENCOUNTER — Ambulatory Visit: Payer: Medicare Other | Admitting: Gastroenterology

## 2016-06-01 ENCOUNTER — Encounter: Payer: Medicare Other | Admitting: Gastroenterology

## 2016-06-05 ENCOUNTER — Telehealth: Payer: Self-pay | Admitting: Gastroenterology

## 2016-06-05 NOTE — Telephone Encounter (Signed)
OK No charge 

## 2016-06-07 ENCOUNTER — Encounter: Payer: Medicare Other | Admitting: Gastroenterology

## 2017-02-28 ENCOUNTER — Inpatient Hospital Stay (HOSPITAL_COMMUNITY): Payer: Medicare Other | Admitting: Certified Registered"

## 2017-02-28 ENCOUNTER — Emergency Department (HOSPITAL_COMMUNITY): Payer: Medicare Other

## 2017-02-28 ENCOUNTER — Encounter (HOSPITAL_COMMUNITY): Payer: Self-pay | Admitting: *Deleted

## 2017-02-28 ENCOUNTER — Encounter (HOSPITAL_COMMUNITY)
Admission: EM | Disposition: A | Discharge status: Home / Self Care | Payer: Self-pay | Source: Home / Self Care | Attending: Surgery

## 2017-02-28 ENCOUNTER — Inpatient Hospital Stay (HOSPITAL_COMMUNITY): Payer: Medicare Other

## 2017-02-28 ENCOUNTER — Inpatient Hospital Stay (HOSPITAL_COMMUNITY)
Admission: EM | Admit: 2017-02-28 | Discharge: 2017-03-05 | DRG: 271 | Disposition: A | Discharge status: Home / Self Care | Payer: Medicare Other | Attending: Surgery | Admitting: Surgery

## 2017-02-28 ENCOUNTER — Other Ambulatory Visit: Payer: Self-pay

## 2017-02-28 DIAGNOSIS — Z8711 Personal history of peptic ulcer disease: Secondary | ICD-10-CM

## 2017-02-28 DIAGNOSIS — G4733 Obstructive sleep apnea (adult) (pediatric): Secondary | ICD-10-CM | POA: Diagnosis present

## 2017-02-28 DIAGNOSIS — Z90710 Acquired absence of both cervix and uterus: Secondary | ICD-10-CM | POA: Diagnosis not present

## 2017-02-28 DIAGNOSIS — I454 Nonspecific intraventricular block: Secondary | ICD-10-CM | POA: Diagnosis present

## 2017-02-28 DIAGNOSIS — I4510 Unspecified right bundle-branch block: Secondary | ICD-10-CM | POA: Diagnosis present

## 2017-02-28 DIAGNOSIS — F05 Delirium due to known physiological condition: Secondary | ICD-10-CM | POA: Diagnosis present

## 2017-02-28 DIAGNOSIS — Z87891 Personal history of nicotine dependence: Secondary | ICD-10-CM | POA: Diagnosis not present

## 2017-02-28 DIAGNOSIS — Z96651 Presence of right artificial knee joint: Secondary | ICD-10-CM | POA: Diagnosis present

## 2017-02-28 DIAGNOSIS — Z794 Long term (current) use of insulin: Secondary | ICD-10-CM | POA: Diagnosis not present

## 2017-02-28 DIAGNOSIS — Z6841 Body Mass Index (BMI) 40.0 and over, adult: Secondary | ICD-10-CM | POA: Diagnosis not present

## 2017-02-28 DIAGNOSIS — C799 Secondary malignant neoplasm of unspecified site: Secondary | ICD-10-CM

## 2017-02-28 DIAGNOSIS — Z833 Family history of diabetes mellitus: Secondary | ICD-10-CM | POA: Diagnosis not present

## 2017-02-28 DIAGNOSIS — I10 Essential (primary) hypertension: Secondary | ICD-10-CM

## 2017-02-28 DIAGNOSIS — G20 Parkinson's disease: Secondary | ICD-10-CM | POA: Diagnosis present

## 2017-02-28 DIAGNOSIS — Z9049 Acquired absence of other specified parts of digestive tract: Secondary | ICD-10-CM | POA: Diagnosis not present

## 2017-02-28 DIAGNOSIS — I313 Pericardial effusion (noninflammatory): Secondary | ICD-10-CM | POA: Diagnosis not present

## 2017-02-28 DIAGNOSIS — E119 Type 2 diabetes mellitus without complications: Secondary | ICD-10-CM | POA: Diagnosis present

## 2017-02-28 DIAGNOSIS — I3139 Other pericardial effusion (noninflammatory): Secondary | ICD-10-CM

## 2017-02-28 DIAGNOSIS — Z923 Personal history of irradiation: Secondary | ICD-10-CM | POA: Diagnosis not present

## 2017-02-28 DIAGNOSIS — Z801 Family history of malignant neoplasm of trachea, bronchus and lung: Secondary | ICD-10-CM

## 2017-02-28 DIAGNOSIS — K219 Gastro-esophageal reflux disease without esophagitis: Secondary | ICD-10-CM | POA: Diagnosis present

## 2017-02-28 DIAGNOSIS — C7951 Secondary malignant neoplasm of bone: Secondary | ICD-10-CM | POA: Diagnosis present

## 2017-02-28 DIAGNOSIS — Z4682 Encounter for fitting and adjustment of non-vascular catheter: Secondary | ICD-10-CM

## 2017-02-28 DIAGNOSIS — C3490 Malignant neoplasm of unspecified part of unspecified bronchus or lung: Secondary | ICD-10-CM | POA: Diagnosis present

## 2017-02-28 DIAGNOSIS — Z79899 Other long term (current) drug therapy: Secondary | ICD-10-CM

## 2017-02-28 DIAGNOSIS — Z66 Do not resuscitate: Secondary | ICD-10-CM | POA: Diagnosis present

## 2017-02-28 DIAGNOSIS — E669 Obesity, unspecified: Secondary | ICD-10-CM | POA: Diagnosis present

## 2017-02-28 DIAGNOSIS — Z7951 Long term (current) use of inhaled steroids: Secondary | ICD-10-CM

## 2017-02-28 DIAGNOSIS — R0603 Acute respiratory distress: Secondary | ICD-10-CM | POA: Diagnosis present

## 2017-02-28 DIAGNOSIS — I314 Cardiac tamponade: Secondary | ICD-10-CM

## 2017-02-28 DIAGNOSIS — Z86010 Personal history of colonic polyps: Secondary | ICD-10-CM

## 2017-02-28 DIAGNOSIS — M7989 Other specified soft tissue disorders: Secondary | ICD-10-CM | POA: Diagnosis not present

## 2017-02-28 DIAGNOSIS — E785 Hyperlipidemia, unspecified: Secondary | ICD-10-CM | POA: Diagnosis present

## 2017-02-28 DIAGNOSIS — J910 Malignant pleural effusion: Secondary | ICD-10-CM | POA: Diagnosis not present

## 2017-02-28 DIAGNOSIS — Z91048 Other nonmedicinal substance allergy status: Secondary | ICD-10-CM

## 2017-02-28 DIAGNOSIS — Z79891 Long term (current) use of opiate analgesic: Secondary | ICD-10-CM | POA: Diagnosis not present

## 2017-02-28 DIAGNOSIS — C3411 Malignant neoplasm of upper lobe, right bronchus or lung: Secondary | ICD-10-CM

## 2017-02-28 DIAGNOSIS — Z885 Allergy status to narcotic agent status: Secondary | ICD-10-CM

## 2017-02-28 HISTORY — PX: PERICARDIAL WINDOW: SHX2213

## 2017-02-28 LAB — TYPE AND SCREEN
ABO/RH(D): B POS
ANTIBODY SCREEN: NEGATIVE

## 2017-02-28 LAB — HEPATIC FUNCTION PANEL
ALT: 37 U/L (ref 14–54)
AST: 54 U/L — ABNORMAL HIGH (ref 15–41)
Albumin: 3.1 g/dL — ABNORMAL LOW (ref 3.5–5.0)
Alkaline Phosphatase: 96 U/L (ref 38–126)
Bilirubin, Direct: 0.1 mg/dL (ref 0.1–0.5)
Indirect Bilirubin: 0.5 mg/dL (ref 0.3–0.9)
Total Bilirubin: 0.6 mg/dL (ref 0.3–1.2)
Total Protein: 6.9 g/dL (ref 6.5–8.1)

## 2017-02-28 LAB — CBC
HEMATOCRIT: 40.1 % (ref 36.0–46.0)
Hemoglobin: 12.7 g/dL (ref 12.0–15.0)
MCH: 26.5 pg (ref 26.0–34.0)
MCHC: 31.7 g/dL (ref 30.0–36.0)
MCV: 83.5 fL (ref 78.0–100.0)
PLATELETS: 302 10*3/uL (ref 150–400)
RBC: 4.8 MIL/uL (ref 3.87–5.11)
RDW: 14.5 % (ref 11.5–15.5)
WBC: 11 10*3/uL — ABNORMAL HIGH (ref 4.0–10.5)

## 2017-02-28 LAB — ECHOCARDIOGRAM COMPLETE
AOASC: 31 cm
FS: 39 % (ref 28–44)
IV/PV OW: 1
LA ID, A-P, ES: 23 mm
LA diam end sys: 23 mm
LADIAMINDEX: 1.05 cm/m2
LV PW d: 12 mm — AB (ref 0.6–1.1)
LVOT area: 2.54 cm2
LVOTD: 18 mm

## 2017-02-28 LAB — BASIC METABOLIC PANEL
Anion gap: 10 (ref 5–15)
BUN: 16 mg/dL (ref 6–20)
CHLORIDE: 105 mmol/L (ref 101–111)
CO2: 21 mmol/L — AB (ref 22–32)
CREATININE: 1.11 mg/dL — AB (ref 0.44–1.00)
Calcium: 8.7 mg/dL — ABNORMAL LOW (ref 8.9–10.3)
GFR calc non Af Amer: 49 mL/min — ABNORMAL LOW (ref >60)
GFR, EST AFRICAN AMERICAN: 57 mL/min — AB (ref >60)
Glucose, Bld: 221 mg/dL — ABNORMAL HIGH (ref 65–99)
POTASSIUM: 3.9 mmol/L (ref 3.5–5.1)
Sodium: 136 mmol/L (ref 135–145)

## 2017-02-28 LAB — GLUCOSE, CAPILLARY
GLUCOSE-CAPILLARY: 88 mg/dL (ref 65–99)
Glucose-Capillary: 134 mg/dL — ABNORMAL HIGH (ref 65–99)

## 2017-02-28 LAB — ABO/RH: ABO/RH(D): B POS

## 2017-02-28 LAB — I-STAT TROPONIN, ED: Troponin i, poc: 0.01 ng/mL (ref 0.00–0.08)

## 2017-02-28 LAB — I-STAT CG4 LACTIC ACID, ED
Lactic Acid, Venous: 2.12 mmol/L (ref 0.5–1.9)
Lactic Acid, Venous: 1.82 mmol/L (ref 0.5–1.9)

## 2017-02-28 LAB — BRAIN NATRIURETIC PEPTIDE: B Natriuretic Peptide: 104.3 pg/mL — ABNORMAL HIGH (ref 0.0–100.0)

## 2017-02-28 LAB — CBG MONITORING, ED: Glucose-Capillary: 222 mg/dL — ABNORMAL HIGH (ref 65–99)

## 2017-02-28 SURGERY — CREATION, PERICARDIAL WINDOW
Anesthesia: General

## 2017-02-28 MED ORDER — PHENYLEPHRINE 40 MCG/ML (10ML) SYRINGE FOR IV PUSH (FOR BLOOD PRESSURE SUPPORT)
PREFILLED_SYRINGE | INTRAVENOUS | Status: DC | PRN
  Administered 2017-02-28 (×2): 80 ug via INTRAVENOUS
  Administered 2017-02-28: 120 ug via INTRAVENOUS
  Administered 2017-02-28: 80 ug via INTRAVENOUS

## 2017-02-28 MED ORDER — SERTRALINE HCL 100 MG PO TABS
100.0000 mg | ORAL_TABLET | Freq: Every day | ORAL | Status: DC
  Administered 2017-02-28 – 2017-03-04 (×5): 100 mg via ORAL
  Filled 2017-02-28 (×5): qty 1

## 2017-02-28 MED ORDER — ROCURONIUM BROMIDE 100 MG/10ML IV SOLN
INTRAVENOUS | Status: DC | PRN
  Administered 2017-02-28: 50 mg via INTRAVENOUS

## 2017-02-28 MED ORDER — PROPOFOL 10 MG/ML IV BOLUS
INTRAVENOUS | Status: DC | PRN
  Administered 2017-02-28: 30 mg via INTRAVENOUS

## 2017-02-28 MED ORDER — LACTATED RINGERS IV SOLN
INTRAVENOUS | Status: DC | PRN
  Administered 2017-02-28 (×2): via INTRAVENOUS

## 2017-02-28 MED ORDER — DEXTROSE 5 % IV SOLN
INTRAVENOUS | Status: DC | PRN
  Administered 2017-02-28: 3 g via INTRAVENOUS

## 2017-02-28 MED ORDER — ALPRAZOLAM 0.5 MG PO TABS
1.0000 mg | ORAL_TABLET | Freq: Two times a day (BID) | ORAL | Status: DC | PRN
  Administered 2017-02-28 – 2017-03-04 (×4): 1 mg via ORAL
  Filled 2017-02-28 (×4): qty 2

## 2017-02-28 MED ORDER — ALBUTEROL SULFATE (2.5 MG/3ML) 0.083% IN NEBU: 2.5000 mg | INHALATION_SOLUTION | RESPIRATORY_TRACT | Status: DC | PRN

## 2017-02-28 MED ORDER — FENTANYL CITRATE (PF) 100 MCG/2ML IJ SOLN
25.0000 ug | INTRAMUSCULAR | Status: DC | PRN
  Administered 2017-02-28 (×2): 50 ug via INTRAVENOUS

## 2017-02-28 MED ORDER — SERTRALINE HCL 100 MG PO TABS: 100.0000 mg | ORAL_TABLET | Freq: Every day | ORAL | Status: DC

## 2017-02-28 MED ORDER — IOPAMIDOL (ISOVUE-370) INJECTION 76%
INTRAVENOUS | Status: AC
  Administered 2017-02-28: 100 mL
  Filled 2017-02-28: qty 100

## 2017-02-28 MED ORDER — IPRATROPIUM-ALBUTEROL 0.5-2.5 (3) MG/3ML IN SOLN
3.0000 mL | Freq: Once | RESPIRATORY_TRACT | Status: AC
  Administered 2017-02-28: 3 mL via RESPIRATORY_TRACT
  Filled 2017-02-28: qty 3

## 2017-02-28 MED ORDER — ACETAMINOPHEN 500 MG PO TABS
1000.0000 mg | ORAL_TABLET | Freq: Four times a day (QID) | ORAL | Status: DC
  Administered 2017-02-28 – 2017-03-05 (×15): 1000 mg via ORAL
  Filled 2017-02-28 (×15): qty 2

## 2017-02-28 MED ORDER — OXYCODONE HCL 5 MG PO TABS
20.0000 mg | ORAL_TABLET | ORAL | Status: DC | PRN
  Administered 2017-02-28 – 2017-03-05 (×9): 20 mg via ORAL
  Filled 2017-02-28 (×9): qty 4

## 2017-02-28 MED ORDER — 0.9 % SODIUM CHLORIDE (POUR BTL) OPTIME
TOPICAL | Status: DC | PRN
  Administered 2017-02-28: 2000 mL

## 2017-02-28 MED ORDER — PANTOPRAZOLE SODIUM 40 MG PO TBEC
40.0000 mg | DELAYED_RELEASE_TABLET | Freq: Every day | ORAL | Status: DC
  Administered 2017-03-01 – 2017-03-05 (×5): 40 mg via ORAL
  Filled 2017-02-28 (×5): qty 1

## 2017-02-28 MED ORDER — FENTANYL CITRATE (PF) 100 MCG/2ML IJ SOLN
25.0000 ug | INTRAMUSCULAR | Status: DC | PRN
  Administered 2017-02-28: 50 ug via INTRAVENOUS
  Filled 2017-02-28: qty 2

## 2017-02-28 MED ORDER — MOMETASONE FURO-FORMOTEROL FUM 200-5 MCG/ACT IN AERO
2.0000 | INHALATION_SPRAY | Freq: Two times a day (BID) | RESPIRATORY_TRACT | Status: DC
  Administered 2017-03-01 – 2017-03-05 (×9): 2 via RESPIRATORY_TRACT
  Filled 2017-02-28 (×2): qty 8.8

## 2017-02-28 MED ORDER — FENTANYL CITRATE (PF) 100 MCG/2ML IJ SOLN
50.0000 ug | Freq: Once | INTRAMUSCULAR | Status: AC
  Administered 2017-02-28: 50 ug via INTRAVENOUS
  Filled 2017-02-28: qty 2

## 2017-02-28 MED ORDER — PHENYLEPHRINE HCL 10 MG/ML IJ SOLN
INTRAVENOUS | Status: DC | PRN
  Administered 2017-02-28: 15 ug/min via INTRAVENOUS

## 2017-02-28 MED ORDER — FENTANYL CITRATE (PF) 100 MCG/2ML IJ SOLN
INTRAMUSCULAR | Status: DC | PRN
  Administered 2017-02-28 (×2): 100 ug via INTRAVENOUS

## 2017-02-28 MED ORDER — SENNOSIDES-DOCUSATE SODIUM 8.6-50 MG PO TABS
1.0000 | ORAL_TABLET | Freq: Every day | ORAL | Status: DC
  Administered 2017-02-28 – 2017-03-04 (×3): 1 via ORAL
  Filled 2017-02-28 (×4): qty 1

## 2017-02-28 MED ORDER — ONDANSETRON HCL 4 MG/2ML IJ SOLN: 4.0000 mg | Freq: Four times a day (QID) | INTRAMUSCULAR | Status: DC | PRN

## 2017-02-28 MED ORDER — ESMOLOL HCL 100 MG/10ML IV SOLN
INTRAVENOUS | Status: DC | PRN
  Administered 2017-02-28: 100 mg via INTRAVENOUS

## 2017-02-28 MED ORDER — POTASSIUM CHLORIDE 10 MEQ/50ML IV SOLN
10.0000 meq | Freq: Every day | INTRAVENOUS | Status: DC | PRN
  Administered 2017-03-01 (×3): 10 meq via INTRAVENOUS
  Filled 2017-02-28 (×4): qty 50

## 2017-02-28 MED ORDER — INSULIN ASPART 100 UNIT/ML ~~LOC~~ SOLN: 0.0000 [IU] | SUBCUTANEOUS | Status: DC

## 2017-02-28 MED ORDER — METOCLOPRAMIDE HCL 5 MG/ML IJ SOLN
10.0000 mg | Freq: Four times a day (QID) | INTRAMUSCULAR | Status: AC
  Administered 2017-02-28 – 2017-03-01 (×4): 10 mg via INTRAVENOUS
  Filled 2017-02-28 (×4): qty 2

## 2017-02-28 MED ORDER — MIDAZOLAM HCL 2 MG/2ML IJ SOLN
INTRAMUSCULAR | Status: AC
  Filled 2017-02-28: qty 2

## 2017-02-28 MED ORDER — FENTANYL CITRATE (PF) 100 MCG/2ML IJ SOLN
INTRAMUSCULAR | Status: AC
  Filled 2017-02-28: qty 2

## 2017-02-28 MED ORDER — ONDANSETRON HCL 4 MG/2ML IJ SOLN
INTRAMUSCULAR | Status: DC | PRN
  Administered 2017-02-28: 4 mg via INTRAVENOUS

## 2017-02-28 MED ORDER — SUGAMMADEX SODIUM 500 MG/5ML IV SOLN
INTRAVENOUS | Status: DC | PRN
  Administered 2017-02-28: 300 mg via INTRAVENOUS

## 2017-02-28 MED ORDER — SUCCINYLCHOLINE CHLORIDE 200 MG/10ML IV SOSY
PREFILLED_SYRINGE | INTRAVENOUS | Status: DC | PRN
  Administered 2017-02-28: 140 mg via INTRAVENOUS

## 2017-02-28 MED ORDER — FENTANYL CITRATE (PF) 250 MCG/5ML IJ SOLN
INTRAMUSCULAR | Status: AC
  Filled 2017-02-28: qty 5

## 2017-02-28 MED ORDER — ACETAMINOPHEN 160 MG/5ML PO SOLN
1000.0000 mg | Freq: Four times a day (QID) | ORAL | Status: DC
  Filled 2017-02-28: qty 40.6

## 2017-02-28 MED ORDER — CARBIDOPA-LEVODOPA 25-250 MG PO TABS
1.0000 | ORAL_TABLET | Freq: Every day | ORAL | Status: DC
  Administered 2017-02-28 – 2017-03-04 (×5): 1 via ORAL
  Filled 2017-02-28 (×7): qty 1

## 2017-02-28 MED ORDER — DEXTROSE 5 % IV SOLN
1.5000 g | Freq: Two times a day (BID) | INTRAVENOUS | Status: AC
  Administered 2017-03-01 (×2): 1.5 g via INTRAVENOUS
  Filled 2017-02-28 (×2): qty 1.5

## 2017-02-28 MED ORDER — ONDANSETRON HCL 4 MG/2ML IJ SOLN: 4.0000 mg | Freq: Once | INTRAMUSCULAR | Status: DC | PRN

## 2017-02-28 MED ORDER — ETOMIDATE 2 MG/ML IV SOLN
INTRAVENOUS | Status: DC | PRN
  Administered 2017-02-28: 14 mg via INTRAVENOUS

## 2017-02-28 MED ORDER — SODIUM CHLORIDE 0.9 % IV BOLUS (SEPSIS)
500.0000 mL | Freq: Once | INTRAVENOUS | Status: AC
  Administered 2017-02-28: 500 mL via INTRAVENOUS

## 2017-02-28 MED ORDER — DEXTROSE-NACL 5-0.9 % IV SOLN
INTRAVENOUS | Status: DC
  Administered 2017-02-28: 21:00:00 via INTRAVENOUS

## 2017-02-28 MED ORDER — HYDROMORPHONE HCL 1 MG/ML IJ SOLN
0.5000 mg | Freq: Once | INTRAMUSCULAR | Status: AC
  Administered 2017-02-28: 0.5 mg via INTRAVENOUS
  Filled 2017-02-28: qty 1

## 2017-02-28 MED ORDER — BISACODYL 5 MG PO TBEC
10.0000 mg | DELAYED_RELEASE_TABLET | Freq: Every day | ORAL | Status: DC
Start: 2017-02-28 — End: 2017-03-05
  Administered 2017-03-01 – 2017-03-04 (×4): 10 mg via ORAL
  Filled 2017-02-28 (×5): qty 2

## 2017-02-28 MED ORDER — PROPOFOL 10 MG/ML IV BOLUS
INTRAVENOUS | Status: AC
  Filled 2017-02-28: qty 20

## 2017-02-28 MED ORDER — FENTANYL 25 MCG/HR TD PT72
100.0000 ug | MEDICATED_PATCH | TRANSDERMAL | Status: DC
  Administered 2017-03-01 – 2017-03-04 (×2): 100 ug via TRANSDERMAL
  Filled 2017-02-28 (×2): qty 1
  Filled 2017-02-28: qty 4

## 2017-02-28 SURGICAL SUPPLY — 53 items
ADH SKN CLS APL DERMABOND .7 (GAUZE/BANDAGES/DRESSINGS) ×1
ATTRACTOMAT 16X20 MAGNETIC DRP (DRAPES) ×3 IMPLANT
CANISTER SUCT 3000ML PPV (MISCELLANEOUS) ×3 IMPLANT
CATH THORACIC 28FR (CATHETERS) IMPLANT
CATH THORACIC 28FR RT ANG (CATHETERS) IMPLANT
CATH THORACIC 36FR (CATHETERS) IMPLANT
CATH THORACIC 36FR RT ANG (CATHETERS) ×2 IMPLANT
CONT SPEC 4OZ CLIKSEAL STRL BL (MISCELLANEOUS) ×3 IMPLANT
COVER SURGICAL LIGHT HANDLE (MISCELLANEOUS) ×4 IMPLANT
DERMABOND ADVANCED (GAUZE/BANDAGES/DRESSINGS) ×2
DERMABOND ADVANCED .7 DNX12 (GAUZE/BANDAGES/DRESSINGS) IMPLANT
DRAIN CHANNEL 28F RND 3/8 FF (WOUND CARE) IMPLANT
DRAPE LAPAROSCOPIC ABDOMINAL (DRAPES) ×3 IMPLANT
ELECT REM PT RETURN 9FT ADLT (ELECTROSURGICAL) ×3
ELECTRODE REM PT RTRN 9FT ADLT (ELECTROSURGICAL) ×1 IMPLANT
GAUZE SPONGE 4X4 12PLY STRL (GAUZE/BANDAGES/DRESSINGS) ×3 IMPLANT
GAUZE SPONGE 4X4 12PLY STRL LF (GAUZE/BANDAGES/DRESSINGS) ×2 IMPLANT
GLOVE BIOGEL M 6.5 STRL (GLOVE) ×2 IMPLANT
GLOVE BIOGEL PI IND STRL 6.5 (GLOVE) IMPLANT
GLOVE BIOGEL PI INDICATOR 6.5 (GLOVE) ×8
GLOVE EUDERMIC 7 POWDERFREE (GLOVE) ×3 IMPLANT
GOWN STRL REUS W/ TWL LRG LVL3 (GOWN DISPOSABLE) IMPLANT
GOWN STRL REUS W/ TWL XL LVL3 (GOWN DISPOSABLE) ×1 IMPLANT
GOWN STRL REUS W/TWL LRG LVL3 (GOWN DISPOSABLE) ×9
GOWN STRL REUS W/TWL XL LVL3 (GOWN DISPOSABLE) ×3
HEMOSTAT POWDER SURGIFOAM 1G (HEMOSTASIS) IMPLANT
KIT BASIN OR (CUSTOM PROCEDURE TRAY) ×3 IMPLANT
KIT ROOM TURNOVER OR (KITS) ×3 IMPLANT
NS IRRIG 1000ML POUR BTL (IV SOLUTION) ×3 IMPLANT
PACK CHEST (CUSTOM PROCEDURE TRAY) ×3 IMPLANT
PAD ARMBOARD 7.5X6 YLW CONV (MISCELLANEOUS) ×6 IMPLANT
PAD ELECT DEFIB RADIOL ZOLL (MISCELLANEOUS) ×3 IMPLANT
SUT VIC AB 0 CT1 18XCR BRD 8 (SUTURE) ×1 IMPLANT
SUT VIC AB 0 CT1 8-18 (SUTURE) ×6
SUT VIC AB 1 CTX 18 (SUTURE) IMPLANT
SUT VIC AB 2-0 CT1 27 (SUTURE)
SUT VIC AB 2-0 CT1 TAPERPNT 27 (SUTURE) IMPLANT
SUT VIC AB 3-0 SH 27 (SUTURE) ×3
SUT VIC AB 3-0 SH 27X BRD (SUTURE) ×1 IMPLANT
SUT VIC AB 3-0 X1 27 (SUTURE) IMPLANT
SWAB COLLECTION DEVICE MRSA (MISCELLANEOUS) IMPLANT
SWAB CULTURE ESWAB REG 1ML (MISCELLANEOUS) IMPLANT
SYR 10ML LL (SYRINGE) IMPLANT
SYR 50ML SLIP (SYRINGE) IMPLANT
SYSTEM SAHARA CHEST DRAIN ATS (WOUND CARE) ×2 IMPLANT
TAPE CLOTH SURG 4X10 WHT LF (GAUZE/BANDAGES/DRESSINGS) ×2 IMPLANT
TAPE PAPER 2X10 WHT MICROPORE (GAUZE/BANDAGES/DRESSINGS) IMPLANT
TOWEL GREEN STERILE (TOWEL DISPOSABLE) ×6 IMPLANT
TOWEL OR 17X24 6PK STRL BLUE (TOWEL DISPOSABLE) ×1 IMPLANT
TOWEL OR 17X26 10 PK STRL BLUE (TOWEL DISPOSABLE) ×1 IMPLANT
TRAP SPECIMEN MUCOUS 40CC (MISCELLANEOUS) ×3 IMPLANT
TRAY FOLEY W/METER SILVER 14FR (SET/KITS/TRAYS/PACK) ×3 IMPLANT
WATER STERILE IRR 1000ML POUR (IV SOLUTION) ×6 IMPLANT

## 2017-02-28 NOTE — Consult Note (Signed)
Cardiology Consultation:   Patient ID: [name]; [id number]; 1945/12/26   Admit date: 02/28/2017 Date of Consult: 02/28/2017  Primary Care Provider: Octavio Graves, DO Primary Cardiologist: None Primary Electrophysiologist:  None   Patient Profile:   [name] is a 71 y.o. female with a hx of metastatic adenocarcinoma who is being seen today for the evaluation of cardiac tamponade at the request of emergency department, Dr Billy Fischer.  History of Present Illness:   [name] is a 71 year old female with stage IV metastatic non-small cell lung cancer being treated at [facility] with prior history of thoracentesis, palliative radiation in March 2018, on clinical trial initiated in April 2018 with chronic pain being treated at palliative care clinic, history of depression, diabetes, hyperlipidemia who presented to the emergency department today with progressively worsening shortness of breath, cough, abdominal distention, left upper quadrant pain.  Yellow mucus production.  Echocardiogram was performed in the emergency department and it demonstrated a severe/large circumferential pericardial effusion with some associated thrombus and associated signs of cardiac tamponade physiology.  She currently is somewhat short of breath, nervous, anxious, family at bedside.  Denies any chest pain currently.  Past Medical History:  Diagnosis Date  . Asthma   . Bleeding nose   . Cancer ([place]) 2017   Lung   . Diabetes mellitus   . Diverticulitis   . H. pylori infection   . Hypertension   . Lung nodule 2017   Both lungs-[facility] at [facility].   . Parkinson disease ([place]) 2010  . Tubular adenoma of colon 05/2007    Past Surgical History:  Procedure Laterality Date  . APPENDECTOMY    . CHOLECYSTECTOMY    . COLON SURGERY     colectomy and reversal after diverticular ds  . PARTIAL COLECTOMY    . TONSILLECTOMY    . TOTAL KNEE ARTHROPLASTY     right  . VAGINAL HYSTERECTOMY       Home  Medications:  Prior to Admission medications   Medication Sig Start Date End Date Taking? Authorizing Provider  ALPRAZolam Duanne Moron) 1 MG tablet Take 1 mg by mouth 3 (three) times daily.      [provider]  amLODipine (NORVASC) 10 MG tablet Take 10 mg by mouth daily.    [provider]  bismuth-metronidazole-tetracycline [facility]) [phone] MG capsule Take 3 capsules by mouth 4 (four) times daily -  before meals and at bedtime. Patient taking differently: Take 3 capsules by mouth 3 (three) times daily.  04/20/16   Ladene Artist, MD  carbidopa-levodopa (SINEMET IR) 25-250 MG tablet Take 1 tablet by mouth at bedtime.    [provider]  cyclobenzaprine (FLEXERIL) 10 MG tablet Take 1 tablet (10 mg total) by mouth 2 (two) times daily as needed for muscle spasms. Patient not taking: Reported on 05/06/2016 02/26/16   Mackuen, Courteney Lyn, MD  DEXILANT 60 MG capsule Take 60 mg by mouth daily. For acid reflux 06/24/13   [provider]  Fluticasone-Salmeterol (ADVAIR) 500-50 MCG/DOSE AEPB Inhale 1 puff into the lungs every 12 (twelve) hours.      [provider]  furosemide (LASIX) 20 MG tablet Take 20 mg by mouth daily as needed for fluid.  02/14/16   [provider]  insulin glargine (LANTUS) 100 UNIT/ML injection Inject 150 Units into the skin at bedtime.     [provider]  metroNIDAZOLE (FLAGYL) 500 MG tablet Take 1 tablet (500 mg total) by mouth 3 (three) times daily. Patient  not taking: Reported on 05/06/2016 03/07/15   Thurnell Lose, MD  omeprazole (PRILOSEC) 40 MG capsule Take 1 capsule (40 mg total) by mouth 2 (two) times daily. 04/20/16   Ladene Artist, MD  oxyCODONE-acetaminophen (PERCOCET) 10-325 MG per tablet Take 1 tablet by mouth every 6 (six) hours as needed for pain.  10/03/12   [provider]  PROAIR HFA 108 (90 BASE) MCG/ACT inhaler Inhale 2 puffs into the lungs every 4 (four) hours as needed for wheezing  or shortness of breath.  06/24/13   [provider]  sertraline (ZOLOFT) 100 MG tablet Take 100 mg by mouth daily. 06/29/13   [provider]  simvastatin (ZOCOR) 20 MG tablet Take 20 mg by mouth at bedtime.      [provider]  tiZANidine (ZANAFLEX) 4 MG tablet Take 4 mg by mouth every 6 (six) hours as needed for muscle spasms.    [provider]    Inpatient Medications: Scheduled Meds:  Continuous Infusions:  PRN Meds:   Allergies:    Allergies  Allergen Reactions  . Morphine And Related Nausea And Vomiting and Other (See Comments)    Headache (also)  . Tape Rash    Can only tolerate for short amounts of time (CANNOT BE "LEFT" ON THE SAME SITE FOR LONG)    Social History:   Social History   Socioeconomic History  . Marital status: Single    Spouse name: Not on file  . Number of children: 5  . Years of education: Not on file  . Highest education level: Not on file  Social Needs  . Financial resource strain: Not on file  . Food insecurity - worry: Not on file  . Food insecurity - inability: Not on file  . Transportation needs - medical: Not on file  . Transportation needs - non-medical: Not on file  Occupational History  . Occupation: retired    Comment: Charity fundraiser; [employer]  Tobacco Use  . Smoking status: Former Smoker    Last attempt to quit: 03/20/1987    Years since quitting: 29.9  . Smokeless tobacco: Never Used  Substance and Sexual Activity  . Alcohol use: No  . Drug use: No  . Sexual activity: No  Other Topics Concern  . Not on file  Social History Narrative  . Not on file    Family History:    Family History  Problem Relation Age of Onset  . Diabetes Mellitus II Sister   . Diabetes Mellitus II Son   . Colon cancer Neg Hx      ROS:  Please see the history of present illness.  ROS  Recent lethargy, shortness of breath, occasional chest pain, mild nausea, left upper quadrant pain all other ROS reviewed  and negative.     Physical Exam/Data:   Vitals:   02/28/17 1130 02/28/17 1147 02/28/17 1235 02/28/17 1456  BP: (!) 115/91  100/73 116/88  Pulse: (!) 104  97 (!) 101  Resp: (!) 22  (!) 23 (!) 24  Temp:      TempSrc:      SpO2: 95% 93% 92% 95%   No intake or output data in the 24 hours ending 02/28/17 1522 There were no vitals filed for this visit. There is no height or weight on file to calculate BMI.  General:  Well nourished, well developed, ill-appearing, laying in bed, anxious, agitated HEENT: normal Lymph: no adenopathy Neck: Jawline JVD Endocrine:  No thryomegaly Vascular: No  carotid bruits;  Cardiac: Mildly tachycardic regular, no appreciable rubs, difficult to ascertain a pericardial friction rub or knock, no murmurs Lungs: Decreased breath sounds at bases bilaterally, mild bilateral wheezing, no rhonchi or rales  Abd: soft, mildly tender left upper quadrant, no hepatomegaly, obese Ext: Trace bilateral lower extremity edema Musculoskeletal:  No deformities, BUE and BLE strength normal and equal Skin: warm and dry  Neuro:  CNs 2-12 intact, no focal abnormalities noted Psych:  Normal affect, anxious  EKG:  The EKG was personally reviewed and demonstrates: Sinus tachycardia 123 bpm with right bundle branch block  Telemetry:  Telemetry was personally reviewed and demonstrates: Sinus tachycardia interventricular conduction delay  Relevant CV Studies: Echocardiogram 04/30/16:  - Left ventricle: The cavity size was normal. There was mild   concentric hypertrophy. Systolic function was vigorous. The   estimated ejection fraction was in the range of 65% to 70%. Wall   motion was normal; there were no regional wall motion   abnormalities. - Aortic valve: Transvalvular velocity was within the normal range.   There was no stenosis. There was no regurgitation. - Mitral valve: Transvalvular velocity was within the normal range.   There was no evidence for stenosis. There was no  regurgitation. - Right ventricle: The cavity size was normal. Wall thickness was   normal. Systolic function was normal. - Tricuspid valve: There was no regurgitation. - Pericardium, extracardiac: A large pericardial effusion was   identified circumferential to the heart. Measuring up to 4.6 cm.   The effusion appears loculated with a large area of what appears   to be clot (10cm x 3.0 cm) adjacent to the RV free wall and apex.   There was right ventricular chamber collapse. There was evidence   for increased RV-LV interaction demonstrated by respirophasic   changes in transmitral velocities. Features were consistent with   tamponade physiology.  Recommendations:  Cardiothoracic surgery consultation. This was related to the inpatient cardiology service. Laboratory Data:  Chemistry Recent Labs  Lab 02/28/17 1005  NA 136  K 3.9  CL 105  CO2 21*  GLUCOSE 221*  BUN 16  CREATININE 1.11*  CALCIUM 8.7*  GFRNONAA 49*  GFRAA 57*  ANIONGAP 10    Recent Labs  Lab 02/28/17 1005  PROT 6.9  ALBUMIN 3.1*  AST 54*  ALT 37  ALKPHOS 96  BILITOT 0.6   Hematology Recent Labs  Lab 02/28/17 1005  WBC 11.0*  RBC 4.80  HGB 12.7  HCT 40.1  MCV 83.5  MCH 26.5  MCHC 31.7  RDW 14.5  PLT 302   Cardiac EnzymesNo results for input(s): TROPONINI in the last 168 hours.  Recent Labs  Lab 02/28/17 1017  TROPIPOC 0.01    BNP Recent Labs  Lab 02/28/17 1005  BNP 104.3*    DDimer No results for input(s): DDIMER in the last 168 hours.  Radiology/Studies:  Dg Chest 2 View  Result Date: 02/28/2017 CLINICAL DATA:  WORSENING CHEST PAIN,SOB.PAST FEW DAYS.HX LUNG CA EXAM: CHEST  2 VIEW COMPARISON:  Chest x-ray dated 05/06/2016. FINDINGS: Marked cardiomegaly, heart size significantly increased compared to the previous study. Chronic mild interstitial thickening throughout both lungs. No pleural effusion or pneumothorax seen. Osseous structures about the chest are unremarkable. Mild  degenerative spurring again noted within the thoracic spine. IMPRESSION: 1. Marked cardiomegaly. Heart size has significantly increased compared to the previous study of 05/06/2016. Findings highly suspicious for pericardial effusion. Recommend correlation with ECHO and/or chest CT angiogram. 2. Stable chronic interstitial  prominence within each lung. No new lung findings seen. No evidence of pneumonia or overt alveolar pulmonary edema. These results were called by telephone at the time of interpretation on 02/28/2017 at 11:18 am to Dr. Gareth Morgan , who verbally acknowledged these results. Electronically Signed   By: Franki Cabot M.D.   On: 02/28/2017 11:20   Ct Angio Chest Pe W And/or Wo Contrast  Result Date: 02/28/2017 CLINICAL DATA:  Chest pain, fatigue, stage IV lung cancer. Abdominal distention/pain. EXAM: CT ANGIOGRAPHY CHEST CT ABDOMEN AND PELVIS WITH CONTRAST TECHNIQUE: Multidetector CT imaging of the chest was performed using the standard protocol during bolus administration of intravenous contrast. Multiplanar CT image reconstructions and MIPs were obtained to evaluate the vascular anatomy. Multidetector CT imaging of the abdomen and pelvis was performed using the standard protocol during bolus administration of intravenous contrast. CONTRAST:  100 mL Isovue 370 IV COMPARISON:  CTA chest dated 05/06/2016. CT abdomen/ pelvis dated 04/14/2016. FINDINGS: CTA CHEST FINDINGS Cardiovascular: Satisfactory opacification of the bilateral pulmonary arteries to the lobar level. No evidence of pulmonary embolism. Large pericardial effusion, new. No evidence of thoracic aortic aneurysm. Atherosclerotic calcifications of the aortic arch. Mediastinum/Nodes: 14 mm subcarinal node, previously 3.0 cm. Suspected 2.2 cm right paratracheal node (series 5/ image 37), poorly visualized, previously 1.7 cm. Visualized thyroid is unremarkable. Lungs/Pleura: Evaluation of the lung parenchyma is constrained by respiratory  motion. Within that constraint, there are no suspicious pulmonary nodules. Mild paraseptal emphysematous changes, upper lobe predominant. Small bilateral pleural effusions, right greater than left. No pneumothorax. Musculoskeletal: Degenerative changes of the visualized thoracolumbar spine. Review of the MIP images confirms the above findings. CT ABDOMEN and PELVIS FINDINGS Hepatobiliary: Liver is within normal limits. Status post cholecystectomy. No intrahepatic or extrahepatic ductal dilatation. Pancreas: Within normal limits. Spleen: Normal in size. Adrenals/Urinary Tract: Adrenal glands are within normal limits. Kidneys are within normal limits.  No hydronephrosis. Bladder is underdistended but unremarkable. Stomach/Bowel: Stomach is within normal limits. No evidence of bowel obstruction. Appendix is not discretely visualized. Postsurgical changes related to left colonic resection with suture line in the left lower abdomen (series 12/ image 66). Vascular/Lymphatic: No evidence of abdominal aortic aneurysm. Atherosclerotic calcifications of the abdominal aorta and branch vessels. No suspicious abdominopelvic lymphadenopathy. Reproductive: Status post hysterectomy. No adnexal masses. Other: Small volume perihepatic and pelvic ascites. Postsurgical changes along the anterior abdominal wall with tiny midline fat containing ventral hernias. Musculoskeletal: Degenerative changes of the visualized thoracolumbar spine. Dominant superior endplate Schmorl's node involving L4. Review of the MIP images confirms the above findings. IMPRESSION: No evidence of pulmonary embolism. Large pericardial effusion. Consider echocardiogram to exclude cardiac tamponade, as clinically warranted. Mediastinal lymphadenopathy, as described above, with overall mixed response. Nodal metastases remain possible. Small bilateral pleural effusions, right greater than left. Small volume abdominopelvic ascites. No evidence of metastatic disease in  the abdomen/pelvis. Electronically Signed   By: Julian Hy M.D.   On: 02/28/2017 14:09   Ct Abdomen Pelvis W Contrast  Result Date: 02/28/2017 CLINICAL DATA:  Chest pain, fatigue, stage IV lung cancer. Abdominal distention/pain. EXAM: CT ANGIOGRAPHY CHEST CT ABDOMEN AND PELVIS WITH CONTRAST TECHNIQUE: Multidetector CT imaging of the chest was performed using the standard protocol during bolus administration of intravenous contrast. Multiplanar CT image reconstructions and MIPs were obtained to evaluate the vascular anatomy. Multidetector CT imaging of the abdomen and pelvis was performed using the standard protocol during bolus administration of intravenous contrast. CONTRAST:  100 mL Isovue 370 IV COMPARISON:  CTA  chest dated 05/06/2016. CT abdomen/ pelvis dated 04/14/2016. FINDINGS: CTA CHEST FINDINGS Cardiovascular: Satisfactory opacification of the bilateral pulmonary arteries to the lobar level. No evidence of pulmonary embolism. Large pericardial effusion, new. No evidence of thoracic aortic aneurysm. Atherosclerotic calcifications of the aortic arch. Mediastinum/Nodes: 14 mm subcarinal node, previously 3.0 cm. Suspected 2.2 cm right paratracheal node (series 5/ image 37), poorly visualized, previously 1.7 cm. Visualized thyroid is unremarkable. Lungs/Pleura: Evaluation of the lung parenchyma is constrained by respiratory motion. Within that constraint, there are no suspicious pulmonary nodules. Mild paraseptal emphysematous changes, upper lobe predominant. Small bilateral pleural effusions, right greater than left. No pneumothorax. Musculoskeletal: Degenerative changes of the visualized thoracolumbar spine. Review of the MIP images confirms the above findings. CT ABDOMEN and PELVIS FINDINGS Hepatobiliary: Liver is within normal limits. Status post cholecystectomy. No intrahepatic or extrahepatic ductal dilatation. Pancreas: Within normal limits. Spleen: Normal in size. Adrenals/Urinary Tract:  Adrenal glands are within normal limits. Kidneys are within normal limits.  No hydronephrosis. Bladder is underdistended but unremarkable. Stomach/Bowel: Stomach is within normal limits. No evidence of bowel obstruction. Appendix is not discretely visualized. Postsurgical changes related to left colonic resection with suture line in the left lower abdomen (series 12/ image 66). Vascular/Lymphatic: No evidence of abdominal aortic aneurysm. Atherosclerotic calcifications of the abdominal aorta and branch vessels. No suspicious abdominopelvic lymphadenopathy. Reproductive: Status post hysterectomy. No adnexal masses. Other: Small volume perihepatic and pelvic ascites. Postsurgical changes along the anterior abdominal wall with tiny midline fat containing ventral hernias. Musculoskeletal: Degenerative changes of the visualized thoracolumbar spine. Dominant superior endplate Schmorl's node involving L4. Review of the MIP images confirms the above findings. IMPRESSION: No evidence of pulmonary embolism. Large pericardial effusion. Consider echocardiogram to exclude cardiac tamponade, as clinically warranted. Mediastinal lymphadenopathy, as described above, with overall mixed response. Nodal metastases remain possible. Small bilateral pleural effusions, right greater than left. Small volume abdominopelvic ascites. No evidence of metastatic disease in the abdomen/pelvis. Electronically Signed   By: Julian Hy M.D.   On: 02/28/2017 14:09    Assessment and Plan:   71 year old female with stage IV, metastatic adenocarcinoma with pericardial tamponade, large pericardial effusion.  Pericardial tamponade/large pericardial effusion - We have discussed potential treatment options and we prefer cardiothoracic surgery to perform pericardial window for a more durable long-lasting procedure for potential reaccumulation of fluid which can certainly happen in the setting of metastatic lung cancer.  She is amenable to  proceeding.  I gave her the option of not proceeding with any further invasive procedures and she clearly stated that she desired to move forward with pericardial window.  She understands that without this procedure, cardiac collapse would eventually occur.  Dr. Cyndia Bent with [facility] has been contacted.  Blood pressure at this time is in the low 528U systolic and heart rate currently is ranging between 100-123.  She does appear quite anxious, JVD is apparent.  Metastatic lung cancer -Currently being treated at [facility].  Palliative radiation.  She has had thoracentesis performed in the past.  Diabetes -Per hospitalist team.  Essential hypertension - Hold antihypertensives at this time.  Critical care time 40 minutes spent with patient, family, extensive data review, review of echocardiogram personally, coordination with emergency department and cardiothoracic surgery.  She is critically ill with metastatic lung cancer derived pericardial effusion which is showing signs of cardiovascular collapse on echocardiogram.  For questions or updates, please contact [place] Please consult [email or web address] for contact info under Cardiology/STEMI.   Signed, Candee Furbish, MD  02/28/2017 3:22 PM

## 2017-02-28 NOTE — ED Triage Notes (Signed)
Pt reports onset Sunday of mid epigastric pain and feels like abd is distended, causing her to be sob with generalized fatigue. Denies swelling to extremities. Has recent productive cough.

## 2017-02-28 NOTE — Anesthesia Procedure Notes (Addendum)
Central Venous Catheter Insertion Performed by: Roberts Gaudy, MD, anesthesiologist Start/End11/10/2016 4:20 PM, 02/28/2017 4:20 AM Patient location: OR. Preanesthetic checklist: patient identified, IV checked, site marked, risks and benefits discussed, surgical consent, monitors and equipment checked, pre-op evaluation and timeout performed Position: supine Hand hygiene performed , maximum sterile barriers used  and Seldinger technique used Catheter size: 8 Fr Central line was placed.Double lumen Procedure performed using ultrasound guided technique. Ultrasound Notes:anatomy identified, needle tip was noted to be adjacent to the nerve/plexus identified and no ultrasound evidence of intravascular and/or intraneural injection Attempts: 1 Following insertion, line sutured, dressing applied and Biopatch. Post procedure assessment: blood return through all ports, free fluid flow and no air  Patient tolerated the procedure well with no immediate complications.

## 2017-02-28 NOTE — Progress Notes (Signed)
  Echocardiogram 2D Echocardiogram has been performed.  [name] 02/28/2017, 2:50 PM

## 2017-02-28 NOTE — Anesthesia Procedure Notes (Signed)
Procedure Name: Intubation Date/Time: 02/28/2017 4:41 PM Performed by: Myna Bright, CRNA Pre-anesthesia Checklist: Patient identified, Emergency Drugs available, Suction available and Patient being monitored Patient Re-evaluated:Patient Re-evaluated prior to induction Oxygen Delivery Method: Circle system utilized Preoxygenation: Pre-oxygenation with 100% oxygen Induction Type: IV induction, Rapid sequence and Cricoid Pressure applied Laryngoscope Size: Mac and 4 Grade View: Grade I Tube type: Oral Tube size: 7.0 mm Number of attempts: 1 Airway Equipment and Method: Stylet Placement Confirmation: ETT inserted through vocal cords under direct vision,  positive ETCO2 and breath sounds checked- equal and bilateral Secured at: 21 cm Tube secured with: Tape Dental Injury: Teeth and Oropharynx as per pre-operative assessment

## 2017-02-28 NOTE — Anesthesia Procedure Notes (Signed)
Arterial Line Insertion Start/End11/10/2016 4:32 PM, 02/28/2017 4:35 PM Performed by: Colin Benton, CRNA, CRNA  Preanesthetic checklist: patient identified, IV checked, site marked, risks and benefits discussed, surgical consent, monitors and equipment checked, pre-op evaluation, timeout performed and anesthesia consent Lidocaine 1% used for infiltration Left, radial was placed Catheter size: 20 G Hand hygiene performed , maximum sterile barriers used  and Seldinger technique used Allen's test indicative of satisfactory collateral circulation Attempts: 1 Procedure performed without using ultrasound guided technique. Following insertion, dressing applied and Biopatch. Post procedure assessment: normal and unchanged  Patient tolerated the procedure well with no immediate complications.

## 2017-02-28 NOTE — Transfer of Care (Signed)
Immediate Anesthesia Transfer of Care Note  Patient: [name]  Procedure(s) Performed: PERICARDIAL WINDOW (N/A )  Patient Location: PACU  Anesthesia Type:General  Level of Consciousness: sedated and responds to stimulation  Airway & Oxygen Therapy: Patient Spontanous Breathing and Patient connected to nasal cannula oxygen  Post-op Assessment: Report given to RN, Post -op Vital signs reviewed and stable and Patient moving all extremities  Post vital signs: Reviewed and stable  Last Vitals:  Vitals:   02/28/17 1515 02/28/17 1528  BP: 112/90   Pulse: (!) 106   Resp: (!) 23   Temp:    SpO2: 93% 93%    Last Pain:  Vitals:   02/28/17 1529  TempSrc:   PainSc: 6          Complications: No apparent anesthesia complications

## 2017-02-28 NOTE — CV Procedure (Signed)
Intra-operative Transesophageal Echocardiography Report:  [name] is a 71 year old female with a history of Stage IV non-small cell lung cancer treated previously with palliative radiation and immunotherapy who presented to the OR with a 3-4 day history of worsening SOB. Echocardiogram today revealed a large circumferential pericardial effusion with evidence of tamponade. She is now brought to the OR for emergency pericardial window.  Following induction of general anesthesia and endotracheal intubation and orogastric suctioning,  the transesophageal echocardiograhy probe was inserted into the esophagus without difficulty.  Impression:  1. Pericardium: There was a large circumferential pericardial effusion which measured 3.6 cm posteriorly. There was fibrinous strands within the pericardial effusion and shaggy exudate adherent to the myocardial surface. Following drainage of the pericardial fluid, there was no residual pericardial fluid appreciated.  2. Left Ventricle: The LV cavity was underfilled and there was vigorous contractility in all segments interrogated. The ejection fraction was estimated at 65-70%.  Following drainage of the pericardial effusion, there was improved LV filling with good contractility in all segments interrogated. The ejection fration was estimated at 60-65%  3. Right Ventricle: The RV cavity was underfilled and there was diastolic invagination of the RV free wall but no frank diastolic collapse.  Following drainage of the pericardial effusion there was improved RV filling. There was normal appearing RV systolic function.  4. Right atrium: The RA was underfilled and there was diastolic invagination but no diastolic collapse.  5. Interatrial Septum: The inter-atrial septum was bowing from either left to right or right to left depending on the respiratory cycle. There was no evidence of patent foramen ovale by color Doppler but a bubble study was suggestive of a  patent foramen ovale.  6. Left Atrium:  The left atrium was under filled. There was no thrombus noted within the LA cavity or left atriall appendage. The LA appendage was invaginated prior to drainage of the pericardial effusion.  7. Aortic Valve: The aortic valve was tri-leaflet. The leaflets were mildly thickened and calcified. There was normal leaflet separation and no aortic in sufficiency by color Doppler.  8. Mitral Valve: The mitral leaflets were mildly thickened but opened normally and there was trace mitral insufficiency. There were no flail or prolapsing leaflet segments noted.  9. Tricuspid Valve: Prior to drainage of the pericardial effusion, there trace 1+ tricuspid insufficiency with a central jet. Following drainage of the pericardial effusion, there was 1-2+ tricuspid insufficiency.  Roberts Gaudy, MD

## 2017-02-28 NOTE — Op Note (Signed)
CARDIOVASCULAR SURGERY OPERATIVE NOTE  02/28/2017  Surgeon:  [name], MD  First Assistant: Sharee Pimple, RNFA   Preoperative Diagnosis:  Stage IV lung cancer with large pericardial effusion  Postoperative Diagnosis:  Same  Procedure:  Subxyphoid pericardial window   Anesthesia:  General Endotracheal   Clinical History/Surgical Indication:  The patient is a 71 year old woman with stage IV RUL non-small cell lung cancer treated at [facility] on current immunotherapy, diabetes, hyperlipidemia who was seen within the past couple of weeks at [facility] and told that she had some pericardial effusion but no treatment needed. She now presents with a several day history of progressive shortness of breath, cough, upper abdominal pain and abdominal distension. She had a CT of the chest today which showed no PE but did show a large pericardial effusion and small bilateral pleural effusions. There is a 14 mm subcarinal lymph node that was previously 3 cm and a 2.2 cm right paratracheal node that was previously 1.7 cm. She had an echo in the ER showing a large circumferential pericardial effusion with some fibrin stranding and some RV collapse and features consistent with tamponade. LVEF 65-70%. Cardiac valves look ok. This patient has stage IV lung cancer with a progressively increasing pericardial effusion now presenting with symptoms and signs of tamponade. I have personally reviewed and interpreted her echo and CT images. This is highly suspicious for a malignant pericardial effusion. She has low normal SBP of 100, resting tachycardial 110, lactic acid of 2.12 on presentation. This will require emergent drainage in the OR by subxyphoid pericardial window. I discussed the procedure with the patient and her family including alternatives, benefits and risks including but not limited to bleeding, blood transfusion, infection, injury to the heart, recurrence of the effusion and she understands and agrees to  proceed.    Preparation:  The patient was seen in the preoperative holding area and the correct patient, correct operation were confirmed with the patient after reviewing the medical record and catheterization. The consent was signed by me. Preoperative antibiotics were given.  The patient was taken back to the operating room and positioned supine on the operating room table. After being placed under general endotracheal anesthesia by the anesthesia team a foley catheter was placed. The neck, chest, and abdomen were prepped with betadine soap and solution and draped in the usual sterile manner. A surgical time-out was taken and the correct patient and operative procedure were confirmed with the nursing and anesthesia staff.   TEE:  Performed by Dr. Roberts Gaudy and shows a large circumferential pericardial effusion with RV and RA collapse.  Subxyphoid pericardial window:  A 4 cm incision was made over the xyphoid process and carried down through the subcutaneous tissue using cautery until the midline fascia was encountered. The fascia was divided in the midline and the subxyphoid space entered. The anterior pericardium was visualized just at the diaphragm and it was opened with cautery. The pericardial space was entered and about 1500 cc of old bloody fluid was removed with an immediate rise in BP to the 180 range. Fluid was sent for cytology. The pericardial window specimen was sent for pathology.  TEE confirmed that all of the fluid was removed. A 36 F right angle chest tube was placed in the inferior pericardial space through a separate small incision. Hemostasis was complete. The midline fascia was approximated with interrupted #0 vicryl sutures. The subcutaneous tissue was approximated with continuous 2-0 vicryl suture and the skin with 3-0  vicryl subcuticular suture. The sponge, needle, and instrument counts were correct according to the nurses. Dermabond was applied to the incision. The patient  was awakened, extubated, and transported to the PACU in stable condition.

## 2017-02-28 NOTE — ED Provider Notes (Signed)
[facility] Provider Note   CSN: [id number] Arrival date & time: 02/28/17  0935     History   Chief Complaint Chief Complaint  Patient presents with  . Shortness of Breath  . Chest Pain    HPI [name] is a 71 y.o. female with history of lung cancer currently undergoing treatment who presents with a 4-day history of shortness of breath and cough.  She is also had associated abdominal distention and left upper quadrant pain.  Patient reports any movement causes her to cough up yellow mucus.  She denies any fevers.  She reports significant pain in her left upper abdomen.  She denies any nausea, vomiting, diarrhea, or urinary symptoms.  She was feeling well prior to onset of the symptoms.  She has not felt like this throughout the course of her cancer treatment.  She was active prior to the onset of her symptoms.  She has no history of blood clots and denies any leg pain or swelling.  HPI  Past Medical History:  Diagnosis Date  . Asthma   . Bleeding nose   . Cancer ([place]) 2017   Lung   . Diabetes mellitus   . Diverticulitis   . H. pylori infection   . Hypertension   . Lung nodule 2017   Both lungs-[facility] at [facility].   . Parkinson disease ([place]) 2010  . Tubular adenoma of colon 05/2007    Patient Active Problem List   Diagnosis Date Noted  . Tamponade 02/28/2017  . Chest pain 02/26/2016  . Colitis 03/02/2015  . Diabetes mellitus type 2, controlled ([place]) 03/02/2015  . Hyperlipidemia 03/02/2015  . OSA (obstructive sleep apnea) 05/10/2012  . Essential tremor 03/19/2012  . DIVERTICULITIS OF COLON 02/03/2008  . ANEMIA, IRON DEFICIENCY 12/27/2007  . HEMORRHOIDS, INTERNAL 12/27/2007  . GERD 12/27/2007  . GASTRITIS 12/27/2007  . DIVERTICULOSIS, COLON 12/27/2007  . PUD, HX OF 12/27/2007  . COLONIC POLYPS, HX OF 12/27/2007  . ANXIETY 04/23/2007  . Essential hypertension 04/23/2007  . ARTHRITIS 04/23/2007  . SLEEP APNEA 04/23/2007  . DM 04/25/1991  .  Asthma 04/23/1987    Past Surgical History:  Procedure Laterality Date  . APPENDECTOMY    . CHOLECYSTECTOMY    . COLON SURGERY     colectomy and reversal after diverticular ds  . PARTIAL COLECTOMY    . TONSILLECTOMY    . TOTAL KNEE ARTHROPLASTY     right  . VAGINAL HYSTERECTOMY      OB History    No data available       Home Medications    Prior to Admission medications   Medication Sig Start Date End Date Taking? Authorizing Provider  Oxycodone HCl 20 MG TABS Take 20-40 mg every 4 (four) hours as needed by mouth for pain. 02/21/17  Yes [provider]  ALPRAZolam Duanne Moron) 1 MG tablet Take 1 mg by mouth 3 (three) times daily.      [provider]  amLODipine (NORVASC) 10 MG tablet Take 10 mg by mouth daily.    [provider]  bismuth-metronidazole-tetracycline [facility]) [id number] MG capsule Take 3 capsules by mouth 4 (four) times daily -  before meals and at bedtime. Patient taking differently: Take 3 capsules by mouth 3 (three) times daily.  04/20/16   Ladene Artist, MD  carbidopa-levodopa (SINEMET IR) 25-250 MG tablet Take 1 tablet by mouth at bedtime.    [provider]  cyclobenzaprine (FLEXERIL) 10 MG tablet Take 1 tablet (  10 mg total) by mouth 2 (two) times daily as needed for muscle spasms. Patient not taking: Reported on 05/06/2016 02/26/16   Mackuen, Courteney Lyn, MD  DEXILANT 60 MG capsule Take 60 mg by mouth daily. For acid reflux 06/24/13   [provider]  fentaNYL (DURAGESIC - DOSED MCG/HR) 100 MCG/HR Place 100 mcg every 3 (three) days onto the skin. 02/21/17   [provider]  Fluticasone-Salmeterol (ADVAIR) 500-50 MCG/DOSE AEPB Inhale 1 puff into the lungs every 12 (twelve) hours.      [provider]  furosemide (LASIX) 20 MG tablet Take 20 mg by mouth daily as needed for fluid.  02/14/16   [provider]  insulin glargine (LANTUS) 100 UNIT/ML injection Inject 150 Units into the skin at  bedtime.     [provider]  metroNIDAZOLE (FLAGYL) 500 MG tablet Take 1 tablet (500 mg total) by mouth 3 (three) times daily. Patient not taking: Reported on 05/06/2016 03/07/15   Thurnell Lose, MD  omeprazole (PRILOSEC) 40 MG capsule Take 1 capsule (40 mg total) by mouth 2 (two) times daily. 04/20/16   Ladene Artist, MD  PROAIR HFA [phone] BASE) MCG/ACT inhaler Inhale 2 puffs into the lungs every 4 (four) hours as needed for wheezing or shortness of breath.  06/24/13   [provider]  sertraline (ZOLOFT) 100 MG tablet Take 100 mg by mouth daily. 06/29/13   [provider]  simvastatin (ZOCOR) 20 MG tablet Take 20 mg by mouth at bedtime.      [provider]  tiZANidine (ZANAFLEX) 4 MG tablet Take 4 mg by mouth every 6 (six) hours as needed for muscle spasms.    [provider]    Family History Family History  Problem Relation Age of Onset  . Diabetes Mellitus II Sister   . Diabetes Mellitus II Son   . Colon cancer Neg Hx     Social History Social History   Tobacco Use  . Smoking status: Former Smoker    Last attempt to quit: 03/20/1987    Years since quitting: 29.9  . Smokeless tobacco: Never Used  Substance Use Topics  . Alcohol use: No  . Drug use: No     Allergies   Morphine and related and Tape   Review of Systems Review of Systems  Constitutional: Negative for chills and fever.  HENT: Negative for facial swelling and sore throat.   Respiratory: Positive for cough and shortness of breath.   Cardiovascular: Negative for chest pain.  Gastrointestinal: Positive for abdominal distention and abdominal pain (LUQ). Negative for blood in stool, constipation, diarrhea, nausea and vomiting.  Genitourinary: Negative for dysuria.  Musculoskeletal: Negative for back pain.  Skin: Negative for rash and wound.  Neurological: Negative for headaches.  Psychiatric/Behavioral: The patient is not nervous/anxious.      Physical  Exam Updated Vital Signs BP 112/90   Pulse (!) 106   Temp (!) 97.3 F (36.3 C)   Resp (!) 23   SpO2 93%   Physical Exam  Constitutional: She appears well-developed and well-nourished. No distress.  HENT:  Head: Normocephalic and atraumatic.  Mouth/Throat: Oropharynx is clear and moist. No oropharyngeal exudate.  Eyes: Conjunctivae are normal. Pupils are equal, round, and reactive to light. Right eye exhibits no discharge. Left eye exhibits no discharge. No scleral icterus.  Neck: Normal range of motion. Neck supple. No thyromegaly present.  Cardiovascular: Normal rate, regular rhythm, normal heart sounds and intact distal pulses. Exam  reveals no gallop and no friction rub.  No murmur heard. Pulmonary/Chest: Effort normal. No stridor. No respiratory distress. She has decreased breath sounds. She has wheezes (few expiratory bilaterally). She has no rales.  Abdominal: Soft. Bowel sounds are normal. She exhibits no distension. There is tenderness in the left upper quadrant. There is no rebound and no guarding.  Musculoskeletal: She exhibits no edema.  Lymphadenopathy:    She has no cervical adenopathy.  Neurological: She is alert. Coordination normal.  Skin: Skin is warm and dry. No rash noted. She is not diaphoretic. No pallor.  Psychiatric: She has a normal mood and affect.  Nursing note and vitals reviewed.    ED Treatments / Results  Labs (all labs ordered are listed, but only abnormal results are displayed) Labs Reviewed  BASIC METABOLIC PANEL - Abnormal; Notable for the following components:      Result Value   CO2 21 (*)    Glucose, Bld 221 (*)    Creatinine, Ser 1.11 (*)    Calcium 8.7 (*)    GFR calc non Af Amer 49 (*)    GFR calc Af Amer 57 (*)    All other components within normal limits  CBC - Abnormal; Notable for the following components:   WBC 11.0 (*)    All other components within normal limits  BRAIN NATRIURETIC PEPTIDE - Abnormal; Notable for the following  components:   B Natriuretic Peptide 104.3 (*)    All other components within normal limits  HEPATIC FUNCTION PANEL - Abnormal; Notable for the following components:   Albumin 3.1 (*)    AST 54 (*)    All other components within normal limits  CBG MONITORING, ED - Abnormal; Notable for the following components:   Glucose-Capillary 222 (*)    All other components within normal limits  I-STAT CG4 LACTIC ACID, ED - Abnormal; Notable for the following components:   Lactic Acid, Venous 2.12 (*)    All other components within normal limits  I-STAT TROPONIN, ED  I-STAT CG4 LACTIC ACID, ED  TYPE AND SCREEN  ABO/RH  CYTOLOGY - NON PAP  SURGICAL PATHOLOGY    EKG  EKG Interpretation  Date/Time:  Wednesday February 28 2017 09:52:23 EST Ventricular Rate:  123 PR Interval:  100 QRS Duration: 124 QT Interval:  346 QTC Calculation: 495 R Axis:   133 Text Interpretation:  Sinus tachycardia with short PR Right bundle branch block Abnormal ECG Since prior ECG, rate has increased , no other significant changes Confirmed by Gareth Morgan [phone]) on 02/28/2017 10:34:20 AM       Radiology Dg Chest 2 View  Result Date: 02/28/2017 CLINICAL DATA:  WORSENING CHEST PAIN,SOB.PAST FEW DAYS.HX LUNG CA EXAM: CHEST  2 VIEW COMPARISON:  Chest x-ray dated 05/06/2016. FINDINGS: Marked cardiomegaly, heart size significantly increased compared to the previous study. Chronic mild interstitial thickening throughout both lungs. No pleural effusion or pneumothorax seen. Osseous structures about the chest are unremarkable. Mild degenerative spurring again noted within the thoracic spine. IMPRESSION: 1. Marked cardiomegaly. Heart size has significantly increased compared to the previous study of 05/06/2016. Findings highly suspicious for pericardial effusion. Recommend correlation with ECHO and/or chest CT angiogram. 2. Stable chronic interstitial prominence within each lung. No new lung findings seen. No evidence of  pneumonia or overt alveolar pulmonary edema. These results were called by telephone at the time of interpretation on 02/28/2017 at 11:18 am to Dr. Gareth Morgan , who verbally acknowledged these results. Electronically Signed  By: Franki Cabot M.D.   On: 02/28/2017 11:20   Ct Angio Chest Pe W And/or Wo Contrast  Result Date: 02/28/2017 CLINICAL DATA:  Chest pain, fatigue, stage IV lung cancer. Abdominal distention/pain. EXAM: CT ANGIOGRAPHY CHEST CT ABDOMEN AND PELVIS WITH CONTRAST TECHNIQUE: Multidetector CT imaging of the chest was performed using the standard protocol during bolus administration of intravenous contrast. Multiplanar CT image reconstructions and MIPs were obtained to evaluate the vascular anatomy. Multidetector CT imaging of the abdomen and pelvis was performed using the standard protocol during bolus administration of intravenous contrast. CONTRAST:  100 mL Isovue 370 IV COMPARISON:  CTA chest dated 05/06/2016. CT abdomen/ pelvis dated 04/14/2016. FINDINGS: CTA CHEST FINDINGS Cardiovascular: Satisfactory opacification of the bilateral pulmonary arteries to the lobar level. No evidence of pulmonary embolism. Large pericardial effusion, new. No evidence of thoracic aortic aneurysm. Atherosclerotic calcifications of the aortic arch. Mediastinum/Nodes: 14 mm subcarinal node, previously 3.0 cm. Suspected 2.2 cm right paratracheal node (series 5/ image 37), poorly visualized, previously 1.7 cm. Visualized thyroid is unremarkable. Lungs/Pleura: Evaluation of the lung parenchyma is constrained by respiratory motion. Within that constraint, there are no suspicious pulmonary nodules. Mild paraseptal emphysematous changes, upper lobe predominant. Small bilateral pleural effusions, right greater than left. No pneumothorax. Musculoskeletal: Degenerative changes of the visualized thoracolumbar spine. Review of the MIP images confirms the above findings. CT ABDOMEN and PELVIS FINDINGS Hepatobiliary: Liver  is within normal limits. Status post cholecystectomy. No intrahepatic or extrahepatic ductal dilatation. Pancreas: Within normal limits. Spleen: Normal in size. Adrenals/Urinary Tract: Adrenal glands are within normal limits. Kidneys are within normal limits.  No hydronephrosis. Bladder is underdistended but unremarkable. Stomach/Bowel: Stomach is within normal limits. No evidence of bowel obstruction. Appendix is not discretely visualized. Postsurgical changes related to left colonic resection with suture line in the left lower abdomen (series 12/ image 66). Vascular/Lymphatic: No evidence of abdominal aortic aneurysm. Atherosclerotic calcifications of the abdominal aorta and branch vessels. No suspicious abdominopelvic lymphadenopathy. Reproductive: Status post hysterectomy. No adnexal masses. Other: Small volume perihepatic and pelvic ascites. Postsurgical changes along the anterior abdominal wall with tiny midline fat containing ventral hernias. Musculoskeletal: Degenerative changes of the visualized thoracolumbar spine. Dominant superior endplate Schmorl's node involving L4. Review of the MIP images confirms the above findings. IMPRESSION: No evidence of pulmonary embolism. Large pericardial effusion. Consider echocardiogram to exclude cardiac tamponade, as clinically warranted. Mediastinal lymphadenopathy, as described above, with overall mixed response. Nodal metastases remain possible. Small bilateral pleural effusions, right greater than left. Small volume abdominopelvic ascites. No evidence of metastatic disease in the abdomen/pelvis. Electronically Signed   By: Julian Hy M.D.   On: 02/28/2017 14:09   Ct Abdomen Pelvis W Contrast  Result Date: 02/28/2017 CLINICAL DATA:  Chest pain, fatigue, stage IV lung cancer. Abdominal distention/pain. EXAM: CT ANGIOGRAPHY CHEST CT ABDOMEN AND PELVIS WITH CONTRAST TECHNIQUE: Multidetector CT imaging of the chest was performed using the standard protocol  during bolus administration of intravenous contrast. Multiplanar CT image reconstructions and MIPs were obtained to evaluate the vascular anatomy. Multidetector CT imaging of the abdomen and pelvis was performed using the standard protocol during bolus administration of intravenous contrast. CONTRAST:  100 mL Isovue 370 IV COMPARISON:  CTA chest dated 05/06/2016. CT abdomen/ pelvis dated 04/14/2016. FINDINGS: CTA CHEST FINDINGS Cardiovascular: Satisfactory opacification of the bilateral pulmonary arteries to the lobar level. No evidence of pulmonary embolism. Large pericardial effusion, new. No evidence of thoracic aortic aneurysm. Atherosclerotic calcifications of the aortic arch. Mediastinum/Nodes: 14 mm  subcarinal node, previously 3.0 cm. Suspected 2.2 cm right paratracheal node (series 5/ image 37), poorly visualized, previously 1.7 cm. Visualized thyroid is unremarkable. Lungs/Pleura: Evaluation of the lung parenchyma is constrained by respiratory motion. Within that constraint, there are no suspicious pulmonary nodules. Mild paraseptal emphysematous changes, upper lobe predominant. Small bilateral pleural effusions, right greater than left. No pneumothorax. Musculoskeletal: Degenerative changes of the visualized thoracolumbar spine. Review of the MIP images confirms the above findings. CT ABDOMEN and PELVIS FINDINGS Hepatobiliary: Liver is within normal limits. Status post cholecystectomy. No intrahepatic or extrahepatic ductal dilatation. Pancreas: Within normal limits. Spleen: Normal in size. Adrenals/Urinary Tract: Adrenal glands are within normal limits. Kidneys are within normal limits.  No hydronephrosis. Bladder is underdistended but unremarkable. Stomach/Bowel: Stomach is within normal limits. No evidence of bowel obstruction. Appendix is not discretely visualized. Postsurgical changes related to left colonic resection with suture line in the left lower abdomen (series 12/ image 66).  Vascular/Lymphatic: No evidence of abdominal aortic aneurysm. Atherosclerotic calcifications of the abdominal aorta and branch vessels. No suspicious abdominopelvic lymphadenopathy. Reproductive: Status post hysterectomy. No adnexal masses. Other: Small volume perihepatic and pelvic ascites. Postsurgical changes along the anterior abdominal wall with tiny midline fat containing ventral hernias. Musculoskeletal: Degenerative changes of the visualized thoracolumbar spine. Dominant superior endplate Schmorl's node involving L4. Review of the MIP images confirms the above findings. IMPRESSION: No evidence of pulmonary embolism. Large pericardial effusion. Consider echocardiogram to exclude cardiac tamponade, as clinically warranted. Mediastinal lymphadenopathy, as described above, with overall mixed response. Nodal metastases remain possible. Small bilateral pleural effusions, right greater than left. Small volume abdominopelvic ascites. No evidence of metastatic disease in the abdomen/pelvis. Electronically Signed   By: Julian Hy M.D.   On: 02/28/2017 14:09    Procedures Procedures (including critical care time)  CRITICAL CARE Performed by: Frederica Kuster   Total critical care time: 30 minutes  Critical care time was exclusive of separately billable procedures and treating other patients.  Critical care was necessary to treat or prevent imminent or life-threatening deterioration.  Critical care was time spent personally by me on the following activities: development of treatment plan with patient and/or surrogate as well as nursing, discussions with consultants, evaluation of patient's response to treatment, examination of patient, obtaining history from patient or surrogate, ordering and performing treatments and interventions, ordering and review of laboratory studies, ordering and review of radiographic studies, pulse oximetry and re-evaluation of patient's condition.   Medications  Ordered in ED Medications  insulin aspart (novoLOG) injection 0-15 Units (not administered)  albuterol (PROVENTIL) (2.5 MG/3ML) 0.083% nebulizer solution 2.5 mg (not administered)  Oxycodone HCl TABS 20 mg (not administered)  fentaNYL (DURAGESIC - dosed mcg/hr) 100 mcg (not administered)  fentaNYL (SUBLIMAZE) injection 25-50 mcg (not administered)  ondansetron (ZOFRAN) injection 4 mg (not administered)  ipratropium-albuterol (DUONEB) 0.5-2.5 (3) MG/3ML nebulizer solution 3 mL (3 mLs Nebulization Given 02/28/17 1145)  sodium chloride 0.9 % bolus 500 mL (0 mLs Intravenous Stopped 02/28/17 1415)  iopamidol (ISOVUE-370) 76 % injection (100 mLs  Contrast Given 02/28/17 1339)  fentaNYL (SUBLIMAZE) injection 50 mcg (50 mcg Intravenous Given 02/28/17 1328)  HYDROmorphone (DILAUDID) injection 0.5 mg (0.5 mg Intravenous Given 02/28/17 1427)     Initial Impression / Assessment and Plan / ED Course  I have reviewed the triage vital signs and the nursing notes.  Pertinent labs & imaging results that were available during my care of the patient were reviewed by me and considered in my medical decision  making (see chart for details).     Patient with acute onset shortness of breath, progressively worsening since 4 days ago.  Labs unremarkable, except for mildly elevated WBC, 11, BNP 104.3, AST 54, creatinine 1.11, glucose 221, initial lactic 2.12 which cleared after fluids.  Chest x-ray initially showing significant increase in heart size from January 2018, highly concerning for pericardial effusion.  A CT Angie of the chest was performed which showed no PE, however large pericardial effusion.  Echocardiogram was performed per recommendation by cardiology which showed signs concerning for cardiac tamponade.  Cardiologist, Dr. Marlou Porch evaluated the patient and consulted CT surgery for further intervention. CT surgeon, Dr. Cyndia Bent, evaluated the patient and will take the patient to the OR for pericardial window.   Patient understands and agrees with plan.  Also discussed findings on CT scan of increased lymphadenopathy which metastases cannot be ruled out.  I consulted the internal medicine teaching service who will admit the patient for further evaluation and treatment.  Patient also evaluated by Dr. Billy Fischer who performed a bedside ultrasound.  Final Clinical Impressions(s) / ED Diagnoses   Final diagnoses:  Pericardial effusion  Cardiac tamponade    ED Discharge Orders    None       Frederica Kuster, PA-C 02/28/17 1823    Gareth Morgan, MD 03/02/17 [phone]

## 2017-02-28 NOTE — ED Notes (Signed)
Patient transported to X-ray 

## 2017-02-28 NOTE — ED Notes (Signed)
Pt reports coughing with any movement and not being able to walk from bedroom to bathroom in her home. C/O abd. distention/swelling, pain in upper abdomen/chest, increased fatigue. Hx of stage 4 lung cancer with treatment on every other Wednesdays.

## 2017-02-28 NOTE — Brief Op Note (Signed)
02/28/2017  6:15 PM  PATIENT:  [name]  71 y.o. female  PRE-OPERATIVE DIAGNOSIS:  CHF  POST-OPERATIVE DIAGNOSIS:  CHF  PROCEDURE:  Procedure(s): PERICARDIAL WINDOW (N/A)  SURGEON:  Surgeon(s) and Role:    * Murl Zogg, Fernande Boyden, MD - Primary  PHYSICIAN ASSISTANT: none  ASSISTANTS: Horton Chin, RNFA   ANESTHESIA:   general  EBL:  minimal  BLOOD ADMINISTERED:none  DRAINS: 69 F Chest Tube(s) in the pericardium   1500 cc of thin bloody pericardial effusion.  LOCAL MEDICATIONS USED:  NONE  SPECIMEN:  Source of Specimen:  pericardial fluid for cytology and pericardium for pathology.  DISPOSITION OF SPECIMEN:  PATHOLOGY  COUNTS:  YES  TOURNIQUET:  * No tourniquets in log *  DICTATION: .Note written in EPIC  PLAN OF CARE: Admit to inpatient   PATIENT DISPOSITION:  PACU - hemodynamically stable.   Delay start of Pharmacological VTE agent (>24hrs) due to surgical blood loss or risk of bleeding: yes

## 2017-02-28 NOTE — ED Notes (Signed)
Daughter:  Maudie Mercury, cell  [phone]

## 2017-02-28 NOTE — H&P (Signed)
Date: 02/28/2017               Patient Name:  [name] MRN: [id number]  DOB: 10/01/1945 Age / Sex: 71 y.o., female   PCP: Octavio Graves, DO         Medical Service: Internal Medicine Teaching Service         Attending Physician: Dr. Bartholomew Crews, MD    First Contact: Dr. Tarri Abernethy Pager: [phone]  Second Contact: Dr. Philipp Ovens Pager: [phone]       After Hours (After 5p/  First Contact Pager: [phone]  weekends / holidays): Second Contact Pager: [phone]   Chief Complaint: Shortness of breath   History of Present Illness: [name] is a 71 y.o. Female with a PMHx significant for Stage IV non-small cell lung cancer s/p palliative radiation in March 2018 currently on [place] who presented to the ED with worsening dyspnea on exertion of 4 days duration. She states that she initially had abdominal pain and distention that subsequently developed shortness of breath. Initially it was mild but seems to have progressed to the point where she is now short of breath at rest. Family at bedside states that they wanted her to come in earlier but the patient refused until this AM. She it aware that there is fluid around her heart and states that cardiothoracic surgery has already seen her. She will go for surgery today. She denies any recent illness, fevers/chills, diarrhea, constipation, syncope.   In the ED she was found to be borderline hypotensive and CT of the chest illustrated no PE but did show a large pericardial effusion. Echocardiogram was preformed that demonstrated a severe pericardial effusion with associated thrombus and signs of cardiac tamponade. Cardiology and cardiothoracic surgery were consulted from the ED.   Meds:  Current Facility-Administered Medications for the 02/28/17 encounter [facility] Encounter)  Medication  . 0.9 %  sodium chloride infusion   Current Meds  Medication Sig  . Oxycodone HCl 20 MG TABS Take 20-40 mg every 4 (four) hours  as needed by mouth for pain.   Allergies: Allergies as of 02/28/2017 - Review Complete 02/28/2017  Allergen Reaction Noted  . Morphine and related Nausea And Vomiting and Other (See Comments) 04/13/2011  . Tape Rash 05/06/2016   Past Medical History:  Diagnosis Date  . Asthma   . Bleeding nose   . Cancer ([place]) 2017   Lung   . Diabetes mellitus   . Diverticulitis   . H. pylori infection   . Hypertension   . Lung nodule 2017   Both lungs[facility] at [facility].   . Parkinson disease ([place]) 2010  . Tubular adenoma of colon 05/2007   Family History:  Family: + DM  Social History:  Denies the use of EtOH, tobacco, or drugs   Review of Systems: A complete ROS was negative except as per HPI.   Physical Exam: Blood pressure 112/90, pulse (!) 106, temperature 97.6 F (36.4 C), temperature source Oral, resp. rate (!) 23, SpO2 93 %.  General: Obese female in moderate distress HENT: Normocephalic, atraumatic, moist mucus membranes  Pulm: Difficult speaking in full sentences, appears to be in mild respiratory distress, no wheezing or crackles heard  CV: Distant heart sounds, no pulsus paradoxus appreciated, no JVD Abdomen: Active bowel sounds, distended, no tenderness to palpation  Extremities: Trace LE edema, dry and warm, pulses palpable in all extremities  Skin: No new rashes or lesions  Neuro: Alert and oriented  CT Chest / Ct Abdomen  1. No evidence of pulmonary embolism. 2. Large pericardial effusion. Consider echocardiogram to exclude cardiac tamponade, as clinically warranted. 3. Mediastinal lymphadenopathy, as described above, with overall mixed response. Nodal metastases remain possible. 4. Small bilateral pleural effusions, right greater than left. Small volume abdominopelvic ascites. 5. No evidence of metastatic disease in the abdomen/pelvis.  Transthoracic Echocardiogram  - Left ventricle: The cavity size was normal. There was mild   concentric hypertrophy. Systolic  function was vigorous. The   estimated ejection fraction was in the range of 65% to 70%. Wall   motion was normal; there were no regional wall motion   abnormalities. - Aortic valve: Transvalvular velocity was within the normal range.   There was no stenosis. There was no regurgitation. - Mitral valve: Transvalvular velocity was within the normal range.   There was no evidence for stenosis. There was no regurgitation. - Right ventricle: The cavity size was normal. Wall thickness was   normal. Systolic function was normal. - Tricuspid valve: There was no regurgitation. - Pericardium, extracardiac: A large pericardial effusion was   identified circumferential to the heart. Measuring up to 4.6 cm.   The effusion appears loculated with a large area of what appears   to be clot (10cm x 3.0 cm) adjacent to the RV free wall and apex.   There was right ventricular chamber collapse. There was evidence   for increased RV-LV interaction demonstrated by respirophasic   changes in transmitral velocities. Features were consistent with   tamponade physiology.  Assessment & Plan by Problem: Active Problems:   Tamponade  1. Pericardial Effusion. [name] is a 71 y.o. Female with a PMHx significant for Stage IV non-small cell lung cancer s/p palliative radiation in March 2018 currently on Ipilumumab & Nivolumab who presented signs and symptoms of pericardial effusion. Given that she has known metastatic lung cancer this is likely a malignant pericardial effusion. She has hypotension, JVD (appreciated by cardiology), and sings of tamponade on echocardiogram. Cardiothoracic surgery will perform a pericardial window today. Also on the ddx but less likely include causes of pericarditis (including medications, viral, bacterial, fungal, MI, previous radiation, and autoimmune) - Cardiothoracic surgery to perform a pericardial window   2. Stage IV Non-small cell lung cancer  - Bone metastases treated with  radiation therapy in March  - Follows with [facility], clinical trials with Ipilumumab & Nivolumab - Continue pain control with fentanyl transdermal patch, 100 mcg every 72 hours   3. Diabetes Mellitus  - Moderate SSI q 4 hours  - Will add additional coverage once able to eat   4. Hypertension  - Monitor   5. Parkinsons  - Continue Carbidopa-Levodopa   Diet: NPO VTE ppx: SCDs Code Status: DNR  Dispo: Admit patient to Inpatient with expected length of stay greater than 2 midnights.  Signed: Ina Homes, MD 02/28/2017, 4:53 PM  My Pager: [phone]

## 2017-02-28 NOTE — Consult Note (Signed)
[address]       [place] [address]             [phone]     Cardiothoracic Surgery Consultation   Reason for Consult: large pericardial effusion with tamponade Referring Physician: Dr. Skeet Latch    [name] is an 71 y.o. female.  HPI:   The patient is a 71 year old woman with stage IV RUL non-small cell lung cancer treated at [facility] on current immunotherapy, diabetes, hyperlipidemia who was seen within the past couple of weeks at [facility] and told that she had some pericardial effusion but no treatment needed. She now presents with a several day history of progressive shortness of breath, cough, upper abdominal pain and abdominal distension. She had a CT of the chest today which showed no PE but did show a large pericardial effusion and small bilateral pleural effusions. There is a 14 mm subcarinal lymph node that was previously 3 cm and a 2.2 cm right paratracheal node that was previously 1.7 cm. She had an echo in the ER showing a large circumferential pericardial effusion with some fibrin stranding and some RV collapse and features consistent with tamponade. LVEF 65-70%. Cardiac valves look ok.  Past Medical History:  Diagnosis Date  . Asthma   . Bleeding nose   . Cancer ([place]) 2017   Lung   . Diabetes mellitus   . Diverticulitis   . H. pylori infection   . Hypertension   . Lung nodule 2017   Both lungs-pt at [facility].   . Parkinson disease ([place]) 2010  . Tubular adenoma of colon 05/2007    Past Surgical History:  Procedure Laterality Date  . APPENDECTOMY    . CHOLECYSTECTOMY    . COLON SURGERY     colectomy and reversal after diverticular ds  . PARTIAL COLECTOMY    . TONSILLECTOMY    . TOTAL KNEE ARTHROPLASTY     right  . VAGINAL HYSTERECTOMY      Family History  Problem Relation Age of Onset  . Diabetes Mellitus II Sister   . Diabetes Mellitus II Son   . Colon cancer Neg Hx   Strong family history of lung cancer  Social  History:  reports that she quit smoking about 29 years ago. she has never used smokeless tobacco. She reports that she does not drink alcohol or use drugs.  Allergies:  Allergies  Allergen Reactions  . Morphine And Related Nausea And Vomiting and Other (See Comments)    Headache (also)  . Tape Rash    Can only tolerate for short amounts of time (CANNOT BE "LEFT" ON THE SAME SITE FOR LONG)    Medications: I have reviewed the patient's current medications. Prior to Admission:  (Not in a hospital admission) Scheduled:  Continuous:  PRN:  Results for orders placed or performed during the hospital encounter of 02/28/17 (from the past 48 hour(s))  Basic metabolic panel     Status: Abnormal   Collection Time: 02/28/17 10:05 AM  Result Value Ref Range   Sodium 136 135 - 145 mmol/L   Potassium 3.9 3.5 - 5.1 mmol/L   Chloride 105 101 - 111 mmol/L   CO2 21 (L) 22 - 32 mmol/L   Glucose, Bld 221 (H) 65 - 99 mg/dL   BUN 16 6 - 20 mg/dL   Creatinine, Ser 1.11 (H) 0.44 - 1.00 mg/dL   Calcium 8.7 (L) 8.9 - 10.3 mg/dL  GFR calc non Af Amer 49 (L) >60 mL/min   GFR calc Af Amer 57 (L) >60 mL/min    Comment: (NOTE) The eGFR has been calculated using the CKD EPI equation. This calculation has not been validated in all clinical situations. eGFR's persistently <60 mL/min signify possible Chronic Kidney Disease.    Anion gap 10 5 - 15  CBC     Status: Abnormal   Collection Time: 02/28/17 10:05 AM  Result Value Ref Range   WBC 11.0 (H) 4.0 - 10.5 K/uL   RBC 4.80 3.87 - 5.11 MIL/uL   Hemoglobin 12.7 12.0 - 15.0 g/dL   HCT 40.1 36.0 - 46.0 %   MCV 83.5 78.0 - 100.0 fL   MCH 26.5 26.0 - 34.0 pg   MCHC 31.7 30.0 - 36.0 g/dL   RDW 14.5 11.5 - 15.5 %   Platelets 302 150 - 400 K/uL  Brain natriuretic peptide     Status: Abnormal   Collection Time: 02/28/17 10:05 AM  Result Value Ref Range   B Natriuretic Peptide 104.3 (H) 0.0 - 100.0 pg/mL  Hepatic function panel     Status: Abnormal    Collection Time: 02/28/17 10:05 AM  Result Value Ref Range   Total Protein 6.9 6.5 - 8.1 g/dL   Albumin 3.1 (L) 3.5 - 5.0 g/dL   AST 54 (H) 15 - 41 U/L   ALT 37 14 - 54 U/L   Alkaline Phosphatase 96 38 - 126 U/L   Total Bilirubin 0.6 0.3 - 1.2 mg/dL   Bilirubin, Direct 0.1 0.1 - 0.5 mg/dL   Indirect Bilirubin 0.5 0.3 - 0.9 mg/dL  I-stat troponin, ED     Status: None   Collection Time: 02/28/17 10:17 AM  Result Value Ref Range   Troponin i, poc 0.01 0.00 - 0.08 ng/mL   Comment 3            Comment: Due to the release kinetics of cTnI, a negative result within the first hours of the onset of symptoms does not rule out myocardial infarction with certainty. If myocardial infarction is still suspected, repeat the test at appropriate intervals.   CBG monitoring, ED     Status: Abnormal   Collection Time: 02/28/17 10:38 AM  Result Value Ref Range   Glucose-Capillary 222 (H) 65 - 99 mg/dL  I-Stat CG4 Lactic Acid, ED     Status: Abnormal   Collection Time: 02/28/17 11:48 AM  Result Value Ref Range   Lactic Acid, Venous 2.12 (HH) 0.5 - 1.9 mmol/L   Comment NOTIFIED PHYSICIAN   I-Stat CG4 Lactic Acid, ED     Status: None   Collection Time: 02/28/17  2:43 PM  Result Value Ref Range   Lactic Acid, Venous 1.82 0.5 - 1.9 mmol/L    Dg Chest 2 View  Result Date: 02/28/2017 CLINICAL DATA:  WORSENING CHEST PAIN,SOB.PAST FEW DAYS.HX LUNG CA EXAM: CHEST  2 VIEW COMPARISON:  Chest x-ray dated 05/06/2016. FINDINGS: Marked cardiomegaly, heart size significantly increased compared to the previous study. Chronic mild interstitial thickening throughout both lungs. No pleural effusion or pneumothorax seen. Osseous structures about the chest are unremarkable. Mild degenerative spurring again noted within the thoracic spine. IMPRESSION: 1. Marked cardiomegaly. Heart size has significantly increased compared to the previous study of 05/06/2016. Findings highly suspicious for pericardial effusion. Recommend  correlation with ECHO and/or chest CT angiogram. 2. Stable chronic interstitial prominence within each lung. No new lung findings seen. No evidence of pneumonia or overt  alveolar pulmonary edema. These results were called by telephone at the time of interpretation on 02/28/2017 at 11:18 am to Dr. Gareth Morgan , who verbally acknowledged these results. Electronically Signed   By: Franki Cabot M.D.   On: 02/28/2017 11:20   Ct Angio Chest Pe W And/or Wo Contrast  Result Date: 02/28/2017 CLINICAL DATA:  Chest pain, fatigue, stage IV lung cancer. Abdominal distention/pain. EXAM: CT ANGIOGRAPHY CHEST CT ABDOMEN AND PELVIS WITH CONTRAST TECHNIQUE: Multidetector CT imaging of the chest was performed using the standard protocol during bolus administration of intravenous contrast. Multiplanar CT image reconstructions and MIPs were obtained to evaluate the vascular anatomy. Multidetector CT imaging of the abdomen and pelvis was performed using the standard protocol during bolus administration of intravenous contrast. CONTRAST:  100 mL Isovue 370 IV COMPARISON:  CTA chest dated 05/06/2016. CT abdomen/ pelvis dated 04/14/2016. FINDINGS: CTA CHEST FINDINGS Cardiovascular: Satisfactory opacification of the bilateral pulmonary arteries to the lobar level. No evidence of pulmonary embolism. Large pericardial effusion, new. No evidence of thoracic aortic aneurysm. Atherosclerotic calcifications of the aortic arch. Mediastinum/Nodes: 14 mm subcarinal node, previously 3.0 cm. Suspected 2.2 cm right paratracheal node (series 5/ image 37), poorly visualized, previously 1.7 cm. Visualized thyroid is unremarkable. Lungs/Pleura: Evaluation of the lung parenchyma is constrained by respiratory motion. Within that constraint, there are no suspicious pulmonary nodules. Mild paraseptal emphysematous changes, upper lobe predominant. Small bilateral pleural effusions, right greater than left. No pneumothorax. Musculoskeletal: Degenerative  changes of the visualized thoracolumbar spine. Review of the MIP images confirms the above findings. CT ABDOMEN and PELVIS FINDINGS Hepatobiliary: Liver is within normal limits. Status post cholecystectomy. No intrahepatic or extrahepatic ductal dilatation. Pancreas: Within normal limits. Spleen: Normal in size. Adrenals/Urinary Tract: Adrenal glands are within normal limits. Kidneys are within normal limits.  No hydronephrosis. Bladder is underdistended but unremarkable. Stomach/Bowel: Stomach is within normal limits. No evidence of bowel obstruction. Appendix is not discretely visualized. Postsurgical changes related to left colonic resection with suture line in the left lower abdomen (series 12/ image 66). Vascular/Lymphatic: No evidence of abdominal aortic aneurysm. Atherosclerotic calcifications of the abdominal aorta and branch vessels. No suspicious abdominopelvic lymphadenopathy. Reproductive: Status post hysterectomy. No adnexal masses. Other: Small volume perihepatic and pelvic ascites. Postsurgical changes along the anterior abdominal wall with tiny midline fat containing ventral hernias. Musculoskeletal: Degenerative changes of the visualized thoracolumbar spine. Dominant superior endplate Schmorl's node involving L4. Review of the MIP images confirms the above findings. IMPRESSION: No evidence of pulmonary embolism. Large pericardial effusion. Consider echocardiogram to exclude cardiac tamponade, as clinically warranted. Mediastinal lymphadenopathy, as described above, with overall mixed response. Nodal metastases remain possible. Small bilateral pleural effusions, right greater than left. Small volume abdominopelvic ascites. No evidence of metastatic disease in the abdomen/pelvis. Electronically Signed   By: Julian Hy M.D.   On: 02/28/2017 14:09   Ct Abdomen Pelvis W Contrast  Result Date: 02/28/2017 CLINICAL DATA:  Chest pain, fatigue, stage IV lung cancer. Abdominal distention/pain. EXAM:  CT ANGIOGRAPHY CHEST CT ABDOMEN AND PELVIS WITH CONTRAST TECHNIQUE: Multidetector CT imaging of the chest was performed using the standard protocol during bolus administration of intravenous contrast. Multiplanar CT image reconstructions and MIPs were obtained to evaluate the vascular anatomy. Multidetector CT imaging of the abdomen and pelvis was performed using the standard protocol during bolus administration of intravenous contrast. CONTRAST:  100 mL Isovue 370 IV COMPARISON:  CTA chest dated 05/06/2016. CT abdomen/ pelvis dated 04/14/2016. FINDINGS: CTA CHEST FINDINGS Cardiovascular: Satisfactory opacification  of the bilateral pulmonary arteries to the lobar level. No evidence of pulmonary embolism. Large pericardial effusion, new. No evidence of thoracic aortic aneurysm. Atherosclerotic calcifications of the aortic arch. Mediastinum/Nodes: 14 mm subcarinal node, previously 3.0 cm. Suspected 2.2 cm right paratracheal node (series 5/ image 37), poorly visualized, previously 1.7 cm. Visualized thyroid is unremarkable. Lungs/Pleura: Evaluation of the lung parenchyma is constrained by respiratory motion. Within that constraint, there are no suspicious pulmonary nodules. Mild paraseptal emphysematous changes, upper lobe predominant. Small bilateral pleural effusions, right greater than left. No pneumothorax. Musculoskeletal: Degenerative changes of the visualized thoracolumbar spine. Review of the MIP images confirms the above findings. CT ABDOMEN and PELVIS FINDINGS Hepatobiliary: Liver is within normal limits. Status post cholecystectomy. No intrahepatic or extrahepatic ductal dilatation. Pancreas: Within normal limits. Spleen: Normal in size. Adrenals/Urinary Tract: Adrenal glands are within normal limits. Kidneys are within normal limits.  No hydronephrosis. Bladder is underdistended but unremarkable. Stomach/Bowel: Stomach is within normal limits. No evidence of bowel obstruction. Appendix is not discretely  visualized. Postsurgical changes related to left colonic resection with suture line in the left lower abdomen (series 12/ image 66). Vascular/Lymphatic: No evidence of abdominal aortic aneurysm. Atherosclerotic calcifications of the abdominal aorta and branch vessels. No suspicious abdominopelvic lymphadenopathy. Reproductive: Status post hysterectomy. No adnexal masses. Other: Small volume perihepatic and pelvic ascites. Postsurgical changes along the anterior abdominal wall with tiny midline fat containing ventral hernias. Musculoskeletal: Degenerative changes of the visualized thoracolumbar spine. Dominant superior endplate Schmorl's node involving L4. Review of the MIP images confirms the above findings. IMPRESSION: No evidence of pulmonary embolism. Large pericardial effusion. Consider echocardiogram to exclude cardiac tamponade, as clinically warranted. Mediastinal lymphadenopathy, as described above, with overall mixed response. Nodal metastases remain possible. Small bilateral pleural effusions, right greater than left. Small volume abdominopelvic ascites. No evidence of metastatic disease in the abdomen/pelvis. Electronically Signed   By: Julian Hy M.D.   On: 02/28/2017 14:09    Review of Systems  Constitutional: Positive for malaise/fatigue. Negative for chills, fever and weight loss.  Eyes: Negative.   Respiratory: Positive for cough and shortness of breath. Negative for sputum production.   Cardiovascular: Positive for chest pain, orthopnea and leg swelling.  Gastrointestinal: Positive for abdominal pain and nausea.  Genitourinary: Negative.   Musculoskeletal: Negative.   Neurological: Negative for dizziness and loss of consciousness.  Endo/Heme/Allergies: Negative.   Psychiatric/Behavioral: Positive for depression.   Blood pressure 112/90, pulse (!) 106, temperature 97.6 F (36.4 C), temperature source Oral, resp. rate (!) 23, SpO2 93 %. Physical Exam  Constitutional: She is  oriented to person, place, and time.  Obese woman in no distress  HENT:  Head: Normocephalic and atraumatic.  Mouth/Throat: Oropharynx is clear and moist.  Eyes: Conjunctivae and EOM are normal. Pupils are equal, round, and reactive to light.  Neck: Normal range of motion. Neck supple.  Cardiovascular: Normal rate, regular rhythm and intact distal pulses.  Respiratory: Breath sounds normal. She is in respiratory distress.  GI: Soft. Bowel sounds are normal. There is no tenderness.  Musculoskeletal: Normal range of motion. She exhibits no edema.  Lymphadenopathy:    She has no cervical adenopathy.  Neurological: She is alert and oriented to person, place, and time.  Skin: Skin is warm and dry.  Psychiatric: She has a normal mood and affect.   [id number]  [phone]  ------------------------------------------------------------------- Transthoracic Echocardiography  (Report amended )  Patient:    Viveka, Wilmeth MR #:       [id number] Study Date: 02/28/2017 Gender:     F Age:        27 Height:     162.6 cm Weight:     117.9 kg BSA:        2.37 m^2 Pt. Status: Room:   ATTENDING    Schlossman, Harriett Sine, Erin  REFERRING    Gareth Morgan  PERFORMING   [facility], Inpatient  SONOGRAPHER  Roseanna Rainbow  cc:  ------------------------------------------------------------------- LV EF: 65% -   70%  ------------------------------------------------------------------- Indications:      Pericardial effusion 423.9.  ------------------------------------------------------------------- History:   PMH:   Dyspnea.  Angina pectoris.  Risk factors: Hypertension. Diabetes mellitus. Dyslipidemia.  ------------------------------------------------------------------- Study Conclusions  - Left ventricle: The cavity size was normal. There was mild   concentric hypertrophy. Systolic function was vigorous. The   estimated ejection fraction  was in the range of 65% to 70%. Wall   motion was normal; there were no regional wall motion   abnormalities. - Aortic valve: Transvalvular velocity was within the normal range.   There was no stenosis. There was no regurgitation. - Mitral valve: Transvalvular velocity was within the normal range.   There was no evidence for stenosis. There was no regurgitation. - Right ventricle: The cavity size was normal. Wall thickness was   normal. Systolic function was normal. - Tricuspid valve: There was no regurgitation. - Pericardium, extracardiac: A large pericardial effusion was   identified circumferential to the heart. Measuring up to 4.6 cm.   The effusion appears loculated with a large area of what appears   to be clot (10cm x 3.0 cm) adjacent to the RV free wall and apex.   There was right ventricular chamber collapse. There was evidence   for increased RV-LV interaction demonstrated by respirophasic   changes in transmitral velocities. Features were consistent with   tamponade physiology.  Recommendations:  Cardiothoracic surgery consultation. This was related to the inpatient cardiology service.  ------------------------------------------------------------------- Study data:  No prior study was available for comparison.  Study status:  STAT.  Procedure:  Transthoracic echocardiography. Image quality was adequate.  Study completion:  There were no complications.          Transthoracic echocardiography.  M-mode, complete 2D, spectral Doppler, and color Doppler.  Birthdate: Patient birthdate: 02-14-1946.  Age:  Patient is 71 yr old.  Sex: Gender: female.    BMI: 44.6 kg/m^2.  Blood pressure:     100/73 Patient status:  Inpatient.  Study date:  Study date: 02/28/2017. Study time: 02:15 PM.  Location:  Emergency department.  -------------------------------------------------------------------  ------------------------------------------------------------------- Left ventricle:  The  cavity size was normal. There was mild concentric hypertrophy. Systolic function was vigorous. The estimated ejection fraction was in the range of 65% to 70%. Wall motion was normal; there were no regional wall motion abnormalities.  ------------------------------------------------------------------- Aortic valve:   Trileaflet; mildly thickened, mildly calcified leaflets. Mobility was not restricted.  Doppler:  Transvalvular velocity was within the normal range. There was no stenosis. There was no regurgitation.  ------------------------------------------------------------------- Aorta:  Aortic root: The aortic root was normal in size.  ------------------------------------------------------------------- Mitral valve:   Structurally normal valve.   Mobility was not restricted.  Doppler:  Transvalvular velocity was within the normal range. There was no evidence for stenosis. There was no regurgitation.  ------------------------------------------------------------------- Left  atrium:  The atrium was normal in size.  ------------------------------------------------------------------- Right ventricle:  The cavity size was normal. Wall thickness was normal. Systolic function was normal.  ------------------------------------------------------------------- Pulmonic valve:    Structurally normal valve.   Cusp separation was normal.  Doppler:  Transvalvular velocity was within the normal range. There was no evidence for stenosis. There was no regurgitation.  ------------------------------------------------------------------- Tricuspid valve:   Structurally normal valve.    Doppler: Transvalvular velocity was within the normal range. There was no regurgitation.  ------------------------------------------------------------------- Pulmonary artery:   The main pulmonary artery was normal-sized. Systolic pressure could not be accurately  estimated.  ------------------------------------------------------------------- Right atrium:  The atrium was normal in size.  ------------------------------------------------------------------- Pericardium:  A large pericardial effusion was identified circumferential to the heart. Measuring up to 4.6 cm. The effusion appears loculated with a large area of what appears to be clot (10cm x 3.0 cm) adjacent to the RV free wall and apex.  Doppler: There was right ventricular chamber collapse. There was evidence for increased RV-LV interaction demonstrated by respirophasic changes in transmitral velocities. Features were consistent with tamponade physiology.  ------------------------------------------------------------------- Systemic veins: Inferior vena cava: The vessel was dilated. The respirophasic diameter changes were blunted (< 50%), consistent with elevated central venous pressure.  ------------------------------------------------------------------- Measurements   Left ventricle                         Value        Reference  LV ID, ED, PLAX chordal        (L)     33    mm     43 - 52  LV ID, ES, PLAX chordal        (L)     20    mm     23 - 38  LV fx shortening, PLAX chordal         39    %      >=29  LV PW thickness, ED                    12    mm     ---------  IVS/LV PW ratio, ED                    1            <=1.3    Ventricular septum                     Value        Reference  IVS thickness, ED                      12    mm     ---------    LVOT                                   Value        Reference  LVOT ID, S                             18    mm     ---------  LVOT area  2.54  cm^2   ---------    Aorta                                  Value        Reference  Aortic root ID, ED                     30    mm     ---------  Ascending aorta ID, A-P, S             31    mm     ---------    Left atrium                             Value        Reference  LA ID, A-P, ES                         23    mm     ---------  LA ID/bsa, A-P                         0.97  cm/m^2 <=2.2    Systemic veins                         Value        Reference  Estimated CVP                          15    mm Hg  ---------  Legend: (L)  and  (H)  mark values outside specified reference range.  ------------------------------------------------------------------- Neena Rhymes, MD 2018-11-07T14:58:23 Assessment/Plan:   This patient has stage IV lung cancer with a progressively increasing pericardial effusion now presenting with symptoms and signs of tamponade. I have personally reviewed and interpreted her echo and CT images. This is highly suspicious for a malignant pericardial effusion. She has low normal SBP of 100, resting tachycardial 110, lactic acid of 2.12 on presentation. This will require emergent drainage in the OR by subxyphoid pericardial window. I discussed the procedure with the patient and her family including alternatives, benefits and risks including but not limited to bleeding, blood transfusion, infection, injury to the heart, recurrence of the effusion and she understands and agrees to proceed.   I spent 60 minutes performing this consultation and > 50% of this time was spent face to face counseling and coordinating the care of this patient's pericardial effusion and tamponade.  [name] 02/28/2017, 3:59 PM

## 2017-02-28 NOTE — Progress Notes (Signed)
Patient ID: [name], female   DOB: [birth year], 71 y.o.   MRN: [id number] [facility] Evening Rounds  Hemodynamically stable.  Chest tube output low  Urine output ok

## 2017-03-01 ENCOUNTER — Other Ambulatory Visit: Payer: Self-pay

## 2017-03-01 ENCOUNTER — Encounter (HOSPITAL_COMMUNITY): Payer: Self-pay | Admitting: Surgery

## 2017-03-01 LAB — COMPREHENSIVE METABOLIC PANEL
ALT: 12 U/L — AB (ref 14–54)
AST: 34 U/L (ref 15–41)
Albumin: 2.4 g/dL — ABNORMAL LOW (ref 3.5–5.0)
Alkaline Phosphatase: 78 U/L (ref 38–126)
Anion gap: 4 — ABNORMAL LOW (ref 5–15)
BUN: 10 mg/dL (ref 6–20)
CO2: 27 mmol/L (ref 22–32)
CREATININE: 0.89 mg/dL (ref 0.44–1.00)
Calcium: 7.9 mg/dL — ABNORMAL LOW (ref 8.9–10.3)
Chloride: 109 mmol/L (ref 101–111)
GFR calc Af Amer: >60 mL/min (ref >60)
Glucose, Bld: 81 mg/dL (ref 65–99)
Potassium: 3.5 mmol/L (ref 3.5–5.1)
Sodium: 140 mmol/L (ref 135–145)
Total Bilirubin: 0.6 mg/dL (ref 0.3–1.2)
Total Protein: 5.3 g/dL — ABNORMAL LOW (ref 6.5–8.1)

## 2017-03-01 LAB — CBC
HEMATOCRIT: 33.6 % — AB (ref 36.0–46.0)
Hemoglobin: 10.7 g/dL — ABNORMAL LOW (ref 12.0–15.0)
MCH: 26.9 pg (ref 26.0–34.0)
MCHC: 31.8 g/dL (ref 30.0–36.0)
MCV: 84.4 fL (ref 78.0–100.0)
PLATELETS: 209 10*3/uL (ref 150–400)
RBC: 3.98 MIL/uL (ref 3.87–5.11)
RDW: 14.8 % (ref 11.5–15.5)
WBC: 10.9 10*3/uL — AB (ref 4.0–10.5)

## 2017-03-01 LAB — GLUCOSE, CAPILLARY
GLUCOSE-CAPILLARY: 69 mg/dL (ref 65–99)
GLUCOSE-CAPILLARY: 79 mg/dL (ref 65–99)
GLUCOSE-CAPILLARY: 297 mg/dL — AB (ref 65–99)
GLUCOSE-CAPILLARY: 367 mg/dL — AB (ref 65–99)
Glucose-Capillary: 116 mg/dL — ABNORMAL HIGH (ref 65–99)
Glucose-Capillary: 187 mg/dL — ABNORMAL HIGH (ref 65–99)

## 2017-03-01 LAB — MRSA PCR SCREENING: MRSA by PCR: NEGATIVE

## 2017-03-01 MED ORDER — INSULIN GLARGINE 100 UNIT/ML ~~LOC~~ SOLN
80.0000 [IU] | Freq: Every day | SUBCUTANEOUS | Status: DC
  Administered 2017-03-01 – 2017-03-02 (×2): 80 [IU] via SUBCUTANEOUS
  Filled 2017-03-01 (×2): qty 0.8

## 2017-03-01 MED ORDER — ENOXAPARIN SODIUM 40 MG/0.4ML ~~LOC~~ SOLN
40.0000 mg | SUBCUTANEOUS | Status: DC
  Administered 2017-03-01 – 2017-03-05 (×5): 40 mg via SUBCUTANEOUS
  Filled 2017-03-01 (×5): qty 0.4

## 2017-03-01 MED ORDER — ORAL CARE MOUTH RINSE
15.0000 mL | Freq: Two times a day (BID) | OROMUCOSAL | Status: DC
Start: 2017-03-01 — End: 2017-03-05
  Administered 2017-03-01 – 2017-03-02 (×3): 15 mL via OROMUCOSAL

## 2017-03-01 MED ORDER — INSULIN ASPART 100 UNIT/ML ~~LOC~~ SOLN
0.0000 [IU] | Freq: Three times a day (TID) | SUBCUTANEOUS | Status: DC
  Administered 2017-03-01: 8 [IU] via SUBCUTANEOUS
  Administered 2017-03-01 – 2017-03-03 (×5): 3 [IU] via SUBCUTANEOUS
  Administered 2017-03-03: 2 [IU] via SUBCUTANEOUS
  Administered 2017-03-04: 3 [IU] via SUBCUTANEOUS
  Administered 2017-03-05: 11 [IU] via SUBCUTANEOUS

## 2017-03-01 NOTE — Anesthesia Preprocedure Evaluation (Signed)
Anesthesia Evaluation  Patient identified by MRN, date of birth, ID band Patient awake    Reviewed: Allergy & Precautions, NPO status , Patient's Chart, lab work & pertinent test results  Airway Mallampati: III  TM Distance: >3 FB     Dental  (+) Teeth Intact   Pulmonary former smoker,     + decreased breath sounds      Cardiovascular hypertension,  Rhythm:Regular Rate:Normal     Neuro/Psych    GI/Hepatic   Endo/Other  diabetes  Renal/GU      Musculoskeletal   Abdominal   Peds  Hematology   Anesthesia Other Findings   Reproductive/Obstetrics                             Anesthesia Physical Anesthesia Plan  ASA: IV and emergent  Anesthesia Plan: General   Post-op Pain Management:    Induction: Intravenous  PONV Risk Score and Plan: Ondansetron and Dexamethasone  Airway Management Planned: Oral ETT  Additional Equipment: CVP and Arterial line  Intra-op Plan:   Post-operative Plan: Extubation in OR  Informed Consent: I have reviewed the patients History and Physical, chart, labs and discussed the procedure including the risks, benefits and alternatives for the proposed anesthesia with the patient or authorized representative who has indicated his/her understanding and acceptance.   Dental advisory given  Plan Discussed with: CRNA and Anesthesiologist  Anesthesia Plan Comments:         Anesthesia Quick Evaluation

## 2017-03-01 NOTE — Discharge Instructions (Signed)
Pericardial Effusion Pericardial effusion is a buildup of fluid around your heart. The heart is surrounded by a double-layered sac (pericardium). This sac normally contains a small amount of fluid. When too much builds up, it can put pressure on your heart and cause problems. As fluid builds up in the pericardial sac and pressure on your heart increases, it becomes harder for your heart to pump blood. When fluid prevents your heart from pumping enough blood, it is called cardiac tamponade. Cardiac tamponade is a life-threatening condition. What are the causes? Often the cause of pericardial effusion is not known (idiopathic effusion). Possible causes are from:  Infections, such as from a virus, bacteria, fungus, or parasite.  Damage to the pericardium from heart surgery or a heart attack.  Inflammatory diseases, such as rheumatoid arthritis or lupus.  Kidney disease.  Thyroid disease.  Cancer.  Cancer treatment, including radiation or chemotherapy.  Certain drugs, including tuberculosis drugs or seizure drugs.  Chest trauma.  What are the signs or symptoms? Pericardial effusion may not cause symptoms at first, especially if the fluid builds up slowly. In time, pressure on the heart may cause:  Chest pain.  Trouble breathing.  Pain and shortness of breath that is worse when lying down.  Dizziness.  Fainting.  Cough.  Hiccups.  Skipped heartbeats (palpitations).  Anxiety and confusion.  A bluish skin color (cyanosis).  Swollen legs and ankles.  How is this diagnosed? Your health care provider may suspect pericardial effusion based on your symptoms. Your health care provider may also do a physical exam to check for:  Low blood pressure and weak pulses.  Soft (muffled) heart sounds.  Rapid heartbeat.  Full veins in your neck (distended jugular veins).  Decreased breathing sounds and a rubbing sound (friction rub) when listening to your lungs.  Your health care  provider may also do several tests to confirm the diagnosis and find out what is causing the pericardial effusion. These may include:  Chest X-ray.  Imaging study of the heart using sound waves (echocardiogram).  CT scan or MRI.  Electrical study of the heart (electrocardiogram [ECG]).  A procedure using a needle to remove fluid from the pericardium for examination (pericardiocentesis).  Blood tests to check for: ? Infection. ? Heart damage. ? Thyroid abnormalities. ? Kidney disease. ? Inflammatory disorders.  How is this treated? Treatment for pericardial effusion depends on the cause of your symptoms and how severe your symptoms are. If a specific cause was found, that cause will be treated. Treatment may include:  Medicines, such as: ? Nonsteroidal anti-inflammatory drugs (NSAIDs). ? Other anti-inflammatory drugs, such as steroids. ? Antibiotic medicine. ? Antifungal medicine.  Hospital treatment may be necessary, such as for cardiac tamponade. This may include: ? Intravenous (IV) fluids. ? Breathing support.  Surgery may be needed in severe cases. Surgery may include: ? Pericardiocentesis. ? Open heart surgery. ? A procedure to make a permanent opening in the pericardium (pericardial window).  Contact a health care provider if:  You feel dizzy or light-headed.  You have swelling in your legs or ankles.  You have heart palpitations.  You have persistent cough or hiccups. Get help right away if:  You faint.  You have chest pain.  You have trouble breathing. These symptoms may represent a serious problem that is an emergency. Do not wait to see if the symptoms will go away. Get medical help right away. Call your local emergency services (911 in the [place] Do not drive yourself to  the hospital. This information is not intended to replace advice given to you by your health care provider. Make sure you discuss any questions you have with your health care  provider. Document Released: 12/06/2004 Document Revised: 09/16/2015 Document Reviewed: 08/28/2013 Elsevier Interactive Patient Education  2017 [employer].

## 2017-03-01 NOTE — Care Management Note (Signed)
Case Management Note Marvetta Gibbons RN, BSN Unit 4E-Case Manager-- [place] coverage [phone]  Patient Details  Name: [name] MRN: [id number] Date of Birth: [birth year]  Subjective/Objective:  Pt admitted with tamponade- pericardial effusion- s/p pericardial window 02/28/17                  Action/Plan: PTA pt lived at home- CM to follow for d/c needs  Expected Discharge Date:  03/05/17               Expected Discharge Plan:     In-House Referral:     Discharge planning Services  CM Consult  Post Acute Care Choice:    Choice offered to:     DME Arranged:    DME Agency:     HH Arranged:    [place] Agency:     Status of Service:  In process, will continue to follow  If discussed at Long Length of Stay Meetings, dates discussed:    Discharge Disposition:   Additional Comments:  Dawayne Patricia, RN 03/01/2017, 11:27 AM

## 2017-03-01 NOTE — Anesthesia Postprocedure Evaluation (Signed)
Anesthesia Post Note  Patient: [name]  Procedure(s) Performed: PERICARDIAL WINDOW (N/A )     Anesthesia Type: General Level of consciousness: awake, awake and alert and oriented Pain management: pain level controlled Vital Signs Assessment: post-procedure vital signs reviewed and stable Respiratory status: spontaneous breathing and respiratory function stable Cardiovascular status: blood pressure returned to baseline Anesthetic complications: no    Last Vitals:  Vitals:   03/01/17 1600 03/01/17 1800  BP: (!) 113/50 (!) 101/54  Pulse: 87 86  Resp: 15 19  Temp:    SpO2: 94% 96%    Last Pain:  Vitals:   03/01/17 1653  TempSrc:   PainSc: 5                  Patrisia Faeth COKER

## 2017-03-01 NOTE — Progress Notes (Signed)
Patient placed on CPAP of 10 cmH2O per patient home setting via a nasal mask. Patient has 3 L bled in of O2.  Patient stated that the pressure is comparable to what she wears at home.     03/01/17 2211  BiPAP/CPAP/SIPAP  BiPAP/CPAP/SIPAP Pt Type Adult  Mask Type Nasal mask  Respiratory Rate 15 breaths/min  EPAP 10 cmH2O  Flow Rate 3 lpm  BiPAP/CPAP/SIPAP CPAP  Patient Home Equipment No  Auto Titrate No  BiPAP/CPAP /SiPAP Vitals  Pulse Rate 89  Resp 15  SpO2 91 %  Bilateral Breath Sounds Clear;Diminished

## 2017-03-01 NOTE — Progress Notes (Signed)
Paged on-call CT Surgeon twice in reference to this patient's home Lantus that pharmacy is tracking as a home med but was not addressed today or with the medrec from this admission.  The patient is ACHS and PO Cardiac diet.  The patient states that she takes 80 units of Lantus at night before bed.  She did indicate that her BGL would probably be in the 600s tomorrow without Lantus tonight.  I will make a third attempt to contact CTS shortly.  Results for SYMPHANIE, CEDERBERG (MRN [id number]) as of 03/01/2017 20:03  Ref. Range 03/01/2017 18:24  Glucose-Capillary Latest Ref Range: 65 - 99 mg/dL 297 (H)  Results for JERLEAN, PERALTA (MRN [id number]) as of 03/01/2017 20:03  Ref. Range 03/01/2017 19:52  Glucose-Capillary Latest Ref Range: 65 - 99 mg/dL 367 (H)

## 2017-03-01 NOTE — Progress Notes (Signed)
Patient ID: [name], female   DOB: 09/18/1945, 71 y.o.   MRN: [id number] EVENING ROUNDS NOTE :     [address]       [place] [address]             [phone]                 1 Day Post-Op Procedure(s) (LRB): PERICARDIAL WINDOW (N/A)  Total Length of Stay:  LOS: 1 day  BP (!) 101/54   Pulse 86   Temp 98.7 F (37.1 C) (Oral)   Resp 19   Ht 5\' 4"  (1.626 m)   Wt 251 lb 15.8 oz (114.3 kg)   SpO2 96%   BMI 43.25 kg/m   .Intake/Output      11/07 0701 - 11/08 0700 11/08 0701 - 11/09 0700   P.O.  600   I.V. (mL/kg) 1104.2 (9.7) 50 (0.4)   IV Piggyback 50 200   Total Intake(mL/kg) 1154.2 (10.1) 850 (7.4)   Urine (mL/kg/hr) 1905 0 (0)   Other 1500    Blood 100    Chest Tube 142 30   Total Output 3647 30   Net -2492.8 +820        Urine Occurrence  1400 x     . potassium chloride Stopped (03/01/17 1143)     Lab Results  Component Value Date   WBC 10.9 (H) 03/01/2017   HGB 10.7 (L) 03/01/2017   HCT 33.6 (L) 03/01/2017   PLT 209 03/01/2017   GLUCOSE 81 03/01/2017   CHOL  10/01/2009    153        ATP III CLASSIFICATION:  <200     mg/dL   Desirable  200-239  mg/dL   Borderline High  >=240    mg/dL   High          TRIG 272 (H) 10/01/2009   HDL 37 (L) 10/01/2009   LDLCALC  10/01/2009    62        Total Cholesterol/HDL:CHD Risk Coronary Heart Disease Risk Table                     Men   Women  1/2 Average Risk   3.4   3.3  Average Risk       5.0   4.4  2 X Average Risk   9.6   7.1  3 X Average Risk  23.4   11.0        Use the calculated Patient Ratio above and the CHD Risk Table to determine the patient's CHD Risk.        ATP III CLASSIFICATION (LDL):  <100     mg/dL   Optimal  100-129  mg/dL   Near or Above                    Optimal  130-159  mg/dL   Borderline  160-189  mg/dL   High  >190     mg/dL   Very High   ALT 12 (L) 03/01/2017   AST 34 03/01/2017   NA 140 03/01/2017   K 3.5 03/01/2017   CL 109 03/01/2017   CREATININE 0.89 03/01/2017   BUN 10 03/01/2017   CO2 27 03/01/2017   TSH 0.537 Test methodology is 3rd generation TSH 09/30/2009   INR 0.92 07/11/2013   HGBA1C (H) 08/25/2010    7.5 (NOTE)  According to the ADA Clinical Practice Recommendations for 2011, when HbA1c is used as a screening test:   >=6.5%   Diagnostic of Diabetes Mellitus           (if abnormal result  is confirmed)  5.7-6.4%   Increased risk of developing Diabetes Mellitus  References:Diagnosis and Classification of Diabetes Mellitus,Diabetes GFQM,2103,12(OFVWA 1):S62-S69 and Standards of Medical Care in         Diabetes - 2011,Diabetes QLRJ,7366,81  (Suppl 1):S11-S61.   Stable after drainage pericardial fluid   Grace Isaac MD  Beeper [phone] Office [phone] 03/01/2017 6:23 PM

## 2017-03-01 NOTE — Progress Notes (Signed)
Inpatient Diabetes Program Recommendations  AACE/ADA: New Consensus Statement on Inpatient Glycemic Control (2015)  Target Ranges:  Prepandial:   less than 140 mg/dL      Peak postprandial:   less than 180 mg/dL (1-2 hours)      Critically ill patients:  140 - 180 mg/dL     Review of Glycemic Control:  Results for ZAREEN, JAMISON (MRN [id number]) as of 03/01/2017 10:01  Ref. Range 02/28/2017 18:23 02/28/2017 20:43 02/28/2017 23:25 03/01/2017 05:00 03/01/2017 08:06  Glucose-Capillary Latest Ref Range: 65 - 99 mg/dL 134 (H) 88 69 79 116 (H)   Diabetes history: Type 2 diabetes Outpatient Diabetes medications: Lantus 80 units q HS Current orders for Inpatient glycemic control:  Novolog moderate tid with meals  Inpatient Diabetes Program Recommendations:   Blood sugars well controlled at this time.  If blood sugars do increase, may consider restarting 1/2 of patient's home dose of basal insulin (Lantus 40 units daily).   Thanks, Adah Perl, RN, BC-ADM Inpatient Diabetes Coordinator Pager [phone] (8a-5p)

## 2017-03-01 NOTE — Discharge Summary (Signed)
Physician Discharge Summary  Patient ID: [name] MRN: [id number] DOB/AGE: 1945/04/29 71 y.o.  Admit date: 02/28/2017 Discharge date: 03/05/2017  Admission Diagnoses: Patient Active Problem List   Diagnosis Date Noted  . Tamponade 02/28/2017  . Pericardial effusion 02/28/2017    Discharge Diagnoses:  Active Problems:   Tamponade   Pericardial effusion   Discharged Condition: good  HPI:   The patient is a 71 year old woman with stage IV RUL non-small cell lung cancer treated at [facility] on current immunotherapy, diabetes, hyperlipidemia who was seen within the past couple of weeks at [facility] and told that she had some pericardial effusion but no treatment needed. She now presents with a several day history of progressive shortness of breath, cough, upper abdominal pain and abdominal distension. She had a CT of the chest today which showed no PE but did show a large pericardial effusion and small bilateral pleural effusions. There is a 14 mm subcarinal lymph node that was previously 3 cm and a 2.2 cm right paratracheal node that was previously 1.7 cm. She had an echo in the ER showing a large circumferential pericardial effusion with some fibrin stranding and some RV collapse and features consistent with tamponade. LVEF 65-70%. Cardiac valves look ok.    Hospital Course:  On 02/28/2017 Ms. Perin underwent a subxiphoid pericardial window for stage IV lung cancer with a large pericardial effusion with Dr. Cyndia Bent.  She tolerated the procedure well and was transferred to the ICU.  She is extubated in a timely manner. Postoperatively she did well.  She was ambulating in the ICU.  She remained hemodynamically stable in NSR.  Her pericardial drain output decreased over several days.  She was transferred to the telemetry unit on 03/03/2017 for further care.  Her pericardial drain was removed on 03/04/2017.  Follow up CXR showed a small right effusion.  She continued to remain clinically stable.   She was ambulating and tolerating a regular diet.  She was felt medically stable for discharge home today.  Pathology and cytology remain pending.  Consults: cardiology  Significant Diagnostic Studies:   CLINICAL DATA:  Postoperative for pericardial window procedure, central line check.  EXAM: PORTABLE CHEST 1 VIEW  COMPARISON:  02/28/2017 chest CT  FINDINGS: A drain projects over the cardiac shadow.  Right IJ central line tip suggest into the SVC.  Mild enlargement of the cardiopericardial silhouette. Indistinct pulmonary vasculature. Hazy opacity at the right lung base possibly from layering effusion. Left retrocardiac airspace opacity is nonspecific and could be from atelectasis or effusion.  IMPRESSION: 1. Right IJ line tip is just into the SVC. 2. Drain projects over the cardiac shadow. 3. Increased left retrocardiac airspace opacity, probably from atelectasis, less likely enlarging pleural effusion. 4. Pulmonary venous hypertension with mild enlargement of the cardiopericardial silhouette.   Electronically Signed   By: Van Clines M.D.   On: 02/28/2017 18:45   Treatments:  CARDIOVASCULAR SURGERY OPERATIVE NOTE  02/28/2017  Surgeon:  [name], MD  First Assistant: Sharee Pimple, RNFA   Preoperative Diagnosis:  Stage IV lung cancer with large pericardial effusion  Postoperative Diagnosis:  Same  Procedure:  Subxyphoid pericardial window   Anesthesia:  General Endotracheal    Discharge Exam: Blood pressure 130/64, pulse 88, temperature 98.3 F (36.8 C), temperature source Oral, resp. rate 18, height 5\' 4"  (1.626 m), weight 255 lb 4.8 oz (115.8 kg), SpO2 95 %.    General appearance: alert, cooperative and no distress Heart: regular rate  and rhythm and no rub Lungs: some exp wheeze and crackles Abdomen: benign Extremities: some right hand swelling, BLE mild edema Wound: incis healing well    Disposition:  01-Home or Self Care  Discharge Instructions    Discharge patient   Complete by:  As directed    Discharge disposition:  01-Home or Self Care   Discharge patient date:  03/05/2017     Allergies as of 03/05/2017      Reactions   Morphine And Related Nausea And Vomiting, Other (See Comments)   Headache (also)   Tape Rash   Can only tolerate for short amounts of time (CANNOT BE "LEFT" ON THE SAME SITE FOR LONG)      Medication List    STOP taking these medications   DEXILANT 60 MG capsule Generic drug:  dexlansoprazole     TAKE these medications   ALPRAZolam 1 MG tablet Commonly known as:  XANAX Take 1 tablet (1 mg total) 2 (two) times daily as needed by mouth for anxiety. What changed:    when to take this  reasons to take this   carbidopa-levodopa 25-250 MG tablet Commonly known as:  SINEMET IR Take 1 tablet by mouth at bedtime.   fentaNYL 100 MCG/HR Commonly known as:  DURAGESIC - dosed mcg/hr Place 100 mcg every 3 (three) days onto the skin.   Fluticasone-Salmeterol 500-50 MCG/DOSE Aepb Commonly known as:  ADVAIR Inhale 1 puff into the lungs every 12 (twelve) hours.   furosemide 40 MG tablet Commonly known as:  LASIX Take 1 tablet (40 mg total) daily by mouth. Start taking on:  03/06/2017 What changed:    medication strength  how much to take  when to take this  reasons to take this   gabapentin 300 MG capsule Commonly known as:  NEURONTIN Take 900 mg at bedtime by mouth.   hydrocortisone 2.5 % cream Apply 1 application as needed topically.   insulin glargine 100 UNIT/ML injection Commonly known as:  LANTUS Inject 80 Units at bedtime into the skin.   omeprazole 40 MG capsule Commonly known as:  PRILOSEC Take 1 capsule (40 mg total) by mouth 2 (two) times daily.   Oxycodone HCl 20 MG Tabs Take 20-40 mg every 4 (four) hours as needed by mouth for pain.   potassium chloride SA 20 MEQ tablet Commonly known as:  K-DUR,KLOR-CON Take 1 tablet  (20 mEq total) daily by mouth. Start taking on:  03/06/2017   PROAIR HFA 108 (90 Base) MCG/ACT inhaler Generic drug:  albuterol Inhale 2 puffs into the lungs every 4 (four) hours as needed for wheezing or shortness of breath.   promethazine 12.5 MG tablet Commonly known as:  PHENERGAN Take 12.5 mg as needed by mouth.   sertraline 100 MG tablet Commonly known as:  ZOLOFT Take 100 mg by mouth daily.   tiZANidine 4 MG tablet Commonly known as:  ZANAFLEX Take 4 mg by mouth every 6 (six) hours as needed for muscle spasms.      Follow-up Information    Octavio Graves, DO. Call in 1 day(s).   Contact information: [address] [place] [address] [phone]        [name], MD Follow up.   Specialty:  Cardiothoracic Surgery Why:  Your appointment is on 03/21/2017 at 4:30 PM.  Please arrive at 4 PM for a chest x-ray located at [facility] imaging which is on the first floor building. Contact information: [address]  [place] [address] [phone]        suture removal appointment Follow up.   Why:  Your suture removal appointment is on 03/09/2017. Contact information: Dr. Vivi Martens office       Servando Snare Marlane Hatcher, NP Follow up.   Specialties:  Nurse Practitioner, Interventional Cardiology, Cardiology, Radiology Why:    Truitt Merle, NP 11/26 @11am  ([facility])    Contact information: [place]. [address]  [address] [phone]           Signed: John Giovanni 03/05/2017, 4:15 PM

## 2017-03-01 NOTE — Progress Notes (Signed)
1 Day Post-Op Procedure(s) (LRB): PERICARDIAL WINDOW (N/A) Subjective: Feels better. Some incisional pain. Breathing much improved.  Objective: Vital signs in last 24 hours: Temp:  [97.3 F (36.3 C)-98.4 F (36.9 C)] 98.4 F (36.9 C) (11/07 2330) Pulse Rate:  [91-123] 98 (11/08 0600) Cardiac Rhythm: Normal sinus rhythm (11/08 0400) Resp:  [12-24] 19 (11/08 0600) BP: (92-140)/(47-92) 106/57 (11/08 0600) SpO2:  [89 %-97 %] 90 % (11/08 0600) Arterial Line BP: (99-178)/(46-114) 117/56 (11/08 0600) Weight:  [114.3 kg (251 lb 15.8 oz)] 114.3 kg (251 lb 15.8 oz) (11/08 0600)  Hemodynamic parameters for last 24 hours:    Intake/Output from previous day: 11/07 0701 - 11/08 0700 In: 1154.2 [I.V.:1104.2; IV Piggyback:50] Out: 2426 [Urine:1905; Blood:100; Chest Tube:142] Intake/Output this shift: No intake/output data recorded.  General appearance: alert and cooperative Neurologic: intact Heart: regular rate and rhythm, S1, S2 normal, no murmur, click, rub or gallop Lungs: clear to auscultation bilaterally Extremities: extremities normal, atraumatic, no cyanosis or edema Wound: dressing dry  Lab Results: Recent Labs    02/28/17 1005 03/01/17 0458  WBC 11.0* 10.9*  HGB 12.7 10.7*  HCT 40.1 33.6*  PLT 302 209   BMET:  Recent Labs    02/28/17 1005 03/01/17 0458  NA 136 140  K 3.9 3.5  CL 105 109  CO2 21* 27  GLUCOSE 221* 81  BUN 16 10  CREATININE 1.11* 0.89  CALCIUM 8.7* 7.9*    PT/INR: No results for input(s): LABPROT, INR in the last 72 hours. ABG    Component Value Date/Time   TCO2 20 04/01/2013 1912   CBG (last 3)  Recent Labs    02/28/17 2043 02/28/17 2325 03/01/17 0500  GLUCAP 88 69 79    Assessment/Plan: S/P Procedure(s) (LRB): PERICARDIAL WINDOW (N/A)  She is hemodynamically stable in sinus rhythm. Continue pericardial tube to suction when not ambulating. Ambulate IS DC IVF   LOS: 1 day    [name] 03/01/2017

## 2017-03-01 NOTE — Progress Notes (Signed)
Progress Note  Patient Name: [name] Date of Encounter: 03/01/2017  Primary Cardiologist: New  Subjective   Better, breathing improved, no cp. Tired however  Inpatient Medications    Scheduled Meds: . acetaminophen  1,000 mg Oral Q6H   Or  . acetaminophen (TYLENOL) oral liquid 160 mg/5 mL  1,000 mg Oral Q6H  . bisacodyl  10 mg Oral Daily  . carbidopa-levodopa  1 tablet Oral QHS  . enoxaparin (LOVENOX) injection  40 mg Subcutaneous Q24H  . fentaNYL  100 mcg Transdermal Q3 days  . insulin aspart  0-15 Units Subcutaneous TID WC  . mouth rinse  15 mL Mouth Rinse BID  . metoCLOPramide (REGLAN) injection  10 mg Intravenous Q6H  . mometasone-formoterol  2 puff Inhalation BID  . pantoprazole  40 mg Oral Daily  . senna-docusate  1 tablet Oral QHS  . sertraline  100 mg Oral QHS   Continuous Infusions: . cefUROXime (ZINACEF)  IV Stopped (03/01/17 0535)  . potassium chloride 10 mEq (03/01/17 0819)   PRN Meds: albuterol, ALPRAZolam, fentaNYL (SUBLIMAZE) injection, ondansetron (ZOFRAN) IV, oxyCODONE, potassium chloride   Vital Signs    Vitals:   03/01/17 0500 03/01/17 0600 03/01/17 0700 03/01/17 0800  BP: 99/60 (!) 106/57 (!) 92/48 (!) 84/50  Pulse: 95 98 95 93  Resp: 16 19 (!) 24 18  Temp:    98.6 F (37 C)  TempSrc:    Axillary  SpO2: 93% 90% 91% 94%  Weight:  251 lb 15.8 oz (114.3 kg)      Intake/Output Summary (Last 24 hours) at 03/01/2017 0914 Last data filed at 03/01/2017 0800 Gross per 24 hour  Intake 1254.17 ml  Output 3647 ml  Net -2392.83 ml   Filed Weights   03/01/17 0600  Weight: 251 lb 15.8 oz (114.3 kg)    Telemetry    NSR, RBBB - Personally Reviewed  ECG    ST ach, BBB - Personally Reviewed  Physical Exam   GEN: No acute distress.   Neck: No JVD, R IJ Cardiac: RRR, no murmurs, rubs, or gallops. Drain Respiratory: Clear to auscultation bilaterally. GI: Soft, nontender, non-distended  MS: No edema; No deformity. Neuro:  Nonfocal   Psych: Normal affect   Labs    Chemistry Recent Labs  Lab 02/28/17 1005 03/01/17 0458  NA 136 140  K 3.9 3.5  CL 105 109  CO2 21* 27  GLUCOSE 221* 81  BUN 16 10  CREATININE 1.11* 0.89  CALCIUM 8.7* 7.9*  PROT 6.9 5.3*  ALBUMIN 3.1* 2.4*  AST 54* 34  ALT 37 12*  ALKPHOS 96 78  BILITOT 0.6 0.6  GFRNONAA 49* >60  GFRAA 57* >60  ANIONGAP 10 4*     Hematology Recent Labs  Lab 02/28/17 1005 03/01/17 0458  WBC 11.0* 10.9*  RBC 4.80 3.98  HGB 12.7 10.7*  HCT 40.1 33.6*  MCV 83.5 84.4  MCH 26.5 26.9  MCHC 31.7 31.8  RDW 14.5 14.8  PLT 302 209    Cardiac EnzymesNo results for input(s): TROPONINI in the last 168 hours.  Recent Labs  Lab 02/28/17 1017  TROPIPOC 0.01     BNP Recent Labs  Lab 02/28/17 1005  BNP 104.3*     DDimer No results for input(s): DDIMER in the last 168 hours.   Radiology    Dg Chest 2 View  Result Date: 02/28/2017 CLINICAL DATA:  WORSENING CHEST PAIN,SOB.PAST FEW DAYS.HX LUNG CA EXAM: CHEST  2 VIEW COMPARISON:  Chest  x-ray dated 05/06/2016. FINDINGS: Marked cardiomegaly, heart size significantly increased compared to the previous study. Chronic mild interstitial thickening throughout both lungs. No pleural effusion or pneumothorax seen. Osseous structures about the chest are unremarkable. Mild degenerative spurring again noted within the thoracic spine. IMPRESSION: 1. Marked cardiomegaly. Heart size has significantly increased compared to the previous study of 05/06/2016. Findings highly suspicious for pericardial effusion. Recommend correlation with ECHO and/or chest CT angiogram. 2. Stable chronic interstitial prominence within each lung. No new lung findings seen. No evidence of pneumonia or overt alveolar pulmonary edema. These results were called by telephone at the time of interpretation on 02/28/2017 at 11:18 am to Dr. Gareth Morgan , who verbally acknowledged these results. Electronically Signed   By: Franki Cabot M.D.   On:  02/28/2017 11:20   Ct Angio Chest Pe W And/or Wo Contrast  Result Date: 02/28/2017 CLINICAL DATA:  Chest pain, fatigue, stage IV lung cancer. Abdominal distention/pain. EXAM: CT ANGIOGRAPHY CHEST CT ABDOMEN AND PELVIS WITH CONTRAST TECHNIQUE: Multidetector CT imaging of the chest was performed using the standard protocol during bolus administration of intravenous contrast. Multiplanar CT image reconstructions and MIPs were obtained to evaluate the vascular anatomy. Multidetector CT imaging of the abdomen and pelvis was performed using the standard protocol during bolus administration of intravenous contrast. CONTRAST:  100 mL Isovue 370 IV COMPARISON:  CTA chest dated 05/06/2016. CT abdomen/ pelvis dated 04/14/2016. FINDINGS: CTA CHEST FINDINGS Cardiovascular: Satisfactory opacification of the bilateral pulmonary arteries to the lobar level. No evidence of pulmonary embolism. Large pericardial effusion, new. No evidence of thoracic aortic aneurysm. Atherosclerotic calcifications of the aortic arch. Mediastinum/Nodes: 14 mm subcarinal node, previously 3.0 cm. Suspected 2.2 cm right paratracheal node (series 5/ image 37), poorly visualized, previously 1.7 cm. Visualized thyroid is unremarkable. Lungs/Pleura: Evaluation of the lung parenchyma is constrained by respiratory motion. Within that constraint, there are no suspicious pulmonary nodules. Mild paraseptal emphysematous changes, upper lobe predominant. Small bilateral pleural effusions, right greater than left. No pneumothorax. Musculoskeletal: Degenerative changes of the visualized thoracolumbar spine. Review of the MIP images confirms the above findings. CT ABDOMEN and PELVIS FINDINGS Hepatobiliary: Liver is within normal limits. Status post cholecystectomy. No intrahepatic or extrahepatic ductal dilatation. Pancreas: Within normal limits. Spleen: Normal in size. Adrenals/Urinary Tract: Adrenal glands are within normal limits. Kidneys are within normal  limits.  No hydronephrosis. Bladder is underdistended but unremarkable. Stomach/Bowel: Stomach is within normal limits. No evidence of bowel obstruction. Appendix is not discretely visualized. Postsurgical changes related to left colonic resection with suture line in the left lower abdomen (series 12/ image 66). Vascular/Lymphatic: No evidence of abdominal aortic aneurysm. Atherosclerotic calcifications of the abdominal aorta and branch vessels. No suspicious abdominopelvic lymphadenopathy. Reproductive: Status post hysterectomy. No adnexal masses. Other: Small volume perihepatic and pelvic ascites. Postsurgical changes along the anterior abdominal wall with tiny midline fat containing ventral hernias. Musculoskeletal: Degenerative changes of the visualized thoracolumbar spine. Dominant superior endplate Schmorl's node involving L4. Review of the MIP images confirms the above findings. IMPRESSION: No evidence of pulmonary embolism. Large pericardial effusion. Consider echocardiogram to exclude cardiac tamponade, as clinically warranted. Mediastinal lymphadenopathy, as described above, with overall mixed response. Nodal metastases remain possible. Small bilateral pleural effusions, right greater than left. Small volume abdominopelvic ascites. No evidence of metastatic disease in the abdomen/pelvis. Electronically Signed   By: Julian Hy M.D.   On: 02/28/2017 14:09   Ct Abdomen Pelvis W Contrast  Result Date: 02/28/2017 CLINICAL DATA:  Chest pain, fatigue,  stage IV lung cancer. Abdominal distention/pain. EXAM: CT ANGIOGRAPHY CHEST CT ABDOMEN AND PELVIS WITH CONTRAST TECHNIQUE: Multidetector CT imaging of the chest was performed using the standard protocol during bolus administration of intravenous contrast. Multiplanar CT image reconstructions and MIPs were obtained to evaluate the vascular anatomy. Multidetector CT imaging of the abdomen and pelvis was performed using the standard protocol during bolus  administration of intravenous contrast. CONTRAST:  100 mL Isovue 370 IV COMPARISON:  CTA chest dated 05/06/2016. CT abdomen/ pelvis dated 04/14/2016. FINDINGS: CTA CHEST FINDINGS Cardiovascular: Satisfactory opacification of the bilateral pulmonary arteries to the lobar level. No evidence of pulmonary embolism. Large pericardial effusion, new. No evidence of thoracic aortic aneurysm. Atherosclerotic calcifications of the aortic arch. Mediastinum/Nodes: 14 mm subcarinal node, previously 3.0 cm. Suspected 2.2 cm right paratracheal node (series 5/ image 37), poorly visualized, previously 1.7 cm. Visualized thyroid is unremarkable. Lungs/Pleura: Evaluation of the lung parenchyma is constrained by respiratory motion. Within that constraint, there are no suspicious pulmonary nodules. Mild paraseptal emphysematous changes, upper lobe predominant. Small bilateral pleural effusions, right greater than left. No pneumothorax. Musculoskeletal: Degenerative changes of the visualized thoracolumbar spine. Review of the MIP images confirms the above findings. CT ABDOMEN and PELVIS FINDINGS Hepatobiliary: Liver is within normal limits. Status post cholecystectomy. No intrahepatic or extrahepatic ductal dilatation. Pancreas: Within normal limits. Spleen: Normal in size. Adrenals/Urinary Tract: Adrenal glands are within normal limits. Kidneys are within normal limits.  No hydronephrosis. Bladder is underdistended but unremarkable. Stomach/Bowel: Stomach is within normal limits. No evidence of bowel obstruction. Appendix is not discretely visualized. Postsurgical changes related to left colonic resection with suture line in the left lower abdomen (series 12/ image 66). Vascular/Lymphatic: No evidence of abdominal aortic aneurysm. Atherosclerotic calcifications of the abdominal aorta and branch vessels. No suspicious abdominopelvic lymphadenopathy. Reproductive: Status post hysterectomy. No adnexal masses. Other: Small volume  perihepatic and pelvic ascites. Postsurgical changes along the anterior abdominal wall with tiny midline fat containing ventral hernias. Musculoskeletal: Degenerative changes of the visualized thoracolumbar spine. Dominant superior endplate Schmorl's node involving L4. Review of the MIP images confirms the above findings. IMPRESSION: No evidence of pulmonary embolism. Large pericardial effusion. Consider echocardiogram to exclude cardiac tamponade, as clinically warranted. Mediastinal lymphadenopathy, as described above, with overall mixed response. Nodal metastases remain possible. Small bilateral pleural effusions, right greater than left. Small volume abdominopelvic ascites. No evidence of metastatic disease in the abdomen/pelvis. Electronically Signed   By: Julian Hy M.D.   On: 02/28/2017 14:09   Dg Chest Port 1 View  Result Date: 02/28/2017 CLINICAL DATA:  Postoperative for pericardial window procedure, central line check. EXAM: PORTABLE CHEST 1 VIEW COMPARISON:  02/28/2017 chest CT FINDINGS: A drain projects over the cardiac shadow. Right IJ central line tip suggest into the SVC. Mild enlargement of the cardiopericardial silhouette. Indistinct pulmonary vasculature. Hazy opacity at the right lung base possibly from layering effusion. Left retrocardiac airspace opacity is nonspecific and could be from atelectasis or effusion. IMPRESSION: 1. Right IJ line tip is just into the SVC. 2. Drain projects over the cardiac shadow. 3. Increased left retrocardiac airspace opacity, probably from atelectasis, less likely enlarging pleural effusion. 4. Pulmonary venous hypertension with mild enlargement of the cardiopericardial silhouette. Electronically Signed   By: Van Clines M.D.   On: 02/28/2017 18:45    Cardiac Studies   ECHO - tamponade  Patient Profile     71 y.o. female Tamponade in setting of Met cancer  Assessment & Plan    Tamponade  -  Thankful to Dr. Cyndia Bent and TCTS team, window  - 1.5L bloody removed.   - BP improved immediately, currently soft.   Candee Furbish, MD   For questions or updates, please contact [facility] Please consult [email or web address] for contact info under Cardiology/STEMI.      Signed, Candee Furbish, MD  03/01/2017, 9:14 AM

## 2017-03-02 LAB — BASIC METABOLIC PANEL
Anion gap: 4 — ABNORMAL LOW (ref 5–15)
BUN: 8 mg/dL (ref 6–20)
CO2: 28 mmol/L (ref 22–32)
CREATININE: 0.82 mg/dL (ref 0.44–1.00)
Calcium: 8 mg/dL — ABNORMAL LOW (ref 8.9–10.3)
Chloride: 105 mmol/L (ref 101–111)
GFR calc Af Amer: >60 mL/min (ref >60)
GLUCOSE: 240 mg/dL — AB (ref 65–99)
POTASSIUM: 4 mmol/L (ref 3.5–5.1)
SODIUM: 137 mmol/L (ref 135–145)

## 2017-03-02 LAB — CBC
HEMATOCRIT: 31.5 % — AB (ref 36.0–46.0)
HEMOGLOBIN: 9.9 g/dL — AB (ref 12.0–15.0)
MCH: 26.4 pg (ref 26.0–34.0)
MCHC: 31.4 g/dL (ref 30.0–36.0)
MCV: 84 fL (ref 78.0–100.0)
Platelets: 181 10*3/uL (ref 150–400)
RBC: 3.75 MIL/uL — ABNORMAL LOW (ref 3.87–5.11)
RDW: 14.6 % (ref 11.5–15.5)
WBC: 7.8 10*3/uL (ref 4.0–10.5)

## 2017-03-02 LAB — GLUCOSE, CAPILLARY
GLUCOSE-CAPILLARY: 173 mg/dL — AB (ref 65–99)
GLUCOSE-CAPILLARY: 258 mg/dL — AB (ref 65–99)
Glucose-Capillary: 176 mg/dL — ABNORMAL HIGH (ref 65–99)
Glucose-Capillary: 174 mg/dL — ABNORMAL HIGH (ref 65–99)

## 2017-03-02 MED ORDER — CHLORHEXIDINE GLUCONATE CLOTH 2 % EX PADS
6.0000 | MEDICATED_PAD | Freq: Every day | CUTANEOUS | Status: DC
  Administered 2017-03-02 – 2017-03-05 (×4): 6 via TOPICAL

## 2017-03-02 MED ORDER — SODIUM CHLORIDE 0.9% FLUSH
10.0000 mL | INTRAVENOUS | Status: DC | PRN
  Administered 2017-03-03: 10 mL
  Administered 2017-03-05: 20 mL
  Filled 2017-03-02 (×2): qty 40

## 2017-03-02 NOTE — Progress Notes (Signed)
Progress Note  Patient Name: [name] Date of Encounter: 03/02/2017  Primary Cardiologist: New  Subjective   CPAP mask in place.  Alert.  Feeling better.  Less short of breath.  No chest pain.  Inpatient Medications    Scheduled Meds: . acetaminophen  1,000 mg Oral Q6H   Or  . acetaminophen (TYLENOL) oral liquid 160 mg/5 mL  1,000 mg Oral Q6H  . bisacodyl  10 mg Oral Daily  . carbidopa-levodopa  1 tablet Oral QHS  . Chlorhexidine Gluconate Cloth  6 each Topical Daily  . enoxaparin (LOVENOX) injection  40 mg Subcutaneous Q24H  . fentaNYL  100 mcg Transdermal Q3 days  . insulin aspart  0-15 Units Subcutaneous TID WC  . insulin glargine  80 Units Subcutaneous QHS  . mouth rinse  15 mL Mouth Rinse BID  . mometasone-formoterol  2 puff Inhalation BID  . pantoprazole  40 mg Oral Daily  . senna-docusate  1 tablet Oral QHS  . sertraline  100 mg Oral QHS   Continuous Infusions: . potassium chloride Stopped (03/01/17 1143)   PRN Meds: albuterol, ALPRAZolam, fentaNYL (SUBLIMAZE) injection, ondansetron (ZOFRAN) IV, oxyCODONE, potassium chloride, sodium chloride flush   Vital Signs    Vitals:   03/02/17 0500 03/02/17 0515 03/02/17 0600 03/02/17 0700  BP:  128/71 121/65 121/79  Pulse:  95 88 76  Resp: (!) 25 18 15 10   Temp:    97.9 F (36.6 C)  TempSrc:      SpO2:  (!) 89% 92% 95%  Weight:      Height:        Intake/Output Summary (Last 24 hours) at 03/02/2017 0818 Last data filed at 03/02/2017 0500 Gross per 24 hour  Intake 870 ml  Output 580 ml  Net 290 ml   Filed Weights   03/01/17 0600 03/02/17 0450  Weight: 251 lb 15.8 oz (114.3 kg) 254 lb 13.6 oz (115.6 kg)    Telemetry    NSR, RBBB - Personally Reviewed  ECG    ST ach, BBB - Personally Reviewed  Physical Exam  GEN: Well nourished, well developed, in no acute distress  HEENT: normal  Neck: no JVD, carotid bruits, or masses, right IJ Cardiac: RRR; no murmurs, rubs, or gallops,no edema    Respiratory:  clear to auscultation bilaterally, normal work of breathing GI: soft, nontender, nondistended, + BS MS: no deformity or atrophy  Skin: warm and dry, no rash Neuro:  Alert and Oriented x 3, Strength and sensation are intact Psych: euthymic mood, full affect   Labs    Chemistry Recent Labs  Lab 02/28/17 1005 03/01/17 0458 03/02/17 0409  NA 136 140 137  K 3.9 3.5 4.0  CL 105 109 105  CO2 21* 27 28  GLUCOSE 221* 81 240*  BUN 16 10 8   CREATININE 1.11* 0.89 0.82  CALCIUM 8.7* 7.9* 8.0*  PROT 6.9 5.3*  --   ALBUMIN 3.1* 2.4*  --   AST 54* 34  --   ALT 37 12*  --   ALKPHOS 96 78  --   BILITOT 0.6 0.6  --   GFRNONAA 49* >60 >60  GFRAA 57* >60 >60  ANIONGAP 10 4* 4*     Hematology Recent Labs  Lab 02/28/17 1005 03/01/17 0458 03/02/17 0409  WBC 11.0* 10.9* 7.8  RBC 4.80 3.98 3.75*  HGB 12.7 10.7* 9.9*  HCT 40.1 33.6* 31.5*  MCV 83.5 84.4 84.0  MCH 26.5 26.9 26.4  MCHC 31.7  31.8 31.4  RDW 14.5 14.8 14.6  PLT 302 209 181    Cardiac EnzymesNo results for input(s): TROPONINI in the last 168 hours.  Recent Labs  Lab 02/28/17 1017  TROPIPOC 0.01     BNP Recent Labs  Lab 02/28/17 1005  BNP 104.3*     DDimer No results for input(s): DDIMER in the last 168 hours.   Radiology    Dg Chest 2 View  Result Date: 02/28/2017 CLINICAL DATA:  WORSENING CHEST PAIN,SOB.PAST FEW DAYS.HX LUNG CA EXAM: CHEST  2 VIEW COMPARISON:  Chest x-ray dated 05/06/2016. FINDINGS: Marked cardiomegaly, heart size significantly increased compared to the previous study. Chronic mild interstitial thickening throughout both lungs. No pleural effusion or pneumothorax seen. Osseous structures about the chest are unremarkable. Mild degenerative spurring again noted within the thoracic spine. IMPRESSION: 1. Marked cardiomegaly. Heart size has significantly increased compared to the previous study of 05/06/2016. Findings highly suspicious for pericardial effusion. Recommend correlation  with ECHO and/or chest CT angiogram. 2. Stable chronic interstitial prominence within each lung. No new lung findings seen. No evidence of pneumonia or overt alveolar pulmonary edema. These results were called by telephone at the time of interpretation on 02/28/2017 at 11:18 am to Dr. Gareth Morgan , who verbally acknowledged these results. Electronically Signed   By: Franki Cabot M.D.   On: 02/28/2017 11:20   Ct Angio Chest Pe W And/or Wo Contrast  Result Date: 02/28/2017 CLINICAL DATA:  Chest pain, fatigue, stage IV lung cancer. Abdominal distention/pain. EXAM: CT ANGIOGRAPHY CHEST CT ABDOMEN AND PELVIS WITH CONTRAST TECHNIQUE: Multidetector CT imaging of the chest was performed using the standard protocol during bolus administration of intravenous contrast. Multiplanar CT image reconstructions and MIPs were obtained to evaluate the vascular anatomy. Multidetector CT imaging of the abdomen and pelvis was performed using the standard protocol during bolus administration of intravenous contrast. CONTRAST:  100 mL Isovue 370 IV COMPARISON:  CTA chest dated 05/06/2016. CT abdomen/ pelvis dated 04/14/2016. FINDINGS: CTA CHEST FINDINGS Cardiovascular: Satisfactory opacification of the bilateral pulmonary arteries to the lobar level. No evidence of pulmonary embolism. Large pericardial effusion, new. No evidence of thoracic aortic aneurysm. Atherosclerotic calcifications of the aortic arch. Mediastinum/Nodes: 14 mm subcarinal node, previously 3.0 cm. Suspected 2.2 cm right paratracheal node (series 5/ image 37), poorly visualized, previously 1.7 cm. Visualized thyroid is unremarkable. Lungs/Pleura: Evaluation of the lung parenchyma is constrained by respiratory motion. Within that constraint, there are no suspicious pulmonary nodules. Mild paraseptal emphysematous changes, upper lobe predominant. Small bilateral pleural effusions, right greater than left. No pneumothorax. Musculoskeletal: Degenerative changes of  the visualized thoracolumbar spine. Review of the MIP images confirms the above findings. CT ABDOMEN and PELVIS FINDINGS Hepatobiliary: Liver is within normal limits. Status post cholecystectomy. No intrahepatic or extrahepatic ductal dilatation. Pancreas: Within normal limits. Spleen: Normal in size. Adrenals/Urinary Tract: Adrenal glands are within normal limits. Kidneys are within normal limits.  No hydronephrosis. Bladder is underdistended but unremarkable. Stomach/Bowel: Stomach is within normal limits. No evidence of bowel obstruction. Appendix is not discretely visualized. Postsurgical changes related to left colonic resection with suture line in the left lower abdomen (series 12/ image 66). Vascular/Lymphatic: No evidence of abdominal aortic aneurysm. Atherosclerotic calcifications of the abdominal aorta and branch vessels. No suspicious abdominopelvic lymphadenopathy. Reproductive: Status post hysterectomy. No adnexal masses. Other: Small volume perihepatic and pelvic ascites. Postsurgical changes along the anterior abdominal wall with tiny midline fat containing ventral hernias. Musculoskeletal: Degenerative changes of the visualized thoracolumbar spine. Dominant  superior endplate Schmorl's node involving L4. Review of the MIP images confirms the above findings. IMPRESSION: No evidence of pulmonary embolism. Large pericardial effusion. Consider echocardiogram to exclude cardiac tamponade, as clinically warranted. Mediastinal lymphadenopathy, as described above, with overall mixed response. Nodal metastases remain possible. Small bilateral pleural effusions, right greater than left. Small volume abdominopelvic ascites. No evidence of metastatic disease in the abdomen/pelvis. Electronically Signed   By: Julian Hy M.D.   On: 02/28/2017 14:09   Ct Abdomen Pelvis W Contrast  Result Date: 02/28/2017 CLINICAL DATA:  Chest pain, fatigue, stage IV lung cancer. Abdominal distention/pain. EXAM: CT  ANGIOGRAPHY CHEST CT ABDOMEN AND PELVIS WITH CONTRAST TECHNIQUE: Multidetector CT imaging of the chest was performed using the standard protocol during bolus administration of intravenous contrast. Multiplanar CT image reconstructions and MIPs were obtained to evaluate the vascular anatomy. Multidetector CT imaging of the abdomen and pelvis was performed using the standard protocol during bolus administration of intravenous contrast. CONTRAST:  100 mL Isovue 370 IV COMPARISON:  CTA chest dated 05/06/2016. CT abdomen/ pelvis dated 04/14/2016. FINDINGS: CTA CHEST FINDINGS Cardiovascular: Satisfactory opacification of the bilateral pulmonary arteries to the lobar level. No evidence of pulmonary embolism. Large pericardial effusion, new. No evidence of thoracic aortic aneurysm. Atherosclerotic calcifications of the aortic arch. Mediastinum/Nodes: 14 mm subcarinal node, previously 3.0 cm. Suspected 2.2 cm right paratracheal node (series 5/ image 37), poorly visualized, previously 1.7 cm. Visualized thyroid is unremarkable. Lungs/Pleura: Evaluation of the lung parenchyma is constrained by respiratory motion. Within that constraint, there are no suspicious pulmonary nodules. Mild paraseptal emphysematous changes, upper lobe predominant. Small bilateral pleural effusions, right greater than left. No pneumothorax. Musculoskeletal: Degenerative changes of the visualized thoracolumbar spine. Review of the MIP images confirms the above findings. CT ABDOMEN and PELVIS FINDINGS Hepatobiliary: Liver is within normal limits. Status post cholecystectomy. No intrahepatic or extrahepatic ductal dilatation. Pancreas: Within normal limits. Spleen: Normal in size. Adrenals/Urinary Tract: Adrenal glands are within normal limits. Kidneys are within normal limits.  No hydronephrosis. Bladder is underdistended but unremarkable. Stomach/Bowel: Stomach is within normal limits. No evidence of bowel obstruction. Appendix is not discretely  visualized. Postsurgical changes related to left colonic resection with suture line in the left lower abdomen (series 12/ image 66). Vascular/Lymphatic: No evidence of abdominal aortic aneurysm. Atherosclerotic calcifications of the abdominal aorta and branch vessels. No suspicious abdominopelvic lymphadenopathy. Reproductive: Status post hysterectomy. No adnexal masses. Other: Small volume perihepatic and pelvic ascites. Postsurgical changes along the anterior abdominal wall with tiny midline fat containing ventral hernias. Musculoskeletal: Degenerative changes of the visualized thoracolumbar spine. Dominant superior endplate Schmorl's node involving L4. Review of the MIP images confirms the above findings. IMPRESSION: No evidence of pulmonary embolism. Large pericardial effusion. Consider echocardiogram to exclude cardiac tamponade, as clinically warranted. Mediastinal lymphadenopathy, as described above, with overall mixed response. Nodal metastases remain possible. Small bilateral pleural effusions, right greater than left. Small volume abdominopelvic ascites. No evidence of metastatic disease in the abdomen/pelvis. Electronically Signed   By: Julian Hy M.D.   On: 02/28/2017 14:09   Dg Chest Port 1 View  Result Date: 02/28/2017 CLINICAL DATA:  Postoperative for pericardial window procedure, central line check. EXAM: PORTABLE CHEST 1 VIEW COMPARISON:  02/28/2017 chest CT FINDINGS: A drain projects over the cardiac shadow. Right IJ central line tip suggest into the SVC. Mild enlargement of the cardiopericardial silhouette. Indistinct pulmonary vasculature. Hazy opacity at the right lung base possibly from layering effusion. Left retrocardiac airspace opacity is nonspecific and  could be from atelectasis or effusion. IMPRESSION: 1. Right IJ line tip is just into the SVC. 2. Drain projects over the cardiac shadow. 3. Increased left retrocardiac airspace opacity, probably from atelectasis, less likely  enlarging pleural effusion. 4. Pulmonary venous hypertension with mild enlargement of the cardiopericardial silhouette. Electronically Signed   By: Van Clines M.D.   On: 02/28/2017 18:45    Cardiac Studies   ECHO - tamponade  Patient Profile     71 y.o. female Tamponade in setting of metastatic cancer  Assessment & Plan    Tamponade/large pericardial effusion  - Thankful to Dr. Cyndia Bent and TCTS team, window - 1.5L bloody removed.  Pericardial window will give her a durable procedure and hopefully help prevent reaccumulation of fluid especially in the setting of underlying metastatic cancer.  Palliation.  - BP improved immediately.  Obstructive sleep apnea -On CPAP overnight.  Metastatic cancer -Seen at [facility].  Palliative XRT, trial therapy chemotherapy  Please let us know if we can be of further assistance.  Candee Furbish, MD   For questions or updates, please contact [facility] Please consult [email or web address] for contact info under Cardiology/STEMI.      Signed, Candee Furbish, MD  03/02/2017, 8:18 AM

## 2017-03-02 NOTE — Progress Notes (Signed)
2 Days Post-Op Procedure(s) (LRB): PERICARDIAL WINDOW (N/A) Subjective: No complaints  Objective: Vital signs in last 24 hours: Temp:  [97.9 F (36.6 C)-98.3 F (36.8 C)] 97.9 F (36.6 C) (11/09 1100) Pulse Rate:  [76-100] 80 (11/09 1300) Cardiac Rhythm: Normal sinus rhythm (11/09 1200) Resp:  [10-25] 15 (11/09 1300) BP: (81-150)/(38-97) 124/70 (11/09 1300) SpO2:  [89 %-100 %] 98 % (11/09 1300) Weight:  [115.6 kg (254 lb 13.6 oz)] 115.6 kg (254 lb 13.6 oz) (11/09 0450)  Hemodynamic parameters for last 24 hours:    Intake/Output from previous day: 11/08 0701 - 11/09 0700 In: 970 [P.O.:720; I.V.:50; IV Piggyback:200] Out: 580 [Urine:500; Chest Tube:80] Intake/Output this shift: Total I/O In: 480 [P.O.:480] Out: 500 [Urine:500]  General appearance: alert and cooperative Heart: regular rate and rhythm, S1, S2 normal, no murmur, click, rub or gallop Lungs: clear to auscultation bilaterally Wound: incision ok chest tube output low and serous  Lab Results: Recent Labs    03/01/17 0458 03/02/17 0409  WBC 10.9* 7.8  HGB 10.7* 9.9*  HCT 33.6* 31.5*  PLT 209 181   BMET:  Recent Labs    03/01/17 0458 03/02/17 0409  NA 140 137  K 3.5 4.0  CL 109 105  CO2 27 28  GLUCOSE 81 240*  BUN 10 8  CREATININE 0.89 0.82  CALCIUM 7.9* 8.0*    PT/INR: No results for input(s): LABPROT, INR in the last 72 hours. ABG    Component Value Date/Time   TCO2 20 04/01/2013 1912   CBG (last 3)  Recent Labs    03/01/17 1952 03/02/17 0749 03/02/17 1130  GLUCAP 367* 176* 173*    Assessment/Plan: S/P Procedure(s) (LRB): PERICARDIAL WINDOW (N/A)  She remains hemodynamically stable and has been ambulating.  Pericardial tube output is low so it will probably be able to be removed tomorrow.   Cytology of fluid and pathology of pericardial specimen both still pending.  Continue ambulation and IS.   LOS: 2 days    [name] 03/02/2017

## 2017-03-02 NOTE — Progress Notes (Signed)
Nutrition Brief Note  Patient identified on the Malnutrition Screening Tool (MST) Report  Wt Readings from Last 15 Encounters:  03/02/17 254 lb 13.6 oz (115.6 kg)  05/06/16 260 lb (117.9 kg)  04/10/16 262 lb (118.8 kg)  04/07/16 262 lb (118.8 kg)  03/02/15 266 lb 7.5 oz (120.9 kg)  07/11/13 240 lb (108.9 kg)  10/04/12 248 lb (112.5 kg)  05/10/12 246 lb (111.6 kg)  04/25/12 245 lb (111.1 kg)  03/19/12 254 lb (115.2 kg)    Body mass index is 43.75 kg/m. Patient meets criteria for morbid obesity based on current BMI.   Current diet order is Heart Healthy, patient is consuming approximately 75% of meals at this time. Labs and medications reviewed. Pt may benefit from addition of Carb Modified to current diet order due to elevated CBGs, hx of DM  No further nutrition interventions warranted at this time. If nutrition issues arise, please consult RD.   Kerman Passey MS, RD, [place], [place] [phone] Pager  [phone] Weekend/On-Call Pager

## 2017-03-02 NOTE — Progress Notes (Signed)
Patient placed herself on CPAP.  RT verified that the O2 was connected. Patient resting comfortably at this time.     03/02/17 2136  BiPAP/CPAP/SIPAP  BiPAP/CPAP/SIPAP Pt Type Adult  Mask Type Nasal mask  Respiratory Rate 26 breaths/min  EPAP 10 cmH2O  Flow Rate 3 lpm  BiPAP/CPAP/SIPAP CPAP  Patient Home Equipment No  Auto Titrate No  BiPAP/CPAP /SiPAP Vitals  Bilateral Breath Sounds Clear;Diminished

## 2017-03-03 LAB — GLUCOSE, CAPILLARY
GLUCOSE-CAPILLARY: 66 mg/dL (ref 65–99)
Glucose-Capillary: 97 mg/dL (ref 65–99)
Glucose-Capillary: 136 mg/dL — ABNORMAL HIGH (ref 65–99)
Glucose-Capillary: 152 mg/dL — ABNORMAL HIGH (ref 65–99)
Glucose-Capillary: 213 mg/dL — ABNORMAL HIGH (ref 65–99)

## 2017-03-03 LAB — CBC
HCT: 30.6 % — ABNORMAL LOW (ref 36.0–46.0)
HEMOGLOBIN: 9.6 g/dL — AB (ref 12.0–15.0)
MCH: 26.4 pg (ref 26.0–34.0)
MCHC: 31.4 g/dL (ref 30.0–36.0)
MCV: 84.3 fL (ref 78.0–100.0)
Platelets: 194 10*3/uL (ref 150–400)
RBC: 3.63 MIL/uL — ABNORMAL LOW (ref 3.87–5.11)
RDW: 14.4 % (ref 11.5–15.5)
WBC: 6.6 10*3/uL (ref 4.0–10.5)

## 2017-03-03 LAB — BASIC METABOLIC PANEL
Anion gap: 3 — ABNORMAL LOW (ref 5–15)
BUN: 6 mg/dL (ref 6–20)
CHLORIDE: 105 mmol/L (ref 101–111)
CO2: 30 mmol/L (ref 22–32)
CREATININE: 0.78 mg/dL (ref 0.44–1.00)
Calcium: 8.2 mg/dL — ABNORMAL LOW (ref 8.9–10.3)
GFR calc Af Amer: >60 mL/min (ref >60)
GFR calc non Af Amer: >60 mL/min (ref >60)
Glucose, Bld: 147 mg/dL — ABNORMAL HIGH (ref 65–99)
Potassium: 3.9 mmol/L (ref 3.5–5.1)
SODIUM: 138 mmol/L (ref 135–145)

## 2017-03-03 MED ORDER — INSULIN GLARGINE 100 UNIT/ML ~~LOC~~ SOLN
60.0000 [IU] | Freq: Every day | SUBCUTANEOUS | Status: DC
  Administered 2017-03-03 – 2017-03-04 (×2): 60 [IU] via SUBCUTANEOUS
  Filled 2017-03-03 (×4): qty 0.6

## 2017-03-03 MED ORDER — GABAPENTIN 300 MG PO CAPS
900.0000 mg | ORAL_CAPSULE | Freq: Every day | ORAL | Status: DC
  Administered 2017-03-03 – 2017-03-04 (×2): 900 mg via ORAL
  Filled 2017-03-03 (×2): qty 3

## 2017-03-03 NOTE — Progress Notes (Signed)
      [address]       [place] [address]             [phone]      BP (!) 101/57   Pulse 63   Temp 97.7 F (36.5 C) (Oral)   Resp 20   Ht 5\' 4"  (1.626 m)   Wt 256 lb (116.1 kg)   SpO2 91%   BMI 43.94 kg/m   Intake/Output Summary (Last 24 hours) at 03/03/2017 1756 Last data filed at 03/03/2017 1600 Gross per 24 hour  Intake 720 ml  Output 950 ml  Net -230 ml   Minimal output from pericardial drain  Remo Lipps C. Roxan Hockey, MD [facility] [phone]

## 2017-03-03 NOTE — Progress Notes (Signed)
3 Days Post-Op Procedure(s) (LRB): PERICARDIAL WINDOW (N/A) Subjective: No complaints this AM Had some confusion last night  Objective: Vital signs in last 24 hours: Temp:  [97.9 F (36.6 C)-98.6 F (37 C)] 98.1 F (36.7 C) (11/10 0800) Pulse Rate:  [77-97] 83 (11/10 0800) Cardiac Rhythm: Normal sinus rhythm (11/10 0800) Resp:  [10-26] 12 (11/10 0800) BP: (71-150)/(59-97) 130/70 (11/10 0800) SpO2:  [84 %-100 %] 96 % (11/10 0932) Weight:  [256 lb (116.1 kg)] 256 lb (116.1 kg) (11/10 0600)  Hemodynamic parameters for last 24 hours:    Intake/Output from previous day: 11/09 0701 - 11/10 0700 In: 480 [P.O.:480] Out: 1500 [Urine:1400; Chest Tube:100] Intake/Output this shift: Total I/O In: 240 [P.O.:240] Out: -   General appearance: alert, cooperative and no distress Neurologic: intact Heart: regular rate and rhythm Lungs: diminished breath sounds bibasilar  Lab Results: Recent Labs    03/02/17 0409 03/03/17 0254  WBC 7.8 6.6  HGB 9.9* 9.6*  HCT 31.5* 30.6*  PLT 181 194   BMET:  Recent Labs    03/02/17 0409 03/03/17 0254  NA 137 138  K 4.0 3.9  CL 105 105  CO2 28 30  GLUCOSE 240* 147*  BUN 8 6  CREATININE 0.82 0.78  CALCIUM 8.0* 8.2*    PT/INR: No results for input(s): LABPROT, INR in the last 72 hours. ABG    Component Value Date/Time   TCO2 20 04/01/2013 1912   CBG (last 3)  Recent Labs    03/02/17 2118 03/03/17 0822 03/03/17 0918  GLUCAP 258* 66 97    Assessment/Plan: S/P Procedure(s) (LRB): PERICARDIAL WINDOW (N/A) -POD # 3 pericardial window Pericardial drain put 100 ml over night- will keep in place CBG better controlled Mobilize Confusion/ sundowning- minimize pain meds transfer to 4e when bed available   LOS: 3 days    Melrose Nakayama 03/03/2017

## 2017-03-04 ENCOUNTER — Inpatient Hospital Stay (HOSPITAL_COMMUNITY): Payer: Medicare Other

## 2017-03-04 LAB — GLUCOSE, CAPILLARY
Glucose-Capillary: 113 mg/dL — ABNORMAL HIGH (ref 65–99)
Glucose-Capillary: 158 mg/dL — ABNORMAL HIGH (ref 65–99)

## 2017-03-04 MED ORDER — FUROSEMIDE 20 MG PO TABS
20.0000 mg | ORAL_TABLET | Freq: Every day | ORAL | Status: DC
  Administered 2017-03-04: 20 mg via ORAL
  Filled 2017-03-04: qty 1

## 2017-03-04 MED ORDER — POTASSIUM CHLORIDE CRYS ER 10 MEQ PO TBCR
10.0000 meq | EXTENDED_RELEASE_TABLET | Freq: Every day | ORAL | Status: DC
  Administered 2017-03-04: 10 meq via ORAL
  Filled 2017-03-04: qty 1

## 2017-03-04 NOTE — Progress Notes (Addendum)
      [address]       [place] [address]             [phone]      4 Days Post-Op Procedure(s) (LRB): PERICARDIAL WINDOW (N/A)   Subjective:  Awoken from sleep.  No complaints.  Denies pain and shortness of breath.    Objective: Vital signs in last 24 hours: Temp:  [97.6 F (36.4 C)-98.5 F (36.9 C)] 98.5 F (36.9 C) (11/11 0519) Pulse Rate:  [63-85] 79 (11/11 0519) Cardiac Rhythm: Normal sinus rhythm;Bundle branch block (11/10 2130) Resp:  [12-20] 16 (11/11 0519) BP: (101-135)/(57-86) 119/77 (11/11 0519) SpO2:  [91 %-96 %] 95 % (11/11 0519) Weight:  [255 lb 4.8 oz (115.8 kg)] 255 lb 4.8 oz (115.8 kg) (11/10 2108)  Intake/Output from previous day: 11/10 0701 - 11/11 0700 In: 720 [P.O.:720] Out: 350 [Urine:350]  General appearance: alert, cooperative and no distress Heart: regular rate and rhythm Lungs: clear to auscultation bilaterally Abdomen: soft, non-tender; bowel sounds normal; no masses,  no organomegaly Extremities: edema trace Wound: clean and dry  Lab Results: Recent Labs    03/02/17 0409 03/03/17 0254  WBC 7.8 6.6  HGB 9.9* 9.6*  HCT 31.5* 30.6*  PLT 181 194   BMET:  Recent Labs    03/02/17 0409 03/03/17 0254  NA 137 138  K 4.0 3.9  CL 105 105  CO2 28 30  GLUCOSE 240* 147*  BUN 8 6  CREATININE 0.82 0.78  CALCIUM 8.0* 8.2*    PT/INR: No results for input(s): LABPROT, INR in the last 72 hours. ABG    Component Value Date/Time   TCO2 20 04/01/2013 1912   CBG (last 3)  Recent Labs    03/03/17 1644 03/03/17 2142 03/04/17 0645  GLUCAP 152* 213* 113*    Assessment/Plan: S/P Procedure(s) (LRB): PERICARDIAL WINDOW (N/A)  1. Chest tube- no output recorded for yesterday, will remove tube today 2. CV- NSR, hemodynamically stable 3. Pulm- CXR with increased pulmonary congestion, small bilateral effusions, continue IS, not on oxygen 4. Renal- creatinine WNL, mild elevation in weight, uses Lasix 20 mg as needed  for swelling, will restart today 5. DM- sugars are mostly controlled, continue current regimen 6. Dispo- patient stable, will d/c pericardial drain today, possibly for d/c in AM   LOS: 4 days    BARRETT, ERIN 03/04/2017 Patient seen and examined, agree with above  Remo Lipps C. Roxan Hockey, MD [facility] [phone]

## 2017-03-05 ENCOUNTER — Inpatient Hospital Stay (HOSPITAL_COMMUNITY): Payer: Medicare Other

## 2017-03-05 ENCOUNTER — Encounter: Payer: Self-pay | Admitting: Nurse Practitioner

## 2017-03-05 DIAGNOSIS — M7989 Other specified soft tissue disorders: Secondary | ICD-10-CM

## 2017-03-05 LAB — GLUCOSE, CAPILLARY
GLUCOSE-CAPILLARY: 113 mg/dL — AB (ref 65–99)
GLUCOSE-CAPILLARY: 306 mg/dL — AB (ref 65–99)
Glucose-Capillary: 193 mg/dL — ABNORMAL HIGH (ref 65–99)

## 2017-03-05 MED ORDER — FUROSEMIDE 40 MG PO TABS
40.0000 mg | ORAL_TABLET | Freq: Every day | ORAL | 0 refills | Status: DC
Start: 2017-03-06 — End: 2020-02-20

## 2017-03-05 MED ORDER — POTASSIUM CHLORIDE CRYS ER 20 MEQ PO TBCR
20.0000 meq | EXTENDED_RELEASE_TABLET | Freq: Every day | ORAL | Status: DC
  Administered 2017-03-05: 20 meq via ORAL
  Filled 2017-03-05: qty 1

## 2017-03-05 MED ORDER — POTASSIUM CHLORIDE CRYS ER 20 MEQ PO TBCR: 20.0000 meq | EXTENDED_RELEASE_TABLET | Freq: Every day | ORAL | 0 refills | Status: DC

## 2017-03-05 MED ORDER — ALPRAZOLAM 1 MG PO TABS: 1.0000 mg | ORAL_TABLET | Freq: Two times a day (BID) | ORAL | 0 refills | Status: DC | PRN

## 2017-03-05 MED ORDER — FUROSEMIDE 40 MG PO TABS
40.0000 mg | ORAL_TABLET | Freq: Every day | ORAL | Status: DC
  Administered 2017-03-05: 40 mg via ORAL
  Filled 2017-03-05: qty 1

## 2017-03-05 NOTE — Progress Notes (Signed)
Right upper extremity venous duplex has been completed. Negative for DVT.  Right lower extremity venous duplex has been completed. Negative for DVT.   Results were given to the patient's nurse, Helene Kelp.  03/05/17 3:35 PM [name] RVT

## 2017-03-05 NOTE — Care Management Note (Signed)
Case Management Note Marvetta Gibbons RN, BSN Unit 4E-Case Manager-- [place] coverage [phone]  Patient Details  Name: [name] MRN: [id number] Date of Birth: Jul 14, 1945  Subjective/Objective:  Pt admitted with tamponade- pericardial effusion- s/p pericardial window 02/28/17                  Action/Plan: PTA pt lived at home- CM to follow for d/c needs  Expected Discharge Date:  03/05/17               Expected Discharge Plan:  Home/Self Care  In-House Referral:  NA  Discharge planning Services  CM Consult  Post Acute Care Choice:  NA Choice offered to:  NA  DME Arranged:    DME Agency:     HH Arranged:    [place] Agency:     Status of Service:  Completed, signed off  If discussed at [place] of Stay Meetings, dates discussed:    Discharge Disposition: home/home health   Additional Comments:  03/05/17- 1620- Marvetta Gibbons RN, CM- pt for d/c home today- no CM needs noted for discharge.   Dawayne Patricia, RN 03/05/2017, 4:24 PM

## 2017-03-05 NOTE — Progress Notes (Addendum)
[address]       [place] [address]             [phone]      5 Days Post-Op Procedure(s) (LRB): PERICARDIAL WINDOW (N/A) Subjective: Feels pretty well, having some new swelling in right arm and leg. Intermit O2 use, has at home already as well as a sat monitor to titrate. Also has CPAP at home  Objective: Vital signs in last 24 hours: Temp:  [98 F (36.7 C)-98.3 F (36.8 C)] 98.3 F (36.8 C) (11/12 0418) Pulse Rate:  [73-83] 73 (11/12 0418) Cardiac Rhythm: Normal sinus rhythm (11/11 2105) Resp:  [16-25] 25 (11/12 0418) BP: (153-159)/(72-74) 153/74 (11/12 0418) SpO2:  [92 %-100 %] 100 % (11/12 0418)  Hemodynamic parameters for last 24 hours:    Intake/Output from previous day: No intake/output data recorded. Intake/Output this shift: No intake/output data recorded.  General appearance: alert, cooperative and no distress Heart: regular rate and rhythm and no rub Lungs: some exp wheeze and crackles Abdomen: benign Extremities: some right hand swelling, BLE mild edema Wound: incis healing well  Lab Results: Recent Labs    03/03/17 0254  WBC 6.6  HGB 9.6*  HCT 30.6*  PLT 194   BMET:  Recent Labs    03/03/17 0254  NA 138  K 3.9  CL 105  CO2 30  GLUCOSE 147*  BUN 6  CREATININE 0.78  CALCIUM 8.2*    PT/INR: No results for input(s): LABPROT, INR in the last 72 hours. ABG    Component Value Date/Time   TCO2 20 04/01/2013 1912   CBG (last 3)  Recent Labs    03/04/17 0645 03/04/17 1640 03/05/17 0642  GLUCAP 113* 158* 113*    Meds Scheduled Meds: . acetaminophen  1,000 mg Oral Q6H   Or  . acetaminophen (TYLENOL) oral liquid 160 mg/5 mL  1,000 mg Oral Q6H  . bisacodyl  10 mg Oral Daily  . carbidopa-levodopa  1 tablet Oral QHS  . Chlorhexidine Gluconate Cloth  6 each Topical Daily  . enoxaparin (LOVENOX) injection  40 mg Subcutaneous Q24H  . fentaNYL  100 mcg Transdermal Q3 days  . furosemide  20 mg Oral Daily  .  gabapentin  900 mg Oral QHS  . insulin aspart  0-15 Units Subcutaneous TID WC  . insulin glargine  60 Units Subcutaneous QHS  . mouth rinse  15 mL Mouth Rinse BID  . mometasone-formoterol  2 puff Inhalation BID  . pantoprazole  40 mg Oral Daily  . potassium chloride  10 mEq Oral Daily  . senna-docusate  1 tablet Oral QHS  . sertraline  100 mg Oral QHS   Continuous Infusions: . potassium chloride Stopped (03/01/17 1143)   PRN Meds:.albuterol, ALPRAZolam, fentaNYL (SUBLIMAZE) injection, ondansetron (ZOFRAN) IV, oxyCODONE, potassium chloride, sodium chloride flush  Xrays Dg Chest Port 1 View  Result Date: 03/04/2017 CLINICAL DATA:  Pericardial effusion. EXAM: PORTABLE CHEST 1 VIEW COMPARISON:  02/28/2017 FINDINGS: Right IJ central venous catheter in stable position. Mediastinal versus pericardial drain in place. Persistently enlarged cardiac silhouette. Pulmonary vascular congestion. Left lower lobe atelectasis versus airspace consolidation cannot be excluded. Bilateral small pleural effusions. Osseous structures are without acute abnormality. Soft tissues are grossly normal. IMPRESSION: Persistent enlargement of the cardiac silhouette. Pulmonary vascular congestion. Electronically Signed   By: Fidela Salisbury M.D.   On: 03/04/2017 07:17    Assessment/Plan: S/P Procedure(s) (LRB): PERICARDIAL WINDOW (N/A)  1 overall doing pretty  well 2 cont nebs and inhalers, cont lasix, somewhat increased right pleural effusion on CXR 3 will order venous duplex of upper and lower extrems 4 poss d/c later today    LOS: 5 days    GOLD,WAYNE E 03/05/2017   Chart reviewed, patient examined, agree with above. Duplex of right upper and lower extremities negative for DVT. She is feeling ok. She has a small right pleural effusion which she had on admission. It has not changed. She has had two prior thoracenteses on the right in the past that were negative for malignancy. Unclear if this is related to  CHF from pericardial effusion and tamponade or her lung cancer. I would continue Lasix 40 mg daily until I see her back to reassess the effusion. If it worsens then she would benefit from a PleurX catheter. She is going home today and will see her back in the office in a week or two.

## 2017-03-05 NOTE — Care Management Important Message (Signed)
Important Message  Patient Details  Name: [name] MRN: [id number] Date of Birth: 1945-06-18   Medicare Important Message Given:  Yes    Tempie Gibeault Montine Circle 03/05/2017, 2:55 PM

## 2017-03-09 ENCOUNTER — Other Ambulatory Visit: Payer: Self-pay

## 2017-03-09 ENCOUNTER — Ambulatory Visit (INDEPENDENT_AMBULATORY_CARE_PROVIDER_SITE_OTHER): Payer: Self-pay

## 2017-03-09 DIAGNOSIS — Z4802 Encounter for removal of sutures: Secondary | ICD-10-CM

## 2017-03-09 DIAGNOSIS — Z9889 Other specified postprocedural states: Secondary | ICD-10-CM

## 2017-03-09 NOTE — Progress Notes (Signed)
Patient presents for suture removal. The wound is well healed without signs of infection.  The sutures are removed. Wound care and activity instructions given. Return prn.

## 2017-03-19 ENCOUNTER — Ambulatory Visit: Payer: Medicare Other | Admitting: Nurse Practitioner

## 2017-03-19 NOTE — Progress Notes (Deleted)
CARDIOLOGY OFFICE NOTE  Date:  03/19/2017    [name] Date of Birth: 1946-02-08 Medical Record #[id number]  PCP:  [name] Graves, DO  Cardiologist:  [name]    No chief complaint on file.   History of Present Illness: [name] is a 71 y.o. female who presents today for a post hospital visit. Seen for Dr. Marlou [name].   She has a history of stage IV RUL non-small cell lung cancer treated at [facility] on current immunotherapy and has had prior history of thoracentesis, palliative radiation in March 2018, on clinical trial chemo initiated in April 2018 with chronic pain being treated at palliative care clinic. Her other issues include depression, diabetes, & hyperlipidemia. Noted that she apparently had been seen within the past couple of weeks at [facility] and told that she had some pericardial effusion but no treatment needed.   She then presented earlier this month with a several day history of progressive shortness of breath, cough, upper abdominal pain and abdominal distension. She had a CT of the chest which showed no PE but did show a large pericardial effusion and small bilateral pleural effusions. There was a 14 mm subcarinal lymph node that was previously 3 cm and a 2.2 cm right paratracheal node that was previously 1.7 cm. She had an echo in the ER showing a large circumferential pericardial effusion with some fibrin stranding and some RV collapse and features consistent with tamponade. LVEF 65-70%. Cardiac valves look ok.    She was admitted and underwent subxiphoid pericardial window for stage IV lung cancer with a large pericardial effusion with Dr. Cyndia [name]. Post op course was unremarkable.   Comes in today. Here with   Past Medical History:  Diagnosis Date  . Asthma   . Bleeding nose   . Cancer ([facility]) 2017   Lung   . Diabetes mellitus   . Diverticulitis   . H. pylori infection   . Hypertension   . Lung nodule 2017   Both lungs-pt at [facility].   . Parkinson  disease ([place]) 2010  . Tubular adenoma of colon 05/2007    Past Surgical History:  Procedure Laterality Date  . APPENDECTOMY    . CHOLECYSTECTOMY    . COLON SURGERY     colectomy and reversal after diverticular ds  . PARTIAL COLECTOMY    . PERICARDIAL WINDOW N/A 02/28/2017   Procedure: PERICARDIAL WINDOW;  Surgeon: [name] Pollack, MD;  Location: [facility] OR;  Service: Thoracic;  Laterality: N/A;  . TONSILLECTOMY    . TOTAL KNEE ARTHROPLASTY     right  . VAGINAL HYSTERECTOMY       Medications: No outpatient medications have been marked as taking for the 03/19/17 encounter (Appointment) with [name] Junes, NP.     Allergies: Allergies  Allergen Reactions  . Morphine And Related Nausea And Vomiting and Other (See Comments)    Headache (also)  . Tape Rash    Can only tolerate for short amounts of time (CANNOT BE "LEFT" ON THE SAME SITE FOR LONG)    Social History: The patient  reports that she quit smoking about 30 years ago. she has never used smokeless tobacco. She reports that she does not drink alcohol or use drugs.   Family History: The patient's family history includes Diabetes Mellitus II in her sister and son.   Review of Systems: Please see the history of present illness.   Otherwise, the review of systems is positive for none.  All other systems are reviewed and negative.   Physical Exam: VS:  There were no vitals taken for this visit. Marland Kitchen  BMI There is no height or weight on file to calculate BMI.  Wt Readings from Last 3 Encounters:  03/03/17 255 lb 4.8 oz (115.8 kg)  05/06/16 260 lb (117.9 kg)  04/10/16 262 lb (118.8 kg)    General: Pleasant. Well developed, well nourished and in no acute distress.   HEENT: Normal.  Neck: Supple, no JVD, carotid bruits, or masses noted.  Cardiac: Regular rate and rhythm. No murmurs, rubs, or gallops. No edema.  Respiratory:  Lungs are clear to auscultation bilaterally with normal work of breathing.  GI: Soft and nontender.    MS: No deformity or atrophy. Gait and ROM intact.  Skin: Warm and dry. Color is normal.  Neuro:  Strength and sensation are intact and no gross focal deficits noted.  Psych: Alert, appropriate and with normal affect.   LABORATORY DATA:  EKG:  EKG is ordered today. This demonstrates .  Lab Results  Component Value Date   WBC 6.6 03/03/2017   HGB 9.6 (L) 03/03/2017   HCT 30.6 (L) 03/03/2017   PLT 194 03/03/2017   GLUCOSE 147 (H) 03/03/2017   CHOL  10/01/2009    153        ATP III CLASSIFICATION:  <200     mg/dL   Desirable  200-239  mg/dL   Borderline High  >=240    mg/dL   High          TRIG 272 (H) 10/01/2009   HDL 37 (L) 10/01/2009   LDLCALC  10/01/2009    62        Total Cholesterol/HDL:CHD Risk Coronary Heart Disease Risk Table                     Men   Women  1/2 Average Risk   3.4   3.3  Average Risk       5.0   4.4  2 X Average Risk   9.6   7.1  3 X Average Risk  23.4   11.0        Use the calculated Patient Ratio above and the CHD Risk Table to determine the patient's CHD Risk.        ATP III CLASSIFICATION (LDL):  <100     mg/dL   Optimal  100-129  mg/dL   Near or Above                    Optimal  130-159  mg/dL   Borderline  160-189  mg/dL   High  >190     mg/dL   Very High   ALT 12 (L) 03/01/2017   AST 34 03/01/2017   NA 138 03/03/2017   K 3.9 03/03/2017   CL 105 03/03/2017   CREATININE 0.78 03/03/2017   BUN 6 03/03/2017   CO2 30 03/03/2017   TSH 0.537 Test methodology is 3rd generation TSH 09/30/2009   INR 0.92 07/11/2013   HGBA1C (H) 08/25/2010    7.5 (NOTE)  According to the ADA Clinical Practice Recommendations for 2011, when HbA1c is used as a screening test:   >=6.5%   Diagnostic of Diabetes Mellitus           (if abnormal result  is confirmed)  5.7-6.4%   Increased risk of developing Diabetes Mellitus  References:Diagnosis and Classification of Diabetes Mellitus,Diabetes  DPOE,4235,36(RWERX 1):S62-S69 and Standards of Medical Care in         Diabetes - 2011,Diabetes VQMG,8676,19  (Suppl 1):S11-S61.     BNP (last 3 results) Recent Labs    02/28/17 1005  BNP 104.3*    ProBNP (last 3 results) No results for input(s): PROBNP in the last 8760 hours.   Other Studies Reviewed Today:  Echo Study Conclusions 02/2017  - Left ventricle: The cavity size was normal. There was mild   concentric hypertrophy. Systolic function was vigorous. The   estimated ejection fraction was in the range of 65% to 70%. Wall   motion was normal; there were no regional wall motion   abnormalities. - Aortic valve: Transvalvular velocity was within the normal range.   There was no stenosis. There was no regurgitation. - Mitral valve: Transvalvular velocity was within the normal range.   There was no evidence for stenosis. There was no regurgitation. - Right ventricle: The cavity size was normal. Wall thickness was   normal. Systolic function was normal. - Tricuspid valve: There was no regurgitation. - Pericardium, extracardiac: A large pericardial effusion was   identified circumferential to the heart. Measuring up to 4.6 cm.   The effusion appears loculated with a large area of what appears   to be clot (10cm x 3.0 cm) adjacent to the RV free wall and apex.   There was right ventricular chamber collapse. There was evidence   for increased RV-LV interaction demonstrated by respirophasic   changes in transmitral velocities. Features were consistent with   tamponade physiology.  Recommendations:  Cardiothoracic surgery consultation. This was related to the inpatient cardiology service. Assessment/Plan: 1. S/P pericardial window for large pericardial effusion - in the setting of underlying metastatic cancer  2. Stage IV lung cancer - managed at [facility] - palliative XRT, trial therapy chemo  3. OSA - on CPAP  Current medicines are reviewed with the patient today.  The  patient does not have concerns regarding medicines other than what has been noted above.  The following changes have been made:  See above.  Labs/ tests ordered today include:   No orders of the defined types were placed in this encounter.    Disposition:   FU with *** in {gen number 5-09:[id number]} {Days to years:10300}.   Patient is agreeable to this plan and will call if any problems develop in the interim.   SignedTruitt Merle, NP  03/19/2017 10:53 AM  [facility] [address] [place], [place]  [address] Phone: [phone] Fax: [phone]

## 2017-03-20 ENCOUNTER — Encounter: Payer: Self-pay | Admitting: Nurse Practitioner

## 2017-03-21 ENCOUNTER — Other Ambulatory Visit: Payer: Self-pay

## 2017-03-21 ENCOUNTER — Other Ambulatory Visit: Payer: Self-pay | Admitting: Surgery

## 2017-03-21 ENCOUNTER — Ambulatory Visit
Admission: RE | Admit: 2017-03-21 | Discharge: 2017-03-21 | Disposition: A | Discharge status: Home / Self Care | Payer: Medicare Other | Source: Ambulatory Visit | Attending: Surgery | Admitting: Surgery

## 2017-03-21 ENCOUNTER — Ambulatory Visit (INDEPENDENT_AMBULATORY_CARE_PROVIDER_SITE_OTHER): Payer: Self-pay | Admitting: Surgery

## 2017-03-21 ENCOUNTER — Encounter: Payer: Self-pay | Admitting: Surgery

## 2017-03-21 VITALS — BP 126/74 | HR 94 | Ht 64.0 in | Wt 232.0 lb

## 2017-03-21 DIAGNOSIS — I313 Pericardial effusion (noninflammatory): Principal | ICD-10-CM

## 2017-03-21 DIAGNOSIS — I318 Other specified diseases of pericardium: Secondary | ICD-10-CM

## 2017-03-21 DIAGNOSIS — I3139 Other pericardial effusion (noninflammatory): Secondary | ICD-10-CM

## 2017-03-21 DIAGNOSIS — C801 Malignant (primary) neoplasm, unspecified: Secondary | ICD-10-CM

## 2017-03-21 DIAGNOSIS — I3131 Malignant pericardial effusion in diseases classified elsewhere: Secondary | ICD-10-CM

## 2017-03-25 ENCOUNTER — Encounter: Payer: Self-pay | Admitting: Surgery

## 2017-03-25 NOTE — Progress Notes (Signed)
HPI: Patient returns for routine postoperative follow-up having undergone subxyphoid pericardial window on 02/28/2017. She had 1500 cc of bloody pericardial fluid removed.  The patient's early postoperative recovery while in the hospital was notable for an uncomplicated postop course. The cytology on the pericardial fluid revealed malignant cells consistent with adenocarcinoma of the lung. The pericardial specimen showed no malignant cells. Since hospital discharge the patient reports that she has been feeling fairly well with no shortness of breath.   Current Outpatient Medications  Medication Sig Dispense Refill  . ALPRAZolam (XANAX) 1 MG tablet Take 1 tablet (1 mg total) 2 (two) times daily as needed by mouth for anxiety. 10 tablet 0  . carbidopa-levodopa (SINEMET IR) 25-250 MG tablet Take 1 tablet by mouth at bedtime.    . fentaNYL (DURAGESIC - DOSED MCG/HR) 100 MCG/HR Place 100 mcg every 3 (three) days onto the skin.    Marland Kitchen Fluticasone-Salmeterol (ADVAIR) 500-50 MCG/DOSE AEPB Inhale 1 puff into the lungs every 12 (twelve) hours.      . furosemide (LASIX) 40 MG tablet Take 1 tablet (40 mg total) daily by mouth. 30 tablet 0  . gabapentin (NEURONTIN) 300 MG capsule Take 900 mg at bedtime by mouth.    . hydrocortisone 2.5 % cream Apply 1 application as needed topically.    . insulin glargine (LANTUS) 100 UNIT/ML injection Inject 80 Units at bedtime into the skin.     Marland Kitchen omeprazole (PRILOSEC) 40 MG capsule Take 1 capsule (40 mg total) by mouth 2 (two) times daily. 28 capsule 0  . Oxycodone HCl 20 MG TABS Take 20-40 mg every 4 (four) hours as needed by mouth for pain.    . potassium chloride SA (K-DUR,KLOR-CON) 20 MEQ tablet Take 1 tablet (20 mEq total) daily by mouth. 30 tablet 0  . PROAIR HFA 108 (90 BASE) MCG/ACT inhaler Inhale 2 puffs into the lungs every 4 (four) hours as needed for wheezing or shortness of breath.     . promethazine (PHENERGAN) 12.5 MG tablet Take 12.5 mg as needed by  mouth.    . sertraline (ZOLOFT) 100 MG tablet Take 100 mg by mouth daily.    Marland Kitchen tiZANidine (ZANAFLEX) 4 MG tablet Take 4 mg by mouth every 6 (six) hours as needed for muscle spasms.     No current facility-administered medications for this visit.     Physical Exam: BP 126/74 (BP Location: Left Arm, Patient Position: Sitting, Cuff Size: Large)   Pulse 94   Ht 5\' 4"  (1.626 m)   Wt 232 lb (105.2 kg)   SpO2 95%   BMI 39.82 kg/m  She looks well Cardiac exam shows a regular rate and rhythm with normal heart sounds Lungs are clear The incision is healing well.  Diagnostic Tests:  CLINICAL DATA:  Hx of pericardial window 02/28/2017, some s.o.b and fatigue now, stat reading, pt gone to office now for appt  EXAM: CHEST  2 VIEW  COMPARISON:  03/05/2017  FINDINGS: Right IJ line has been removed. The heart size is normal. There are no focal consolidations or pleural effusions. No pulmonary edema. Remote left rib fracture. There is moderate midthoracic spondylosis.  IMPRESSION: No active cardiopulmonary disease.   Electronically Signed   By: Nolon Nations M.D.   On: 03/21/2017 16:21   Impression:  She is recovering well following pericardial window for a malignant pericardial effusion. Her cardiac shadow on CXR is normal sized compared to preop so I don't think there has been any recurrence  of the effusion. I asked her not to do any strenuous activity or lifting more than 10 lbs for 8 weeks postop.  Plan:  She will continue to follow up with her oncologist at [facility] and her PCP. She will return to see me if she develops any problems with her incisions.   [name], MD [facility] [phone]

## 2017-04-02 ENCOUNTER — Other Ambulatory Visit: Payer: Self-pay | Admitting: Surgical

## 2017-06-19 ENCOUNTER — Observation Stay (HOSPITAL_COMMUNITY)
Admission: EM | Admit: 2017-06-19 | Discharge: 2017-06-20 | Disposition: A | Discharge status: Home / Self Care | Payer: Medicare Other | Attending: Internal Medicine | Admitting: Internal Medicine

## 2017-06-19 ENCOUNTER — Emergency Department (HOSPITAL_COMMUNITY): Payer: Medicare Other

## 2017-06-19 ENCOUNTER — Other Ambulatory Visit: Payer: Self-pay

## 2017-06-19 ENCOUNTER — Encounter (HOSPITAL_COMMUNITY): Payer: Self-pay | Admitting: Emergency Medicine

## 2017-06-19 DIAGNOSIS — I700 Atherosclerosis of aorta: Secondary | ICD-10-CM | POA: Insufficient documentation

## 2017-06-19 DIAGNOSIS — J438 Other emphysema: Principal | ICD-10-CM | POA: Insufficient documentation

## 2017-06-19 DIAGNOSIS — C3490 Malignant neoplasm of unspecified part of unspecified bronchus or lung: Secondary | ICD-10-CM | POA: Insufficient documentation

## 2017-06-19 DIAGNOSIS — I110 Hypertensive heart disease with heart failure: Secondary | ICD-10-CM | POA: Insufficient documentation

## 2017-06-19 DIAGNOSIS — Z87891 Personal history of nicotine dependence: Secondary | ICD-10-CM | POA: Diagnosis not present

## 2017-06-19 DIAGNOSIS — I4510 Unspecified right bundle-branch block: Secondary | ICD-10-CM | POA: Diagnosis not present

## 2017-06-19 DIAGNOSIS — F411 Generalized anxiety disorder: Secondary | ICD-10-CM | POA: Diagnosis not present

## 2017-06-19 DIAGNOSIS — E785 Hyperlipidemia, unspecified: Secondary | ICD-10-CM | POA: Diagnosis not present

## 2017-06-19 DIAGNOSIS — Z66 Do not resuscitate: Secondary | ICD-10-CM | POA: Diagnosis not present

## 2017-06-19 DIAGNOSIS — Z794 Long term (current) use of insulin: Secondary | ICD-10-CM | POA: Insufficient documentation

## 2017-06-19 DIAGNOSIS — I509 Heart failure, unspecified: Secondary | ICD-10-CM | POA: Diagnosis not present

## 2017-06-19 DIAGNOSIS — G20 Parkinson's disease: Secondary | ICD-10-CM

## 2017-06-19 DIAGNOSIS — E1142 Type 2 diabetes mellitus with diabetic polyneuropathy: Secondary | ICD-10-CM | POA: Insufficient documentation

## 2017-06-19 DIAGNOSIS — R011 Cardiac murmur, unspecified: Secondary | ICD-10-CM | POA: Insufficient documentation

## 2017-06-19 DIAGNOSIS — Z885 Allergy status to narcotic agent status: Secondary | ICD-10-CM | POA: Insufficient documentation

## 2017-06-19 DIAGNOSIS — Z9049 Acquired absence of other specified parts of digestive tract: Secondary | ICD-10-CM | POA: Insufficient documentation

## 2017-06-19 DIAGNOSIS — J45909 Unspecified asthma, uncomplicated: Secondary | ICD-10-CM | POA: Diagnosis present

## 2017-06-19 DIAGNOSIS — I2510 Atherosclerotic heart disease of native coronary artery without angina pectoris: Secondary | ICD-10-CM | POA: Insufficient documentation

## 2017-06-19 DIAGNOSIS — G4733 Obstructive sleep apnea (adult) (pediatric): Secondary | ICD-10-CM | POA: Insufficient documentation

## 2017-06-19 DIAGNOSIS — K219 Gastro-esophageal reflux disease without esophagitis: Secondary | ICD-10-CM | POA: Diagnosis present

## 2017-06-19 DIAGNOSIS — R0902 Hypoxemia: Secondary | ICD-10-CM | POA: Insufficient documentation

## 2017-06-19 DIAGNOSIS — J432 Centrilobular emphysema: Secondary | ICD-10-CM | POA: Insufficient documentation

## 2017-06-19 DIAGNOSIS — Z96651 Presence of right artificial knee joint: Secondary | ICD-10-CM | POA: Insufficient documentation

## 2017-06-19 DIAGNOSIS — J441 Chronic obstructive pulmonary disease with (acute) exacerbation: Secondary | ICD-10-CM | POA: Diagnosis present

## 2017-06-19 DIAGNOSIS — Z888 Allergy status to other drugs, medicaments and biological substances status: Secondary | ICD-10-CM | POA: Diagnosis not present

## 2017-06-19 DIAGNOSIS — Z79899 Other long term (current) drug therapy: Secondary | ICD-10-CM | POA: Diagnosis not present

## 2017-06-19 DIAGNOSIS — R0602 Shortness of breath: Secondary | ICD-10-CM | POA: Diagnosis present

## 2017-06-19 DIAGNOSIS — G20A1 Parkinson's disease without dyskinesia, without mention of fluctuations: Secondary | ICD-10-CM

## 2017-06-19 DIAGNOSIS — J449 Chronic obstructive pulmonary disease, unspecified: Secondary | ICD-10-CM | POA: Diagnosis present

## 2017-06-19 LAB — COMPREHENSIVE METABOLIC PANEL
ALBUMIN: 3 g/dL — AB (ref 3.5–5.0)
ALT: 10 U/L — ABNORMAL LOW (ref 14–54)
AST: 14 U/L — AB (ref 15–41)
Alkaline Phosphatase: 102 U/L (ref 38–126)
Anion gap: 12 (ref 5–15)
BUN: 10 mg/dL (ref 6–20)
CHLORIDE: 101 mmol/L (ref 101–111)
CO2: 24 mmol/L (ref 22–32)
Calcium: 8.8 mg/dL — ABNORMAL LOW (ref 8.9–10.3)
Creatinine, Ser: 0.71 mg/dL (ref 0.44–1.00)
GFR calc Af Amer: >60 mL/min (ref >60)
GFR calc non Af Amer: >60 mL/min (ref >60)
GLUCOSE: 297 mg/dL — AB (ref 65–99)
POTASSIUM: 4.1 mmol/L (ref 3.5–5.1)
SODIUM: 137 mmol/L (ref 135–145)
Total Bilirubin: 0.5 mg/dL (ref 0.3–1.2)
Total Protein: 7 g/dL (ref 6.5–8.1)

## 2017-06-19 LAB — CBG MONITORING, ED: Glucose-Capillary: >600 mg/dL (ref 65–99)

## 2017-06-19 LAB — I-STAT TROPONIN, ED: Troponin i, poc: 0 ng/mL (ref 0.00–0.08)

## 2017-06-19 LAB — CBC
HCT: 39.5 % (ref 36.0–46.0)
Hemoglobin: 12.8 g/dL (ref 12.0–15.0)
MCH: 27.2 pg (ref 26.0–34.0)
MCHC: 32.4 g/dL (ref 30.0–36.0)
MCV: 84 fL (ref 78.0–100.0)
PLATELETS: 209 10*3/uL (ref 150–400)
RBC: 4.7 MIL/uL (ref 3.87–5.11)
RDW: 14.4 % (ref 11.5–15.5)
WBC: 7.5 10*3/uL (ref 4.0–10.5)

## 2017-06-19 LAB — BRAIN NATRIURETIC PEPTIDE: B NATRIURETIC PEPTIDE 5: 40.1 pg/mL (ref 0.0–100.0)

## 2017-06-19 LAB — INFLUENZA PANEL BY PCR (TYPE A & B)
INFLBPCR: NEGATIVE
Influenza A By PCR: NEGATIVE

## 2017-06-19 MED ORDER — FUROSEMIDE 40 MG PO TABS: 40.0000 mg | ORAL_TABLET | Freq: Every day | ORAL | Status: DC | PRN

## 2017-06-19 MED ORDER — MOMETASONE FURO-FORMOTEROL FUM 200-5 MCG/ACT IN AERO
2.0000 | INHALATION_SPRAY | Freq: Two times a day (BID) | RESPIRATORY_TRACT | Status: DC
  Filled 2017-06-19: qty 8.8

## 2017-06-19 MED ORDER — ALBUTEROL SULFATE (2.5 MG/3ML) 0.083% IN NEBU: 2.5000 mg | INHALATION_SOLUTION | RESPIRATORY_TRACT | Status: DC | PRN

## 2017-06-19 MED ORDER — INSULIN ASPART 100 UNIT/ML ~~LOC~~ SOLN: 0.0000 [IU] | Freq: Every day | SUBCUTANEOUS | Status: DC

## 2017-06-19 MED ORDER — SERTRALINE HCL 100 MG PO TABS
100.0000 mg | ORAL_TABLET | Freq: Every day | ORAL | Status: DC
  Administered 2017-06-19 – 2017-06-20 (×2): 100 mg via ORAL
  Filled 2017-06-19 (×2): qty 1

## 2017-06-19 MED ORDER — PANTOPRAZOLE SODIUM 40 MG PO TBEC
40.0000 mg | DELAYED_RELEASE_TABLET | Freq: Every day | ORAL | Status: DC
Start: 2017-06-20 — End: 2017-06-20
  Administered 2017-06-20: 40 mg via ORAL
  Filled 2017-06-19: qty 1

## 2017-06-19 MED ORDER — SENNA 8.6 MG PO TABS
1.0000 | ORAL_TABLET | Freq: Every day | ORAL | Status: DC | PRN
  Filled 2017-06-19: qty 1

## 2017-06-19 MED ORDER — POLYETHYLENE GLYCOL 3350 17 G PO PACK: 17.0000 g | PACK | Freq: Every day | ORAL | Status: DC | PRN

## 2017-06-19 MED ORDER — AZITHROMYCIN 250 MG PO TABS
500.0000 mg | ORAL_TABLET | Freq: Every day | ORAL | Status: AC
  Administered 2017-06-19: 500 mg via ORAL
  Filled 2017-06-19: qty 2

## 2017-06-19 MED ORDER — INSULIN ASPART 100 UNIT/ML ~~LOC~~ SOLN
0.0000 [IU] | Freq: Three times a day (TID) | SUBCUTANEOUS | Status: DC
  Administered 2017-06-20: 15 [IU] via SUBCUTANEOUS
  Filled 2017-06-19: qty 1

## 2017-06-19 MED ORDER — PREDNISONE 20 MG PO TABS
40.0000 mg | ORAL_TABLET | Freq: Every day | ORAL | Status: DC
  Administered 2017-06-20: 40 mg via ORAL
  Filled 2017-06-19: qty 2

## 2017-06-19 MED ORDER — IOPAMIDOL (ISOVUE-370) INJECTION 76%
INTRAVENOUS | Status: AC
  Administered 2017-06-19: 100 mL
  Filled 2017-06-19: qty 100

## 2017-06-19 MED ORDER — INSULIN ASPART 100 UNIT/ML ~~LOC~~ SOLN
20.0000 [IU] | Freq: Once | SUBCUTANEOUS | Status: AC
  Administered 2017-06-19: 20 [IU] via SUBCUTANEOUS
  Filled 2017-06-19: qty 1

## 2017-06-19 MED ORDER — INSULIN GLARGINE 100 UNIT/ML ~~LOC~~ SOLN
65.0000 [IU] | Freq: Every day | SUBCUTANEOUS | Status: DC
  Filled 2017-06-19: qty 0.65

## 2017-06-19 MED ORDER — OXYCODONE HCL 5 MG PO TABS
20.0000 mg | ORAL_TABLET | ORAL | Status: DC | PRN
  Administered 2017-06-20: 20 mg via ORAL
  Filled 2017-06-19: qty 8

## 2017-06-19 MED ORDER — ACETAMINOPHEN 325 MG PO TABS: 650.0000 mg | ORAL_TABLET | Freq: Four times a day (QID) | ORAL | Status: DC | PRN

## 2017-06-19 MED ORDER — GABAPENTIN 300 MG PO CAPS
900.0000 mg | ORAL_CAPSULE | Freq: Every day | ORAL | Status: DC
  Administered 2017-06-19: 900 mg via ORAL
  Filled 2017-06-19: qty 3

## 2017-06-19 MED ORDER — ALPRAZOLAM 0.5 MG PO TABS
1.0000 mg | ORAL_TABLET | Freq: Two times a day (BID) | ORAL | Status: DC | PRN
  Administered 2017-06-19: 1 mg via ORAL
  Filled 2017-06-19: qty 4

## 2017-06-19 MED ORDER — IPRATROPIUM-ALBUTEROL 0.5-2.5 (3) MG/3ML IN SOLN
3.0000 mL | Freq: Four times a day (QID) | RESPIRATORY_TRACT | Status: DC
  Administered 2017-06-19 – 2017-06-20 (×3): 3 mL via RESPIRATORY_TRACT
  Filled 2017-06-19 (×3): qty 3

## 2017-06-19 MED ORDER — METHYLPREDNISOLONE SODIUM SUCC 125 MG IJ SOLR
125.0000 mg | Freq: Once | INTRAMUSCULAR | Status: AC
  Administered 2017-06-19: 125 mg via INTRAVENOUS
  Filled 2017-06-19: qty 2

## 2017-06-19 MED ORDER — ACETAMINOPHEN 650 MG RE SUPP: 650.0000 mg | Freq: Four times a day (QID) | RECTAL | Status: DC | PRN

## 2017-06-19 MED ORDER — AZITHROMYCIN 250 MG PO TABS
250.0000 mg | ORAL_TABLET | Freq: Every day | ORAL | Status: DC
  Administered 2017-06-20: 250 mg via ORAL
  Filled 2017-06-19: qty 1

## 2017-06-19 MED ORDER — INSULIN ASPART 100 UNIT/ML ~~LOC~~ SOLN: 0.0000 [IU] | Freq: Three times a day (TID) | SUBCUTANEOUS | Status: DC

## 2017-06-19 MED ORDER — FENTANYL 25 MCG/HR TD PT72: 100.0000 ug | MEDICATED_PATCH | TRANSDERMAL | Status: DC

## 2017-06-19 MED ORDER — INSULIN ASPART 100 UNIT/ML ~~LOC~~ SOLN: 6.0000 [IU] | Freq: Three times a day (TID) | SUBCUTANEOUS | Status: DC

## 2017-06-19 MED ORDER — INSULIN ASPART 100 UNIT/ML ~~LOC~~ SOLN
0.0000 [IU] | Freq: Every day | SUBCUTANEOUS | Status: DC
  Filled 2017-06-19: qty 1

## 2017-06-19 MED ORDER — IPRATROPIUM-ALBUTEROL 0.5-2.5 (3) MG/3ML IN SOLN
3.0000 mL | Freq: Once | RESPIRATORY_TRACT | Status: AC
  Administered 2017-06-19: 3 mL via RESPIRATORY_TRACT
  Filled 2017-06-19: qty 3

## 2017-06-19 MED ORDER — ALBUTEROL SULFATE (2.5 MG/3ML) 0.083% IN NEBU
5.0000 mg | INHALATION_SOLUTION | Freq: Once | RESPIRATORY_TRACT | Status: AC
  Administered 2017-06-19: 5 mg via RESPIRATORY_TRACT
  Filled 2017-06-19: qty 6

## 2017-06-19 MED ORDER — INSULIN GLARGINE 100 UNIT/ML ~~LOC~~ SOLN
15.0000 [IU] | Freq: Once | SUBCUTANEOUS | Status: AC
  Administered 2017-06-19: 15 [IU] via SUBCUTANEOUS
  Filled 2017-06-19: qty 0.15

## 2017-06-19 MED ORDER — INSULIN GLARGINE 100 UNIT/ML ~~LOC~~ SOLN
50.0000 [IU] | Freq: Every day | SUBCUTANEOUS | Status: DC
  Administered 2017-06-19: 50 [IU] via SUBCUTANEOUS
  Filled 2017-06-19: qty 0.5

## 2017-06-19 MED ORDER — CARBIDOPA-LEVODOPA 25-250 MG PO TABS
1.0000 | ORAL_TABLET | Freq: Every day | ORAL | Status: DC
  Administered 2017-06-19: 1 via ORAL
  Filled 2017-06-19 (×2): qty 1

## 2017-06-19 MED ORDER — HEPARIN SODIUM (PORCINE) 5000 UNIT/ML IJ SOLN
5000.0000 [IU] | Freq: Three times a day (TID) | INTRAMUSCULAR | Status: DC
  Administered 2017-06-20 (×2): 5000 [IU] via SUBCUTANEOUS
  Filled 2017-06-19 (×2): qty 1

## 2017-06-19 NOTE — H&P (Signed)
Date: 06/19/2017               Patient Name:  [name] MRN: [id number]  DOB: 11-17-1945 Age / Sex: 72 y.o., female   PCP: Octavio Graves, DO         Medical Service: Internal Medicine Teaching Service         Attending Physician: Dr. Oval Linsey, MD    First Contact: Dr. Shan Levans Pager: [phone]  Second Contact: Dr. Reesa Chew Pager: [phone]       After Hours (After 5p/  First Contact Pager: [phone]  weekends / holidays): Second Contact Pager: [phone]   Chief Complaint: Shortness of breath  History of Present Illness: Kelie Gainey is a 76 yoF with stage IV non-small cell carcinoma of the lung being treated at [facility] with bimonthly immunotherapy. Her PMHx is significant for asthma, chronic tobacco abuse, insulin dependent diabetes, HTN, HLD, and Parkinson's disease. She presents with a five day history of dyspnea and one month of cough. The cough was initially productive with thick, stringy green/gray mucus chunks regularly produced. This abruptly ended when the dyspnea began. She is on 2L via [place] of O2 at home which when applied only improved her hypoxia to 91%. Without the O2 she would range from 80-90% on room air with ambulation and ~88% at rest. She was told that her lungs sounded clear at each visit with her oncologist. Any significant movement worsens the dyspnea as well as lying flat causing her to utilize an additional pillow for a total of three while sleeping. He albuterol rescue inhalers were of no significant benefit although she has greatly increased their usage.   She attested to increased audible wheezing, nausea of >2 months, chills, one recent incident of bluury vision in her right eye and generalized mild fatigue. She denied fever, vomiting, myalgias, abdominal pain, urinary frequency or decreased urine, dysuria, hematuria, hematochezia, melena, hematemesis, diarrhea, constipation, chest pain, or peripheral edema.   ED Course: CXR was unremarkable. CT angio  without acute PE, no pericardial effusion, or pneumonia. There was mild centrilobular and paraseptal emphysema suggestive of underlying COPD. CBC was unremarkable without anemia or leukocytosis. CMP indicated only an abnormal value with an elevated serum glucose to 297, she had received a dose of solumedrol 125mg  in the ED however. Troponin was negative with the EKG being unremarkable for changes commonly consistent with a STEMI. IMTS was appropriately consulted to admission of this patient due to her increased oxygen demand thought to most likely be the result of a COPD exacerbation and underlying comorbid conditions.   Meds:  Current Meds  Medication Sig  . ALPRAZolam (XANAX) 1 MG tablet Take 1 tablet (1 mg total) 2 (two) times daily as needed by mouth for anxiety.  . carbidopa-levodopa (SINEMET IR) 25-250 MG tablet Take 1 tablet by mouth at bedtime.  . fentaNYL (DURAGESIC - DOSED MCG/HR) 100 MCG/HR Place 100 mcg every 3 (three) days onto the skin.  Marland Kitchen Fluticasone-Salmeterol (ADVAIR) 500-50 MCG/DOSE AEPB Inhale 1 puff into the lungs every 12 (twelve) hours.    . furosemide (LASIX) 40 MG tablet Take 1 tablet (40 mg total) daily by mouth. (Patient taking differently: Take 40 mg by mouth daily as needed for fluid. )  . gabapentin (NEURONTIN) 300 MG capsule Take 900 mg at bedtime by mouth.  Marland Kitchen HUMALOG KWIKPEN 100 UNIT/ML KiwkPen Inject 0-10 Units into the skin 3 (three) times daily before meals. Per sliding scale  . hydrocortisone 2.5 % cream Apply  1 application topically as needed (itching).   . insulin glargine (LANTUS) 100 UNIT/ML injection Inject 65 Units into the skin at bedtime.   Marland Kitchen omeprazole (PRILOSEC) 40 MG capsule Take 1 capsule (40 mg total) by mouth 2 (two) times daily. (Patient taking differently: Take 40 mg by mouth daily as needed (heartburn). )  . Oxycodone HCl 20 MG TABS Take 20-40 mg every 4 (four) hours as needed by mouth for pain.  Marland Kitchen PROAIR HFA 108 (90 BASE) MCG/ACT inhaler Inhale 2  puffs into the lungs every 4 (four) hours as needed for wheezing or shortness of breath.   . sertraline (ZOLOFT) 100 MG tablet Take 100 mg by mouth daily.   Allergies: Allergies as of 06/19/2017 - Review Complete 06/19/2017  Allergen Reaction Noted  . Morphine and related Nausea And Vomiting and Other (See Comments) 04/13/2011  . Tape Rash 05/06/2016   Past Medical History:  Diagnosis Date  . Asthma   . Bleeding nose   . Cancer ([place]) 2017   Lung   . Diabetes mellitus   . Diverticulitis   . H. pylori infection   . Hypertension   . Lung nodule 2017   Both lungs-pt at [facility].   . Parkinson disease ([place]) 2010  . Tubular adenoma of colon 05/2007   Family History:  Brother-SCLC-deceased at age 33, smoker Mother-Lung CA-deceased at age 3, smoker Grandmother-deceased of lung cancer, non-smoker age unspecified  Social History:  Denied EtOH or illicit drugs. Attested to smoking cessation of one year-former ppd for 50 year smoking history   Review of Systems: A complete ROS was negative except as per HPI.   Physical Exam: Blood pressure 113/69, pulse 72, temperature 100 F (37.8 C), temperature source Oral, resp. rate 12, height 5\' 4"  (1.626 m), weight 225 lb (102.1 kg), SpO2 94 %. General: In no acute distress, resting comfortably in her bed, nondiaphoretic Neuro: Alert and oriented, no acute focal deficit noted on brief CN and strength evaluation. NO coordination abnormality HEENT: EOM intact, absent pharyngitis, no cervical lymphadenopathy  Cardiovascular: RRR, S1-2 clearly auscultated, no murmur appreciated  Pulmonary: Left lung field with prominent wheezing upper and lower, Right w/ decreased air flow absent wheezing GI: No abdominal tenderness, distention or hernia. Bowel sounds normal Musculoskeletal: No peripheral edema. No tenderness to palpation of the extremities. Psych: mood appropriate for situation. Minimal anxiety noted.   EKG: personally reviewed my interpretation  is sinus rhythm, T wave inversion in lead III, aVR, elevated ST segment in lead II. Abnormal EKG but consistent with the 02/28/2017 EKG.   CXR: personally reviewed my interpretation is no acute process with not significant change from her previous DG chest on 03/21/2017. No focal opacities consistent with pneumonia, no edema or consolidation noted.  Assessment & Plan by Problem: Active Problems:   Type 2 diabetes mellitus with peripheral neuropathy (HCC)   Generalized anxiety disorder   Asthma   GERD   COPD exacerbation (HCC)   Non-small cell carcinoma of lung, stage 4 (HCC)  Assessment: 71 yo F who presented with a five day history of dyspnea. She attested to increased sputum production initially which decreased as indicated above. Given her given her emphysematous changes on CT, Hx of asthma, mucus production with cough, and smoking history a presumptive diagnosis of COPD with exacerbation features has been made. However, other concerning diagnosis such as congestive heart failure, pulmonary embolism, pneumonia, myocardial ischemia, and worsening of her underlying carcinoma have either been ruled less likely or remain under minimal  consideration. We will continue to evaluate her with close monitoring for potential progression on current therapy.   COPD exacerbation:  No documented history of COPD with PFT's. However, given her emphysematous changes on CT, Hx of asthma, mucus production with cough, smoking history and lack of a better explanation for her symptoms she is most likely experiencing a COPD exacerbation. NO evidence of pneumonia on CXR or CT and without a leukocytosis. -Continue home equivalent Dulera inharler -Azithromycin x 5 days -Duonebs Q 6 hours PRN -Albuterol nebs Q4 hours PRN for wheezing -Given solumedrol 125mg  in ED -Continue corticosteroids with prednisone 40mg  daily -CBC in am.  Stage IV NSCLC: No acute inpatient needs with regard to treatment of the primary  carcinoma at this time. Last treatment on 06/13/2017. Significant secondary pain. -Continue Oxycodone 20-40mg  PO Q4Hrs PRN for severe pain -Discontinue fentanyl patch this admission for better adjust for acute pain needs  Anxiety: Minimally anxious but denying acute issue.  -Continue home Alprazolam 1 mg BID PRN for anxiety  -Continue sertraline 100mg  daily  Parkinsons disease: Patient reports increased tremor for the past month or possibly longer. Will need to possibly recommend outpatient follow-up for the tremor. -Continue Sinemet 1 tablet 25-250 daily  DMII w/ peripheral neuropathy: On Lantus 65 units at home with SSI novolog for meal time coverage ranging from 8-14 units.  -Lantus 50 units QHS -SSI Moderate with HS scale -Novolog meal time coverage 6 units -CBG TID w/ meals  -Continue gabapentin for peripheral neuropathy   Hypervolemia:  No significant signs of hypervolemia noted on exam or via studies thus far. Typically occurs secondary to her immunotherapy infusion. Patient prescribed lasix 40mg  daily PRN for edema. Last Echo in November following pericardial effusion. No current immediate indication to repeat testing.  -Continue Lasix 40mg  PRN for edema -BNP pending, will consider echo following results of evaluation and current treatment  Diet: Diet Carb modified Code: DNR Fluids: n/a GI ppx: Protonix 40mg  daily VTE ppx: heparin 5000 units q8 hours Dispo: Admit patient to Inpatient with expected length of stay greater than 2 midnights.  Signed: Kathi Ludwig, MD 06/19/2017, 9:37 PM  Pager: Pager# [phone]

## 2017-06-19 NOTE — ED Notes (Signed)
Patient transported to CT 

## 2017-06-19 NOTE — ED Notes (Signed)
Dinner tray at bedside, patient ambulated to the bathroom at this time.

## 2017-06-19 NOTE — ED Provider Notes (Signed)
[facility] EMERGENCY DEPARTMENT Provider Note   CSN: [id number] Arrival date & time: 06/19/17  1205     History   Chief Complaint Chief Complaint  Patient presents with  . Shortness of Breath    HPI [name] is a 72 y.o. female.  HPI  [name] is a 72 year old female with a history of stage IV non-small cell lung cancer (currently receives bimonthly immunotherapy and seen by Dr. Georgeanna Lea at [facility]), insulin-dependent diabetes, hypertension, hyperlipidemia, Parkinson's disease, asthma who presents to the emergency department for evaluation of shortness of breath.  Patient states that she developed a productive cough about 4 weeks ago, noting thick gray/green mucus.  About a week ago the cough became dry and she states that she started feeling short of breath.  Reports starting 2 L home oxygen whereas she had previously been off of oxygen for about a year since beginning immunotherapy treatments.  She checks her pulse ox at home which was 88-91% on 2 L O2.  She also has had wheezing.  Has been taking her prescribed Advair daily and albuterol as needed.  Her shortness of breath has not improved despite taking her albuterol more frequently. Denies fever/chills, congestion, headaches, body aches, leg swelling, palpitations, chest pain, abdominal pain, N/V, diarrhea, dysuria, urinary frequency. Denies history of DVT/PE, pleuritic chest pain, leg swelling or calf tenderness, recent surgery or immobilization, exogenous estrogen.   Past Medical History:  Diagnosis Date  . Asthma   . Bleeding nose   . Cancer ([place]) 2017   Lung   . Diabetes mellitus   . Diverticulitis   . H. pylori infection   . Hypertension   . Lung nodule 2017   Both lungs-[facility] at [facility].   . Parkinson disease ([place]) 2010  . Tubular adenoma of colon 05/2007    Patient Active Problem List   Diagnosis Date Noted  . Tamponade 02/28/2017  . Pericardial effusion 02/28/2017  . Chest pain 02/26/2016  .  Colitis 03/02/2015  . Diabetes mellitus type 2, controlled ([place]) 03/02/2015  . Hyperlipidemia 03/02/2015  . OSA (obstructive sleep apnea) 05/10/2012  . Essential tremor 03/19/2012  . DIVERTICULITIS OF COLON 02/03/2008  . ANEMIA, IRON DEFICIENCY 12/27/2007  . HEMORRHOIDS, INTERNAL 12/27/2007  . GERD 12/27/2007  . GASTRITIS 12/27/2007  . DIVERTICULOSIS, COLON 12/27/2007  . PUD, HX OF 12/27/2007  . COLONIC POLYPS, HX OF 12/27/2007  . ANXIETY 04/23/2007  . Essential hypertension 04/23/2007  . ARTHRITIS 04/23/2007  . SLEEP APNEA 04/23/2007  . DM 04/25/1991  . Asthma 04/23/1987    Past Surgical History:  Procedure Laterality Date  . APPENDECTOMY    . CHOLECYSTECTOMY    . COLON SURGERY     colectomy and reversal after diverticular ds  . PARTIAL COLECTOMY    . PERICARDIAL WINDOW N/A 02/28/2017   Procedure: PERICARDIAL WINDOW;  Surgeon: [name], MD;  Location: [facility] OR;  Service: Thoracic;  Laterality: N/A;  . TONSILLECTOMY    . TOTAL KNEE ARTHROPLASTY     right  . VAGINAL HYSTERECTOMY      OB History    No data available       Home Medications    Prior to Admission medications   Medication Sig Start Date End Date Taking? Authorizing Provider  ALPRAZolam Duanne Moron) 1 MG tablet Take 1 tablet (1 mg total) 2 (two) times daily as needed by mouth for anxiety. 03/05/17   Gold, Wayne E, PA-C  carbidopa-levodopa (SINEMET IR) 25-250 MG tablet Take 1  tablet by mouth at bedtime.    [provider]  fentaNYL (DURAGESIC - DOSED MCG/HR) 100 MCG/HR Place 100 mcg every 3 (three) days onto the skin. 02/21/17   [provider]  Fluticasone-Salmeterol (ADVAIR) 500-50 MCG/DOSE AEPB Inhale 1 puff into the lungs every 12 (twelve) hours.      [provider]  furosemide (LASIX) 40 MG tablet Take 1 tablet (40 mg total) daily by mouth. 03/06/17   Gold, Wayne E, PA-C  gabapentin (NEURONTIN) 300 MG capsule Take 900 mg at bedtime by mouth. 02/21/17   [provider]   hydrocortisone 2.5 % cream Apply 1 application as needed topically. 01/31/17   [provider]  insulin glargine (LANTUS) 100 UNIT/ML injection Inject 80 Units at bedtime into the skin.     [provider]  omeprazole (PRILOSEC) 40 MG capsule Take 1 capsule (40 mg total) by mouth 2 (two) times daily. 04/20/16   Ladene Artist, MD  Oxycodone HCl 20 MG TABS Take 20-40 mg every 4 (four) hours as needed by mouth for pain. 02/21/17   [provider]  potassium chloride SA (K-DUR,KLOR-CON) 20 MEQ tablet Take 1 tablet (20 mEq total) daily by mouth. 03/06/17   Gold, Wayne E, PA-C  PROAIR HFA 108 (90 BASE) MCG/ACT inhaler Inhale 2 puffs into the lungs every 4 (four) hours as needed for wheezing or shortness of breath.  06/24/13   [provider]  promethazine (PHENERGAN) 12.5 MG tablet Take 12.5 mg as needed by mouth. 12/13/16   [provider]  sertraline (ZOLOFT) 100 MG tablet Take 100 mg by mouth daily. 06/29/13   [provider]  tiZANidine (ZANAFLEX) 4 MG tablet Take 4 mg by mouth every 6 (six) hours as needed for muscle spasms.    [provider]    Family History Family History  Problem Relation Age of Onset  . Diabetes Mellitus II Sister   . Diabetes Mellitus II Son   . Colon cancer Neg Hx     Social History Social History   Tobacco Use  . Smoking status: Former Smoker    Last attempt to quit: 03/20/1987    Years since quitting: 30.2  . Smokeless tobacco: Never Used  Substance Use Topics  . Alcohol use: No  . Drug use: No     Allergies   Morphine and related and Tape   Review of Systems Review of Systems  Constitutional: Negative for chills and fever.  HENT: Negative for congestion, postnasal drip and sore throat.   Eyes: Negative for visual disturbance.  Respiratory: Positive for cough, shortness of breath and wheezing.   Cardiovascular: Negative for chest pain, palpitations and leg swelling.  Gastrointestinal:  Negative for abdominal pain, nausea and vomiting.  Genitourinary: Negative for difficulty urinating and dysuria.  Musculoskeletal: Negative for back pain, gait problem and myalgias.  Skin: Negative for rash.  Neurological: Negative for weakness, numbness and headaches.  Psychiatric/Behavioral: Negative for agitation.     Physical Exam Updated Vital Signs BP 134/62 (BP Location: Right Arm)   Pulse 79   Temp 100 F (37.8 C) (Oral)   Resp 17   Ht 5\' 4"  (1.626 m)   Wt 102.1 kg (225 lb)   SpO2 100%   BMI 38.62 kg/m   Physical Exam  Constitutional: She is oriented to person, place, and time. She appears well-developed and well-nourished. No distress.  HENT:  Head: Normocephalic and atraumatic.  Mouth/Throat: Oropharynx is clear and moist. No oropharyngeal exudate.  Eyes: Conjunctivae are normal. Pupils are equal, round, and reactive to light. Right eye exhibits no discharge. Left eye exhibits no discharge.  Neck: Normal range of motion. Neck supple. No JVD present. No tracheal deviation present.  Cardiovascular: Normal rate, regular rhythm and intact distal pulses. Exam reveals no friction rub.  No murmur heard. Pulmonary/Chest: Effort normal. No respiratory distress.  No respiratory distress.  Patient speaking in full sentences.  On 3 L O2 with pulse ox 96%.  Bilateral expiratory rhonchi in bilateral lower lung fields.  No wheezing, stridor or rales.  Abdominal: Soft. Bowel sounds are normal. There is no tenderness. There is no guarding.  Musculoskeletal:  No leg swelling or calf tenderness.  Neurological: She is alert and oriented to person, place, and time. Coordination normal.  Skin: Skin is warm and dry. Capillary refill takes less than 2 seconds. She is not diaphoretic.  Psychiatric: She has a normal mood and affect. Her behavior is normal.  Nursing note and vitals reviewed.    ED Treatments / Results  Labs (all labs ordered are listed, but only abnormal results are  displayed) Labs Reviewed  COMPREHENSIVE METABOLIC PANEL - Abnormal; Notable for the following components:      Result Value   Glucose, Bld 297 (*)    Calcium 8.8 (*)    Albumin 3.0 (*)    AST 14 (*)    ALT 10 (*)    All other components within normal limits  CBC    EKG  EKG Interpretation  Date/Time:  Tuesday June 19 2017 12:30:43 EST Ventricular Rate:  92 PR Interval:  160 QRS Duration: 126 QT Interval:  382 QTC Calculation: 472 R Axis:   44 Text Interpretation:  Normal sinus rhythm Right bundle branch block Abnormal ECG No significant change since last tracing Confirmed by Deno Etienne [phone]) on 06/19/2017 3:40:44 PM       Radiology Dg Chest 2 View  Result Date: 06/19/2017 CLINICAL DATA:  Shortness of breath and cough EXAM: CHEST  2 VIEW COMPARISON:  March 21, 2017 FINDINGS: There is no edema or consolidation. The heart size and pulmonary vascularity are normal. No adenopathy. There is degenerative change in the thoracic spine. IMPRESSION: No edema or consolidation. Electronically Signed   By: Lowella Grip III M.D.   On: 06/19/2017 13:23    Procedures Procedures (including critical care time)  Medications Ordered in ED   Initial Impression / Assessment and Plan / ED Course  I have reviewed the triage vital signs and the nursing notes.  Pertinent labs & imaging results that were available during my care of the patient were reviewed by me and considered in my medical decision making (see chart for details).    With a history of stage IV non-small cell lung cancer and asthma presents to the emergency department for evaluation of cough, wheezing and shortness of breath. She has a new oxygen requirement of 3 L nasal cannula. Reports little improvement in breathing status with home albuterol inhaler. Denies chest pain.    On exam patient is afebrile and non-toxic appearing. No respiratory distress.  She has bilateral expiratory rhonchi.  No leg swelling. She does  not appear to be fluid overloaded.    Concern for PE given patient has cancer and new oxygen requirement. Plan to get CT angio of chest to further evaluate. Duoneb treatment ordered given patient's lung exam and history of asthma.   Results reviewed. CBC unremarkable. CMP reveals elevated glucose (bg 297), patient is a  known diabetic.  Troponin negative. EKG with RBBB, stable from previous. CT angio negative for pulmonary embolus.  It does show cardiomegaly with mild interstitial pulmonary edema concern for CHF. CT also shows bronchial wall thickening and paraseptal emphysema suggestive of underlying COPD.  On recheck, patient states she feels improved following breathing treatment.  Presentation more suggestive of COPD exacerbation and CHF given no signs of fluid overload on exam.  Solu-Medrol 125 given in the emergency department.  Discussed this patient with Dr. Tyrone Nine who also saw the patient and agrees with hospital admission for shortness of breath with new oxygen requirement.  Discussed this patient with internal medicine Dr. Juleen China who will admit.  Informed patient and family at bedside.  Final Clinical Impressions(s) / ED Diagnoses   Final diagnoses:  Shortness of breath    ED Discharge Orders    None       Glyn Ade, PA-C 06/20/17 [place], [place], DO 06/20/17 1503

## 2017-06-19 NOTE — ED Notes (Signed)
Admitting team at bedside.

## 2017-06-19 NOTE — ED Triage Notes (Signed)
Pt c/o of of SOB and non productive cough x 4 weeks.  Pt has Hx of Stage 4 lung cancer and is being treated at [facility].  Pt is on 2L of O2 at home and has noticed her SPO2 was dropping into the mid 80's. Pt placed on 3L [place] O2. Pt called her Oncologist at [facility] and was told to come into the ED today for "CT of her lungs".

## 2017-06-19 NOTE — ED Notes (Signed)
Pt back from CT, breathing tx started

## 2017-06-20 DIAGNOSIS — I10 Essential (primary) hypertension: Secondary | ICD-10-CM | POA: Diagnosis not present

## 2017-06-20 DIAGNOSIS — F17200 Nicotine dependence, unspecified, uncomplicated: Secondary | ICD-10-CM | POA: Diagnosis not present

## 2017-06-20 DIAGNOSIS — Z801 Family history of malignant neoplasm of trachea, bronchus and lung: Secondary | ICD-10-CM

## 2017-06-20 DIAGNOSIS — F411 Generalized anxiety disorder: Secondary | ICD-10-CM

## 2017-06-20 DIAGNOSIS — Z79891 Long term (current) use of opiate analgesic: Secondary | ICD-10-CM | POA: Diagnosis not present

## 2017-06-20 DIAGNOSIS — J441 Chronic obstructive pulmonary disease with (acute) exacerbation: Secondary | ICD-10-CM | POA: Diagnosis not present

## 2017-06-20 DIAGNOSIS — Z7951 Long term (current) use of inhaled steroids: Secondary | ICD-10-CM

## 2017-06-20 DIAGNOSIS — E1142 Type 2 diabetes mellitus with diabetic polyneuropathy: Secondary | ICD-10-CM | POA: Diagnosis not present

## 2017-06-20 DIAGNOSIS — Z66 Do not resuscitate: Secondary | ICD-10-CM

## 2017-06-20 DIAGNOSIS — E785 Hyperlipidemia, unspecified: Secondary | ICD-10-CM | POA: Diagnosis not present

## 2017-06-20 DIAGNOSIS — R011 Cardiac murmur, unspecified: Secondary | ICD-10-CM | POA: Diagnosis not present

## 2017-06-20 DIAGNOSIS — Z9981 Dependence on supplemental oxygen: Secondary | ICD-10-CM

## 2017-06-20 DIAGNOSIS — C3490 Malignant neoplasm of unspecified part of unspecified bronchus or lung: Secondary | ICD-10-CM | POA: Diagnosis not present

## 2017-06-20 DIAGNOSIS — Z885 Allergy status to narcotic agent status: Secondary | ICD-10-CM

## 2017-06-20 DIAGNOSIS — Z794 Long term (current) use of insulin: Secondary | ICD-10-CM

## 2017-06-20 DIAGNOSIS — G20 Parkinson's disease: Secondary | ICD-10-CM | POA: Diagnosis not present

## 2017-06-20 DIAGNOSIS — Z79899 Other long term (current) drug therapy: Secondary | ICD-10-CM

## 2017-06-20 DIAGNOSIS — Z91048 Other nonmedicinal substance allergy status: Secondary | ICD-10-CM

## 2017-06-20 LAB — CBG MONITORING, ED
GLUCOSE-CAPILLARY: 270 mg/dL — AB (ref 65–99)
GLUCOSE-CAPILLARY: 469 mg/dL — AB (ref 65–99)
GLUCOSE-CAPILLARY: 596 mg/dL — AB (ref 65–99)
Glucose-Capillary: 557 mg/dL (ref 65–99)
Glucose-Capillary: 396 mg/dL — ABNORMAL HIGH (ref 65–99)
Glucose-Capillary: 301 mg/dL — ABNORMAL HIGH (ref 65–99)

## 2017-06-20 LAB — BASIC METABOLIC PANEL
ANION GAP: 13 (ref 5–15)
BUN: 13 mg/dL (ref 6–20)
CO2: 23 mmol/L (ref 22–32)
Calcium: 8.8 mg/dL — ABNORMAL LOW (ref 8.9–10.3)
Chloride: 98 mmol/L — ABNORMAL LOW (ref 101–111)
Creatinine, Ser: 0.9 mg/dL (ref 0.44–1.00)
GFR calc Af Amer: >60 mL/min (ref >60)
GFR calc non Af Amer: >60 mL/min (ref >60)
GLUCOSE: 524 mg/dL — AB (ref 65–99)
POTASSIUM: 4.6 mmol/L (ref 3.5–5.1)
SODIUM: 134 mmol/L — AB (ref 135–145)

## 2017-06-20 LAB — CBC
HCT: 35.6 % — ABNORMAL LOW (ref 36.0–46.0)
HEMOGLOBIN: 11.4 g/dL — AB (ref 12.0–15.0)
MCH: 26.9 pg (ref 26.0–34.0)
MCHC: 32 g/dL (ref 30.0–36.0)
MCV: 84 fL (ref 78.0–100.0)
Platelets: 206 10*3/uL (ref 150–400)
RBC: 4.24 MIL/uL (ref 3.87–5.11)
RDW: 14.7 % (ref 11.5–15.5)
WBC: 5.5 10*3/uL (ref 4.0–10.5)

## 2017-06-20 LAB — MRSA PCR SCREENING: MRSA BY PCR: NEGATIVE

## 2017-06-20 LAB — GLUCOSE, CAPILLARY: GLUCOSE-CAPILLARY: 263 mg/dL — AB (ref 65–99)

## 2017-06-20 MED ORDER — IPRATROPIUM-ALBUTEROL 0.5-2.5 (3) MG/3ML IN SOLN: 3.0000 mL | Freq: Four times a day (QID) | RESPIRATORY_TRACT | 2 refills | Status: DC | PRN

## 2017-06-20 MED ORDER — INSULIN ASPART 100 UNIT/ML ~~LOC~~ SOLN
10.0000 [IU] | Freq: Three times a day (TID) | SUBCUTANEOUS | Status: DC
  Administered 2017-06-20 (×2): 10 [IU] via SUBCUTANEOUS
  Filled 2017-06-20: qty 1

## 2017-06-20 MED ORDER — PREDNISONE 20 MG PO TABS: 40.0000 mg | ORAL_TABLET | Freq: Every day | ORAL | 0 refills | Status: DC

## 2017-06-20 MED ORDER — AZITHROMYCIN 250 MG PO TABS: ORAL_TABLET | ORAL | 0 refills | Status: DC

## 2017-06-20 MED ORDER — INSULIN ASPART 100 UNIT/ML ~~LOC~~ SOLN
20.0000 [IU] | Freq: Once | SUBCUTANEOUS | Status: AC
  Administered 2017-06-20: 20 [IU] via SUBCUTANEOUS
  Filled 2017-06-20: qty 1

## 2017-06-20 MED ORDER — HUMALOG KWIKPEN 100 UNIT/ML ~~LOC~~ SOPN: 12.0000 [IU] | PEN_INJECTOR | Freq: Three times a day (TID) | SUBCUTANEOUS | 11 refills | Status: AC

## 2017-06-20 NOTE — ED Notes (Signed)
Attempted

## 2017-06-20 NOTE — Progress Notes (Signed)
   Subjective: Patient reports that she feels somewhat better this morning.  She did associate this exacerbation with her last immunotherapy treatment reporting feeling much worse the following day.    Objective:  Vital signs in last 24 hours: Vitals:   06/20/17 0900 06/20/17 0915 06/20/17 1000 06/20/17 1015  BP: (!) 101/56     Pulse: 71 68 78 69  Resp:      Temp:      TempSrc:      SpO2:   93% 94%  Weight:      Height:       Physical Exam  Constitutional: She is oriented to person, place, and time. No distress.  Cardiovascular: Normal rate and regular rhythm. Exam reveals no gallop and no friction rub.  Murmur (2/6 systolic LLSB) heard. Pulmonary/Chest: Effort normal. No respiratory distress. She has decreased breath sounds in the right middle field and the right lower field. She has wheezes in the left middle field and the left lower field. She has no rales. She exhibits no tenderness.  Abdominal: Soft. Bowel sounds are normal. She exhibits no distension and no mass. There is no tenderness. There is no rebound and no guarding.  Neurological: She is alert and oriented to person, place, and time.  Skin: She is not diaphoretic.    Assessment/Plan:  Active Problems:   Type 2 diabetes mellitus with peripheral neuropathy (HCC)   Generalized anxiety disorder   Asthma   GERD   COPD exacerbation (HCC)   Non-small cell carcinoma of lung, stage 4 (HCC)   Parkinson's disease ([place])  Acute on chronic COPD No PFT's available, self reports diagnosis of asthma at age 62.  CT scan shows diffuse centrilobular and paraseptal emphysema  -Continue Dulera -Duonebs Q6 -Albuterol nebs Q4 hours PRN  -continue prednisone 40mg  for 4 more days    Stage IV NSCLC Nothing to do acutely, in talking with the patient she felt the exacerbation about a day after having her last immunotherapy treatment.   -advised pt to let her oncologist know about COPD exacerbation after last immunotherapy  treatment -Nivolumab and ipilimumab both known to have associated respiratory symptoms as adverse reactions, dyspnea, cough,     T2DM Pt follows with [facility], CBG's acutely worsened by steroids for COPD exacerbation   -Lantus 65 units QHS -10 units novolog at meals with SSI resistant with HS scale -Continue gabapentin for peripheral neuropathy  -instructed son and mother on adding mealtime insulin during her steroid therapy and to switch back to her usual regimen 24 hours after last steroid dose.     Parkinson's disease  -continue carbidopa levodopa 25-250mg  daily at bedtime   Dispo: Anticipated discharge later this afternoon   Katherine Roan, MD 06/20/2017, 10:57 AM Vickki Muff MD PGY-1 Internal Medicine Pager # [phone]

## 2017-06-20 NOTE — ED Notes (Signed)
Spoke with admitting MD with internal Medicine  About insulin order advised to give 10 units plus sliding scale.

## 2017-06-20 NOTE — ED Notes (Signed)
Admitting MD Juleen China  paged to notify  of patient CBG of [phone].

## 2017-06-20 NOTE — ED Notes (Addendum)
Paged admitting due to hyperglycemia, physician gave verbal order, instructed to give 20 units novolog SQ, next RN giving novolog, will recheck sugar around 0445

## 2017-06-20 NOTE — ED Notes (Signed)
Spoke with  Admitting MD Juleen China advised of patient CBG [phone] no recommendation at this time.

## 2017-06-20 NOTE — Progress Notes (Signed)
Inpatient Diabetes Program Recommendations  AACE/ADA: New Consensus Statement on Inpatient Glycemic Control (2015)  Target Ranges:  Prepandial:   less than 140 mg/dL      Peak postprandial:   less than 180 mg/dL (1-2 hours)      Critically ill patients:  140 - 180 mg/dL   Review of Glycemic Control Results for [name], [name] (MRN [id number]) as of 06/20/2017 15:14  Ref. Range 06/20/2017 02:50 06/20/2017 04:05 06/20/2017 05:09 06/20/2017 06:59 06/20/2017 09:09  Glucose-Capillary Latest Ref Range: 65 - 99 mg/dL 557 (HH) 469 (H) 396 (H) 301 (H) 270 (H)   Diabetes history: DM2 Outpatient Diabetes medications: Lantus 65 units qd + Humalog 0-10 units tid meal coverage Current orders for Inpatient glycemic control: Lantus 65 units + Novolog 10 units tid meal coverage + Novolog 0-20 units correction tid  Inpatient Diabetes Program Recommendations:   -A1c to determine prehospital glycemic control  Thank you, Bethena Roys E. Lezly Rumpf, RN, MSN, CDE  Diabetes Coordinator Inpatient Glycemic Control Team Team Pager [phone] (8am-5pm) 06/20/2017 3:17 PM

## 2017-06-20 NOTE — Progress Notes (Signed)
Patient ambulated in hallway 200 feet. O@ sats 91-92 % on room air. Patient denies any respiratory distress a this time.

## 2017-06-20 NOTE — ED Notes (Signed)
Admitting at bedside 

## 2017-06-20 NOTE — Progress Notes (Signed)
CPAP set up and patient placed on 11.0 cmH20 ( per home settings) via nasal mask with 3 LPM O2 bleed in. RN aware.

## 2017-06-20 NOTE — Discharge Summary (Signed)
Name: [name] MRN: [id number] DOB: 1945-09-02 72 y.o. PCP: Octavio Graves, DO  Date of Admission: 06/19/2017  2:40 PM Date of Discharge: 06/20/17 Attending Physician: Oval Linsey, MD  Discharge Diagnosis: 1.  Active Problems:   Type 2 diabetes mellitus with peripheral neuropathy (HCC)   Generalized anxiety disorder   Asthma   GERD   COPD exacerbation (HCC)   Non-small cell carcinoma of lung, stage 4 (HCC)   Parkinson's disease ([place])   Discharge Medications: Allergies as of 06/20/2017      Reactions   Morphine And Related Nausea And Vomiting, Other (See Comments)   Headache (also)   Tape Rash   Can only tolerate for short amounts of time (CANNOT BE "LEFT" ON THE SAME SITE FOR LONG)      Medication List    TAKE these medications   ALPRAZolam 1 MG tablet Commonly known as:  XANAX Take 1 tablet (1 mg total) 2 (two) times daily as needed by mouth for anxiety.   azithromycin 250 MG tablet Commonly known as:  ZITHROMAX Take 1 tablet of  250 mg daily, starting from tomorrow for 3 more days. Start taking on:  06/21/2017   carbidopa-levodopa 25-250 MG tablet Commonly known as:  SINEMET IR Take 1 tablet by mouth at bedtime.   fentaNYL 100 MCG/HR Commonly known as:  DURAGESIC - dosed mcg/hr Place 100 mcg every 3 (three) days onto the skin.   Fluticasone-Salmeterol 500-50 MCG/DOSE Aepb Commonly known as:  ADVAIR Inhale 1 puff into the lungs every 12 (twelve) hours.   furosemide 40 MG tablet Commonly known as:  LASIX Take 1 tablet (40 mg total) daily by mouth. What changed:    when to take this  reasons to take this   gabapentin 300 MG capsule Commonly known as:  NEURONTIN Take 900 mg at bedtime by mouth.   HUMALOG KWIKPEN 100 UNIT/ML KiwkPen Generic drug:  insulin lispro Inject 0.12 mLs (12 Units total) into the skin 3 (three) times daily before meals. Along with your normal sliding scale. What changed:    how much to take  additional  instructions   hydrocortisone 2.5 % cream Apply 1 application topically as needed (itching).   insulin glargine 100 UNIT/ML injection Commonly known as:  LANTUS Inject 65 Units into the skin at bedtime.   ipratropium-albuterol 0.5-2.5 (3) MG/3ML Soln Commonly known as:  DUONEB Take 3 mLs by nebulization every 6 (six) hours as needed.   omeprazole 40 MG capsule Commonly known as:  PRILOSEC Take 1 capsule (40 mg total) by mouth 2 (two) times daily. What changed:    when to take this  reasons to take this   Oxycodone HCl 20 MG Tabs Take 20-40 mg every 4 (four) hours as needed by mouth for pain.   potassium chloride SA 20 MEQ tablet Commonly known as:  K-DUR,KLOR-CON Take 1 tablet (20 mEq total) daily by mouth.   predniSONE 20 MG tablet Commonly known as:  DELTASONE Take 2 tablets (40 mg total) by mouth daily with breakfast. For 3 more days, starting from tomorrow. Start taking on:  06/21/2017   PROAIR HFA 108 (90 Base) MCG/ACT inhaler Generic drug:  albuterol Inhale 2 puffs into the lungs every 4 (four) hours as needed for wheezing or shortness of breath.   sertraline 100 MG tablet Commonly known as:  ZOLOFT Take 100 mg by mouth daily.       Disposition and follow-up:   [name] was discharged from [facility] in stable condition.  At the hospital follow up visit please address:  Acute on chronic COPD Stage IV NSCLC  -let her oncologist know about COPD exacerbation after last immunotherapy treatment -ensure maintenance therapy for COPD adequate     2.  Labs / imaging needed at time of follow-up: none  3.  Pending labs/ test needing follow-up: none  Follow-up Appointments:   Pt has appt with oncologist on 06/27/17  Hospital Course by problem list: Active Problems:   Type 2 diabetes mellitus with peripheral neuropathy (HCC)   Generalized anxiety disorder   Asthma   GERD   COPD exacerbation (HCC)   Non-small cell carcinoma of  lung, stage 4 (HCC)   Parkinson's disease ([place])   Acute on chronic COPD Stage IV NSCLC  Patient arrived with hypoxemia, SOB and was found to be in an acute exacerbation of her COPD.  She had no recent illness, febrile symptoms,sick contacts, cough with sputum or any factor to suggest infection from a virus or bacterium as the precipitating factor.  Because of her underlying heart failure a careful workup including clinical assesment, imaging and labs disagreed with the possibility of fluid overload as a contributing factor.  She started having trouble with feeling short of breath shortly after her immunotherapy treatment for her lung cancer.  She was started on prednisone and azithromycin, scheduled duoneb therapy and had subsequent improvement in her exacerbation.  The following day her wheezing was greatly improved and she was able to ambulate without desaturating below 90%.  As she was feeling much better and had objective improvement as well she was discharged with prescriptions to finish her course of prednisone and azithromycin.  She was also given instructions, along with her son who helps with her insulin, on how to adjust her insulin while on steroid therapy to help keep it under control.    Discharge Vitals:   BP (!) 99/46   Pulse 62   Temp 98.5 F (36.9 C) (Oral)   Resp 20   Ht 5\' 4"  (1.626 m)   Wt 225 lb (102.1 kg)   SpO2 97%   BMI 38.62 kg/m   Pertinent Labs, Studies, and Procedures:  CBC Latest Ref Rng & Units 06/20/2017 06/19/2017 03/03/2017  WBC 4.0 - 10.5 K/uL 5.5 7.5 6.6  Hemoglobin 12.0 - 15.0 g/dL 11.4(L) 12.8 9.6(L)  Hematocrit 36.0 - 46.0 % 35.6(L) 39.5 30.6(L)  Platelets 150 - 400 K/uL 206 209 194   BMP Latest Ref Rng & Units 06/20/2017 06/19/2017 03/03/2017  Glucose 65 - 99 mg/dL 524(HH) 297(H) 147(H)  BUN 6 - 20 mg/dL 13 10 6   Creatinine 0.44 - 1.00 mg/dL 0.90 0.71 0.78  Sodium 135 - 145 mmol/L 134(L) 137 138  Potassium 3.5 - 5.1 mmol/L 4.6 4.1 3.9  Chloride 101  - 111 mmol/L 98(L) 101 105  CO2 22 - 32 mmol/L 23 24 30   Calcium 8.9 - 10.3 mg/dL 8.8(L) 8.8(L) 8.2(L)     CLINICAL DATA:  72 year old female with history of stage IV lung cancer. Currently being treated with immunotherapy at [facility]. Increasing shortness of breath since yesterday.   EXAM: CT ANGIOGRAPHY CHEST WITH CONTRAST   TECHNIQUE: Multidetector CT imaging of the chest was performed using the standard protocol during bolus administration of intravenous contrast. Multiplanar CT image reconstructions and MIPs were obtained to evaluate the vascular anatomy.   CONTRAST:  144mL ISOVUE-370 IOPAMIDOL (ISOVUE-370) INJECTION 76%   COMPARISON:  Chest CT 02/28/2017.   FINDINGS: Cardiovascular: There are  no filling defects within the pulmonary arterial tree to suggest underlying pulmonary embolism. Heart size is mildly enlarged. There is no significant pericardial fluid, thickening or pericardial calcification. There is aortic atherosclerosis, as well as atherosclerosis of the great vessels of the mediastinum and the coronary arteries, including calcified atherosclerotic plaque in the right coronary artery. Calcifications of the aortic valve.   Mediastinum/Nodes: No pathologically enlarged mediastinal or hilar lymph nodes. Prominent but nonenlarged distal paraesophageal lymph node measuring 11 mm in short axis is noted, nonspecific. Amorphous tissue in the hilar regions bilaterally likely reflects treated lymphadenopathy. Esophagus is unremarkable in appearance. No axillary lymphadenopathy.   Lungs/Pleura: There are some patchy areas of ground-glass attenuation noted in the lungs bilaterally which are nonspecific. Some associated interlobular septal thickening, most evident throughout the mid to lower lungs, suggestive that this may reflect a background of very mild interstitial pulmonary edema. No confluent consolidative airspace disease. No pleural effusions. No  definite suspicious appearing pulmonary nodules or masses. Mild diffuse bronchial wall thickening with mild centrilobular and paraseptal emphysema.   Upper Abdomen: Status post cholecystectomy.  Aortic atherosclerosis.   Musculoskeletal: Old healed mid sternal fracture. There are no aggressive appearing lytic or blastic lesions noted in the visualized portions of the skeleton.   Review of the MIP images confirms the above findings.   IMPRESSION: 1. No evidence of pulmonary embolism. 2. Cardiomegaly with evidence of mild interstitial pulmonary edema, which suggests underlying congestive heart failure. 3. Although there are some prominent but nonenlarged mediastinal lymph nodes and prominent soft tissue in the hilar regions bilaterally, there is no definite evidence of recurrent lung cancer at this time. 4. Aortic atherosclerosis, in addition to right coronary artery disease. Assessment for potential risk factor modification, dietary therapy or pharmacologic therapy may be warranted, if clinically indicated. 5. There are calcifications of the aortic valve. Echocardiographic correlation for evaluation of potential valvular dysfunction may be warranted if clinically indicated. 6. Mild diffuse bronchial wall thickening with mild centrilobular and paraseptal emphysema; imaging findings suggestive of underlying COPD.   Aortic Atherosclerosis (ICD10-I70.0) and Emphysema (ICD10-J43.9).     Electronically Signed   By: Vinnie Langton M.D.   On: 06/19/2017 18:10   Discharge Instructions: Discharge Instructions    Diet - low sodium heart healthy   Complete by:  As directed    Discharge instructions   Complete by:  As directed    It was pleasure taking care of you. Please take 12 units of Humalog plus your sliding scale before meals for next 3-4 days.  You will take prednisone 40 mg daily for next 3 days.  Please keep checking your blood sugar 4 times daily and can manage according  to your sliding scale. Once you finish your prednisone, you can resume your existing regimen after 24-hour. You will take azithromycin for another 3 days. We are giving you a prescription of DuoNeb nebulizer treatment, please take it 3 times a day for the next few days, then you can take it as needed.  You can resume your home inhalers according to your existing regimen. Her oxygen levels remain within normal limit while walking in the hospital, you do not need to use oxygen at home. Please follow-up with your primary care within 1 week.   Increase activity slowly   Complete by:  As directed       Signed: Katherine Roan, MD 06/20/2017, 4:16 PM

## 2017-06-20 NOTE — Progress Notes (Signed)
Patient being discharged home. Discharge instructions given. Patient educated on medications. Patient verbalize understanding. Patient discharge home with son.

## 2017-06-20 NOTE — ED Notes (Signed)
Paged Internal Medicine to [place], [place] @ [phone]

## 2017-06-20 NOTE — ED Notes (Signed)
Paged Dr about blood sugar and to order CPAP

## 2017-06-21 LAB — GLUCOSE, CAPILLARY: Glucose-Capillary: 117 mg/dL — ABNORMAL HIGH (ref 65–99)

## 2017-07-28 ENCOUNTER — Other Ambulatory Visit: Payer: Self-pay

## 2017-07-28 ENCOUNTER — Emergency Department (HOSPITAL_COMMUNITY): Payer: Medicare Other

## 2017-07-28 ENCOUNTER — Encounter (HOSPITAL_COMMUNITY): Payer: Self-pay | Admitting: Emergency Medicine

## 2017-07-28 ENCOUNTER — Emergency Department (HOSPITAL_COMMUNITY)
Admission: EM | Admit: 2017-07-28 | Discharge: 2017-07-28 | Disposition: A | Discharge status: Home / Self Care | Payer: Medicare Other | Attending: Emergency Medicine | Admitting: Emergency Medicine

## 2017-07-28 DIAGNOSIS — Z96651 Presence of right artificial knee joint: Secondary | ICD-10-CM | POA: Insufficient documentation

## 2017-07-28 DIAGNOSIS — Z794 Long term (current) use of insulin: Secondary | ICD-10-CM | POA: Diagnosis not present

## 2017-07-28 DIAGNOSIS — M62838 Other muscle spasm: Secondary | ICD-10-CM | POA: Insufficient documentation

## 2017-07-28 DIAGNOSIS — G20 Parkinson's disease: Secondary | ICD-10-CM | POA: Insufficient documentation

## 2017-07-28 DIAGNOSIS — Z87891 Personal history of nicotine dependence: Secondary | ICD-10-CM | POA: Diagnosis not present

## 2017-07-28 DIAGNOSIS — I10 Essential (primary) hypertension: Secondary | ICD-10-CM | POA: Diagnosis not present

## 2017-07-28 DIAGNOSIS — J449 Chronic obstructive pulmonary disease, unspecified: Secondary | ICD-10-CM | POA: Diagnosis not present

## 2017-07-28 DIAGNOSIS — R531 Weakness: Secondary | ICD-10-CM

## 2017-07-28 DIAGNOSIS — M6281 Muscle weakness (generalized): Secondary | ICD-10-CM | POA: Insufficient documentation

## 2017-07-28 DIAGNOSIS — C3492 Malignant neoplasm of unspecified part of left bronchus or lung: Secondary | ICD-10-CM | POA: Diagnosis not present

## 2017-07-28 DIAGNOSIS — E119 Type 2 diabetes mellitus without complications: Secondary | ICD-10-CM | POA: Insufficient documentation

## 2017-07-28 DIAGNOSIS — C3491 Malignant neoplasm of unspecified part of right bronchus or lung: Secondary | ICD-10-CM | POA: Diagnosis not present

## 2017-07-28 LAB — URINALYSIS, ROUTINE W REFLEX MICROSCOPIC
Bilirubin Urine: NEGATIVE
GLUCOSE, UA: NEGATIVE mg/dL
Hgb urine dipstick: NEGATIVE
Ketones, ur: 5 mg/dL — AB
NITRITE: NEGATIVE
PH: 6 (ref 5.0–8.0)
Protein, ur: NEGATIVE mg/dL
Specific Gravity, Urine: 1.01 (ref 1.005–1.030)

## 2017-07-28 LAB — COMPREHENSIVE METABOLIC PANEL
ALBUMIN: 3 g/dL — AB (ref 3.5–5.0)
ALT: 5 U/L — ABNORMAL LOW (ref 14–54)
ANION GAP: 11 (ref 5–15)
AST: 13 U/L — ABNORMAL LOW (ref 15–41)
Alkaline Phosphatase: 85 U/L (ref 38–126)
BILIRUBIN TOTAL: 0.3 mg/dL (ref 0.3–1.2)
BUN: 5 mg/dL — ABNORMAL LOW (ref 6–20)
CHLORIDE: 105 mmol/L (ref 101–111)
CO2: 25 mmol/L (ref 22–32)
Calcium: 8.8 mg/dL — ABNORMAL LOW (ref 8.9–10.3)
Creatinine, Ser: 0.66 mg/dL (ref 0.44–1.00)
GFR calc Af Amer: >60 mL/min (ref >60)
Glucose, Bld: 198 mg/dL — ABNORMAL HIGH (ref 65–99)
POTASSIUM: 3.7 mmol/L (ref 3.5–5.1)
Sodium: 141 mmol/L (ref 135–145)
TOTAL PROTEIN: 6.2 g/dL — AB (ref 6.5–8.1)

## 2017-07-28 LAB — CBC
HEMATOCRIT: 37.5 % (ref 36.0–46.0)
Hemoglobin: 12.2 g/dL (ref 12.0–15.0)
MCH: 27.1 pg (ref 26.0–34.0)
MCHC: 32.5 g/dL (ref 30.0–36.0)
MCV: 83.3 fL (ref 78.0–100.0)
Platelets: 192 10*3/uL (ref 150–400)
RBC: 4.5 MIL/uL (ref 3.87–5.11)
RDW: 14.4 % (ref 11.5–15.5)
WBC: 5.6 10*3/uL (ref 4.0–10.5)

## 2017-07-28 LAB — LIPASE, BLOOD: LIPASE: 22 U/L (ref 11–51)

## 2017-07-28 MED ORDER — SODIUM CHLORIDE 0.9 % IV BOLUS
500.0000 mL | Freq: Once | INTRAVENOUS | Status: AC
  Administered 2017-07-28: 500 mL via INTRAVENOUS

## 2017-07-28 NOTE — ED Provider Notes (Signed)
[facility] EMERGENCY DEPARTMENT Provider Note   CSN: [id number] Arrival date & time: 07/28/17  1052     History   Chief Complaint Chief Complaint  Patient presents with  . Abdominal Pain  . Spasms    HPI [name] is a 72 y.o. female.  HPI  [name] is a 72 y.o. female with history of Parkinson's disease, diabetes, hypertension, non-small cell carcinoma of the lung stage IV, presents to emergency department complaining of generalized weakness and cramping in her extremities.  Is currently on a trial chemotherapy, last injection was 2 days ago at [facility].  She states that she also has had low cortisol levels and was told she may have adrenal insufficiency and because she has been feeling so weak started on hydrocortisone.  States that this morning she woke up feeling shaky, checked her blood sugar and it was 56.  After drinking some orange juice she felt slightly better.  She states shortly after that she had an episode where her arms, legs, entire body spasmed.  Family is worried that this may be a side effect of the chemotherapy injection or hydrocortisone that she was recently started on.  Patient has been eating and drinking well.  She has not had any fever.  She has not had any urinary symptoms.  No cough or congestion.  She is currently feeling better.   Past Medical History:  Diagnosis Date  . Asthma   . Bleeding nose   . Cancer ([place]) 2017   Lung   . Diabetes mellitus   . Diverticulitis   . H. pylori infection   . Hypertension   . Lung nodule 2017   Both lungs-[facility] at [facility].   . Parkinson disease ([place]) 2010  . Tubular adenoma of colon 05/2007    Patient Active Problem List   Diagnosis Date Noted  . COPD exacerbation ([place]) 06/19/2017  . Non-small cell carcinoma of lung, stage 4 ([place]) 06/19/2017  . Parkinson's disease ([place]) 06/19/2017  . Tamponade 02/28/2017  . Pericardial effusion 02/28/2017  . Chest pain 02/26/2016  .  Colitis 03/02/2015  . Diabetes mellitus type 2, controlled ([place]) 03/02/2015  . Hyperlipidemia 03/02/2015  . OSA (obstructive sleep apnea) 05/10/2012  . Essential tremor 03/19/2012  . DIVERTICULITIS OF COLON 02/03/2008  . ANEMIA, IRON DEFICIENCY 12/27/2007  . HEMORRHOIDS, INTERNAL 12/27/2007  . GERD 12/27/2007  . GASTRITIS 12/27/2007  . DIVERTICULOSIS, COLON 12/27/2007  . PUD, HX OF 12/27/2007  . COLONIC POLYPS, HX OF 12/27/2007  . Generalized anxiety disorder 04/23/2007  . Essential hypertension 04/23/2007  . ARTHRITIS 04/23/2007  . SLEEP APNEA 04/23/2007  . Type 2 diabetes mellitus with peripheral neuropathy ([place]) 04/25/1991  . Asthma 04/23/1987    Past Surgical History:  Procedure Laterality Date  . APPENDECTOMY    . CHOLECYSTECTOMY    . COLON SURGERY     colectomy and reversal after diverticular ds  . PARTIAL COLECTOMY    . PERICARDIAL WINDOW N/A 02/28/2017   Procedure: PERICARDIAL WINDOW;  Surgeon: [name], MD;  Location: [facility] OR;  Service: Thoracic;  Laterality: N/A;  . TONSILLECTOMY    . TOTAL KNEE ARTHROPLASTY     right  . VAGINAL HYSTERECTOMY       OB History   None      Home Medications    Prior to Admission medications   Medication Sig Start Date End Date Taking? Authorizing Provider  ALPRAZolam Duanne Moron) 1 MG tablet Take 1 tablet (1 mg  total) 2 (two) times daily as needed by mouth for anxiety. 03/05/17   Gold, Wayne E, PA-C  azithromycin (ZITHROMAX) 250 MG tablet Take 1 tablet of  250 mg daily, starting from tomorrow for 3 more days. 06/21/17   Lorella Nimrod, MD  carbidopa-levodopa (SINEMET IR) 25-250 MG tablet Take 1 tablet by mouth at bedtime.    [provider]  fentaNYL (DURAGESIC - DOSED MCG/HR) 100 MCG/HR Place 100 mcg every 3 (three) days onto the skin. 02/21/17   [provider]  Fluticasone-Salmeterol (ADVAIR) 500-50 MCG/DOSE AEPB Inhale 1 puff into the lungs every 12 (twelve) hours.      [provider]  furosemide  (LASIX) 40 MG tablet Take 1 tablet (40 mg total) daily by mouth. Patient taking differently: Take 40 mg by mouth daily as needed for fluid.  03/06/17   Gold, Wayne E, PA-C  gabapentin (NEURONTIN) 300 MG capsule Take 900 mg at bedtime by mouth. 02/21/17   [provider]  HUMALOG KWIKPEN 100 UNIT/ML KiwkPen Inject 0.12 mLs (12 Units total) into the skin 3 (three) times daily before meals. Along with your normal sliding scale. 06/20/17   Lorella Nimrod, MD  hydrocortisone 2.5 % cream Apply 1 application topically as needed (itching).  01/31/17   [provider]  insulin glargine (LANTUS) 100 UNIT/ML injection Inject 65 Units into the skin at bedtime.     [provider]  ipratropium-albuterol (DUONEB) 0.5-2.5 (3) MG/3ML SOLN Take 3 mLs by nebulization every 6 (six) hours as needed. 06/20/17   Lorella Nimrod, MD  omeprazole (PRILOSEC) 40 MG capsule Take 1 capsule (40 mg total) by mouth 2 (two) times daily. Patient taking differently: Take 40 mg by mouth daily as needed (heartburn).  04/20/16   Ladene Artist, MD  Oxycodone HCl 20 MG TABS Take 20-40 mg every 4 (four) hours as needed by mouth for pain. 02/21/17   [provider]  potassium chloride SA (K-DUR,KLOR-CON) 20 MEQ tablet Take 1 tablet (20 mEq total) daily by mouth. Patient not taking: Reported on 06/19/2017 03/06/17   Jadene Pierini E, PA-C  predniSONE (DELTASONE) 20 MG tablet Take 2 tablets (40 mg total) by mouth daily with breakfast. For 3 more days, starting from tomorrow. 06/21/17   Lorella Nimrod, MD  PROAIR HFA 108 (90 BASE) MCG/ACT inhaler Inhale 2 puffs into the lungs every 4 (four) hours as needed for wheezing or shortness of breath.  06/24/13   [provider]  sertraline (ZOLOFT) 100 MG tablet Take 100 mg by mouth daily. 06/29/13   [provider]    Family History Family History  Problem Relation Age of Onset  . Diabetes Mellitus II Sister   . Diabetes Mellitus II Son   . Colon cancer  Neg Hx     Social History Social History   Tobacco Use  . Smoking status: Former Smoker    Last attempt to quit: 03/20/1987    Years since quitting: 30.3  . Smokeless tobacco: Never Used  Substance Use Topics  . Alcohol use: No  . Drug use: No     Allergies   Morphine and related and Tape   Review of Systems Review of Systems  Constitutional: Positive for fatigue. Negative for chills and fever.  Respiratory: Negative for cough, chest tightness and shortness of breath.   Cardiovascular: Negative for chest pain, palpitations and leg swelling.  Gastrointestinal: Negative for abdominal pain, diarrhea, nausea and vomiting.  Genitourinary: Negative for dysuria, flank pain, pelvic pain, vaginal  bleeding, vaginal discharge and vaginal pain.  Musculoskeletal: Positive for arthralgias and myalgias. Negative for neck pain and neck stiffness.  Skin: Negative for rash.  Neurological: Positive for weakness. Negative for dizziness and headaches.  All other systems reviewed and are negative.    Physical Exam Updated Vital Signs BP (!) 122/59   Pulse 62   Temp 98.5 F (36.9 C) (Oral)   Resp 17   Ht 5\' 4"  (1.626 m)   Wt 102.5 kg (226 lb)   SpO2 93%   BMI 38.79 kg/m   Physical Exam  Constitutional: She is oriented to person, place, and time. She appears well-developed and well-nourished. No distress.  HENT:  Head: Normocephalic.  Mouth/Throat: Oropharynx is clear and moist.  Eyes: Pupils are equal, round, and reactive to light. Conjunctivae and EOM are normal.  Neck: Normal range of motion. Neck supple.  Cardiovascular: Normal rate, regular rhythm and normal heart sounds.  Pulmonary/Chest: Effort normal and breath sounds normal. No respiratory distress. She has no wheezes. She has no rales.  Abdominal: Soft. Bowel sounds are normal. She exhibits no distension. There is tenderness. There is no rebound.  Slight tenderness in RUQ  Musculoskeletal: She exhibits no edema.    Neurological: She is alert and oriented to person, place, and time.  Skin: Skin is warm and dry.  Psychiatric: She has a normal mood and affect. Her behavior is normal.  Nursing note and vitals reviewed.    ED Treatments / Results  Labs (all labs ordered are listed, but only abnormal results are displayed) Labs Reviewed  COMPREHENSIVE METABOLIC PANEL - Abnormal; Notable for the following components:      Result Value   Glucose, Bld 198 (*)    BUN 5 (*)    Calcium 8.8 (*)    Total Protein 6.2 (*)    Albumin 3.0 (*)    AST 13 (*)    ALT 5 (*)    All other components within normal limits  URINALYSIS, ROUTINE W REFLEX MICROSCOPIC - Abnormal; Notable for the following components:   Ketones, ur 5 (*)    Leukocytes, UA SMALL (*)    Bacteria, UA RARE (*)    Squamous Epithelial / LPF 0-5 (*)    All other components within normal limits  LIPASE, BLOOD  CBC    EKG None  Radiology No results found.  Procedures Procedures (including critical care time)  Medications Ordered in ED Medications  sodium chloride 0.9 % bolus 500 mL (has no administration in time range)     Initial Impression / Assessment and Plan / ED Course  I have reviewed the triage vital signs and the nursing notes.  Pertinent labs & imaging results that were available during my care of the patient were reviewed by me and considered in my medical decision making (see chart for details).     Pt in ED with generalized weakeness and episode of cramping that now resolved. Pt on trial chemo injections for stage 4 non small lung ca, last injection 2 days ago.  Patient does state that she is feeling weak, however her vital signs are normal, her exam is unremarkable.  Will check labs, urine analysis, will hydrate with some fluids.  3:37 PM Patient's labs are reassuring.  Chest x-ray with no significant abnormalities.  Urine does not appear to show infection.  Patient was just given hydrocortisone yesterday for low  cortisol levels, which she should be continued to take.  Discussed results with her.  She is  comfortable going home.  She has an appointment with her endocrinologist in 2 days.  Vital signs all within normal.  Patient is in no acute distress.  She stable for discharge home.  I do not think she needs any further imaging, workup, or admission to the hospital.  Return precautions discussed.  Vitals:   07/28/17 1330 07/28/17 1415 07/28/17 1445 07/28/17 1530  BP: 112/61 139/62 (!) 156/75 139/73  Pulse: 61 65  64  Resp: 18 13  13   Temp:      TempSrc:      SpO2: 91% 97%  94%  Weight:      Height:         Final Clinical Impressions(s) / ED Diagnoses   Final diagnoses:  Weakness    ED Discharge Orders    None       Jeannett Senior, PA-C 07/28/17 1540    Pattricia Boss, MD 07/28/17 1614

## 2017-07-28 NOTE — ED Triage Notes (Signed)
Pt. Stated, Im having stomach pain, no energy and having muscle spasms . Im on a new medication, My sugar was low and then I drank some OJ and brought it up to 85.

## 2017-07-28 NOTE — Discharge Instructions (Addendum)
Continue all current medications including hydrocortisone. Labs today are reassuring. Follow up with your endocrinologist on Monday as scheduled. Return if worsening symptoms.

## 2017-07-28 NOTE — ED Notes (Signed)
Unable to obtain IV access.

## 2018-01-30 ENCOUNTER — Other Ambulatory Visit: Payer: Self-pay

## 2018-01-30 ENCOUNTER — Emergency Department (HOSPITAL_COMMUNITY): Payer: Medicare Other

## 2018-01-30 ENCOUNTER — Emergency Department (HOSPITAL_COMMUNITY)
Admission: EM | Admit: 2018-01-30 | Discharge: 2018-01-31 | Disposition: A | Discharge status: Home / Self Care | Payer: Medicare Other | Attending: Emergency Medicine | Admitting: Emergency Medicine

## 2018-01-30 ENCOUNTER — Encounter (HOSPITAL_COMMUNITY): Payer: Self-pay | Admitting: Emergency Medicine

## 2018-01-30 DIAGNOSIS — J449 Chronic obstructive pulmonary disease, unspecified: Secondary | ICD-10-CM | POA: Insufficient documentation

## 2018-01-30 DIAGNOSIS — Z794 Long term (current) use of insulin: Secondary | ICD-10-CM | POA: Insufficient documentation

## 2018-01-30 DIAGNOSIS — H8110 Benign paroxysmal vertigo, unspecified ear: Secondary | ICD-10-CM | POA: Diagnosis not present

## 2018-01-30 DIAGNOSIS — Z79899 Other long term (current) drug therapy: Secondary | ICD-10-CM | POA: Insufficient documentation

## 2018-01-30 DIAGNOSIS — Z87891 Personal history of nicotine dependence: Secondary | ICD-10-CM | POA: Insufficient documentation

## 2018-01-30 DIAGNOSIS — R42 Dizziness and giddiness: Secondary | ICD-10-CM | POA: Diagnosis present

## 2018-01-30 DIAGNOSIS — I10 Essential (primary) hypertension: Secondary | ICD-10-CM | POA: Diagnosis not present

## 2018-01-30 DIAGNOSIS — E119 Type 2 diabetes mellitus without complications: Secondary | ICD-10-CM | POA: Diagnosis not present

## 2018-01-30 LAB — CBG MONITORING, ED: Glucose-Capillary: 482 mg/dL — ABNORMAL HIGH (ref 70–99)

## 2018-01-30 LAB — CBC WITH DIFFERENTIAL/PLATELET
Abs Immature Granulocytes: 0.03 10*3/uL (ref 0.00–0.07)
BASOS ABS: 0 10*3/uL (ref 0.0–0.1)
Basophils Relative: 1 %
EOS PCT: 4 %
Eosinophils Absolute: 0.2 10*3/uL (ref 0.0–0.5)
HEMATOCRIT: 40.8 % (ref 36.0–46.0)
HEMOGLOBIN: 12.5 g/dL (ref 12.0–15.0)
IMMATURE GRANULOCYTES: 1 %
LYMPHS ABS: 1 10*3/uL (ref 0.7–4.0)
LYMPHS PCT: 16 %
MCH: 26.7 pg (ref 26.0–34.0)
MCHC: 30.6 g/dL (ref 30.0–36.0)
MCV: 87.2 fL (ref 80.0–100.0)
Monocytes Absolute: 0.3 10*3/uL (ref 0.1–1.0)
Monocytes Relative: 5 %
NEUTROS PCT: 73 %
NRBC: 0 % (ref 0.0–0.2)
Neutro Abs: 4.6 10*3/uL (ref 1.7–7.7)
Platelets: 255 10*3/uL (ref 150–400)
RBC: 4.68 MIL/uL (ref 3.87–5.11)
RDW: 13.5 % (ref 11.5–15.5)
WBC: 6.2 10*3/uL (ref 4.0–10.5)

## 2018-01-30 LAB — URINALYSIS, ROUTINE W REFLEX MICROSCOPIC
BACTERIA UA: NONE SEEN
BILIRUBIN URINE: NEGATIVE
Glucose, UA: >=500 mg/dL — AB
HGB URINE DIPSTICK: NEGATIVE
Ketones, ur: 5 mg/dL — AB
LEUKOCYTES UA: NEGATIVE
NITRITE: NEGATIVE
PROTEIN: NEGATIVE mg/dL
Specific Gravity, Urine: 1.017 (ref 1.005–1.030)
pH: 5 (ref 5.0–8.0)

## 2018-01-30 LAB — COMPREHENSIVE METABOLIC PANEL
ALBUMIN: 3.3 g/dL — AB (ref 3.5–5.0)
ALT: 12 U/L (ref 0–44)
AST: 14 U/L — ABNORMAL LOW (ref 15–41)
Alkaline Phosphatase: 80 U/L (ref 38–126)
Anion gap: 11 (ref 5–15)
BUN: 16 mg/dL (ref 8–23)
CHLORIDE: 104 mmol/L (ref 98–111)
CO2: 22 mmol/L (ref 22–32)
CREATININE: 0.87 mg/dL (ref 0.44–1.00)
Calcium: 9.3 mg/dL (ref 8.9–10.3)
GFR calc non Af Amer: >60 mL/min (ref >60)
Glucose, Bld: 399 mg/dL — ABNORMAL HIGH (ref 70–99)
Potassium: 4.4 mmol/L (ref 3.5–5.1)
SODIUM: 137 mmol/L (ref 135–145)
Total Bilirubin: 0.5 mg/dL (ref 0.3–1.2)
Total Protein: 6.8 g/dL (ref 6.5–8.1)

## 2018-01-30 LAB — I-STAT TROPONIN, ED: TROPONIN I, POC: 0 ng/mL (ref 0.00–0.08)

## 2018-01-30 MED ORDER — MECLIZINE HCL 25 MG PO TABS: 25.0000 mg | ORAL_TABLET | Freq: Three times a day (TID) | ORAL | 0 refills | Status: DC | PRN

## 2018-01-30 MED ORDER — DIAZEPAM 5 MG PO TABS: 5.0000 mg | ORAL_TABLET | Freq: Two times a day (BID) | ORAL | 0 refills | Status: DC | PRN

## 2018-01-30 NOTE — ED Notes (Signed)
Patient verbalizes understanding of medications and discharge instructions. No further questions at this time. VSS and patient ambulatory at discharge.   

## 2018-01-30 NOTE — Discharge Instructions (Signed)
Return to the ER immediately if you have any worsening symptoms.  Contact your primary care doctor and your ENT doctor tomorrow for further work-up.  Take the meclizine as needed for dizziness.  You may also try using the Valium.  Do not take Xanax if you are taking the Valium.

## 2018-01-30 NOTE — ED Provider Notes (Signed)
[facility] EMERGENCY DEPARTMENT Provider Note   CSN: [id number] Arrival date & time: 01/30/18  1741     History   Chief Complaint Chief Complaint  Patient presents with  . Diarrhea  . Dizziness    HPI [name] is a 72 y.o. female.  Patient presents to the ER for evaluation of dizziness.  Patient reports that she has been experiencing severe dizziness for the last 3 days.  She reports that she rolled over in bed 3 mornings ago and suddenly felt like the room was spinning.  Since then, she has severe sense of spinning, dizziness and feeling like she is going to pass out whenever she moves her head.  At sometimes the sensation is more mild than others, but reproduces with movement each time.  No numbness, tingling, weakness of any extremities.  No headache.  She has had nausea but no vomiting.  Has had some slight diarrhea.  No abdominal pain.  No chest pain, no shortness of breath.     Past Medical History:  Diagnosis Date  . Asthma   . Bleeding nose   . Cancer ([place]) 2017   Lung   . Diabetes mellitus   . Diverticulitis   . H. pylori infection   . Hypertension   . Lung nodule 2017   Both lungs-pt at [facility].   . Parkinson disease ([place]) 2010  . Tubular adenoma of colon 05/2007    Patient Active Problem List   Diagnosis Date Noted  . COPD exacerbation ([place]) 06/19/2017  . Non-small cell carcinoma of lung, stage 4 ([place]) 06/19/2017  . Parkinson's disease ([place]) 06/19/2017  . Tamponade 02/28/2017  . Pericardial effusion 02/28/2017  . Chest pain 02/26/2016  . Colitis 03/02/2015  . Diabetes mellitus type 2, controlled ([place]) 03/02/2015  . Hyperlipidemia 03/02/2015  . OSA (obstructive sleep apnea) 05/10/2012  . Essential tremor 03/19/2012  . DIVERTICULITIS OF COLON 02/03/2008  . ANEMIA, IRON DEFICIENCY 12/27/2007  . HEMORRHOIDS, INTERNAL 12/27/2007  . GERD 12/27/2007  . GASTRITIS 12/27/2007  . DIVERTICULOSIS, COLON 12/27/2007  . PUD, HX OF  12/27/2007  . COLONIC POLYPS, HX OF 12/27/2007  . Generalized anxiety disorder 04/23/2007  . Essential hypertension 04/23/2007  . ARTHRITIS 04/23/2007  . SLEEP APNEA 04/23/2007  . Type 2 diabetes mellitus with peripheral neuropathy ([place]) 04/25/1991  . Asthma 04/23/1987    Past Surgical History:  Procedure Laterality Date  . APPENDECTOMY    . CHOLECYSTECTOMY    . COLON SURGERY     colectomy and reversal after diverticular ds  . PARTIAL COLECTOMY    . PERICARDIAL WINDOW N/A 02/28/2017   Procedure: PERICARDIAL WINDOW;  Surgeon: [name], MD;  Location: [facility] OR;  Service: Thoracic;  Laterality: N/A;  . TONSILLECTOMY    . TOTAL KNEE ARTHROPLASTY     right  . VAGINAL HYSTERECTOMY       OB History   None      Home Medications    Prior to Admission medications   Medication Sig Start Date End Date Taking? Authorizing Provider  ALPRAZolam Duanne Moron) 1 MG tablet Take 1 tablet (1 mg total) 2 (two) times daily as needed by mouth for anxiety. 03/05/17   Gold, Wayne E, PA-C  azithromycin (ZITHROMAX) 250 MG tablet Take 1 tablet of  250 mg daily, starting from tomorrow for 3 more days. 06/21/17   Lorella Nimrod, MD  carbidopa-levodopa (SINEMET IR) 25-250 MG tablet Take 1 tablet by mouth at bedtime.    [provider]  diazepam (VALIUM) 5 MG tablet Take 1 tablet (5 mg total) by mouth every 12 (twelve) hours as needed (vertigo (dizziness)). 01/30/18   Orpah Greek, MD  fentaNYL (DURAGESIC - DOSED MCG/HR) 100 MCG/HR Place 100 mcg every 3 (three) days onto the skin. 02/21/17   [provider]  Fluticasone-Salmeterol (ADVAIR) 500-50 MCG/DOSE AEPB Inhale 1 puff into the lungs every 12 (twelve) hours.      [provider]  furosemide (LASIX) 40 MG tablet Take 1 tablet (40 mg total) daily by mouth. Patient taking differently: Take 40 mg by mouth daily as needed for fluid.  03/06/17   Gold, Wayne E, PA-C  gabapentin (NEURONTIN) 300 MG capsule Take 900 mg at bedtime  by mouth. 02/21/17   [provider]  HUMALOG KWIKPEN 100 UNIT/ML KiwkPen Inject 0.12 mLs (12 Units total) into the skin 3 (three) times daily before meals. Along with your normal sliding scale. 06/20/17   Lorella Nimrod, MD  hydrocortisone 2.5 % cream Apply 1 application topically as needed (itching).  01/31/17   [provider]  insulin glargine (LANTUS) 100 UNIT/ML injection Inject 65 Units into the skin at bedtime.     [provider]  ipratropium-albuterol (DUONEB) 0.5-2.5 (3) MG/3ML SOLN Take 3 mLs by nebulization every 6 (six) hours as needed. 06/20/17   Lorella Nimrod, MD  meclizine (ANTIVERT) 25 MG tablet Take 1 tablet (25 mg total) by mouth 3 (three) times daily as needed for dizziness. 01/30/18   Orpah Greek, MD  omeprazole (PRILOSEC) 40 MG capsule Take 1 capsule (40 mg total) by mouth 2 (two) times daily. Patient taking differently: Take 40 mg by mouth daily as needed (heartburn).  04/20/16   Ladene Artist, MD  Oxycodone HCl 20 MG TABS Take 20-40 mg every 4 (four) hours as needed by mouth for pain. 02/21/17   [provider]  potassium chloride SA (K-DUR,KLOR-CON) 20 MEQ tablet Take 1 tablet (20 mEq total) daily by mouth. Patient not taking: Reported on 06/19/2017 03/06/17   Jadene Pierini E, PA-C  predniSONE (DELTASONE) 20 MG tablet Take 2 tablets (40 mg total) by mouth daily with breakfast. For 3 more days, starting from tomorrow. 06/21/17   Lorella Nimrod, MD  PROAIR HFA 108 (90 BASE) MCG/ACT inhaler Inhale 2 puffs into the lungs every 4 (four) hours as needed for wheezing or shortness of breath.  06/24/13   [provider]  sertraline (ZOLOFT) 100 MG tablet Take 100 mg by mouth daily. 06/29/13   [provider]    Family History Family History  Problem Relation Age of Onset  . Diabetes Mellitus II Sister   . Diabetes Mellitus II Son   . Colon cancer Neg Hx     Social History Social History   Tobacco Use  . Smoking  status: Former Smoker    Last attempt to quit: 03/20/1987    Years since quitting: 30.8  . Smokeless tobacco: Never Used  Substance Use Topics  . Alcohol use: No  . Drug use: No     Allergies   Morphine and related and Tape   Review of Systems Review of Systems  Constitutional: Positive for fatigue.  Gastrointestinal: Positive for diarrhea and nausea.  Neurological: Positive for dizziness.  All other systems reviewed and are negative.    Physical Exam Updated Vital Signs BP (!) 120/43   Pulse 72   Temp 98.6 F (37 C) (Oral)   Resp 12   Ht 5\' 4"  (1.626  m)   Wt 113.4 kg   SpO2 92%   BMI 42.91 kg/m   Physical Exam  Constitutional: She is oriented to person, place, and time. She appears well-developed and well-nourished. No distress.  HENT:  Head: Normocephalic and atraumatic.  Right Ear: Hearing normal.  Left Ear: Hearing normal.  Nose: Nose normal.  Mouth/Throat: Oropharynx is clear and moist and mucous membranes are normal.  Eyes: Pupils are equal, round, and reactive to light. Conjunctivae and EOM are normal.  Neck: Normal range of motion. Neck supple.  Cardiovascular: Regular rhythm, S1 normal and S2 normal. Exam reveals no gallop and no friction rub.  No murmur heard. Pulmonary/Chest: Effort normal and breath sounds normal. No respiratory distress. She exhibits no tenderness.  Abdominal: Soft. Normal appearance and bowel sounds are normal. There is no hepatosplenomegaly. There is no tenderness. There is no rebound, no guarding, no tenderness at McBurney's point and negative Murphy's sign. No hernia.  Musculoskeletal: Normal range of motion.  Neurological: She is alert and oriented to person, place, and time. She has normal strength. No cranial nerve deficit or sensory deficit. Coordination normal. GCS eye subscore is 4. GCS verbal subscore is 5. GCS motor subscore is 6.  Extraocular muscle movement: normal No visual field cut Pupils: equal and reactive both  direct and consensual response is normal No nystagmus present    Sensory function is intact to light touch, pinprick Proprioception intact  Grip strength 5/5 symmetric in upper extremities No pronator drift Light endpoint dysmetria with finger to nose bilaterally, symmetric  Lower extremity strength 5/5 against gravity Normal heel to shin bilaterally    Skin: Skin is warm, dry and intact. No rash noted. No cyanosis.  Psychiatric: She has a normal mood and affect. Her speech is normal and behavior is normal. Thought content normal.  Nursing note and vitals reviewed.    ED Treatments / Results  Labs (all labs ordered are listed, but only abnormal results are displayed) Labs Reviewed  COMPREHENSIVE METABOLIC PANEL - Abnormal; Notable for the following components:      Result Value   Glucose, Bld 399 (*)    Albumin 3.3 (*)    AST 14 (*)    All other components within normal limits  URINALYSIS, ROUTINE W REFLEX MICROSCOPIC - Abnormal; Notable for the following components:   Color, Urine STRAW (*)    Glucose, UA >=500 (*)    Ketones, ur 5 (*)    All other components within normal limits  CBG MONITORING, ED - Abnormal; Notable for the following components:   Glucose-Capillary 482 (*)    All other components within normal limits  URINE CULTURE  CBC WITH DIFFERENTIAL/PLATELET  I-STAT TROPONIN, ED    EKG EKG Interpretation  Date/Time:  Wednesday January 30 2018 19:01:48 EDT Ventricular Rate:  68 PR Interval:  174 QRS Duration: 132 QT Interval:  434 QTC Calculation: 461 R Axis:   62 Text Interpretation:  Normal sinus rhythm Right bundle branch block Septal infarct , age undetermined Abnormal ECG No significant change since last tracing Confirmed by Orpah Greek [phone]) on 01/30/2018 11:17:45 PM   Radiology Dg Chest 2 View  Result Date: 01/30/2018 CLINICAL DATA:  Dizziness and weakness. History of bilateral lung cancer last treatment with radiation  approximately 18 months ago. EXAM: CHEST - 2 VIEW COMPARISON:  07/28/2017 FINDINGS: Mild aortic atherosclerosis without aneurysm. Normal heart size. Mild diffuse interstitial change without alveolar consolidation or CHF. No effusion or pneumothorax. No aggressive osseous lesions. IMPRESSION:  No active cardiopulmonary disease. Nonspecific mild diffuse interstitial prominence. Electronically Signed   By: Ashley Royalty M.D.   On: 01/30/2018 20:18   Ct Head Wo Contrast  Result Date: 01/30/2018 CLINICAL DATA:  Dizziness, nausea and blurry vision starting Monday. EXAM: CT HEAD WITHOUT CONTRAST TECHNIQUE: Contiguous axial images were obtained from the base of the skull through the vertex without intravenous contrast. COMPARISON:  MRI 03/25/2012 FINDINGS: Brain: Mild age related involutional changes of the brain without acute intracranial hemorrhage, midline shift or edema. No large vascular territory infarction. Minimal small vessel ischemic disease of periventricular white matter is suggested. No hydrocephalus. Midline fourth ventricle basal cisterns. Brainstem and cerebellum are nonacute. Vascular: Atherosclerosis at the skull base. No hyperdense vessel sign. Skull: Intact.  Hyperostosis noted. Sinuses/Orbits: Osteoma noted in the left ethmoid sinus. Otherwise, paranasal sinuses are unremarkable. Intact orbits and globes. Other: None IMPRESSION: No acute intracranial abnormality. Mild involutional changes brain minimal small vessel ischemia. Electronically Signed   By: Ashley Royalty M.D.   On: 01/30/2018 20:16    Procedures Procedures (including critical care time)  Medications Ordered in ED Medications - No data to display   Initial Impression / Assessment and Plan / ED Course  I have reviewed the triage vital signs and the nursing notes.  Pertinent labs & imaging results that were available during my care of the patient were reviewed by me and considered in my medical decision making (see chart for  details).     Patient presents to the emergency department for evaluation of dizziness.  Patient has what sounds like a benign positional vertigo.  Patient rolled over in bed 3 days ago and had sudden onset of vertiginous symptoms that are reproducible with movement.  Neurologic examination is nonfocal.  Patient's work-up thus far has been unrevealing other than hyperglycemia.  She reports that she has not taken her meds this evening because she was in the waiting room for 5 hours.  She had a chocolate milk just prior to coming back to the exam room.  She is not ketotic.  Had a lengthy conversation with the patient and her son.  I recommended MRI/MRA to rule out central vertigo.  Patient reports that she feels a little better now does not want to be in the ER " all night".  Did discuss with her the fact that I cannot rule out stroke as a cause of her symptoms and this could become significantly debilitating or even life-threatening, she understands this.  Her son is present today and is in agreement with her plan to go home.  Patient has declined any insulin to treatment for her blood sugar, reports that she will check her sugar when she gets home and administer her own insulin.  She will contact her primary care doctor and her ENT doctor at [facility] tomorrow.  Patient and son understand that they come back anytime to pick up her left off and complete the work-up.  Final Clinical Impressions(s) / ED Diagnoses   Final diagnoses:  Benign paroxysmal positional vertigo, unspecified laterality    ED Discharge Orders         Ordered    diazepam (VALIUM) 5 MG tablet  Every 12 hours PRN     01/30/18 2335    meclizine (ANTIVERT) 25 MG tablet  3 times daily PRN     01/30/18 2335           Orpah Greek, MD 01/30/18 2336

## 2018-01-30 NOTE — ED Provider Notes (Signed)
MSE was initiated and I personally evaluated the patient and placed orders (if any) at  7:13 PM on January 30, 2018.  The patient appears stable so that the remainder of the MSE may be completed by another provider.  Patient placed in Quick Look pathway, seen and evaluated   Chief Complaint: dizziness  HPI:   72 year old female with a past medical history of diabetes, hypertension, Parkinson's disease, presents to ED for evaluation of multiple complaints.  Her first complaint is dizziness, lightheadedness and blurry vision for the past 3 days.  States that she feels that the room is spinning around her any time she has sudden movements such as rolling over in bed or standing up from a bent position.  She reports nausea associated with this.  She also reports right middle back pain for the past 3 days.  Her next complaint is diarrhea with 2-3 episodes since this morning.  She denies any chest pain but reports shortness of breath.  Denies any vomiting, hematochezia or melena or sick contacts with similar symptoms.  Was told she had vertigo when she was "younger" but is unsure if this feels similar.  Denies any injuries or falls, numbness in arms or legs.  ROS: dizziness (one)  Physical Exam:   Gen: No distress  Neuro: Awake and Alert  Skin: Warm    Focused Exam: PERRL. No facial asymmetry noted. Horizontal nystagmus noted bilaterally.  Right CVA tenderness. Lungs CTAB. RRR.   Initiation of care has begun. The patient has been counseled on the process, plan, and necessity for staying for the completion/evaluation, and the remainder of the medical screening examination    Delia Heady, PA-C 01/30/18 [place], [place], DO 01/30/18 2350

## 2018-01-30 NOTE — ED Triage Notes (Signed)
Pt reports Nausea, diarrhea, dizziness and blurred vision that started Monday. Denies vomiting or pain. Pt reports some difficulty walking because of dizziness.

## 2018-02-01 LAB — URINE CULTURE

## 2018-10-09 ENCOUNTER — Ambulatory Visit (INDEPENDENT_AMBULATORY_CARE_PROVIDER_SITE_OTHER): Payer: Medicare Other

## 2018-10-09 ENCOUNTER — Encounter (HOSPITAL_COMMUNITY): Payer: Self-pay | Admitting: Emergency Medicine

## 2018-10-09 ENCOUNTER — Ambulatory Visit (HOSPITAL_COMMUNITY)
Admission: EM | Admit: 2018-10-09 | Discharge: 2018-10-09 | Disposition: A | Discharge status: Home / Self Care | Payer: Medicare Other | Attending: Internal Medicine | Admitting: Internal Medicine

## 2018-10-09 ENCOUNTER — Other Ambulatory Visit: Payer: Self-pay

## 2018-10-09 DIAGNOSIS — W010XXA Fall on same level from slipping, tripping and stumbling without subsequent striking against object, initial encounter: Secondary | ICD-10-CM

## 2018-10-09 DIAGNOSIS — W19XXXA Unspecified fall, initial encounter: Secondary | ICD-10-CM

## 2018-10-09 DIAGNOSIS — S92505A Nondisplaced unspecified fracture of left lesser toe(s), initial encounter for closed fracture: Secondary | ICD-10-CM | POA: Diagnosis not present

## 2018-10-09 DIAGNOSIS — S99921A Unspecified injury of right foot, initial encounter: Secondary | ICD-10-CM | POA: Diagnosis not present

## 2018-10-09 NOTE — Discharge Instructions (Addendum)
Possible slight fracture to the foot versus artifact.  We will put you  in post op shoe and have you follow up with orthopedics.  You can take ibuprofen as needed.  Rest, ice, elevate Follow up as needed for continued or worsening symptoms

## 2018-10-09 NOTE — ED Triage Notes (Signed)
Pt states on Monday she tripped over her dog and tangled her right foot up, c/o pain in her R foot, thinks some toes are broken. Pt also c/o mild pain in her R rib and hip area. Denies hitting head or LOC.

## 2018-10-10 NOTE — ED Provider Notes (Signed)
[place]    CSN: [id number] Arrival date & time: 10/09/18  1348     History   Chief Complaint Chief Complaint  Patient presents with   Fall   Foot Pain    HPI [name] is a 73 y.o. female.   Pt is a 73 year old female that presents with right foot pain.  Patient reporting that approximately 2 days ago she tripped over her dog entangled her right foot up.  Most of the pain is to the second toe and top of the foot.  She is also having mild pain in the right rib and hip area.  She is able to ambulate.  Denies hitting her head or any loss of consciousness.  She has been icing the foot.  Patient takes fentanyl patches for chronic pain due to cancer.  No numbness, tingling or fevers.  ROS per HPI      Past Medical History:  Diagnosis Date   Asthma    Bleeding nose    Cancer ([place]) 2017   Lung    Diabetes mellitus    Diverticulitis    H. pylori infection    Hypertension    Lung nodule 2017   Both lungs[facility] at [facility]    Parkinson disease ([place]) 2010   Tubular adenoma of colon 05/2007    Patient Active Problem List   Diagnosis Date Noted   COPD exacerbation ([place]) 06/19/2017   Non-small cell carcinoma of lung, stage 4 ([place]) 06/19/2017   Parkinson's disease ([place]) 06/19/2017   Tamponade 02/28/2017   Pericardial effusion 02/28/2017   Chest pain 02/26/2016   Colitis 03/02/2015   Diabetes mellitus type 2, controlled ([place]) 03/02/2015   Hyperlipidemia 03/02/2015   OSA (obstructive sleep apnea) 05/10/2012   Essential tremor 03/19/2012   DIVERTICULITIS OF COLON 02/03/2008   ANEMIA, IRON DEFICIENCY 12/27/2007   HEMORRHOIDS, INTERNAL 12/27/2007   GERD 12/27/2007   GASTRITIS 12/27/2007   DIVERTICULOSIS, COLON 12/27/2007   PUD, HX OF 12/27/2007   COLONIC POLYPS, HX OF 12/27/2007   Generalized anxiety disorder 04/23/2007   Essential hypertension 04/23/2007   ARTHRITIS 04/23/2007   SLEEP APNEA 04/23/2007   Type 2  diabetes mellitus with peripheral neuropathy ([place]) 04/25/1991   Asthma 04/23/1987    Past Surgical History:  Procedure Laterality Date   APPENDECTOMY     CHOLECYSTECTOMY     COLON SURGERY     colectomy and reversal after diverticular ds   PARTIAL COLECTOMY     PERICARDIAL WINDOW N/A 02/28/2017   Procedure: PERICARDIAL WINDOW;  Surgeon: [name], MD;  Location: [facility] OR;  Service: Thoracic;  Laterality: N/A;   TONSILLECTOMY     TOTAL KNEE ARTHROPLASTY     right   VAGINAL HYSTERECTOMY      OB History   No obstetric history on file.      Home Medications    Prior to Admission medications   Medication Sig Start Date End Date Taking? Authorizing Provider  ALPRAZolam Duanne Moron) 1 MG tablet Take 1 tablet (1 mg total) 2 (two) times daily as needed by mouth for anxiety. 03/05/17   Gold, Wayne E, PA-C  azithromycin (ZITHROMAX) 250 MG tablet Take 1 tablet of  250 mg daily, starting from tomorrow for 3 more days. 06/21/17   Lorella Nimrod, MD  carbidopa-levodopa (SINEMET IR) 25-250 MG tablet Take 1 tablet by mouth at bedtime.    [provider]  diazepam (VALIUM) 5 MG tablet Take 1 tablet (5 mg total) by mouth  every 12 (twelve) hours as needed (vertigo (dizziness)). 01/30/18   Orpah Greek, MD  fentaNYL (DURAGESIC - DOSED MCG/HR) 100 MCG/HR Place 100 mcg every 3 (three) days onto the skin. 02/21/17   [provider]  Fluticasone-Salmeterol (ADVAIR) 500-50 MCG/DOSE AEPB Inhale 1 puff into the lungs every 12 (twelve) hours.      [provider]  furosemide (LASIX) 40 MG tablet Take 1 tablet (40 mg total) daily by mouth. Patient taking differently: Take 40 mg by mouth daily as needed for fluid.  03/06/17   Gold, Wayne E, PA-C  gabapentin (NEURONTIN) 300 MG capsule Take 900 mg at bedtime by mouth. 02/21/17   [provider]  HUMALOG KWIKPEN 100 UNIT/ML KiwkPen Inject 0.12 mLs (12 Units total) into the skin 3 (three) times daily before meals.  Along with your normal sliding scale. 06/20/17   Lorella Nimrod, MD  hydrocortisone 2.5 % cream Apply 1 application topically as needed (itching).  01/31/17   [provider]  insulin glargine (LANTUS) 100 UNIT/ML injection Inject 65 Units into the skin at bedtime.     [provider]  ipratropium-albuterol (DUONEB) 0.5-2.5 (3) MG/3ML SOLN Take 3 mLs by nebulization every 6 (six) hours as needed. 06/20/17   Lorella Nimrod, MD  meclizine (ANTIVERT) 25 MG tablet Take 1 tablet (25 mg total) by mouth 3 (three) times daily as needed for dizziness. 01/30/18   Orpah Greek, MD  omeprazole (PRILOSEC) 40 MG capsule Take 1 capsule (40 mg total) by mouth 2 (two) times daily. Patient taking differently: Take 40 mg by mouth daily as needed (heartburn).  04/20/16   Ladene Artist, MD  Oxycodone HCl 20 MG TABS Take 20-40 mg every 4 (four) hours as needed by mouth for pain. 02/21/17   [provider]  potassium chloride SA (K-DUR,KLOR-CON) 20 MEQ tablet Take 1 tablet (20 mEq total) daily by mouth. Patient not taking: Reported on 06/19/2017 03/06/17   John Giovanni, PA-C  PROAIR HFA 108 (90 BASE) MCG/ACT inhaler Inhale 2 puffs into the lungs every 4 (four) hours as needed for wheezing or shortness of breath.  06/24/13   [provider]  sertraline (ZOLOFT) 100 MG tablet Take 100 mg by mouth daily. 06/29/13   [provider]    Family History Family History  Problem Relation Age of Onset   Diabetes Mellitus II Sister    Diabetes Mellitus II Son    Colon cancer Neg Hx     Social History Social History   Tobacco Use   Smoking status: Former Smoker    Quit date: 03/20/1987    Years since quitting: 31.5   Smokeless tobacco: Never Used  Substance Use Topics   Alcohol use: No   Drug use: No     Allergies   Morphine and related and Tape   Review of Systems Review of Systems   Physical Exam Triage Vital Signs ED Triage Vitals  Enc Vitals  Group     BP 10/09/18 1416 (!) 141/64     Pulse Rate 10/09/18 1416 88     Resp 10/09/18 1416 18     Temp 10/09/18 1416 (!) 97.5 F (36.4 C)     Temp src --      SpO2 10/09/18 1416 100 %     Weight --      Height --      Head Circumference --      Peak Flow --      Pain Score  10/09/18 1418 3     Pain Loc --      Pain Edu? --      Excl. in [place]? --    No data found.  Updated Vital Signs BP (!) 141/64    Pulse 88    Temp (!) 97.5 F (36.4 C)    Resp 18    SpO2 100%   Visual Acuity Right Eye Distance:   Left Eye Distance:   Bilateral Distance:    Right Eye Near:   Left Eye Near:    Bilateral Near:     Physical Exam Vitals signs and nursing note reviewed.  Constitutional:      General: She is not in acute distress.    Appearance: Normal appearance. She is not ill-appearing, toxic-appearing or diaphoretic.  HENT:     Head: Normocephalic and atraumatic.     Nose: Nose normal.  Eyes:     Conjunctiva/sclera: Conjunctivae normal.  Neck:     Musculoskeletal: Normal range of motion.  Pulmonary:     Effort: Pulmonary effort is normal.  Musculoskeletal:        General: Swelling and tenderness present.     Comments: Swelling, erythema and bruising to the right 2nd toe extending into the top of foot.  Tender to touch.  Strong  pedal pulse.  Can wiggle toes.  Good ROM of the ankle.   Skin:    General: Skin is warm and dry.  Neurological:     Mental Status: She is alert.  Psychiatric:        Mood and Affect: Mood normal.      UC Treatments / Results  Labs (all labs ordered are listed, but only abnormal results are displayed) Labs Reviewed - No data to display  EKG None  Radiology Dg Foot Complete Right  Result Date: 10/09/2018 CLINICAL DATA:  Pain status post fall EXAM: RIGHT FOOT COMPLETE - 3+ VIEW COMPARISON:  None. FINDINGS: There is soft tissue swelling about the forefoot with no evidence of a displaced fracture or dislocation. There are mild degenerative  changes of the midfoot. Minimal vascular calcifications are noted. The osseous mineralization is mildly decreased. Evaluation of the digits is somewhat limited by suboptimal lateral view. There is a lucency that appears to be extending through the distal phalanx of the third digit. This is only visualized on the lateral view. IMPRESSION: 1. Soft tissue swelling about the forefoot. 2. Subtle lucency extending through the base of the distal phalanx of the third digit, only visualized on the lateral view. This could represent artifact but should be correlated with physical exam and point tenderness. 3. If symptoms persist, repeat radiographs are recommended in 10-14 days for further evaluation. Electronically Signed   By: Constance Holster M.D.   On: 10/09/2018 15:10    Procedures Procedures (including critical care time)  Medications Ordered in UC Medications - No data to display  Initial Impression / Assessment and Plan / UC Course  I have reviewed the triage vital signs and the nursing notes.  Pertinent labs & imaging results that were available during my care of the patient were reviewed by me and considered in my medical decision making (see chart for details).     Fracture to 2nd toe.  Placed in post of shoe.  RICE Follow up as needed for continued or worsening symptoms  Final Clinical Impressions(s) / UC Diagnoses   Final diagnoses:  None     Discharge Instructions     Possible slight fracture  to the foot versus artifact.  We will put you  in post op shoe and have you follow up with orthopedics.  You can take ibuprofen as needed.  Rest, ice, elevate Follow up as needed for continued or worsening symptoms     ED Prescriptions    None     Controlled Substance Prescriptions [place] Controlled Substance Registry consulted? no   Orvan July, NP 10/10/18 1413

## 2019-02-28 ENCOUNTER — Ambulatory Visit: Payer: Self-pay

## 2019-02-28 ENCOUNTER — Ambulatory Visit: Payer: Medicare Other | Admitting: Orthopaedic Surgery

## 2019-02-28 ENCOUNTER — Other Ambulatory Visit: Payer: Self-pay

## 2019-02-28 DIAGNOSIS — M25561 Pain in right knee: Secondary | ICD-10-CM | POA: Diagnosis not present

## 2019-02-28 DIAGNOSIS — M25562 Pain in left knee: Secondary | ICD-10-CM | POA: Diagnosis not present

## 2019-02-28 DIAGNOSIS — M5441 Lumbago with sciatica, right side: Secondary | ICD-10-CM | POA: Diagnosis not present

## 2019-02-28 DIAGNOSIS — M5442 Lumbago with sciatica, left side: Secondary | ICD-10-CM

## 2019-02-28 DIAGNOSIS — G8929 Other chronic pain: Secondary | ICD-10-CM

## 2019-02-28 NOTE — Progress Notes (Signed)
Office Visit Note   Patient: [name]           Date of Birth: Aug 17, 1945           MRN: [id number] Visit Date: 02/28/2019              Requested by: No referring provider defined for this encounter. PCP: Patient, No Pcp Per   Assessment & Plan: Visit Diagnoses:  1. Chronic pain of both knees   2. Chronic bilateral low back pain with bilateral sciatica     Plan: Impression is lumbar radiculopathy.  Patient has stage IV cancer therefore we will obtain MRI of the lumbar spine to rule out metastatic disease in this area.  Follow-up after the MRI.  In terms of her bilateral knee pain this is a chronic issue.  My sense is that she has failed conservative treatment but not interested in invasive treatments.  Follow-Up Instructions: Return in about 2 weeks (around 03/14/2019).   Orders:  Orders Placed This Encounter  Procedures  . XR Lumbar Spine 2-3 Views  . XR KNEE 3 VIEW RIGHT  . XR KNEE 3 VIEW LEFT   No orders of the defined types were placed in this encounter.     Procedures: No procedures performed   Clinical Data: No additional findings.   Subjective: Chief Complaint  Patient presents with  . Right Knee - Pain  . Left Knee - Pain  . Lower Back - Pain    [name] is a 73 year old female comes in for evaluation of low back pain lower extremity radiculopathy.  She endorses chronic numbness and tingling and burning pain and chronic back pain.  She states that she has chronic bilateral knee pain which she has learned to deal with.  She has metastatic stage IV lung cancer with metastases to the spine ribs and femur.   Review of Systems  Constitutional: Negative.   HENT: Negative.   Eyes: Negative.   Respiratory: Negative.   Cardiovascular: Negative.   Endocrine: Negative.   Musculoskeletal: Negative.   Neurological: Negative.   Hematological: Negative.   Psychiatric/Behavioral: Negative.   All other systems reviewed and are negative.     Objective: Vital Signs: There were no vitals taken for this visit.  Physical Exam Vitals signs and nursing note reviewed.  Constitutional:      Appearance: She is well-developed.  Pulmonary:     Effort: Pulmonary effort is normal.  Skin:    General: Skin is warm.     Capillary Refill: Capillary refill takes less than 2 seconds.  Neurological:     Mental Status: She is alert and oriented to person, place, and time.  Psychiatric:        Behavior: Behavior normal.        Thought Content: Thought content normal.        Judgment: Judgment normal.     Ortho Exam Bilateral lower extremity exam shows no focal motor or sensory deficits.  No pathologic reflexes.  Lumbar spine is tender to palpation.  Positive sciatic tension sign. Specialty Comments:  No specialty comments available.  Imaging: Xr Knee 3 View Left  Result Date: 02/28/2019 Moderate tricompartmental degenerative joint disease with chondrocalcinosis  Xr Knee 3 View Right  Result Date: 02/28/2019 Stable appearing right total knee replacement without complication.  Xr Lumbar Spine 2-3 Views  Result Date: 02/28/2019 Multilevel degenerative disc disease and lumbar spondylosis.  No obvious evidence of neoplasm or metastatic disease    PMFS History: Patient  Active Problem List   Diagnosis Date Noted  . COPD exacerbation ([place]) 06/19/2017  . Non-small cell carcinoma of lung, stage 4 ([place]) 06/19/2017  . Parkinson's disease ([place]) 06/19/2017  . Tamponade 02/28/2017  . Pericardial effusion 02/28/2017  . Chest pain 02/26/2016  . Colitis 03/02/2015  . Diabetes mellitus type 2, controlled ([place]) 03/02/2015  . Hyperlipidemia 03/02/2015  . OSA (obstructive sleep apnea) 05/10/2012  . Essential tremor 03/19/2012  . DIVERTICULITIS OF COLON 02/03/2008  . ANEMIA, IRON DEFICIENCY 12/27/2007  . HEMORRHOIDS, INTERNAL 12/27/2007  . GERD 12/27/2007  . GASTRITIS 12/27/2007  . DIVERTICULOSIS, COLON 12/27/2007  . PUD, HX OF  12/27/2007  . COLONIC POLYPS, HX OF 12/27/2007  . Generalized anxiety disorder 04/23/2007  . Essential hypertension 04/23/2007  . ARTHRITIS 04/23/2007  . SLEEP APNEA 04/23/2007  . Type 2 diabetes mellitus with peripheral neuropathy ([place]) 04/25/1991  . Asthma 04/23/1987   Past Medical History:  Diagnosis Date  . Asthma   . Bleeding nose   . Cancer ([place]) 2017   Lung   . Diabetes mellitus   . Diverticulitis   . H. pylori infection   . Hypertension   . Lung nodule 2017   Both lungs[facility]pt at [facility].   . Parkinson disease ([place]) 2010  . Tubular adenoma of colon 05/2007    Family History  Problem Relation Age of Onset  . Diabetes Mellitus II Sister   . Diabetes Mellitus II Son   . Colon cancer Neg Hx     Past Surgical History:  Procedure Laterality Date  . APPENDECTOMY    . CHOLECYSTECTOMY    . COLON SURGERY     colectomy and reversal after diverticular ds  . PARTIAL COLECTOMY    . PERICARDIAL WINDOW N/A 02/28/2017   Procedure: PERICARDIAL WINDOW;  Surgeon: [name], MD;  Location: [facility] OR;  Service: Thoracic;  Laterality: N/A;  . TONSILLECTOMY    . TOTAL KNEE ARTHROPLASTY     right  . VAGINAL HYSTERECTOMY     Social History   Occupational History  . Occupation: retired    Comment: Charity fundraiser; [employer]  Tobacco Use  . Smoking status: Former Smoker    Quit date: 03/20/1987    Years since quitting: 31.9  . Smokeless tobacco: Never Used  Substance and Sexual Activity  . Alcohol use: No  . Drug use: No  . Sexual activity: Never

## 2019-03-04 ENCOUNTER — Other Ambulatory Visit: Payer: Self-pay | Admitting: *Deleted

## 2019-03-04 ENCOUNTER — Telehealth: Payer: Self-pay | Admitting: *Deleted

## 2019-03-04 DIAGNOSIS — M4807 Spinal stenosis, lumbosacral region: Secondary | ICD-10-CM

## 2019-03-04 NOTE — Telephone Encounter (Signed)
Pt called stating she has not heard from anyone about getting scheduled for MRI lumbar spine, I checked referral and nothing was placed so I went into pt chart to last ov notes and there was mention of getting MRI lumbar spine so I placed the order and contacted pt back to let her know and that someone from the imaging will be contact ing her to schedule the appt.

## 2019-03-05 ENCOUNTER — Other Ambulatory Visit: Payer: Self-pay

## 2019-03-05 ENCOUNTER — Ambulatory Visit
Admission: RE | Admit: 2019-03-05 | Discharge: 2019-03-05 | Disposition: A | Discharge status: Home / Self Care | Payer: Medicare Other | Source: Ambulatory Visit | Attending: Orthopaedic Surgery | Admitting: Orthopaedic Surgery

## 2019-03-05 DIAGNOSIS — M4807 Spinal stenosis, lumbosacral region: Secondary | ICD-10-CM

## 2019-03-11 ENCOUNTER — Encounter: Payer: Self-pay | Admitting: Orthopaedic Surgery

## 2019-03-11 ENCOUNTER — Ambulatory Visit: Payer: Medicare Other | Admitting: Orthopaedic Surgery

## 2019-03-11 DIAGNOSIS — M5416 Radiculopathy, lumbar region: Secondary | ICD-10-CM

## 2019-03-11 NOTE — Progress Notes (Signed)
Patient: [name]           Date of Birth: 1946/04/10           MRN: [id number] Visit Date: 03/11/2019 PCP: Patient, No Pcp Per   Assessment & Plan:  Chief Complaint:  Chief Complaint  Patient presents with  . Lower Back - Follow-up   Visit Diagnoses:  1. Radiculopathy, lumbar region     Plan: Patient is a pleasant 73 year old female who presents our clinic today to discuss MRI results of her lumbar spine.  She has been dealing with left lower extremity lumbar radiculopathy for quite some time.  She has metastatic stage IV cancer so MRI of the lumbar spine was obtained to rule out metastasis.  MRI of the lumbar spine showed progressive multilevel lumbar spondylosis with moderate spinal canal stenosis at L3-4 and moderate left neuroforaminal stenosis at L5-S1.  She has had continued pain to the left lower extremity despite taking chronic hydrocortisone.  She has increased pain when sitting or lying down.  She has mild relief when walking around.  She notes previous ESI's with minimal relief of symptoms.  She is unsure at what level these were performed.  At this point, due to the patient's underlying comorbidities and current symptoms we will refer her to Dr. Ernestina Patches for possible repeat ESI.  Follow-up with [place] as needed.  Follow-Up Instructions: Return if symptoms worsen or fail to improve.   Orders:  No orders of the defined types were placed in this encounter.  No orders of the defined types were placed in this encounter.   Imaging: No new imaging  PMFS History: Patient Active Problem List   Diagnosis Date Noted  . COPD exacerbation ([place]) 06/19/2017  . Non-small cell carcinoma of lung, stage 4 ([place]) 06/19/2017  . Parkinson's disease ([place]) 06/19/2017  . Tamponade 02/28/2017  . Pericardial effusion 02/28/2017  . Chest pain 02/26/2016  . Colitis 03/02/2015  . Diabetes mellitus type 2, controlled ([place]) 03/02/2015  . Hyperlipidemia 03/02/2015  . OSA  (obstructive sleep apnea) 05/10/2012  . Essential tremor 03/19/2012  . DIVERTICULITIS OF COLON 02/03/2008  . ANEMIA, IRON DEFICIENCY 12/27/2007  . HEMORRHOIDS, INTERNAL 12/27/2007  . GERD 12/27/2007  . GASTRITIS 12/27/2007  . DIVERTICULOSIS, COLON 12/27/2007  . PUD, HX OF 12/27/2007  . COLONIC POLYPS, HX OF 12/27/2007  . Generalized anxiety disorder 04/23/2007  . Essential hypertension 04/23/2007  . ARTHRITIS 04/23/2007  . SLEEP APNEA 04/23/2007  . Type 2 diabetes mellitus with peripheral neuropathy ([place]) 04/25/1991  . Asthma 04/23/1987   Past Medical History:  Diagnosis Date  . Asthma   . Bleeding nose   . Cancer ([place]) 2017   Lung   . Diabetes mellitus   . Diverticulitis   . H. pylori infection   . Hypertension   . Lung nodule 2017   [facility] at [facility].   . Parkinson disease ([place]) 2010  . Tubular adenoma of colon 05/2007    Family History  Problem Relation Age of Onset  . Diabetes Mellitus II Sister   . Diabetes Mellitus II Son   . Colon cancer Neg Hx     Past Surgical History:  Procedure Laterality Date  . APPENDECTOMY    . CHOLECYSTECTOMY    . COLON SURGERY     colectomy and reversal after diverticular ds  . PARTIAL COLECTOMY    . PERICARDIAL WINDOW N/A 02/28/2017   Procedure: PERICARDIAL WINDOW;  Surgeon: [name], MD;  Location:  [facility] OR;  Service: Thoracic;  Laterality: N/A;  . TONSILLECTOMY    . TOTAL KNEE ARTHROPLASTY     right  . VAGINAL HYSTERECTOMY     Social History   Occupational History  . Occupation: retired    Comment: Charity fundraiser; [employer]  Tobacco Use  . Smoking status: Former Smoker    Quit date: 03/20/1987    Years since quitting: 31.9  . Smokeless tobacco: Never Used  Substance and Sexual Activity  . Alcohol use: No  . Drug use: No  . Sexual activity: Never

## 2019-03-12 ENCOUNTER — Encounter: Payer: Self-pay | Admitting: Orthopaedic Surgery

## 2019-03-12 NOTE — Addendum Note (Signed)
Addended by: Precious Bard on: 03/12/2019 09:20 AM   Modules accepted: Orders

## 2019-04-14 ENCOUNTER — Other Ambulatory Visit: Payer: Self-pay | Admitting: Cardiology

## 2019-04-14 DIAGNOSIS — Z20822 Contact with and (suspected) exposure to covid-19: Secondary | ICD-10-CM

## 2019-04-15 ENCOUNTER — Telehealth: Payer: Self-pay | Admitting: Critical Care Medicine

## 2019-04-15 LAB — NOVEL CORONAVIRUS, NAA: SARS-CoV-2, NAA: DETECTED — AB

## 2019-04-15 NOTE — Telephone Encounter (Signed)
I connected with this patient who is positive for Covid from December 21 testing event.  She became ill yesterday on the 21st.  She was exposed to her son who is an EMT who was positive on Sunday.  He had spent the weekend with her.  The patient complains of a runny nose but no other symptoms this time.  She is high risk and that she has metastatic lung cancer that is been treated but is in remission severe COPD age 73  There are no slots for this week therefore she is on first priority we could potentially treat her on the 28th however as much prefer to treat her this week earlier in the course of treatment we will follow back up with this patient in the morning

## 2019-04-16 ENCOUNTER — Other Ambulatory Visit: Payer: Self-pay | Admitting: Critical Care Medicine

## 2019-04-16 ENCOUNTER — Telehealth: Payer: Self-pay | Admitting: Critical Care Medicine

## 2019-04-16 DIAGNOSIS — E1142 Type 2 diabetes mellitus with diabetic polyneuropathy: Secondary | ICD-10-CM

## 2019-04-16 DIAGNOSIS — C3490 Malignant neoplasm of unspecified part of unspecified bronchus or lung: Secondary | ICD-10-CM

## 2019-04-16 DIAGNOSIS — I10 Essential (primary) hypertension: Secondary | ICD-10-CM

## 2019-04-16 DIAGNOSIS — U071 COVID-19: Secondary | ICD-10-CM

## 2019-04-16 DIAGNOSIS — J441 Chronic obstructive pulmonary disease with (acute) exacerbation: Secondary | ICD-10-CM

## 2019-04-16 NOTE — Progress Notes (Signed)
  I connected by phone with [name] on 04/16/2019 at 9:38 AM to discuss the potential use of an new treatment for mild to moderate COVID-19 viral infection in non-hospitalized patients.  This patient is a 73 y.o. female that meets the FDA criteria for Emergency Use Authorization of bamlanivimab or casirivimab\imdevimab.  Has a (+) direct SARS-CoV-2 viral test result  Has mild or moderate COVID-19   Is ? 72 years of age and weighs ? 40 kg  Is NOT hospitalized due to COVID-19  Is NOT requiring oxygen therapy or requiring an increase in baseline oxygen flow rate due to COVID-19  Is within 10 days of symptom onset  Has at least one of the high risk factor(s) for progression to severe COVID-19 and/or hospitalization as defined in EUA.  Specific high risk criteria : >/= 72 yo, immunosuppression, HTN Obesity BMI >35, COPD T2DM   I have spoken and communicated the following to the patient or parent/caregiver:  1. FDA has authorized the emergency use of bamlanivimab and casirivimab\imdevimab for the treatment of mild to moderate COVID-19 in adults and pediatric patients with positive results of direct SARS-CoV-2 viral testing who are 20 years of age and older weighing at least 40 kg, and who are at high risk for progressing to severe COVID-19 and/or hospitalization.  2. The significant known and potential risks and benefits of bamlanivimab and casirivimab\imdevimab, and the extent to which such potential risks and benefits are unknown.  3. Information on available alternative treatments and the risks and benefits of those alternatives, including clinical trials.  4. Patients treated with bamlanivimab and casirivimab\imdevimab should continue to self-isolate and use infection control measures (e.g., wear mask, isolate, social distance, avoid sharing personal items, clean and disinfect "high touch" surfaces, and frequent handwashing) according to CDC guidelines.   5. The patient or  parent/caregiver has the option to accept or refuse bamlanivimab or casirivimab\imdevimab .  After reviewing this information with the patient, The patient agreed to proceed with receiving the bamlanimivab infusion and will be provided a copy of the Fact sheet prior to receiving the infusion.Asencion Noble 04/16/2019 9:38 AM

## 2019-04-16 NOTE — Telephone Encounter (Signed)
I connected with this Covid + patient again with multiple risk factors and she is < 7days from symptom onset and agrees to monoclonal antibody infusion  Called to discuss with patient about Covid symptoms and the use of bamlanivimab, a monoclonal antibody infusion for those with mild to moderate Covid symptoms and at a high risk of hospitalization.  Pt is qualified for this infusion at the [facility] infusion center due to Age > 61, Age >55, Diabetes, Hypertension, BMI>35, immuno-compromised and COPD

## 2019-04-16 NOTE — Telephone Encounter (Signed)
-----   Message from Denman George, RN sent at 04/15/2019  4:12 PM EST ----- Forwarding to Dr. Joya Gaskins for his review.

## 2019-04-17 ENCOUNTER — Ambulatory Visit (HOSPITAL_COMMUNITY)
Admission: RE | Admit: 2019-04-17 | Discharge: 2019-04-17 | Disposition: A | Discharge status: Home / Self Care | Payer: Medicare Other | Source: Ambulatory Visit | Attending: Pulmonary Disease | Admitting: Pulmonary Disease

## 2019-04-17 DIAGNOSIS — E1142 Type 2 diabetes mellitus with diabetic polyneuropathy: Secondary | ICD-10-CM | POA: Diagnosis not present

## 2019-04-17 DIAGNOSIS — Z6841 Body Mass Index (BMI) 40.0 and over, adult: Secondary | ICD-10-CM | POA: Diagnosis not present

## 2019-04-17 DIAGNOSIS — Z23 Encounter for immunization: Secondary | ICD-10-CM | POA: Insufficient documentation

## 2019-04-17 DIAGNOSIS — C3490 Malignant neoplasm of unspecified part of unspecified bronchus or lung: Secondary | ICD-10-CM | POA: Diagnosis not present

## 2019-04-17 DIAGNOSIS — J441 Chronic obstructive pulmonary disease with (acute) exacerbation: Secondary | ICD-10-CM

## 2019-04-17 DIAGNOSIS — U071 COVID-19: Secondary | ICD-10-CM | POA: Insufficient documentation

## 2019-04-17 DIAGNOSIS — I10 Essential (primary) hypertension: Secondary | ICD-10-CM | POA: Diagnosis not present

## 2019-04-17 MED ORDER — SODIUM CHLORIDE 0.9 % IV SOLN
700.0000 mg | Freq: Once | INTRAVENOUS | Status: AC
  Administered 2019-04-17: 13:00:00 700 mg via INTRAVENOUS
  Filled 2019-04-17: qty 20

## 2019-04-17 MED ORDER — ALBUTEROL SULFATE HFA 108 (90 BASE) MCG/ACT IN AERS: 2.0000 | INHALATION_SPRAY | Freq: Once | RESPIRATORY_TRACT | Status: DC | PRN

## 2019-04-17 MED ORDER — FAMOTIDINE IN NACL 20-0.9 MG/50ML-% IV SOLN: 20.0000 mg | Freq: Once | INTRAVENOUS | Status: DC | PRN

## 2019-04-17 MED ORDER — METHYLPREDNISOLONE SODIUM SUCC 125 MG IJ SOLR: 125.0000 mg | Freq: Once | INTRAMUSCULAR | Status: DC | PRN

## 2019-04-17 MED ORDER — SODIUM CHLORIDE 0.9 % IV SOLN
INTRAVENOUS | Status: DC | PRN
  Administered 2019-04-17: 13:00:00 250 mL via INTRAVENOUS

## 2019-04-17 MED ORDER — DIPHENHYDRAMINE HCL 50 MG/ML IJ SOLN: 50.0000 mg | Freq: Once | INTRAMUSCULAR | Status: DC | PRN

## 2019-04-17 MED ORDER — EPINEPHRINE 0.3 MG/0.3ML IJ SOAJ: 0.3000 mg | Freq: Once | INTRAMUSCULAR | Status: DC | PRN

## 2019-04-17 NOTE — Progress Notes (Signed)
  Diagnosis: COVID-19  Physician: Asencion Noble, MD  Procedure: Covid Infusion Clinic Med: bamlanivimab infusion - Provided patient with bamlanimivab fact sheet for patients, parents and caregivers prior to infusion.  Complications: No immediate complications noted.  Discharge: Discharged home   [place] 04/17/2019

## 2019-04-17 NOTE — Discharge Instructions (Signed)
COVID-19 Frequently Asked Questions °COVID-19 (coronavirus disease) is an infection that is caused by a large family of viruses. Some viruses cause illness in people and others cause illness in animals like camels, cats, and bats. In some cases, the viruses that cause illness in animals can spread to humans. °Where did the coronavirus come from? °In December 2019, [place] told the World Health Organization (WHO) of several cases of lung disease (human respiratory illness). These cases were linked to an open seafood and livestock market in the city of [place]. The link to the seafood and livestock market suggests that the virus may have spread from animals to humans. However, since that first outbreak in December, the virus has also been shown to spread from person to person. °What is the name of the disease and the virus? °Disease name °Early on, this disease was called novel coronavirus. This is because scientists determined that the disease was caused by a new (novel) respiratory virus. The World Health Organization (WHO) has now named the disease COVID-19, or coronavirus disease. °Virus name °The virus that causes the disease is called severe acute respiratory syndrome coronavirus 2 (SARS-CoV-2). °More information on disease and virus naming °World Health Organization (WHO): www.who.int/emergencies/diseases/novel-coronavirus-2019/technical-guidance/naming-the-coronavirus-disease-(covid-2019)-and-the-virus-that-causes-it °Who is at risk for complications from coronavirus disease? °Some people may be at higher risk for complications from coronavirus disease. This includes older adults and people who have chronic diseases, such as heart disease, diabetes, and lung disease. °If you are at higher risk for complications, take these extra precautions: °· Avoid close contact with people who are sick or have a fever or cough. Stay at least 3-6 ft (1-2 m) away from them, if possible. °· Wash your hands often with soap and  water for at least 20 seconds. °· Avoid touching your face, mouth, nose, or eyes. °· Keep supplies on hand at home, such as food, medicine, and cleaning supplies. °· Stay home as much as possible. °· Avoid social gatherings and travel. °How does coronavirus disease spread? °The virus that causes coronavirus disease spreads easily from person to person (is contagious). There are also cases of community-spread disease. This means the disease has spread to: °· People who have no known contact with other infected people. °· People who have not traveled to areas where there are known cases. °It appears to spread from one person to another through droplets from coughing or sneezing. °Can I get the virus from touching surfaces or objects? °There is still a lot that we do not know about the virus that causes coronavirus disease. Scientists are basing a lot of information on what they know about similar viruses, such as: °· Viruses cannot generally survive on surfaces for long. They need a human body (host) to survive. °· It is more likely that the virus is spread by close contact with people who are sick (direct contact), such as through: °? Shaking hands or hugging. °? Breathing in respiratory droplets that travel through the air. This can happen when an infected person coughs or sneezes on or near other people. °· It is less likely that the virus is spread when a person touches a surface or object that has the virus on it (indirect contact). The virus may be able to enter the body if the person touches a surface or object and then touches his or her face, eyes, nose, or mouth. °Can a person spread the virus without having symptoms of the disease? °It may be possible for the virus to spread before a person   has symptoms of the disease, but this is most likely not the main way the virus is spreading. It is more likely for the virus to spread by being in close contact with people who are sick and breathing in the respiratory  droplets of a sick person's cough or sneeze. °What are the symptoms of coronavirus disease? °Symptoms vary from person to person and can range from mild to severe. Symptoms may include: °· Fever. °· Cough. °· Tiredness, weakness, or fatigue. °· Fast breathing or feeling short of breath. °These symptoms can appear anywhere from 2 to 14 days after you have been exposed to the virus. If you develop symptoms, call your health care provider. People with severe symptoms may need hospital care. °If I am exposed to the virus, how long does it take before symptoms start? °Symptoms of coronavirus disease may appear anywhere from 2 to 14 days after a person has been exposed to the virus. If you develop symptoms, call your health care provider. °Should I be tested for this virus? °Your health care provider will decide whether to test you based on your symptoms, history of exposure, and your risk factors. °How does a health care provider test for this virus? °Health care providers will collect samples to send for testing. Samples may include: °· Taking a swab of fluid from the nose. °· Taking fluid from the lungs by having you cough up mucus (sputum) into a sterile cup. °· Taking a blood sample. °· Taking a stool or urine sample. °Is there a treatment or vaccine for this virus? °Currently, there is no vaccine to prevent coronavirus disease. Also, there are no medicines like antibiotics or antivirals to treat the virus. A person who becomes sick is given supportive care, which means rest and fluids. A person may also relieve his or her symptoms by using over-the-counter medicines that treat sneezing, coughing, and runny nose. These are the same medicines that a person takes for the common cold. °If you develop symptoms, call your health care provider. People with severe symptoms may need hospital care. °What can I do to protect myself and my family from this virus? ° °  ° °You can protect yourself and your family by taking the  same actions that you would take to prevent the spread of other viruses. Take the following actions: °· Wash your hands often with soap and water for at least 20 seconds. If soap and water are not available, use alcohol-based hand sanitizer. °· Avoid touching your face, mouth, nose, or eyes. °· Cough or sneeze into a tissue, sleeve, or elbow. Do not cough or sneeze into your hand or the air. °? If you cough or sneeze into a tissue, throw it away immediately and wash your hands. °· Disinfect objects and surfaces that you frequently touch every day. °· Avoid close contact with people who are sick or have a fever or cough. Stay at least 3-6 ft (1-2 m) away from them, if possible. °· Stay home if you are sick, except to get medical care. Call your health care provider before you get medical care. °· Make sure your vaccines are up to date. Ask your health care provider what vaccines you need. °What should I do if I need to travel? °Follow travel recommendations from your local health authority, the CDC, and WHO. °Travel information and advice °· Centers for Disease Control and Prevention (CDC): www.cdc.gov/coronavirus/2019-ncov/travelers/index.html °· World Health Organization (WHO): www.who.int/emergencies/diseases/novel-coronavirus-2019/travel-advice °Know the risks and take action to protect your health °·   You are at higher risk of getting coronavirus disease if you are traveling to areas with an outbreak or if you are exposed to travelers from areas with an outbreak. °· Wash your hands often and practice good hygiene to lower the risk of catching or spreading the virus. °What should I do if I am sick? °General instructions to stop the spread of infection °· Wash your hands often with soap and water for at least 20 seconds. If soap and water are not available, use alcohol-based hand sanitizer. °· Cough or sneeze into a tissue, sleeve, or elbow. Do not cough or sneeze into your hand or the air. °· If you cough or  sneeze into a tissue, throw it away immediately and wash your hands. °· Stay home unless you must get medical care. Call your health care provider or local health authority before you get medical care. °· Avoid public areas. Do not take public transportation, if possible. °· If you can, wear a mask if you must go out of the house or if you are in close contact with someone who is not sick. °Keep your home clean °· Disinfect objects and surfaces that are frequently touched every day. This may include: °? Counters and tables. °? Doorknobs and light switches. °? Sinks and faucets. °? Electronics such as phones, remote controls, keyboards, computers, and tablets. °· Wash dishes in hot, soapy water or use a dishwasher. Air-dry your dishes. °· Wash laundry in hot water. °Prevent infecting other household members °· Let healthy household members care for children and pets, if possible. If you have to care for children or pets, wash your hands often and wear a mask. °· Sleep in a different bedroom or bed, if possible. °· Do not share personal items, such as razors, toothbrushes, deodorant, combs, brushes, towels, and washcloths. °Where to find more information °Centers for Disease Control and Prevention (CDC) °· Information and news updates: www.cdc.gov/coronavirus/2019-ncov °World Health Organization (WHO) °· Information and news updates: www.who.int/emergencies/diseases/novel-coronavirus-2019 °· Coronavirus health topic: www.who.int/health-topics/coronavirus °· Questions and answers on COVID-19: www.who.int/news-room/q-a-detail/q-a-coronaviruses °· Global tracker: who.sprinklr.com °American Academy of Pediatrics (AAP) °· Information for families: www.healthychildren.org/English/health-issues/conditions/chest-lungs/Pages/2019-Novel-Coronavirus.aspx °The coronavirus situation is changing rapidly. Check your local health authority website or the CDC and WHO websites for updates and news. °When should I contact a health care  provider? °· Contact your health care provider if you have symptoms of an infection, such as fever or cough, and you: °? Have been near anyone who is known to have coronavirus disease. °? Have come into contact with a person who is suspected to have coronavirus disease. °? Have traveled outside of the country. °When should I get emergency medical care? °· Get help right away by calling your local emergency services (911 in the [place] if you have: °? Trouble breathing. °? Pain or pressure in your chest. °? Confusion. °? Blue-tinged lips and fingernails. °? Difficulty waking from sleep. °? Symptoms that get worse. °Let the emergency medical personnel know if you think you have coronavirus disease. °Summary °· A new respiratory virus is spreading from person to person and causing COVID-19 (coronavirus disease). °· The virus that causes COVID-19 appears to spread easily. It spreads from one person to another through droplets from coughing or sneezing. °· Older adults and those with chronic diseases are at higher risk of disease. If you are at higher risk for complications, take extra precautions. °· There is currently no vaccine to prevent coronavirus disease. There are no medicines, such as antibiotics or   antivirals, to treat the virus. °· You can protect yourself and your family by washing your hands often, avoiding touching your face, and covering your coughs and sneezes. °This information is not intended to replace advice given to you by your health care provider. Make sure you discuss any questions you have with your health care provider. °Document Released: 08/06/2018 Document Revised: 08/06/2018 Document Reviewed: 08/06/2018 °Elsevier Patient Education © 2020 Elsevier Inc. ° °

## 2019-07-21 ENCOUNTER — Encounter: Payer: Self-pay | Admitting: Family Medicine

## 2019-07-21 ENCOUNTER — Other Ambulatory Visit: Payer: Self-pay

## 2019-07-21 ENCOUNTER — Ambulatory Visit (INDEPENDENT_AMBULATORY_CARE_PROVIDER_SITE_OTHER): Payer: Medicare Other | Admitting: Family Medicine

## 2019-07-21 ENCOUNTER — Ambulatory Visit: Payer: Self-pay

## 2019-07-21 DIAGNOSIS — M79605 Pain in left leg: Secondary | ICD-10-CM | POA: Diagnosis not present

## 2019-07-21 MED ORDER — COLCHICINE 0.6 MG PO CAPS: 1.0000 | ORAL_CAPSULE | Freq: Two times a day (BID) | ORAL | 3 refills | Status: DC | PRN

## 2019-07-21 NOTE — Progress Notes (Signed)
Office Visit Note   Patient: [name]           Date of Birth: 12/19/1945           MRN: [id number] Visit Date: 07/21/2019 Requested by: No referring provider defined for this encounter. PCP: Patient, No Pcp Per  Subjective: Chief Complaint  Patient presents with  . Left Leg - Pain    Pain from above the knee, into medial knee and down the leg around to the Achilles. The toes "feel like they draw" sometimes. Symptoms  X 3 weeks. NKI. Has Stage IV lung cancer w/mets to bones.    HPI: She is here with left leg pain.  Symptoms started about 3 weeks ago, no injury.  She has a history of stage IV lung cancer managed by [facility].  Denies fevers or chills.  She has pain primarily on the medial side of her left thigh and also along the tibia.  She has a history of osteoarthritis in her left knee, also history of lumbar stenosis.  She has had epidural injections which have not given her much relief.              ROS:   All other systems were reviewed and are negative.  Objective: Vital Signs: There were no vitals taken for this visit.  Physical Exam:  General:  Alert and oriented, in no acute distress. Pulm:  Breathing unlabored. Psy:  Normal mood, congruent affect. Skin: No rash. Left leg: She has no significant pain with internal hip rotation.  She is tender near the abductor muscle on the medial side of her knee.  She has some medial joint line tenderness but no palpable click with McMurray's, no significant joint effusion.  She is diffusely tender on along the medial tibia.  Imaging: X-rays left femur: No sign of stress fracture or neoplasm.  There is chondrocalcinosis in the tibiofemoral joint.  X-rays left tibia/fibula: No sign of stress fracture or neoplasm.    Assessment & Plan: 1.  Left leg pain, suspect radicular pain from her lumbar stenosis.  Cannot rule out pseudogout. -Discussed options with her and elected to try colchicine.  If no improvement after  few days, she will contact me and I will refer her to Dr. Ernestina Patches for epidural injections.  If still no relief, then possibly order a PET scan.     Procedures: No procedures performed  No notes on file     PMFS History: Patient Active Problem List   Diagnosis Date Noted  . COPD exacerbation ([place]) 06/19/2017  . Non-small cell carcinoma of lung, stage 4 ([place]) 06/19/2017  . Parkinson's disease ([place]) 06/19/2017  . Tamponade 02/28/2017  . Pericardial effusion 02/28/2017  . Chest pain 02/26/2016  . Colitis 03/02/2015  . Diabetes mellitus type 2, controlled ([place]) 03/02/2015  . Hyperlipidemia 03/02/2015  . OSA (obstructive sleep apnea) 05/10/2012  . Essential tremor 03/19/2012  . DIVERTICULITIS OF COLON 02/03/2008  . ANEMIA, IRON DEFICIENCY 12/27/2007  . HEMORRHOIDS, INTERNAL 12/27/2007  . GERD 12/27/2007  . GASTRITIS 12/27/2007  . DIVERTICULOSIS, COLON 12/27/2007  . PUD, HX OF 12/27/2007  . COLONIC POLYPS, HX OF 12/27/2007  . Generalized anxiety disorder 04/23/2007  . Essential hypertension 04/23/2007  . ARTHRITIS 04/23/2007  . SLEEP APNEA 04/23/2007  . Type 2 diabetes mellitus with peripheral neuropathy ([place]) 04/25/1991  . Asthma 04/23/1987   Past Medical History:  Diagnosis Date  . Asthma   . Bleeding nose   . Cancer [facility]) 2017  Lung   . Diabetes mellitus   . Diverticulitis   . H. pylori infection   . Hypertension   . Lung nodule 2017   Both lungs-pt at [facility].   . Parkinson disease ([place]) 2010  . Tubular adenoma of colon 05/2007    Family History  Problem Relation Age of Onset  . Diabetes Mellitus II Sister   . Diabetes Mellitus II Son   . Colon cancer Neg Hx     Past Surgical History:  Procedure Laterality Date  . APPENDECTOMY    . CHOLECYSTECTOMY    . COLON SURGERY     colectomy and reversal after diverticular ds  . PARTIAL COLECTOMY    . PERICARDIAL WINDOW N/A 02/28/2017   Procedure: PERICARDIAL WINDOW;  Surgeon: [name], MD;  Location: [facility] OR;   Service: Thoracic;  Laterality: N/A;  . TONSILLECTOMY    . TOTAL KNEE ARTHROPLASTY     right  . VAGINAL HYSTERECTOMY     Social History   Occupational History  . Occupation: retired    Comment: Charity fundraiser; [employer]  Tobacco Use  . Smoking status: Former Smoker    Quit date: 03/20/1987    Years since quitting: 32.3  . Smokeless tobacco: Never Used  Substance and Sexual Activity  . Alcohol use: No  . Drug use: No  . Sexual activity: Never

## 2019-09-15 ENCOUNTER — Telehealth: Payer: Self-pay | Admitting: Family Medicine

## 2019-09-15 NOTE — Telephone Encounter (Signed)
Called patient left message on voicemail to return call to schedule an appointment with Dr Junius Roads for pain in left thigh and leg  Per mychart message

## 2019-09-30 ENCOUNTER — Other Ambulatory Visit: Payer: Self-pay

## 2019-09-30 ENCOUNTER — Ambulatory Visit (INDEPENDENT_AMBULATORY_CARE_PROVIDER_SITE_OTHER): Payer: Medicare Other | Admitting: Family Medicine

## 2019-09-30 ENCOUNTER — Encounter: Payer: Self-pay | Admitting: Family Medicine

## 2019-09-30 DIAGNOSIS — M79605 Pain in left leg: Secondary | ICD-10-CM | POA: Diagnosis not present

## 2019-09-30 DIAGNOSIS — M7989 Other specified soft tissue disorders: Secondary | ICD-10-CM

## 2019-09-30 NOTE — Progress Notes (Signed)
I saw and examined the patient with Dr. Mayer Masker and agree with assessment and plan as outlined.    Ongoing left leg pain and swelling, asymmetric.  Worst pain around knee, but hurts all the way up to the groin.  Exam reveals no pain with passive hip IR.  1+ knee effusion.  Tender in back of calf.  2 cm larger left calf than right.    Will order left LE dopplers.  If negative, then possibly left leg bone scan vs. MRI.

## 2019-09-30 NOTE — Progress Notes (Signed)
Office Visit Note   Patient: [name]           Date of Birth: 08-21-1945           MRN: [id number] Visit Date: 09/30/2019 Requested by: No referring provider defined for this encounter. PCP: Patient, No Pcp Per  Subjective: Chief Complaint  Patient presents with  . Left Leg - Pain    Still swells and bothers her, can't sleep at night, pain is up to the groin now.  Gout meds did has not helped her. States that she fell in her bathroom over tub last week.    HPI: She is here for follow up of left leg pain and swelling, starting 3.5 months ago without an inciting injury. Pain has not improved despite pain medications and gout treatment. She feels left calf cramping. Pain is worse over anterior knee and around the knee in general, but pain has now moved up the leg to the groin. She fell in her bathroom last week due to instability from swelling.   Significant PMHx: She has a history of stage IV lung cancer managed by [facility].  She has a history of osteoarthritis in her left knee, also history of lumbar stenosis.  She has had epidural injections which have not given her much relief.              ROS:   All other systems were reviewed and are negative.  Objective: Vital Signs: There were no vitals taken for this visit.  Physical Exam:  General:  Alert and oriented, in no acute distress. Pulm:  Breathing unlabored. Psy:  Normal mood, congruent affect. Skin: No rash. Left leg: Diffuse swelling in left leg from ankle to mid thigh. 1+ edema (similar to other leg)   TTP over medial joint of knee as well as anterior knee, medial tibia No pain with passive hip IR DP pulses intact   Assessment & Plan: Diffuse swelling of left leg for the past several months. X-rays of left tib/fib and femur obtained at last visit in March were normal. Pain and swelling have persisted despite various medications. Given worsening of swelling, will obtain dopplers of left lower extremity.  If its negative, will consider bone scan or further imaging to see if there is an occlusion of vessels. She reports that has had screening CT scan of abdomen/pelvis a few months ago due to her cancer diagnosis; she reports that was normal.    Procedures: No procedures performed  No notes on file     PMFS History: Patient Active Problem List   Diagnosis Date Noted  . COPD exacerbation ([place]) 06/19/2017  . Non-small cell carcinoma of lung, stage 4 ([place]) 06/19/2017  . Parkinson's disease ([place]) 06/19/2017  . Tamponade 02/28/2017  . Pericardial effusion 02/28/2017  . Chest pain 02/26/2016  . Colitis 03/02/2015  . Diabetes mellitus type 2, controlled ([place]) 03/02/2015  . Hyperlipidemia 03/02/2015  . OSA (obstructive sleep apnea) 05/10/2012  . Essential tremor 03/19/2012  . DIVERTICULITIS OF COLON 02/03/2008  . ANEMIA, IRON DEFICIENCY 12/27/2007  . HEMORRHOIDS, INTERNAL 12/27/2007  . GERD 12/27/2007  . GASTRITIS 12/27/2007  . DIVERTICULOSIS, COLON 12/27/2007  . PUD, HX OF 12/27/2007  . COLONIC POLYPS, HX OF 12/27/2007  . Generalized anxiety disorder 04/23/2007  . Essential hypertension 04/23/2007  . ARTHRITIS 04/23/2007  . SLEEP APNEA 04/23/2007  . Type 2 diabetes mellitus with peripheral neuropathy ([place]) 04/25/1991  . Asthma 04/23/1987   Past Medical History:  Diagnosis Date  .  Asthma   . Bleeding nose   . Cancer ([place]) 2017   Lung   . Diabetes mellitus   . Diverticulitis   . H. pylori infection   . Hypertension   . Lung nodule 2017   Both lungs-pt at [facility].   . Parkinson disease ([place]) 2010  . Tubular adenoma of colon 05/2007    Family History  Problem Relation Age of Onset  . Diabetes Mellitus II Sister   . Diabetes Mellitus II Son   . Colon cancer Neg Hx     Past Surgical History:  Procedure Laterality Date  . APPENDECTOMY    . CHOLECYSTECTOMY    . COLON SURGERY     colectomy and reversal after diverticular ds  . PARTIAL COLECTOMY    . PERICARDIAL WINDOW N/A  02/28/2017   Procedure: PERICARDIAL WINDOW;  Surgeon: [name], MD;  Location: [facility] OR;  Service: Thoracic;  Laterality: N/A;  . TONSILLECTOMY    . TOTAL KNEE ARTHROPLASTY     right  . VAGINAL HYSTERECTOMY     Social History   Occupational History  . Occupation: retired    Comment: Charity fundraiser; [employer]  Tobacco Use  . Smoking status: Former Smoker    Quit date: 03/20/1987    Years since quitting: 32.5  . Smokeless tobacco: Never Used  Substance and Sexual Activity  . Alcohol use: No  . Drug use: No  . Sexual activity: Never

## 2019-10-06 ENCOUNTER — Telehealth: Payer: Self-pay | Admitting: Family Medicine

## 2019-10-06 ENCOUNTER — Other Ambulatory Visit: Payer: Self-pay

## 2019-10-06 ENCOUNTER — Ambulatory Visit (HOSPITAL_COMMUNITY)
Admission: RE | Admit: 2019-10-06 | Discharge: 2019-10-06 | Disposition: A | Discharge status: Home / Self Care | Payer: Medicare Other | Source: Ambulatory Visit | Attending: Family Medicine | Admitting: Family Medicine

## 2019-10-06 DIAGNOSIS — M79605 Pain in left leg: Secondary | ICD-10-CM

## 2019-10-06 DIAGNOSIS — M7989 Other specified soft tissue disorders: Secondary | ICD-10-CM | POA: Diagnosis not present

## 2019-10-06 NOTE — Telephone Encounter (Signed)
Doppler test was negative for blood clot.  I will order a bone scan test of the leg to further evaluate.

## 2019-10-21 ENCOUNTER — Other Ambulatory Visit: Payer: Self-pay

## 2019-10-21 ENCOUNTER — Encounter (HOSPITAL_COMMUNITY)
Admission: RE | Admit: 2019-10-21 | Discharge: 2019-10-21 | Disposition: A | Discharge status: Home / Self Care | Payer: Medicare Other | Source: Ambulatory Visit | Attending: Family Medicine | Admitting: Family Medicine

## 2019-10-21 ENCOUNTER — Telehealth: Payer: Self-pay | Admitting: Family Medicine

## 2019-10-21 DIAGNOSIS — M79605 Pain in left leg: Secondary | ICD-10-CM

## 2019-10-21 MED ORDER — TECHNETIUM TC 99M MEDRONATE IV KIT
20.0000 | PACK | Freq: Once | INTRAVENOUS | Status: AC | PRN
  Administered 2019-10-21: 20 via INTRAVENOUS

## 2019-10-21 NOTE — Telephone Encounter (Signed)
Your order says 3-phase bone scan - I guess they just want to be sure.

## 2019-10-21 NOTE — Telephone Encounter (Signed)
I tried this number - incorrect. The 3-phase bone scan is already in progress.

## 2019-10-21 NOTE — Telephone Encounter (Signed)
I meant to order 3-phase bone scan of the extremity (leg).

## 2019-10-21 NOTE — Telephone Encounter (Signed)
Waunita Schooner with [facility] called wanting to know if the pt was supposed to have a 3 phase bone scan or a full body? Waunita Schooner would like a CB with this order.   Waunita Schooner [phone]

## 2019-10-22 ENCOUNTER — Encounter: Payer: Self-pay | Admitting: Family Medicine

## 2019-10-22 DIAGNOSIS — M79605 Pain in left leg: Secondary | ICD-10-CM

## 2019-11-20 ENCOUNTER — Other Ambulatory Visit: Payer: Self-pay

## 2019-11-20 ENCOUNTER — Emergency Department (HOSPITAL_COMMUNITY): Payer: Medicare Other

## 2019-11-20 ENCOUNTER — Encounter (HOSPITAL_COMMUNITY): Payer: Self-pay | Admitting: Emergency Medicine

## 2019-11-20 ENCOUNTER — Emergency Department (HOSPITAL_COMMUNITY)
Admission: EM | Admit: 2019-11-20 | Discharge: 2019-11-21 | Disposition: A | Discharge status: Home / Self Care | Payer: Medicare Other | Attending: Emergency Medicine | Admitting: Emergency Medicine

## 2019-11-20 DIAGNOSIS — Z79899 Other long term (current) drug therapy: Secondary | ICD-10-CM | POA: Insufficient documentation

## 2019-11-20 DIAGNOSIS — J449 Chronic obstructive pulmonary disease, unspecified: Secondary | ICD-10-CM | POA: Diagnosis not present

## 2019-11-20 DIAGNOSIS — D0220 Carcinoma in situ of unspecified bronchus and lung: Secondary | ICD-10-CM | POA: Diagnosis not present

## 2019-11-20 DIAGNOSIS — M5127 Other intervertebral disc displacement, lumbosacral region: Secondary | ICD-10-CM | POA: Diagnosis not present

## 2019-11-20 DIAGNOSIS — Z794 Long term (current) use of insulin: Secondary | ICD-10-CM | POA: Diagnosis not present

## 2019-11-20 DIAGNOSIS — I10 Essential (primary) hypertension: Secondary | ICD-10-CM | POA: Insufficient documentation

## 2019-11-20 DIAGNOSIS — E119 Type 2 diabetes mellitus without complications: Secondary | ICD-10-CM | POA: Diagnosis not present

## 2019-11-20 DIAGNOSIS — M79605 Pain in left leg: Secondary | ICD-10-CM | POA: Diagnosis present

## 2019-11-20 LAB — BASIC METABOLIC PANEL
Anion gap: 12 (ref 5–15)
BUN: 22 mg/dL (ref 8–23)
CO2: 21 mmol/L — ABNORMAL LOW (ref 22–32)
Calcium: 8.7 mg/dL — ABNORMAL LOW (ref 8.9–10.3)
Chloride: 104 mmol/L (ref 98–111)
Creatinine, Ser: 1.07 mg/dL — ABNORMAL HIGH (ref 0.44–1.00)
GFR calc Af Amer: >60 mL/min (ref >60)
GFR calc non Af Amer: 52 mL/min — ABNORMAL LOW (ref >60)
Glucose, Bld: 345 mg/dL — ABNORMAL HIGH (ref 70–99)
Potassium: 5.2 mmol/L — ABNORMAL HIGH (ref 3.5–5.1)
Sodium: 137 mmol/L (ref 135–145)

## 2019-11-20 LAB — CBC WITH DIFFERENTIAL/PLATELET
Abs Immature Granulocytes: 0.03 10*3/uL (ref 0.00–0.07)
Basophils Absolute: 0 10*3/uL (ref 0.0–0.1)
Basophils Relative: 1 %
Eosinophils Absolute: 0.2 10*3/uL (ref 0.0–0.5)
Eosinophils Relative: 4 %
HCT: 36 % (ref 36.0–46.0)
Hemoglobin: 11.4 g/dL — ABNORMAL LOW (ref 12.0–15.0)
Immature Granulocytes: 1 %
Lymphocytes Relative: 17 %
Lymphs Abs: 1 10*3/uL (ref 0.7–4.0)
MCH: 27.9 pg (ref 26.0–34.0)
MCHC: 31.7 g/dL (ref 30.0–36.0)
MCV: 88.2 fL (ref 80.0–100.0)
Monocytes Absolute: 0.3 10*3/uL (ref 0.1–1.0)
Monocytes Relative: 6 %
Neutro Abs: 4.4 10*3/uL (ref 1.7–7.7)
Neutrophils Relative %: 71 %
Platelets: 213 10*3/uL (ref 150–400)
RBC: 4.08 MIL/uL (ref 3.87–5.11)
RDW: 14.1 % (ref 11.5–15.5)
WBC: 6.1 10*3/uL (ref 4.0–10.5)
nRBC: 0 % (ref 0.0–0.2)

## 2019-11-20 MED ORDER — DEXAMETHASONE SODIUM PHOSPHATE 4 MG/ML IJ SOLN
4.0000 mg | Freq: Once | INTRAMUSCULAR | Status: AC
  Administered 2019-11-20: 4 mg via INTRAVENOUS
  Filled 2019-11-20: qty 1

## 2019-11-20 MED ORDER — KETOROLAC TROMETHAMINE 30 MG/ML IJ SOLN
30.0000 mg | Freq: Once | INTRAMUSCULAR | Status: AC
  Administered 2019-11-20: 30 mg via INTRAVENOUS
  Filled 2019-11-20: qty 1

## 2019-11-20 MED ORDER — DIAZEPAM 5 MG/ML IJ SOLN
5.0000 mg | Freq: Once | INTRAMUSCULAR | Status: AC
  Administered 2019-11-20: 5 mg via INTRAVENOUS
  Filled 2019-11-20: qty 2

## 2019-11-20 MED ORDER — HYDROMORPHONE HCL 1 MG/ML IJ SOLN
1.0000 mg | Freq: Once | INTRAMUSCULAR | Status: AC
  Administered 2019-11-20: 1 mg via INTRAVENOUS
  Filled 2019-11-20: qty 1

## 2019-11-20 NOTE — ED Notes (Signed)
Patient transported to MRI 

## 2019-11-20 NOTE — Discharge Instructions (Addendum)
You were evaluated in the emergency department for left hip and leg pain. The MRI of your lumbar spine showed irritation of a nerve in your lower back from a bulging disc. This very likely explains the increased pain you have been experiencing. We treated your pain with several medications. We recommend that you contact your palliative care doctor in the next business day to discuss possibly modifying your current pain regimen. You should also contact your primary care physician to let them know you were seen in the emergency department, and they can help you decide when you should be seen next.

## 2019-11-20 NOTE — ED Provider Notes (Signed)
[facility] EMERGENCY DEPARTMENT Provider Note   CSN: [id number] Arrival date & time: 11/20/19  1909     History Chief Complaint  Patient presents with  . Leg Pain    [name] is a 74 y.o. woman with Stage IV non-small cell carcinoma of the lung with bony metastases (treated at [facility]), COPD, and insulin-dependent DM who presents with acute on chronic left hip and leg pain.  The patient reports she has had left hip and leg pain for the last 6 months, however 6 days ago last Friday (11/14/19) she states the pain worsened and has been steadily worsening since. Then last night she slipped on something in her kitchen but caught herself but "twisted that side" such that she can't sleep with the pain. She states the pain starts in her left buttock and radiates down her hip, into her thigh, her knee, ankle, all the way to the bottom of her left foot. She has been able to ambulate independently until yesterday evening when she slipped. She endorses swelling on the left medial calf.  She denies fever/chills, cough or cold symptoms, loss of bowel or bladder function, recent trauma, numbness or tingling, chest pain, shortness of breath, abdominal pain, dysuria or other urinary complaints, N/V/D.  She reports she has undergone evaluation for this pain previously. On chart review, she had a MRI of her lumbar spine wo contrast in 02/2019 which demonstrated progressive multilevel lumbar spondylosis and moderate spinal canal stenosis at L3-L4 and moderate left neuroforaminal stenosis at L5-S1. She reportedly has had epidural injections without much relief. She was seen by ortho in March 2021 who suspected left leg pain secondary to radicular pain from her lumbar stenosis however attempted colchicine since pseudogout could not be ruled out. Xrays of her left tib/fib and femur obtained at that visit were normal. LLE doppler was obtained at that time and were negative. Nuclear medicine bone  scan of her left lower extremity performed on 10/21/19 were negative.  She was diagnosed with Stage IV lung cancer with metastases to the spin and R hip in 2018. She wears 125 mcg of fentanyl patches which she switches every 3 days. She also takes 30 mg oxy as needed and on averages takes 4-5 oxy per day. Has taken 3 oxy today.     Past Medical History:  Diagnosis Date  . Asthma   . Bleeding nose   . Cancer ([place]) 2017   Lung   . Diabetes mellitus   . Diverticulitis   . H. pylori infection   . Hypertension   . Lung nodule 2017   Both lungs-pt at [facility]   . Parkinson disease ([place]) 2010  . Tubular adenoma of colon 05/2007    Patient Active Problem List   Diagnosis Date Noted  . COPD exacerbation ([place]) 06/19/2017  . Non-small cell carcinoma of lung, stage 4 ([place]) 06/19/2017  . Parkinson's disease ([place]) 06/19/2017  . Tamponade 02/28/2017  . Pericardial effusion 02/28/2017  . Chest pain 02/26/2016  . Colitis 03/02/2015  . Diabetes mellitus type 2, controlled ([place]) 03/02/2015  . Hyperlipidemia 03/02/2015  . OSA (obstructive sleep apnea) 05/10/2012  . Essential tremor 03/19/2012  . DIVERTICULITIS OF COLON 02/03/2008  . ANEMIA, IRON DEFICIENCY 12/27/2007  . HEMORRHOIDS, INTERNAL 12/27/2007  . GERD 12/27/2007  . GASTRITIS 12/27/2007  . DIVERTICULOSIS, COLON 12/27/2007  . PUD, HX OF 12/27/2007  . COLONIC POLYPS, HX OF 12/27/2007  . Generalized anxiety disorder 04/23/2007  . Essential hypertension 04/23/2007  .  ARTHRITIS 04/23/2007  . SLEEP APNEA 04/23/2007  . Type 2 diabetes mellitus with peripheral neuropathy ([place]) 04/25/1991  . Asthma 04/23/1987    Past Surgical History:  Procedure Laterality Date  . APPENDECTOMY    . CHOLECYSTECTOMY    . COLON SURGERY     colectomy and reversal after diverticular ds  . PARTIAL COLECTOMY    . PERICARDIAL WINDOW N/A 02/28/2017   Procedure: PERICARDIAL WINDOW;  Surgeon: [name], MD;  Location: [facility] OR;  Service: Thoracic;  Laterality:  N/A;  . TONSILLECTOMY    . TOTAL KNEE ARTHROPLASTY     right  . VAGINAL HYSTERECTOMY       OB History   No obstetric history on file.     Family History  Problem Relation Age of Onset  . Diabetes Mellitus II Sister   . Diabetes Mellitus II Son   . Colon cancer Neg Hx     Social History   Tobacco Use  . Smoking status: Former Smoker    Quit date: 03/20/1987    Years since quitting: 32.6  . Smokeless tobacco: Never Used  Vaping Use  . Vaping Use: Never used  Substance Use Topics  . Alcohol use: No  . Drug use: No    Home Medications Prior to Admission medications   Medication Sig Start Date End Date Taking? Authorizing Provider  ALPRAZolam Duanne Moron) 1 MG tablet Take 1 tablet (1 mg total) 2 (two) times daily as needed by mouth for anxiety. 03/05/17   Gold, Wayne E, PA-C  BREZTRI AEROSPHERE 160-9-4.8 MCG/ACT AERO Inhale 2 puffs into the lungs 2 (two) times daily. 07/14/19   [provider]  Colchicine 0.6 MG CAPS Take 1 tablet by mouth 2 (two) times daily as needed. 07/21/19   Hilts, Legrand Como, MD  fentaNYL (DURAGESIC - DOSED MCG/HR) 100 MCG/HR Place 100 mcg every 3 (three) days onto the skin. 02/21/17   [provider]  fluticasone (FLONASE) 50 MCG/ACT nasal spray SHAKE LIQUID AND USE 2 SPRAYS IN EACH NOSTRIL EVERY DAY 12/06/17   [provider]  Fluticasone-Salmeterol (ADVAIR) 500-50 MCG/DOSE AEPB Inhale 1 puff into the lungs every 12 (twelve) hours.      [provider]  furosemide (LASIX) 40 MG tablet Take 1 tablet (40 mg total) daily by mouth. Patient taking differently: Take 40 mg by mouth daily as needed for fluid.  03/06/17   Gold, Wayne E, PA-C  HUMALOG KWIKPEN 100 UNIT/ML KiwkPen Inject 0.12 mLs (12 Units total) into the skin 3 (three) times daily before meals. Along with your normal sliding scale. 06/20/17   Lorella Nimrod, MD  hydrocortisone (CORTEF) 5 MG tablet TAKE 2TABLETS EVERY MORNING & 1 EVERY AFTERNOON. DOUBLE DOSE DURING SICK  DAYS 03/15/19   [provider]  hydrocortisone 2.5 % cream Apply 1 application topically as needed (itching).  01/31/17   [provider]  insulin glargine (LANTUS) 100 UNIT/ML injection Inject 65 Units into the skin at bedtime.     [provider]  methylphenidate (RITALIN) 10 MG tablet Take 10 mg by mouth 2 (two) times daily. 07/17/19   [provider]  omeprazole (PRILOSEC) 20 MG capsule Take 20 mg by mouth daily. 05/04/19   [provider]  omeprazole (PRILOSEC) 40 MG capsule Take 1 capsule (40 mg total) by mouth 2 (two) times daily. Patient taking differently: Take 40 mg by mouth daily as needed (heartburn).  04/20/16   Ladene Artist, MD  Oxycodone HCl 20 MG TABS Take 20-40  mg every 4 (four) hours as needed by mouth for pain. 02/21/17   [provider]  potassium chloride (KLOR-CON) 10 MEQ tablet Take 10 mEq by mouth daily. 06/10/19   [provider]  pregabalin (LYRICA) 25 MG capsule TAKE ONE CAPSULE BY MOUTH EVERY MORNING AND 2 CAPSULES BY MOUTH AT BEDTIME 07/02/19   [provider]  promethazine (PHENERGAN) 12.5 MG tablet TAKE 1 TABLET(12.5 MG) BY MOUTH EVERY 6 HOURS AS NEEDED FOR NAUSEA 09/19/18   [provider]  sertraline (ZOLOFT) 100 MG tablet Take 100 mg by mouth daily. 06/29/13   [provider]  sodium chloride (OCEAN) 0.65 % nasal spray Place into the nose. 07/26/17   [provider]  TRELEGY ELLIPTA 100-62.5-25 MCG/INH AEPB Take 1 puff by mouth daily. 07/15/19   [provider]    Allergies    Morphine and related and Tape  Review of Systems   Review of Systems  All other systems reviewed and are negative.  All systems reviewed and are negative for acute changes, except as noted in the HPI.  Physical Exam Updated Vital Signs BP (!) 139/90 (BP Location: Right Arm)   Pulse (!) 108   Temp 99.3 F (37.4 C) (Oral)   Resp 20   Ht 5\' 4"  (1.626 m)   Wt (!) 107 kg   SpO2  94%   BMI 40.49 kg/m   Physical Exam Constitutional:      Appearance: She is not ill-appearing.     Comments: Patient is lying in bed, appears very uncomfortable and slightly tremulous  HENT:     Head: Normocephalic and atraumatic.     Nose: No congestion or rhinorrhea.     Mouth/Throat:     Mouth: Mucous membranes are moist.  Eyes:     Extraocular Movements: Extraocular movements intact.  Cardiovascular:     Rate and Rhythm: Normal rate and regular rhythm.     Pulses: Normal pulses.     Heart sounds: Normal heart sounds.  Pulmonary:     Effort: Pulmonary effort is normal.     Breath sounds: Normal breath sounds.  Abdominal:     Palpations: Abdomen is soft.  Musculoskeletal:        General: Tenderness present. No swelling.     Cervical back: Normal range of motion and neck supple.     Comments: Exam is pain-limited. Tenderness to palpation of L hip, thigh, knee, ankle. Unable to complete straight-leg raise test due to pain. No apparent swelling or overlying erythema. DP and PT pulses intact.   Skin:    General: Skin is warm and dry.     Coloration: Skin is not pale.     Findings: No ecchymosis, signs of injury or rash.  Neurological:     Mental Status: She is alert and oriented to person, place, and time.     Comments: 5/5 strength in RUE, LUE, and RLE. Strength exam in the LLE limited secondary to pain. Unable to perform straight leg raise test. Sensation intact.     ED Results / Procedures / Treatments   Labs (all labs ordered are listed, but only abnormal results are displayed) Labs Reviewed  CBC WITH DIFFERENTIAL/PLATELET - Abnormal; Notable for the following components:      Result Value   Hemoglobin 11.4 (*)    All other components within normal limits  BASIC METABOLIC PANEL - Abnormal; Notable for the following components:   Potassium 5.2 (*)    CO2 21 (*)  Glucose, Bld 345 (*)    Creatinine, Ser 1.07 (*)    Calcium 8.7 (*)    GFR calc non Af Amer 52 (*)      All other components within normal limits    EKG None  Radiology MR LUMBAR SPINE WO CONTRAST  Result Date: 11/20/2019 CLINICAL DATA:  Initial evaluation for spinal stenosis with lower back pain. EXAM: MRI LUMBAR SPINE WITHOUT CONTRAST TECHNIQUE: Multiplanar, multisequence MR imaging of the lumbar spine was performed. No intravenous contrast was administered. COMPARISON:  Prior MRI from 03/05/2019. FINDINGS: Segmentation: Standard. Lowest well-formed disc space labeled the L5-S1 level. Alignment: Underlying levoscoliosis. 3 mm retrolisthesis of L1 on L2 and L2 on L3, with 3 mm anterolisthesis of L3 on L4. Additional trace 2 mm retrolisthesis of L4 on L5. Findings chronic and facet mediated. Vertebrae: Vertebral body height maintained without evidence for acute or chronic fracture. Bone marrow signal intensity diffusely heterogeneous but within normal limits. No worrisome osseous lesions. Discogenic reactive endplate changes present about the L3-4 interspace. No other abnormal marrow edema. Conus medullaris and cauda equina: Conus extends to the L1 level. Conus and cauda equina appear normal. Paraspinal and other soft tissues: Paraspinous soft tissues within normal limits. Partially visualized visceral structures grossly normal. Disc levels: T12-L1: Diffuse disc bulge with disc desiccation and intervertebral disc space narrowing. No significant canal or foraminal stenosis. L1-2: Trace retrolisthesis. Chronic intervertebral disc space narrowing with diffuse disc bulge and disc desiccation. Reactive endplate spurring. No significant spinal stenosis. Foramina remain patent. L2-3: Trace retrolisthesis. Diffuse disc bulge with disc desiccation and intervertebral disc space narrowing. Reactive endplate spurring. Mild facet and ligament flavum hypertrophy. No significant spinal stenosis. Foramina remain patent. L3-4: Anterolisthesis. Diffuse disc bulge with disc desiccation and intervertebral disc space  narrowing. Disc bulge asymmetric to the right with superimposed right foraminal to extraforaminal disc protrusion (series 5, image 22). Severe bilateral facet hypertrophy. Associated trace joint effusion on the right. Resultant moderate to severe spinal stenosis. Thecal sac measures 4 mm in AP diameter. Mild right L3 foraminal narrowing. No left foraminal encroachment. L4-5: Trace retrolisthesis. Diffuse disc bulge with disc desiccation and intervertebral disc space narrowing. Reactive endplate spurring. Moderate bilateral facet hypertrophy. Resultant mild left lateral recess stenosis, stable. Mild left greater than right L4 foraminal narrowing, also unchanged. L5-S1: Diffuse disc bulge with disc desiccation. Superimposed left foraminal disc protrusion contacts the exiting left L5 nerve root (series 4, image 11). Severe left worse than right facet degeneration. Spinal canal remains patent. Moderate left L5 foraminal stenosis, stable. IMPRESSION: 1. Overall, similar appearance of the lumbar spine as compared to 03/05/2019. No acute abnormality. 2. Stable multifactorial degenerative changes at L3-4 with resultant moderate to severe spinal stenosis. 3. Left foraminal disc protrusion at L5-S1, contacting and potentially irritating the exiting left L5 nerve root. 4. Disc bulging with facet hypertrophy at L4-5 with resultant mild left lateral recess narrowing, with mild bilateral L4 foraminal stenosis. Electronically Signed   By: Jeannine Boga M.D.   On: 11/20/2019 22:27    Medications Ordered in ED Medications  HYDROmorphone (DILAUDID) injection 1 mg (1 mg Intravenous Given 11/20/19 2108)  diazepam (VALIUM) injection 5 mg (5 mg Intravenous Given 11/20/19 2108)  dexamethasone (DECADRON) injection 4 mg (4 mg Intravenous Given 11/20/19 2108)  ketorolac (TORADOL) 30 MG/ML injection 30 mg (30 mg Intravenous Given 11/20/19 2107)    ED Course  I have reviewed the triage vital signs and the nursing  notes.  Pertinent labs & imaging results that were  available during my care of the patient were reviewed by me and considered in my medical decision making (see chart for details).    MDM Rules/Calculators/A&P                          EVELENE ROUSSIN is a 74 y.o. woman with Stage IV non-small cell carcinoma of the lung with bony metastases (treated at [facility]), COPD, and insulin-dependent DM who presents with acute on chronic left hip and leg pain.  The patient appears very uncomfortable however is non-toxic appearing. She is slightly hypertensive and tachycardic but otherwise with stable vitals. Exam is limited secondary to the patient's pain, however there is tenderness to palpation of L hip, thigh, knee, ankle. Unable to complete straight-leg raise test due to pain. No apparent swelling or overlying erythema. DP and PT pulses intact. No saddle anesthesia. Low suspicion for spinal epidural abscess or cauda equina. Patient has had significant work-up previously. MRI of her lumbar spine wo contrast in 02/2019 demonstrated progressive multilevel lumbar spondylosis and moderate spinal canal stenosis at L3-L4 and moderate left neuroforaminal stenosis at L5-S1. Xrays of her left tib/fib and femur as well as LLE doppler obtained in March 2021 were normal. LLE doppler was obtained at that time and were negative. Nuclear medicine bone scan of her left lower extremity performed on 10/21/19 were negative. However, given the acute worsening and severity of her pain, will obtain MRI of her lumbar spine wo contrast. Patient on 125 mcg fentanyl patches q3 days as well as oxy PRN, so will treat with 30 mg Toradol, 4 mg Decadron, 5 mg diazepam, and 1 mg hydromorphone and reevaluate. Will also obtain BMP and CBC.  - CBC unremarkable. BMP significant for hyperglycemia but otherwise unremarkable. - MRI of lumbar spine demonstrates left foraminal disc protrusion at L5-S1, contacting and potentially irritating the exiting  left L5 nerve root. Upon reevaluation, patient states her pain is much better controlled. - Patient was discharged home with return precautions and recommendation to follow-up with her palliative care doctor for possible modification of her pain regimen.    Final Clinical Impression(s) / ED Diagnoses Final diagnoses:  Protrusion of intervertebral disc of lumbosacral region    Rx / DC Orders ED Discharge Orders    None       Alexandria Lodge, MD 11/21/19 0030    Drenda Freeze, MD 11/23/19 2306

## 2019-11-20 NOTE — ED Triage Notes (Signed)
Patient tripped and fell 6 weeks ago reports pain at entire left leg and left buttocks , no deformity/ambulatory .

## 2019-11-26 ENCOUNTER — Ambulatory Visit: Payer: Medicare Other | Admitting: Physical Therapy

## 2019-12-31 ENCOUNTER — Telehealth: Payer: Self-pay | Admitting: Unknown Physician Specialty

## 2019-12-31 ENCOUNTER — Other Ambulatory Visit (HOSPITAL_COMMUNITY): Payer: Self-pay

## 2019-12-31 NOTE — Telephone Encounter (Signed)
Called to Discuss with patient about Covid symptoms and the use of the monoclonal antibody infusion for those with mild to moderate Covid symptoms and at a high risk of hospitalization.     Pt appears to qualify for this infusion due to co-morbid conditions and/or a member of an at-risk group in accordance with the FDA Emergency Use Authorization.    Unable to reach pt    

## 2020-01-01 ENCOUNTER — Telehealth (HOSPITAL_COMMUNITY): Payer: Self-pay | Admitting: Family

## 2020-01-01 NOTE — Telephone Encounter (Signed)
Called to discuss with [name] about Covid symptoms and the use of casirivimab/imdevimab, a combination monoclonal antibody infusion for those with mild to moderate Covid symptoms and at a high risk of hospitalization.     Pt is high risk for covid complications based on co-morbidities, however she has not tested positive for covid during this exposure/ set of symptoms. She states she has previously had a " light case" of  covid. Her PCP recommended the infusion now because she lives with individuals with known covid at this time.    Symptoms reviewed that would warrant ED/Hospital evaluation.  Callback number to the infusion center given in case she tests positive for covid during the 10 day window. Patient advised to go to Urgent care or ED with severe symptoms.  Symptoms began 12/30/2019 and  last date she would be eligible for infusion is 01/08/20.   Patient Active Problem List   Diagnosis Date Noted  . COPD exacerbation ([place]) 06/19/2017  . Non-small cell carcinoma of lung, stage 4 ([place]) 06/19/2017  . Parkinson's disease ([place]) 06/19/2017  . Tamponade 02/28/2017  . Pericardial effusion 02/28/2017  . Chest pain 02/26/2016  . Colitis 03/02/2015  . Diabetes mellitus type 2, controlled ([place]) 03/02/2015  . Hyperlipidemia 03/02/2015  . OSA (obstructive sleep apnea) 05/10/2012  . Essential tremor 03/19/2012  . DIVERTICULITIS OF COLON 02/03/2008  . ANEMIA, IRON DEFICIENCY 12/27/2007  . HEMORRHOIDS, INTERNAL 12/27/2007  . GERD 12/27/2007  . GASTRITIS 12/27/2007  . DIVERTICULOSIS, COLON 12/27/2007  . PUD, HX OF 12/27/2007  . COLONIC POLYPS, HX OF 12/27/2007  . Generalized anxiety disorder 04/23/2007  . Essential hypertension 04/23/2007  . ARTHRITIS 04/23/2007  . SLEEP APNEA 04/23/2007  . Type 2 diabetes mellitus with peripheral neuropathy ([place]) 04/25/1991  . Asthma 04/23/1987    [name]

## 2020-01-02 ENCOUNTER — Other Ambulatory Visit (HOSPITAL_COMMUNITY): Payer: Self-pay | Admitting: Family

## 2020-01-02 ENCOUNTER — Telehealth (HOSPITAL_COMMUNITY): Payer: Self-pay | Admitting: Family

## 2020-01-02 NOTE — Telephone Encounter (Signed)
Spoke with Dr. Joya Gaskins regarding patient's symptoms, medical history, and history of receiving [facility] 04/17/2019 for covid. He states to schedule the patient for an infusion appointment, and have the patient either take a rapid covid test herself, or come to the clinic tomorrow and take a rapid covid test he will bring in for her. If she is positive she can have the [facility] infusion. If she is negative, she cannot have infusion.

## 2020-01-02 NOTE — Progress Notes (Addendum)
I connected by phone with [name] on 01/02/2020 at 12:38 PM to discuss the potential use of a new treatment for mild to moderate COVID-19 viral infection in non-hospitalized patients.  This patient is a 74 y.o. female that meets the FDA criteria for Emergency Use Authorization of COVID monoclonal antibody casirivimab/imdevimab.  Patient will need a positive covid test prior to infusion. She will need to be tested at the infusion appointment. If the test is negative, she will not be infused. This has been discussed with Dr. Joya Gaskins. Dr. Joya Gaskins will bring a test for the patient.  Patient is aware.   Is within 10 days of symptom onset  Has at least one of the high risk factor(s) for progression to severe COVID-19 and/or hospitalization as defined in EUA.  Specific high risk criteria : Immune compromise, COPD, DM.  Symptoms of runny nose and headache began 12/30/19   I have spoken and communicated the following to the patient or parent/caregiver regarding COVID monoclonal antibody treatment:  1. FDA has authorized the emergency use for the treatment of mild to moderate COVID-19 in adults and pediatric patients with positive results of direct SARS-CoV-2 viral testing who are 39 years of age and older weighing at least 40 kg, and who are at high risk for progressing to severe COVID-19 and/or hospitalization.  2. The significant known and potential risks and benefits of COVID monoclonal antibody, and the extent to which such potential risks and benefits are unknown.  3. Information on available alternative treatments and the risks and benefits of those alternatives, including clinical trials.  4. Patients treated with COVID monoclonal antibody should continue to self-isolate and use infection control measures (e.g., wear mask, isolate, social distance, avoid sharing personal items, clean and disinfect "high touch" surfaces, and frequent handwashing) according to CDC guidelines.   5. The  patient or parent/caregiver has the option to accept or refuse COVID monoclonal antibody treatment.  After reviewing this information with the patient, The patient agreed to proceed with receiving casirivimab\imdevimab infusion and will be provided a copy of the Fact sheet prior to receiving the infusion. [name] 01/02/2020 12:38 PM

## 2020-01-03 ENCOUNTER — Ambulatory Visit (HOSPITAL_COMMUNITY): Payer: Medicare Other

## 2020-01-20 ENCOUNTER — Emergency Department (HOSPITAL_COMMUNITY): Payer: Medicare Other

## 2020-01-20 ENCOUNTER — Emergency Department (HOSPITAL_COMMUNITY)
Admission: EM | Admit: 2020-01-20 | Discharge: 2020-01-21 | Disposition: A | Discharge status: Home / Self Care | Payer: Medicare Other | Attending: Emergency Medicine | Admitting: Emergency Medicine

## 2020-01-20 ENCOUNTER — Other Ambulatory Visit: Payer: Self-pay

## 2020-01-20 DIAGNOSIS — R0602 Shortness of breath: Secondary | ICD-10-CM | POA: Insufficient documentation

## 2020-01-20 DIAGNOSIS — J449 Chronic obstructive pulmonary disease, unspecified: Secondary | ICD-10-CM | POA: Insufficient documentation

## 2020-01-20 DIAGNOSIS — Z5321 Procedure and treatment not carried out due to patient leaving prior to being seen by health care provider: Secondary | ICD-10-CM | POA: Insufficient documentation

## 2020-01-20 LAB — COMPREHENSIVE METABOLIC PANEL
ALT: 13 U/L (ref 0–44)
AST: 12 U/L — ABNORMAL LOW (ref 15–41)
Albumin: 3.2 g/dL — ABNORMAL LOW (ref 3.5–5.0)
Alkaline Phosphatase: 73 U/L (ref 38–126)
Anion gap: 11 (ref 5–15)
BUN: 14 mg/dL (ref 8–23)
CO2: 23 mmol/L (ref 22–32)
Calcium: 9.6 mg/dL (ref 8.9–10.3)
Chloride: 110 mmol/L (ref 98–111)
Creatinine, Ser: 0.89 mg/dL (ref 0.44–1.00)
GFR calc Af Amer: >60 mL/min (ref >60)
GFR calc non Af Amer: >60 mL/min (ref >60)
Glucose, Bld: 86 mg/dL (ref 70–99)
Potassium: 4.1 mmol/L (ref 3.5–5.1)
Sodium: 144 mmol/L (ref 135–145)
Total Bilirubin: 0.4 mg/dL (ref 0.3–1.2)
Total Protein: 6.5 g/dL (ref 6.5–8.1)

## 2020-01-20 LAB — CBC WITH DIFFERENTIAL/PLATELET
Abs Immature Granulocytes: 0.04 10*3/uL (ref 0.00–0.07)
Basophils Absolute: 0 10*3/uL (ref 0.0–0.1)
Basophils Relative: 1 %
Eosinophils Absolute: 0.2 10*3/uL (ref 0.0–0.5)
Eosinophils Relative: 3 %
HCT: 35.7 % — ABNORMAL LOW (ref 36.0–46.0)
Hemoglobin: 11.5 g/dL — ABNORMAL LOW (ref 12.0–15.0)
Immature Granulocytes: 1 %
Lymphocytes Relative: 18 %
Lymphs Abs: 1.2 10*3/uL (ref 0.7–4.0)
MCH: 28.1 pg (ref 26.0–34.0)
MCHC: 32.2 g/dL (ref 30.0–36.0)
MCV: 87.3 fL (ref 80.0–100.0)
Monocytes Absolute: 0.4 10*3/uL (ref 0.1–1.0)
Monocytes Relative: 6 %
Neutro Abs: 4.8 10*3/uL (ref 1.7–7.7)
Neutrophils Relative %: 71 %
Platelets: 231 10*3/uL (ref 150–400)
RBC: 4.09 MIL/uL (ref 3.87–5.11)
RDW: 14.1 % (ref 11.5–15.5)
WBC: 6.6 10*3/uL (ref 4.0–10.5)
nRBC: 0 % (ref 0.0–0.2)

## 2020-01-20 NOTE — ED Triage Notes (Signed)
Patient reported shortness of breath worst with activity and at night, she uses supplemental oxygen. States hx of COPD and stage IV cancer, in remission x 1 year. Oncologist w/ [facility].

## 2020-01-21 NOTE — ED Notes (Signed)
Called x 3 with no answer.

## 2020-01-22 ENCOUNTER — Telehealth: Payer: Self-pay

## 2020-01-22 NOTE — Telephone Encounter (Signed)
EKG ON FILE

## 2020-02-13 ENCOUNTER — Ambulatory Visit: Payer: Medicare Other | Admitting: Internal Medicine

## 2020-02-18 ENCOUNTER — Institutional Professional Consult (permissible substitution): Payer: Medicare Other | Admitting: Pulmonary Disease

## 2020-02-20 ENCOUNTER — Encounter: Payer: Self-pay | Admitting: Internal Medicine

## 2020-02-20 ENCOUNTER — Other Ambulatory Visit: Payer: Self-pay

## 2020-02-20 ENCOUNTER — Telehealth: Payer: Self-pay | Admitting: Radiology

## 2020-02-20 ENCOUNTER — Ambulatory Visit: Payer: Medicare Other | Admitting: Internal Medicine

## 2020-02-20 VITALS — BP 110/60 | HR 87 | Ht 64.0 in | Wt 277.0 lb

## 2020-02-20 DIAGNOSIS — G4733 Obstructive sleep apnea (adult) (pediatric): Secondary | ICD-10-CM | POA: Diagnosis not present

## 2020-02-20 DIAGNOSIS — I480 Paroxysmal atrial fibrillation: Secondary | ICD-10-CM

## 2020-02-20 DIAGNOSIS — J449 Chronic obstructive pulmonary disease, unspecified: Secondary | ICD-10-CM

## 2020-02-20 DIAGNOSIS — I3139 Other pericardial effusion (noninflammatory): Secondary | ICD-10-CM

## 2020-02-20 DIAGNOSIS — I313 Pericardial effusion (noninflammatory): Secondary | ICD-10-CM

## 2020-02-20 DIAGNOSIS — D509 Iron deficiency anemia, unspecified: Secondary | ICD-10-CM | POA: Insufficient documentation

## 2020-02-20 DIAGNOSIS — I10 Essential (primary) hypertension: Secondary | ICD-10-CM

## 2020-02-20 DIAGNOSIS — E119 Type 2 diabetes mellitus without complications: Secondary | ICD-10-CM

## 2020-02-20 DIAGNOSIS — C3490 Malignant neoplasm of unspecified part of unspecified bronchus or lung: Secondary | ICD-10-CM

## 2020-02-20 MED ORDER — DILTIAZEM HCL ER COATED BEADS 120 MG PO CP24: 120.0000 mg | ORAL_CAPSULE | Freq: Every day | ORAL | 3 refills | Status: DC

## 2020-02-20 NOTE — Telephone Encounter (Signed)
Enrolled patient for a 14 day Zio XT  monitor to be mailed to patients home  °

## 2020-02-20 NOTE — Addendum Note (Signed)
Addended by: Maren Beach, Laiana Fratus A on: 02/20/2020 03:02 PM   Modules accepted: Orders

## 2020-02-20 NOTE — Patient Instructions (Signed)
Medication Instructions:  Your physician has recommended you make the following change in your medication:  1- START diltiazem 120 mg by mouth daily.  *If you need a refill on your cardiac medications before your next appointment, please call your pharmacy*  Lab Work: Your physician recommends that you have lab work today- BMET CBC, BNP, Mg, TSH  If you have labs (blood work) drawn today and your tests are completely normal, you will receive your results only by: Marland Kitchen MyChart Message (if you have MyChart) OR . A paper copy in the mail If you have any lab test that is abnormal or we need to change your treatment, we will call you to review the results.  Testing/Procedures: Your physician has requested that you have an echocardiogram. Echocardiography is a painless test that uses sound waves to create images of your heart. It provides your doctor with information about the size and shape of your heart and how well your heart's chambers and valves are working. This procedure takes approximately one hour. There are no restrictions for this procedure.  ZIO XT- Long Term Monitor Instructions   Your physician has requested you wear your ZIO patch monitor___14____days.   This is a single patch monitor.  [employer] supplies one patch monitor per enrollment.  Additional stickers are not available.   Please do not apply patch if you will be having a Nuclear Stress Test, Echocardiogram, Cardiac CT, MRI, or Chest Xray during the time frame you would be wearing the monitor. The patch cannot be worn during these tests.  You cannot remove and re-apply the ZIO XT patch monitor.   Your ZIO patch monitor will be sent USPS Priority mail from [facility] directly to your home address. The monitor may also be mailed to a PO BOX if home delivery is not available.   It may take 3-5 days to receive your monitor after you have been enrolled.   Once you have received you monitor, please review enclosed  instructions.  Your monitor has already been registered assigning a specific monitor serial # to you.   Applying the monitor   Shave hair from upper left chest.   Hold abrader disc by orange tab.  Rub abrader in 40 strokes over left upper chest as indicated in your monitor instructions.   Clean area with 4 enclosed alcohol pads .  Use all pads to assure are is cleaned thoroughly.  Let dry.   Apply patch as indicated in monitor instructions.  Patch will be place under collarbone on left side of chest with arrow pointing upward.   Rub patch adhesive wings for 2 minutes.Remove white label marked "1".  Remove white label marked "2".  Rub patch adhesive wings for 2 additional minutes.   While looking in a mirror, press and release button in center of patch.  A small green light will flash 3-4 times .  This will be your only indicator the monitor has been turned on.     Do not shower for the first 24 hours.  You may shower after the first 24 hours.   Press button if you feel a symptom. You will hear a small click.  Record Date, Time and Symptom in the Patient Log Book.   When you are ready to remove patch, follow instructions on last 2 pages of Patient Log Book.  Stick patch monitor onto last page of Patient Log Book.   Place Patient Log Book in [place] box.  Use locking tab on box and  tape box closed securely.  The [employer] and [employer] has [employer] on it.  Please place in mailbox as soon as possible.  Your physician should have your test results approximately 7 days after the monitor has been mailed back to [facility].   Call [place] at [phone] if you have questions regarding your ZIO XT patch monitor.  Call them immediately if you see an orange light blinking on your monitor.   If your monitor falls off in less than 4 days contact our Monitor department at [phone].  If your monitor becomes loose or falls off after 4 days call [employer] at [phone] for  suggestions on securing your monitor.   Follow-Up: At [facility], you and your health needs are our priority.  As part of our continuing mission to provide you with exceptional heart care, we have created designated Provider Care Teams.  These Care Teams include your primary Cardiologist (physician) and Advanced Practice Providers (APPs -  Physician Assistants and Nurse Practitioners) who all work together to provide you with the care you need, when you need it.  We recommend signing up for the patient portal called "MyChart".  Sign up information is provided on this After Visit Summary.  MyChart is used to connect with patients for Virtual Visits (Telemedicine).  Patients are able to view lab/test results, encounter notes, upcoming appointments, etc.  Non-urgent messages can be sent to your provider as well.   To learn more about what you can do with MyChart, go to [email or web address].    Your next appointment:   2 -3  month(s)  The format for your next appointment:   In Person  Provider:   You may see Werner Lean, MD or one of the following Advanced Practice Providers on your designated Care Team:    Melina Copa, PA-C  Ermalinda Barrios, PA-C   Please keep a record of your blood pressure and heart rate daily.

## 2020-02-20 NOTE — Progress Notes (Signed)
Cardiology Office Note:    Date:  02/20/2020   ID:  [name], DOB Sep 20, 1945, MRN [id number]  PCP:  Jenny Reichmann, PA-C  [facility] Cardiologist:  Werner Lean, MD  [facility] Electrophysiologist:  None   CC: Consulted for the evaluation of atrial fibrillation at the behest of [place], [name], [place]   History of Present Illness:     [name] is a 74 y.o. female with a hx of paroxysmal atrial fibrillation, morbid obesity, Diabetes with HTN, iron deficiency anemia, hx of pericardial tamponade with malignant pericardial effusion and window in 2018, HLD, NSCLC cancer s/p Palliative radiation in 2018 [name] last 02/21/18 ([facility]) presently in remission, COPD.  Patient notes that she was seen by her PCP and found to be in atrial fibrillation.  Patient has some dizzy spells with are sudden onset these occur when walking.  Patient notes that she is always a little bit dizzy when she is walking.  The only time she feels could is when she is sitting or laying.  No PND or orthopnea.  No chest pain.  Denies bendopnea.  Does not LE swelling.  Notes that she feels palpitations when she is walking. No bleeding issues (blood in urine, stool, coughing up blood).  Denies resting palpitations.  No syncope.  Patient notes she just took her inhaler.  Past Medical History:  Diagnosis Date  . Asthma   . Bleeding nose   . Cancer ([place]) 2017   Lung   . Diabetes mellitus   . Diverticulitis   . H. pylori infection   . Hypertension   . Lung nodule 2017   Both lungs-[facility] at [facility].   . Parkinson disease ([place]) 2010  . Tubular adenoma of colon 05/2007    Past Surgical History:  Procedure Laterality Date  . APPENDECTOMY    . CHOLECYSTECTOMY    . COLON SURGERY     colectomy and reversal after diverticular ds  . PARTIAL COLECTOMY    . PERICARDIAL WINDOW N/A 02/28/2017   Procedure: PERICARDIAL WINDOW;  Surgeon: [name], MD;  Location: [facility] OR;   Service: Thoracic;  Laterality: N/A;  . TONSILLECTOMY    . TOTAL KNEE ARTHROPLASTY     right  . VAGINAL HYSTERECTOMY     Current Medications: Current Meds  Medication Sig  . ALPRAZolam (XANAX) 1 MG tablet Take 1 tablet (1 mg total) 2 (two) times daily as needed by mouth for anxiety.  Marland Kitchen BREZTRI AEROSPHERE 160-9-4.8 MCG/ACT AERO Inhale 2 puffs into the lungs 2 (two) times daily.  . fentaNYL (DURAGESIC - DOSED MCG/HR) 100 MCG/HR Place 100 mcg every 3 (three) days onto the skin.  . fluticasone (FLONASE) 50 MCG/ACT nasal spray SHAKE LIQUID AND USE 2 SPRAYS IN EACH NOSTRIL EVERY DAY  . Fluticasone-Salmeterol (ADVAIR) 500-50 MCG/DOSE AEPB Inhale 1 puff into the lungs every 12 (twelve) hours.    Marland Kitchen HUMALOG KWIKPEN 100 UNIT/ML KiwkPen Inject 0.12 mLs (12 Units total) into the skin 3 (three) times daily before meals. Along with your normal sliding scale.  . hydrocortisone (CORTEF) 5 MG tablet Take 5 mg by mouth. Take 3 tablets in the morning and 2 tablets every night.  . insulin glargine (LANTUS) 100 UNIT/ML injection Inject 65 Units into the skin at bedtime.   . metFORMIN (GLUCOPHAGE-XR) 500 MG 24 hr tablet Take 500 mg by mouth 2 (two) times daily.  . methylphenidate (RITALIN) 10 MG tablet Take 10 mg by mouth 2 (two) times  daily.  . omeprazole (PRILOSEC) 20 MG capsule Take 20 mg by mouth daily.  . Oxycodone HCl 20 MG TABS Take 20-40 mg every 4 (four) hours as needed by mouth for pain.  . potassium chloride (KLOR-CON) 10 MEQ tablet Take 10 mEq by mouth daily.  . pregabalin (LYRICA) 50 MG capsule Take 50 mg by mouth daily.  . pregabalin (LYRICA) 75 MG capsule Take 75 mg by mouth at bedtime.  . promethazine (PHENERGAN) 12.5 MG tablet TAKE 1 TABLET(12.5 MG) BY MOUTH EVERY 6 HOURS AS NEEDED FOR NAUSEA  . sertraline (ZOLOFT) 100 MG tablet Take 100 mg by mouth daily.  . sodium chloride (OCEAN) 0.65 % nasal spray Place into the nose.  . TRELEGY ELLIPTA 100-62.5-25 MCG/INH AEPB Take 1 puff by mouth daily.      Allergies:   Morphine and related and Tape   Social History   Socioeconomic History  . Marital status: Single    Spouse name: Not on file  . Number of children: 5  . Years of education: Not on file  . Highest education level: Not on file  Occupational History  . Occupation: retired    Comment: Charity fundraiser; [employer]  Tobacco Use  . Smoking status: Former Smoker    Quit date: 03/20/1987    Years since quitting: 32.9  . Smokeless tobacco: Never Used  Vaping Use  . Vaping Use: Never used  Substance and Sexual Activity  . Alcohol use: No  . Drug use: No  . Sexual activity: Never  Other Topics Concern  . Not on file  Social History Narrative  . Not on file   Social Determinants of Health   Financial Resource Strain:   . Difficulty of Paying Living Expenses: Not on file  Food Insecurity:   . Worried About Charity fundraiser in the Last Year: Not on file  . Ran Out of Food in the Last Year: Not on file  Transportation Needs:   . Lack of Transportation (Medical): Not on file  . Lack of Transportation (Non-Medical): Not on file  Physical Activity:   . Days of Exercise per Week: Not on file  . Minutes of Exercise per Session: Not on file  Stress:   . Feeling of Stress : Not on file  Social Connections:   . Frequency of Communication with Friends and Family: Not on file  . Frequency of Social Gatherings with Friends and Family: Not on file  . Attends Religious Services: Not on file  . Active Member of Clubs or Organizations: Not on file  . Attends Archivist Meetings: Not on file  . Marital Status: Not on file     Family History: The patient'sfamily history includes Diabetes Mellitus II in her sister and son. There is no history of Colon cancer. Brother died from heart disease  ROS:   Please see the history of present illness.    All other systems reviewed and are negative.  EKGs/Labs/Other Studies Reviewed:    The following studies were  reviewed today:  EKG:   EKG is  ordered today.  The ekg ordered today demonstrates SR with PACs rate 87 RBBB 01/21/20 sinus rhythm 89 with RBBB with PACS in triplets 01/20/20 ECG atrial fibrillation with transition to SR ventricular rate 85  Recent Labs: 01/20/2020: ALT 13; BUN 14; Creatinine, Ser 0.89; Hemoglobin 11.5; Platelets 231; Potassium 4.1; Sodium 144  Recent Lipid Panel    Component Value Date/Time   CHOL  10/01/2009 0201  153        ATP III CLASSIFICATION:  <200     mg/dL   Desirable  200-239  mg/dL   Borderline High  >=240    mg/dL   High          TRIG 272 (H) 10/01/2009 0201   HDL 37 (L) 10/01/2009 0201   CHOLHDL 4.1 10/01/2009 0201   VLDL 54 (H) 10/01/2009 0201   LDLCALC  10/01/2009 0201    62        Total Cholesterol/HDL:CHD Risk Coronary Heart Disease Risk Table                     Men   Women  1/2 Average Risk   3.4   3.3  Average Risk       5.0   4.4  2 X Average Risk   9.6   7.1  3 X Average Risk  23.4   11.0        Use the calculated Patient Ratio above and the CHD Risk Table to determine the patient's CHD Risk.        ATP III CLASSIFICATION (LDL):  <100     mg/dL   Optimal  100-129  mg/dL   Near or Above                    Optimal  130-159  mg/dL   Borderline  160-189  mg/dL   High  >190     mg/dL   Very High   Study Conclusions   - Left ventricle: The cavity size was normal. There was mild  concentric hypertrophy. Systolic function was vigorous. The  estimated ejection fraction was in the range of 65% to 70%. Wall  motion was normal; there were no regional wall motion  abnormalities.  - Aortic valve: Transvalvular velocity was within the normal range.  There was no stenosis. There was no regurgitation.  - Mitral valve: Transvalvular velocity was within the normal range.  There was no evidence for stenosis. There was no regurgitation.  - Right ventricle: The cavity size was normal. Wall thickness was  normal. Systolic function  was normal.  - Tricuspid valve: There was no regurgitation.  - Pericardium, extracardiac: A large pericardial effusion was  identified circumferential to the heart. Measuring up to 4.6 cm.  The effusion appears loculated with a large area of what appears  to be clot (10cm x 3.0 cm) adjacent to the RV free wall and apex.  There was right ventricular chamber collapse. There was evidence  for increased RV-LV interaction demonstrated by respirophasic  changes in transmitral velocities. Features were consistent with  tamponade physiology.   Recommendations: Cardiothoracic surgery consultation. This was  related to the inpatient cardiology service.    Risk Assessment/Calculations:     CHA2DS2-VASc Score = 4  This indicates a 4.8% annual risk of stroke. The patient's score is based upon: CHF History: 0 HTN History: 1 Diabetes History: 1 Stroke History: 0 Vascular Disease History: 0 Age Score: 1 Gender Score: 1      Physical Exam:    VS:  BP 110/60   Pulse 87   Ht 5\' 4"  (1.626 m)   Wt 277 lb (125.6 kg)   BMI 47.55 kg/m     Wt Readings from Last 3 Encounters:  02/20/20 277 lb (125.6 kg)  01/20/20 270 lb (122.5 kg)  11/20/19 (!) 235 lb 14.3 oz (107 kg)     GEN: Obese  female, well developed in no acute distress HEENT: Normal NECK: No JVD; No carotid bruits LYMPHATICS: No lymphadenopathy CARDIAC: RRR, no murmurs, rubs, gallops RESPIRATORY:  Good air movement with only faint expiratory wheezes ABDOMEN: Soft, non-tender, non-distended MUSCULOSKELETAL:  +1 edema; No deformity  SKIN: Warm and dry NEUROLOGIC:  Alert and oriented x 3 PSYCHIATRIC:  Normal affect   ASSESSMENT:    1. Paroxysmal atrial fibrillation (HCC)   2. Morbid obesity ([place])   3. OSA and COPD overlap syndrome ([place])   4. Diabetes mellitus with coincident hypertension ([place])   5. Microcytic anemia   6. Pericardial effusion   7. Non-small cell carcinoma of lung, stage 4, unspecified  laterality (HCC)    PLAN:    In order of problems listed above:  Paroxysmal Atrial Fibrillation OSA on CPAP COPD Morbid Obesity Diabetes with HTN Microcytic Anemia - CHADSVASC=4 - Would start Eliquis 5 mg BID - CBC, BMP, Mg, BNP, TSH - will repeat echocardiogram.  - will check ZioPatch (14 day non live) to assess burden - Would start diltiazem 120 mg patient to check heart rate and BP at home - will continue present lasix 40 mg and change once labs are back  NSCLC 4 with radiation and prior cardiotoxic chemotherapy Malignant pericardial effusion and prior tamponade - Repeat Echo as above  2-3 months follow up unless new symptoms or abnormal test results warranting change in plan  Would be reasonable for  Virtual Follow up Would be reasonable for  APP Follow up   Medication Adjustments/Labs and Tests Ordered: Current medicines are reviewed at length with the patient today.  Concerns regarding medicines are outlined above.  No orders of the defined types were placed in this encounter.  No orders of the defined types were placed in this encounter.   There are no Patient Instructions on file for this visit.   Signed, Werner Lean, MD  02/20/2020 9:10 AM    [place]

## 2020-02-21 LAB — CBC WITH DIFFERENTIAL/PLATELET
Basophils Absolute: 0 10*3/uL (ref 0.0–0.2)
Basos: 1 %
EOS (ABSOLUTE): 0.2 10*3/uL (ref 0.0–0.4)
Eos: 3 %
Hematocrit: 36.4 % (ref 34.0–46.6)
Hemoglobin: 11.8 g/dL (ref 11.1–15.9)
Immature Grans (Abs): 0 10*3/uL (ref 0.0–0.1)
Immature Granulocytes: 1 %
Lymphocytes Absolute: 1.4 10*3/uL (ref 0.7–3.1)
Lymphs: 18 %
MCH: 28 pg (ref 26.6–33.0)
MCHC: 32.4 g/dL (ref 31.5–35.7)
MCV: 86 fL (ref 79–97)
Monocytes Absolute: 0.4 10*3/uL (ref 0.1–0.9)
Monocytes: 6 %
Neutrophils Absolute: 5.6 10*3/uL (ref 1.4–7.0)
Neutrophils: 71 %
Platelets: 252 10*3/uL (ref 150–450)
RBC: 4.22 x10E6/uL (ref 3.77–5.28)
RDW: 14.1 % (ref 11.7–15.4)
WBC: 7.6 10*3/uL (ref 3.4–10.8)

## 2020-02-21 LAB — BASIC METABOLIC PANEL
BUN/Creatinine Ratio: 17 (ref 12–28)
BUN: 15 mg/dL (ref 8–27)
CO2: 23 mmol/L (ref 20–29)
Calcium: 9.5 mg/dL (ref 8.7–10.3)
Chloride: 99 mmol/L (ref 96–106)
Creatinine, Ser: 0.86 mg/dL (ref 0.57–1.00)
GFR calc Af Amer: 78 mL/min/{1.73_m2} (ref >59)
GFR calc non Af Amer: 68 mL/min/{1.73_m2} (ref >59)
Glucose: 205 mg/dL — ABNORMAL HIGH (ref 65–99)
Potassium: 4.3 mmol/L (ref 3.5–5.2)
Sodium: 137 mmol/L (ref 134–144)

## 2020-02-21 LAB — MAGNESIUM: Magnesium: 1.7 mg/dL (ref 1.6–2.3)

## 2020-02-21 LAB — TSH: TSH: 3.18 u[IU]/mL (ref 0.450–4.500)

## 2020-02-21 LAB — PRO B NATRIURETIC PEPTIDE: NT-Pro BNP: 355 pg/mL — ABNORMAL HIGH (ref 0–301)

## 2020-02-24 ENCOUNTER — Telehealth: Payer: Self-pay | Admitting: Internal Medicine

## 2020-02-24 NOTE — Telephone Encounter (Signed)
    Pt c/o swelling: STAT is pt has developed SOB within 24 hours  1) How much weight have you gained and in what time span? none  2) If swelling, where is the swelling located? Face, hands, legs  3) Are you currently taking a fluid pill? No  4) Are you currently SOB? Yes, but he's been sob   5) Do you have a log of your daily weights (if so, list)?   6) Have you gained 3 pounds in a day or 5 pounds in a week? no   7) Have you traveled recently? No  Pt would like to checked in with Dr. Gasper Sells if he still wants her to take fluid pill. She said her face, hands and legs still swollen

## 2020-02-24 NOTE — Telephone Encounter (Signed)
Left message for patient to call back  

## 2020-02-25 ENCOUNTER — Other Ambulatory Visit (INDEPENDENT_AMBULATORY_CARE_PROVIDER_SITE_OTHER): Payer: Medicare Other

## 2020-02-25 DIAGNOSIS — I480 Paroxysmal atrial fibrillation: Secondary | ICD-10-CM | POA: Diagnosis not present

## 2020-02-25 DIAGNOSIS — E119 Type 2 diabetes mellitus without complications: Secondary | ICD-10-CM

## 2020-02-25 DIAGNOSIS — G4733 Obstructive sleep apnea (adult) (pediatric): Secondary | ICD-10-CM

## 2020-02-25 DIAGNOSIS — I3139 Other pericardial effusion (noninflammatory): Secondary | ICD-10-CM

## 2020-02-25 DIAGNOSIS — C3490 Malignant neoplasm of unspecified part of unspecified bronchus or lung: Secondary | ICD-10-CM

## 2020-02-25 DIAGNOSIS — D509 Iron deficiency anemia, unspecified: Secondary | ICD-10-CM

## 2020-02-25 DIAGNOSIS — I313 Pericardial effusion (noninflammatory): Secondary | ICD-10-CM

## 2020-02-25 MED ORDER — FUROSEMIDE 40 MG PO TABS: 40.0000 mg | ORAL_TABLET | Freq: Every day | ORAL | 2 refills | Status: DC

## 2020-02-25 NOTE — Telephone Encounter (Signed)
Patient notified. Will send prescription to [facility] on [address]

## 2020-02-25 NOTE — Telephone Encounter (Signed)
I spoke with patient and reviewed recent lab results with her.  Office note and lab results indicate patient to continue lasix.     Patient is calling to check on lasix as prescription was not sent to pharmacy.  She reports she has not taken lasix in over a year. She is taking potassium as listed She reports swelling in feet and ankles is the same as when she saw Dr Gasper Sells recently. She has had more swelling in her face and hands lately. Will forward to Dr Gasper Sells to see if patient should resume lasix 40 mg daily

## 2020-02-25 NOTE — Telephone Encounter (Signed)
Yes- please return lasix 40 mg.  Her labs on this medication were ok.

## 2020-02-25 NOTE — Addendum Note (Signed)
Addended by: Thompson Grayer on: 02/25/2020 02:06 PM   Modules accepted: Orders

## 2020-03-16 ENCOUNTER — Other Ambulatory Visit: Payer: Self-pay

## 2020-03-16 ENCOUNTER — Ambulatory Visit (HOSPITAL_COMMUNITY): Payer: Medicare Other | Attending: Internal Medicine

## 2020-03-16 DIAGNOSIS — C3490 Malignant neoplasm of unspecified part of unspecified bronchus or lung: Secondary | ICD-10-CM | POA: Insufficient documentation

## 2020-03-16 DIAGNOSIS — G4733 Obstructive sleep apnea (adult) (pediatric): Secondary | ICD-10-CM | POA: Insufficient documentation

## 2020-03-16 DIAGNOSIS — E119 Type 2 diabetes mellitus without complications: Secondary | ICD-10-CM | POA: Diagnosis present

## 2020-03-16 DIAGNOSIS — J449 Chronic obstructive pulmonary disease, unspecified: Secondary | ICD-10-CM | POA: Insufficient documentation

## 2020-03-16 DIAGNOSIS — I313 Pericardial effusion (noninflammatory): Secondary | ICD-10-CM | POA: Insufficient documentation

## 2020-03-16 DIAGNOSIS — D509 Iron deficiency anemia, unspecified: Secondary | ICD-10-CM | POA: Diagnosis present

## 2020-03-16 DIAGNOSIS — I3139 Other pericardial effusion (noninflammatory): Secondary | ICD-10-CM

## 2020-03-16 DIAGNOSIS — I480 Paroxysmal atrial fibrillation: Secondary | ICD-10-CM | POA: Insufficient documentation

## 2020-03-16 DIAGNOSIS — I10 Essential (primary) hypertension: Secondary | ICD-10-CM | POA: Insufficient documentation

## 2020-03-16 LAB — ECHOCARDIOGRAM COMPLETE
AR max vel: 1.88 cm2
AV Area VTI: 2.09 cm2
AV Area mean vel: 1.61 cm2
AV Mean grad: 16 mmHg
AV Peak grad: 26.5 mmHg
Ao pk vel: 2.58 m/s
Area-P 1/2: 2.75 cm2
S' Lateral: 3.5 cm

## 2020-03-17 ENCOUNTER — Ambulatory Visit (INDEPENDENT_AMBULATORY_CARE_PROVIDER_SITE_OTHER): Payer: Medicare Other | Admitting: Internal Medicine

## 2020-03-17 ENCOUNTER — Encounter: Payer: Self-pay | Admitting: Internal Medicine

## 2020-03-17 VITALS — BP 108/58 | HR 100 | Temp 98.4°F | Ht 64.0 in | Wt 275.8 lb

## 2020-03-17 DIAGNOSIS — I5033 Acute on chronic diastolic (congestive) heart failure: Secondary | ICD-10-CM | POA: Diagnosis not present

## 2020-03-17 DIAGNOSIS — G4733 Obstructive sleep apnea (adult) (pediatric): Secondary | ICD-10-CM

## 2020-03-17 DIAGNOSIS — C3490 Malignant neoplasm of unspecified part of unspecified bronchus or lung: Secondary | ICD-10-CM | POA: Diagnosis not present

## 2020-03-17 DIAGNOSIS — C7931 Secondary malignant neoplasm of brain: Secondary | ICD-10-CM

## 2020-03-17 DIAGNOSIS — J441 Chronic obstructive pulmonary disease with (acute) exacerbation: Secondary | ICD-10-CM | POA: Diagnosis not present

## 2020-03-17 DIAGNOSIS — Z9989 Dependence on other enabling machines and devices: Secondary | ICD-10-CM

## 2020-03-17 MED ORDER — ALBUTEROL SULFATE (2.5 MG/3ML) 0.083% IN NEBU: 2.5000 mg | INHALATION_SOLUTION | Freq: Four times a day (QID) | RESPIRATORY_TRACT | 12 refills | Status: AC | PRN

## 2020-03-17 MED ORDER — PREDNISONE 20 MG PO TABS: 40.0000 mg | ORAL_TABLET | Freq: Every day | ORAL | 0 refills | Status: DC

## 2020-03-17 NOTE — Progress Notes (Signed)
[name]    [id number]    [birth year]  Primary Care Physician:Miller, Lattie Haw, MD  Referring Physician: Jenny Reichmann, PA-C No address on file Reason for Consultation: "chronic cough and shortness of breath" Date of Consultation: 03/17/2020  Chief complaint:   Chief Complaint  Patient presents with  . Consult    wheezing, shortness of breath with standing and exertion, productive cough yellow mucus , negative test for covid 03/13/20 has had covid 04/16/19     HPI: [name] is a 74 y.o. woman with COPD and stage IV NSCLC followed at [facility] currently in remission, and s/p covid 19 infection in December 2020. She was given monoclonal ab infusion and treated as an outpatient. Oncologic course complicated by malignant pericardial effusion s/p pericardiocentesis. Finished with her nivolumab and ipilimumab course for 19 months. (stopped October 2019) Now here with increased coughing, shortness of breath, no chest pain worsening over the last month. No fevers or chills. Had a covid test 4 days ago which was negative. Appetite is ok, no nausea, vomiting. She does have lower extremity edema which worse as the day goes on. Had stopped lasix for over a year and recently restarted it a couple weeks ago and is peeing more, feels swelling is going down. But does not weigh herself daily. Now gets sob and very tired and has difficulty with ADLs.  Not improved albuterol inhaler. Trelegy once a day helps her. She has a nebulizer machine at home which does help - takes treatments 2-3 times/week. Diagnosed with COPD officially when she went to [place]. Never had PFTs.  No hospitalizations for breathing. Feels more short of breath when she lays down at night and is on increasing oxygen 2LNC which is bled in through her CPAP. Has 2 pillow orthopnea.    Per notes from her most recent visit with [facility] in October 2021:  "[name] is a 74 y.o. woman with a history of  Stage IV Metastatic Lung adenocarcinoma. She presented with large mediastinal nodal involvement, bilateral hilar LAD, and bilateral pulm nodules. MR brain showed osseous met and a tiny lesion in brain of unclear etiology. A PET/CT scan on 06/08/16 showed stage 4 disease with metastases in the sites of bone metastasis in the spine and right hip. She completed a course of palliative radiation 06/27/16.   Lung cancer  Pathology result is adenocarcinoma TTF1 positive consistent with lung cancer. PDL1 TPS 80% [employer] testing: KRAS mutation positive and TMB 11.   She initiated dual immune therapy on Checkmate 817 with nivolumab plus ipilimumab on 07/26/16. CT scans 10/18/16 showed decrease in known areas of cancer. 11/29/16 CT scans showed continued cancer control and increased size pericardial effusion. Received cycle 13 Ipi/Nivo followed by significant fatigue that placed her mostly in bed. She was diagnosed with immune therapy related adrenal insufficiency stopped treatment and continues off treatment. CT scan looks stable today 08/14/19. CT scans 02/05/2020 showed no progression.   Hx of a large pericardial effusion & tamponade 02/28/2017 She had a pericardial window and her dyspnea improved. She was recommended to take Lasix 18m and Potassium 20 mEq daily. OSH path report indicated pericardial effusion positive for malignant cells. Immune therapy was restarted 04/05/17. ECHO, 11/28/17 did not indicate any cardiomyopathy. EF 55%. Dyspnea mostly at baseline, now off O2  Adrenal insufficiency Fatigue improved on Cortef. [facility]. Her fatigue is likley multifactorial owing to AI and fatigue  at baseline and deconditioning. Stable at current dose of hydrocortef.  "  Social history:  Occupation: worked in Clinical cytogeneticist for 25 years, now retired.  Exposures: lives at home with son.  Smoking history: 28 pack years, quit in [place] History  .  Occupation: retired    Comment: Charity fundraiser; [employer]  Tobacco Use  . Smoking status: Former Smoker    Packs/day: 1.00    Years: 28.00    Pack years: 28.00    Quit date: 03/20/1987    Years since quitting: 33.0  . Smokeless tobacco: Never Used  Vaping Use  . Vaping Use: Never used  Substance and Sexual Activity  . Alcohol use: No  . Drug use: No  . Sexual activity: Never    Relevant family history:  Family History  Problem Relation Age of Onset  . Diabetes Mellitus II Sister   . Diabetes Mellitus II Son   . Colon cancer Neg Hx     Past Medical History:  Diagnosis Date  . Asthma   . Bleeding nose   . Cancer ([place]) 2017   Lung   . Diabetes mellitus   . Diverticulitis   . H. pylori infection   . Hypertension   . Liver disease   . Lung nodule 2017   Both lungs[facility] at [facility].   . Parkinson disease ([place]) 2010  . Sleep apnea   . Tubular adenoma of colon 05/2007    Past Surgical History:  Procedure Laterality Date  . APPENDECTOMY    . CHOLECYSTECTOMY    . COLON SURGERY     colectomy and reversal after diverticular ds  . PARTIAL COLECTOMY    . PERICARDIAL WINDOW N/A 02/28/2017   Procedure: PERICARDIAL WINDOW;  Surgeon: [name], MD;  Location: [facility] OR;  Service: Thoracic;  Laterality: N/A;  . TONSILLECTOMY    . TOTAL KNEE ARTHROPLASTY     right  . VAGINAL HYSTERECTOMY       Physical Exam: Blood pressure (!) 108/58, pulse 100, temperature 98.4 F (36.9 C), temperature source Temporal, height 5' 4"  (1.626 m), weight 275 lb 12.8 oz (125.1 kg), SpO2 93 %. Gen:      Chronically ill appearing, cushingoid, no distress ENT:  no nasal polyps, mucus membranes moist, +nasal congestion Lungs:    Symmetric chest wall excursion, bilateral expiratory wheezes CV:         Tachycardic rate and regular rhythm; no murmurs, rubs, or gallops. 1+ pitting edema Abd:      Distended, + bowel sounds; soft, non-tender; MSK: no acute synovitis of DIP or PIP joints, no mechanics  hands.  Skin:      Warm and dry; no rashes Neuro: normal speech, no focal facial asymmetry Psych: alert and oriented x3, normal mood and affect   Data Reviewed/Medical Decision Making:  Independent interpretation of tests: Imaging: . Review of patient's chest xray 12/2019 images revealed mild cardiomegaly and hyperinflation. The patient's images have been independently reviewed by me.    Report of CT Chest 02/05/20 reviewed from [place] - scattered ground glass nodules unchanged with a background of pulmonary edema.   Echo reviewed - HFpEF with mild AS  PFTs: None on file  Labs:  Lab Results  Component Value Date   WBC 7.6 02/20/2020   HGB 11.8 02/20/2020   HCT 36.4 02/20/2020   MCV 86 02/20/2020   PLT 252 02/20/2020   Lab Results  Component Value Date   NA 137  02/20/2020   K 4.3 02/20/2020   CL 99 02/20/2020   CO2 23 02/20/2020    Immunization status:  Immunization History  Administered Date(s) Administered  . Influenza, High Dose Seasonal PF 01/27/2018, 03/07/2019  . Influenza-Unspecified 01/30/2018, 02/03/2020  . Moderna SARS-COVID-2 Vaccination 08/18/2019, 09/15/2019  . Pneumococcal Polysaccharide-23 03/03/2015    . I reviewed prior external note(s) from oncology, primary care, palliative care . I reviewed the result(s) of the labs and imaging as noted above.  . I have ordered PFTs  Assessment:  Stage IV NSCLC in remission - follows at [facility] Dyspnea on Exertion COPD with acute exacerbation Decompensated HFpEF History of Tobacco Use OSA on CPAP Chronic hypoxemic respiratory failure on 2LNC nocturnally Adrenal insufficiency on   Plan/Recommendations: Ms. Yaklin is a very pleasant 74 y.o. woman who presents with worsening wheezing and dyspnea on exertion. I think she is having a COPD exacerbation right now secondary to heart failure. She has orthopnea and pitting LE edema. Increase lasix to 40 mg BID. She is hesitant to take prednisone due her adrenal  insufficiency. Increase albuterol nebs and take prednisone if not feeling better by Monday.   I spent 61 minutes in the care of this patient today including pre-charting, chart review, review of results, face-to-face care, coordination of care and communication with consultants etc.).   Return to Care: Return in about 4 weeks (around 04/14/2020).  Lenice Llamas, MD Pulmonary and [place]  CC: Jenny Reichmann, [place]*

## 2020-03-17 NOTE — Patient Instructions (Addendum)
Start taking lasix 40 mg twice a day 9am and 4pm.   If you are not feeling better come Monday - please call our office and let us know. You may need to take the prednisone 40 mg x 5 days.

## 2020-04-13 ENCOUNTER — Ambulatory Visit (INDEPENDENT_AMBULATORY_CARE_PROVIDER_SITE_OTHER): Payer: Medicare Other | Admitting: Internal Medicine

## 2020-04-13 DIAGNOSIS — J441 Chronic obstructive pulmonary disease with (acute) exacerbation: Secondary | ICD-10-CM

## 2020-04-13 DIAGNOSIS — G4733 Obstructive sleep apnea (adult) (pediatric): Secondary | ICD-10-CM

## 2020-04-13 DIAGNOSIS — I5033 Acute on chronic diastolic (congestive) heart failure: Secondary | ICD-10-CM

## 2020-04-13 DIAGNOSIS — J9611 Chronic respiratory failure with hypoxia: Secondary | ICD-10-CM

## 2020-04-13 DIAGNOSIS — Z9989 Dependence on other enabling machines and devices: Secondary | ICD-10-CM

## 2020-04-13 DIAGNOSIS — C3490 Malignant neoplasm of unspecified part of unspecified bronchus or lung: Secondary | ICD-10-CM

## 2020-04-13 NOTE — Progress Notes (Signed)
No show

## 2020-04-29 DIAGNOSIS — D510 Vitamin B12 deficiency anemia due to intrinsic factor deficiency: Secondary | ICD-10-CM | POA: Diagnosis not present

## 2020-05-01 DIAGNOSIS — G4733 Obstructive sleep apnea (adult) (pediatric): Secondary | ICD-10-CM | POA: Diagnosis not present

## 2020-05-10 DIAGNOSIS — G4733 Obstructive sleep apnea (adult) (pediatric): Secondary | ICD-10-CM | POA: Diagnosis not present

## 2020-05-24 ENCOUNTER — Ambulatory Visit: Payer: Medicare Other | Admitting: Internal Medicine

## 2020-05-24 NOTE — Progress Notes (Deleted)
Cardiology Office Note:    Date:  05/24/2020   ID:  [name], DOB 08/24/1945, MRN [id number]  PCP:  [name], MD  [facility] Cardiologist:  Werner Lean, MD  [place] Electrophysiologist:  None   CC: follow up atrial fibrillation  History of Present Illness:    [name] is a 75 y.o. female with a hx of PAF, morbid obesity, DM, HTN;  NSCLC cancer s/p Palliative radiation in 2018 Ipilumumab & Nivolumab with malignant effusion, tamponade, and pericardial window presently in remission, adrenal insufficiency (checkpoint inhibitor mediated), COPD, IDA, and NASH Cirrhosis, HLD, Aortic Atherosclerosis and Coronary artery calcification; OSA on CPAP who presented for evaluation 02/20/20.  In interval has seen her oncologist with no changes; had Echo with mild AS and no AF on Ziopatch.  Oncological History notable for: Malignancies: NSCLC Surgery: N/A Chemotherapy: Ipiplmumab and Nivolumab (last 02/03/2018) Cessations for Toxicity: Malignant pericardial effusion with tamponade (11/29/16) and adrenal insufficiency Radiation: 2018, unclear dosage Oncology care spearheaded by: Rob Hickman [name] and Dr. Georgeanna Lea (med); Dr. Merleen Nicely (rad onc), Mr. Waters [facility])   Patient notes that she is doing ***.  Since day prior/last visit notes *** changes.  Relevant interval testing or therapy include ***.  There are no*** interval hospital/ED visit.    No chest pain or pressure ***.  No SOB/DOE*** and no PND/Orthopnea***.  No weight gain or leg swelling***.  No palpitations or syncope ***.  Ambulatory blood pressure ***.  Past Medical History:  Diagnosis Date  . Asthma   . Bleeding nose   . Cancer ([place]) 2017   Lung   . Diabetes mellitus   . Diverticulitis   . H. pylori infection   . Hypertension   . Liver disease   . Lung nodule 2017   [facility] lungs[facility] at [facility].   . Parkinson disease ([place]) 2010  . Sleep apnea   . Tubular adenoma of colon 05/2007    Past Surgical  History:  Procedure Laterality Date  . APPENDECTOMY    . CHOLECYSTECTOMY    . COLON SURGERY     colectomy and reversal after diverticular ds  . PARTIAL COLECTOMY    . PERICARDIAL WINDOW N/A 02/28/2017   Procedure: PERICARDIAL WINDOW;  Surgeon: [name], MD;  Location: [facility] OR;  Service: Thoracic;  Laterality: N/A;  . TONSILLECTOMY    . TOTAL KNEE ARTHROPLASTY     right  . VAGINAL HYSTERECTOMY      Current Medications: No outpatient medications have been marked as taking for the 05/24/20 encounter (Appointment) with Werner Lean, MD.     Allergies:   Morphine and related and Tape   Social History   Socioeconomic History  . Marital status: Single    Spouse name: Not on file  . Number of children: 5  . Years of education: Not on file  . Highest education level: Not on file  Occupational History  . Occupation: retired    Comment: Charity fundraiser; [employer]  Tobacco Use  . Smoking status: Former Smoker    Packs/day: 1.00    Years: 28.00    Pack years: 28.00    Quit date: 03/20/1987    Years since quitting: 33.2  . Smokeless tobacco: Never Used  Vaping Use  . Vaping Use: Never used  Substance and Sexual Activity  . Alcohol use: No  . Drug use: No  . Sexual activity: Never  Other Topics Concern  . Not on file  Social History Narrative  .  Not on file   Social Determinants of Health   Financial Resource Strain: Not on file  Food Insecurity: Not on file  Transportation Needs: Not on file  Physical Activity: Not on file  Stress: Not on file  Social Connections: Not on file     Family History: The patient's family history includes Diabetes Mellitus II in her sister and son. There is no history of Colon cancer. Brother died of heart disease NOS.  ROS:   Please see the history of present illness.    *** All other systems reviewed and are negative.  EKGs/Labs/Other Studies Reviewed:    The following studies were reviewed today:  EKG:    05/24/20: *** 02/20/20 SR with PACs rate 87 RBBB 01/21/20 Sinus rhythm 89 with RBBB with PACS in triplets 01/20/20 Atrial fibrillation with transition to SR ventricular rate 85  Cardiac Event Monitoring: Date: 11/19 Results:No AF detected  Patient had a min HR of 53 bpm, max HR of 146 bpm, and avg HR of 79 bpm.  Predominant underlying rhythm was sinus rhythm.  One run of tachycardia occurred lasting 5 beats with a max rate of 102 bpm.  Isolated PACs were rare (<1.0%), with rare couplets present.  Isolated PVCs were rare (<1.0%), with rare couplets and bigeminy present.  No evidence of complete heart block.  Triggered and diary events associated with sinus rhythm.   No malignant arrhythmias.  Transthoracic Echocardiogram: Date: 03/16/20 Results: Resolution of effusion, mild AS  1. Left ventricular ejection fraction, by estimation, is 55 to 60%. The  left ventricle has normal function. The left ventricle has no regional  wall motion abnormalities. Left ventricular diastolic parameters are  consistent with Grade II diastolic  dysfunction (pseudonormalization).  2. Right ventricular systolic function is mildly reduced. The right  ventricular size is mildly enlarged. There is mildly elevated pulmonary  artery systolic pressure. The estimated right ventricular systolic  pressure is 85.2 mmHg.  3. Left atrial size was moderately dilated.  4. Right atrial size was moderately dilated.  5. The mitral valve is grossly normal. No evidence of mitral valve  regurgitation. The mean mitral valve gradient is 3.0 mmHg with average  heart rate of 78 bpm. Mild MAC.  6. The aortic valve is abnormal. There is moderate calcification of the  aortic valve. Aortic valve regurgitation is not visualized. Mild aortic  valve stenosis. Aortic valve mean gradient measures 16.0 mmHg.  7. The inferior vena cava is normal in size with greater than 50%  respiratory variability, suggesting right  atrial pressure of 3 mmHg.   NonCardiac CT: Date:01/30/2018 Personally Reviewed Results: Aortic Valve Calcification, Aortic Atherosclerosis, RCA coronary artery calcification  NM Stress Testing : Date:10/08/2002 Results: Static Images normal  Recent Labs: 01/20/2020: ALT 13 02/20/2020: BUN 15; Creatinine, Ser 0.86; Hemoglobin 11.8; Magnesium 1.7; NT-Pro BNP 355; Platelets 252; Potassium 4.3; Sodium 137; TSH 3.180  Recent Lipid Panel    Component Value Date/Time   CHOL  10/01/2009 0201    153        ATP III CLASSIFICATION:  <200     mg/dL   Desirable  200-239  mg/dL   Borderline High  >=240    mg/dL   High          TRIG 272 (H) 10/01/2009 0201   HDL 37 (L) 10/01/2009 0201   CHOLHDL 4.1 10/01/2009 0201   VLDL 54 (H) 10/01/2009 0201   LDLCALC  10/01/2009 0201    62  Total Cholesterol/HDL:CHD Risk Coronary Heart Disease Risk Table                     Men   Women  1/2 Average Risk   3.4   3.3  Average Risk       5.0   4.4  2 X Average Risk   9.6   7.1  3 X Average Risk  23.4   11.0        Use the calculated Patient Ratio above and the CHD Risk Table to determine the patient's CHD Risk.        ATP III CLASSIFICATION (LDL):  <100     mg/dL   Optimal  100-129  mg/dL   Near or Above                    Optimal  130-159  mg/dL   Borderline  160-189  mg/dL   High  >190     mg/dL   Very High     Risk Assessment/Calculations:    CHA2DS2-VASc Score = 4  {Confirm score is correct.  If not, click here to update score.  REFRESH note. :[id number]}EXBM indicates a 4.8% annual risk of stroke. The patient's score is based upon: CHF History: No HTN History: Yes Diabetes History: Yes Stroke History: No Vascular Disease History: No Age Score: 1 Gender Score: 1   {FYI    This patient has a significant risk of stroke if diagnosed with atrial fibrillation.  Please consider VKA or DOAC agent for anticoagulation if the bleeding risk is acceptable.  You can also use the  [employer] .[place] for documentation in the A&P of your note.   :[id number]}   Physical Exam:    VS:  There were no vitals taken for this visit.    Wt Readings from Last 3 Encounters:  03/17/20 275 lb 12.8 oz (125.1 kg)  02/20/20 277 lb (125.6 kg)  01/20/20 270 lb (122.5 kg)     GEN: *** Well nourished, well developed in no acute distress HEENT: Normal NECK: No JVD; No carotid bruits LYMPHATICS: No lymphadenopathy CARDIAC: ***RRR, no murmurs, rubs, gallops RESPIRATORY:  Clear to auscultation without rales, wheezing or rhonchi  ABDOMEN: Soft, non-tender, non-distended MUSCULOSKELETAL:  No edema; No deformity  SKIN: Warm and dry NEUROLOGIC:  Alert and oriented x 3 PSYCHIATRIC:  Normal affect   ASSESSMENT:    No diagnosis found. PLAN:    In order of problems listed above:  *** Atrial Fibrillation,  - Risk factors include *** - CHADSVASC=***. - TSH ***, imaging notable for *** left atrial dilation - discussed alcohol and exercise as preventive factors - OSA Risk ***, Epworth Sleepiness Scale great than *** - Will get obtain TTE.   - Continue anticoagulation with ***.  - Continue rate control with *** - Rhythm control options *** - Considerations for DCCV *** - Consideration for ablation ***  *** Essential Hypertension, Diabetes with hypertension, Obesity/DM/HTN - ambulatory blood pressure ***, will start/continue ambulatory BP monitoring; gave education on how to perform ambulatory blood pressure monitoring including the frequency and technique; goal ambulatory blood pressure < 135/85 on average - continue home medications with the exception of *** - will get labs in 7-10 days (BMET, Mg***) - OSA Risk ***, Epworth Sleepiness Scale great than *** - concern for secondary HTN, will order renal artery duplex, and Aldo/renin***  - Arm/Leg BP Differential < 20 mm Hg; not suggestive of coaracation; Arm BP differential < 15  mm Hg not suggestive of subclavian stenosis -  discussed diet (DASH/low sodium), and exercise/weight loss interventions ***  Hyperlipidemia (unspecified) Aortic Atherosclerosis Coronary Artery Calcification NASH Cirrhosis -LDL goal less than ***70/100 - Fasting triglycerides notable for -continue current statin - Zetia vs Repatha*** - Bempedoic acid 180 mg PO daily *** tendon rupture - Vascepa*** - low threshold to send to lipid clinic for additional patient support *** - would stop niacin as it can raise blood sugars and increase uric acid*** - would stop OTC fish oil has little cardiovascular protection, is not regulated by FDA as it is actually a "supplement" and can continue incorrect amounts of DHA/EPA or harmful fats*** -Recheck lipid profile LFTs - gave education on dietary changes  Overall GOC is ***  {Are you ordering a CV Procedure (e.g. stress test, cath, DCCV, TEE, etc)?   Press F2        :[id number]}   *** follow up unless new symptoms or abnormal test results warranting change in plan  Would be reasonable for *** Video Visit Follow up  Would be reasonable for *** APP Follow up   Medication Adjustments/Labs and Tests Ordered: Current medicines are reviewed at length with the patient today.  Concerns regarding medicines are outlined above.  No orders of the defined types were placed in this encounter.  No orders of the defined types were placed in this encounter.   There are no Patient Instructions on file for this visit.   Signed, Werner Lean, MD  05/24/2020 8:23 AM    [place]

## 2020-05-26 DIAGNOSIS — M1712 Unilateral primary osteoarthritis, left knee: Secondary | ICD-10-CM | POA: Diagnosis not present

## 2020-05-27 DIAGNOSIS — R059 Cough, unspecified: Secondary | ICD-10-CM | POA: Diagnosis not present

## 2020-05-27 DIAGNOSIS — Z20822 Contact with and (suspected) exposure to covid-19: Secondary | ICD-10-CM | POA: Diagnosis not present

## 2020-06-01 DIAGNOSIS — G4733 Obstructive sleep apnea (adult) (pediatric): Secondary | ICD-10-CM | POA: Diagnosis not present

## 2020-06-07 DIAGNOSIS — D510 Vitamin B12 deficiency anemia due to intrinsic factor deficiency: Secondary | ICD-10-CM | POA: Diagnosis not present

## 2020-06-10 DIAGNOSIS — E11649 Type 2 diabetes mellitus with hypoglycemia without coma: Secondary | ICD-10-CM | POA: Diagnosis not present

## 2020-06-10 DIAGNOSIS — C7951 Secondary malignant neoplasm of bone: Secondary | ICD-10-CM | POA: Diagnosis not present

## 2020-06-10 DIAGNOSIS — R918 Other nonspecific abnormal finding of lung field: Secondary | ICD-10-CM | POA: Diagnosis not present

## 2020-06-10 DIAGNOSIS — J984 Other disorders of lung: Secondary | ICD-10-CM | POA: Diagnosis not present

## 2020-06-10 DIAGNOSIS — E2740 Unspecified adrenocortical insufficiency: Secondary | ICD-10-CM | POA: Diagnosis not present

## 2020-06-10 DIAGNOSIS — K7581 Nonalcoholic steatohepatitis (NASH): Secondary | ICD-10-CM | POA: Diagnosis not present

## 2020-06-10 DIAGNOSIS — R0609 Other forms of dyspnea: Secondary | ICD-10-CM | POA: Diagnosis not present

## 2020-06-10 DIAGNOSIS — I10 Essential (primary) hypertension: Secondary | ICD-10-CM | POA: Diagnosis not present

## 2020-06-10 DIAGNOSIS — C771 Secondary and unspecified malignant neoplasm of intrathoracic lymph nodes: Secondary | ICD-10-CM | POA: Diagnosis not present

## 2020-06-10 DIAGNOSIS — Z8616 Personal history of COVID-19: Secondary | ICD-10-CM | POA: Diagnosis not present

## 2020-06-10 DIAGNOSIS — Z515 Encounter for palliative care: Secondary | ICD-10-CM | POA: Diagnosis not present

## 2020-06-10 DIAGNOSIS — E278 Other specified disorders of adrenal gland: Secondary | ICD-10-CM | POA: Diagnosis not present

## 2020-06-10 DIAGNOSIS — Z9989 Dependence on other enabling machines and devices: Secondary | ICD-10-CM | POA: Diagnosis not present

## 2020-06-10 DIAGNOSIS — Z923 Personal history of irradiation: Secondary | ICD-10-CM | POA: Diagnosis not present

## 2020-06-10 DIAGNOSIS — C3411 Malignant neoplasm of upper lobe, right bronchus or lung: Secondary | ICD-10-CM | POA: Diagnosis not present

## 2020-06-10 DIAGNOSIS — Z79899 Other long term (current) drug therapy: Secondary | ICD-10-CM | POA: Diagnosis not present

## 2020-06-10 DIAGNOSIS — G893 Neoplasm related pain (acute) (chronic): Secondary | ICD-10-CM | POA: Diagnosis not present

## 2020-06-10 DIAGNOSIS — M545 Low back pain, unspecified: Secondary | ICD-10-CM | POA: Diagnosis not present

## 2020-06-10 DIAGNOSIS — Z87891 Personal history of nicotine dependence: Secondary | ICD-10-CM | POA: Diagnosis not present

## 2020-06-10 DIAGNOSIS — M25519 Pain in unspecified shoulder: Secondary | ICD-10-CM | POA: Diagnosis not present

## 2020-06-10 DIAGNOSIS — G4733 Obstructive sleep apnea (adult) (pediatric): Secondary | ICD-10-CM | POA: Diagnosis not present

## 2020-06-11 ENCOUNTER — Ambulatory Visit: Payer: Medicare Other | Admitting: Internal Medicine

## 2020-06-13 ENCOUNTER — Encounter: Payer: Self-pay | Admitting: Internal Medicine

## 2020-06-14 ENCOUNTER — Ambulatory Visit: Payer: Medicare Other | Admitting: Internal Medicine

## 2020-06-14 ENCOUNTER — Encounter: Payer: Self-pay | Admitting: Internal Medicine

## 2020-06-14 ENCOUNTER — Other Ambulatory Visit: Payer: Self-pay

## 2020-06-14 VITALS — BP 132/60 | HR 87 | Temp 97.8°F | Ht 62.5 in | Wt 277.6 lb

## 2020-06-14 DIAGNOSIS — G4733 Obstructive sleep apnea (adult) (pediatric): Secondary | ICD-10-CM

## 2020-06-14 DIAGNOSIS — C3490 Malignant neoplasm of unspecified part of unspecified bronchus or lung: Secondary | ICD-10-CM | POA: Diagnosis not present

## 2020-06-14 DIAGNOSIS — J449 Chronic obstructive pulmonary disease, unspecified: Secondary | ICD-10-CM

## 2020-06-14 DIAGNOSIS — Z9989 Dependence on other enabling machines and devices: Secondary | ICD-10-CM

## 2020-06-14 DIAGNOSIS — C7951 Secondary malignant neoplasm of bone: Secondary | ICD-10-CM | POA: Diagnosis not present

## 2020-06-14 MED ORDER — ALBUTEROL SULFATE HFA 108 (90 BASE) MCG/ACT IN AERS: 2.0000 | INHALATION_SPRAY | Freq: Four times a day (QID) | RESPIRATORY_TRACT | 11 refills | Status: AC | PRN

## 2020-06-14 NOTE — Progress Notes (Addendum)
WRENLEY SCHEIN    [id number]    12-Aug-1945  Primary Care Physician:Miller, Misty Stanley, MD Date of Appointment: 06/14/2020 Established Patient Visit  Chief complaint:   Chief Complaint  Patient presents with  . Follow-up    More sob over the past couple of months.  Oxygen 2L at HS with CPAP.       HPI: [name] is a 75 y.o. woman with Stage IV NSCLC (in remission, off therapy,) emphysema, OSA on CPAP and on 2LNC.   Interval Updates: Here for follow up. She did take the prednisone for the COPD flare after her last visit with me.   She is currently off all therapy for cancer due to immune mediated adrenal insufficiency and side effects. She lost all her teeth.   She continues to have shortness of breath which seems to be progressing over the past couple of months. She had a CT Chest which showed stability of ggos and nodules as well as stable emphysema.   DME - [employer]  She is on trelegy inhaler as well as albuterol inhaler (ventolin) but doesn't help as much as the proair.  She does have a nebulizer machine at home and the breathing treatments. The trelegy inhaler does help her.   She is taking lasix 40 mg BID and this is doing a good job of keeping fluid off.   I have reviewed the patient's family social and past medical history and updated as appropriate.   Past Medical History:  Diagnosis Date  . Asthma   . Bleeding nose   . Cancer (HCC) 2017   Lung   . Diabetes mellitus   . Diverticulitis   . H. pylori infection   . Hypertension   . Liver disease   . Lung nodule 2017   Both lungs-pt at [facility].   . Parkinson disease (HCC) 2010  . Sleep apnea   . Tubular adenoma of colon 05/2007    Past Surgical History:  Procedure Laterality Date  . APPENDECTOMY    . CHOLECYSTECTOMY    . COLON SURGERY     colectomy and reversal after diverticular ds  . PARTIAL COLECTOMY    . PERICARDIAL WINDOW N/A 02/28/2017   Procedure: PERICARDIAL WINDOW;   Surgeon: Alleen Borne, MD;  Location: [facility] OR;  Service: Thoracic;  Laterality: N/A;  . TONSILLECTOMY    . TOTAL KNEE ARTHROPLASTY     right  . VAGINAL HYSTERECTOMY      Family History  Problem Relation Age of Onset  . Diabetes Mellitus II Sister   . Diabetes Mellitus II Son   . Colon cancer Neg Hx     Social History   Occupational History  . Occupation: retired    Comment: Designer, fashion/clothing; [employer]  Tobacco Use  . Smoking status: Former Smoker    Packs/day: 1.00    Years: 28.00    Pack years: 28.00    Quit date: 03/20/1987    Years since quitting: 33.2  . Smokeless tobacco: Never Used  Vaping Use  . Vaping Use: Never used  Substance and Sexual Activity  . Alcohol use: No  . Drug use: No  . Sexual activity: Never     Physical Exam: Blood pressure 132/60, pulse 87, temperature 97.8 F (36.6 C), height 5' 2.5" (1.588 m), weight 277 lb 9.6 oz (125.9 kg), SpO2 91 %.  Gen:      No acute distress Lungs:    No  increased respiratory effort, symmetric chest wall excursion, diminished breath sounds bilaterally, no wheezes or crackles CV:         Regular rate and rhythm; no murmurs, rubs, or gallops.  Mild lower extremity edema left greater than right   Data Reviewed: Imaging: I have personally reviewed the chest x-ray from September 2021 which shows hyperinflation and cardiomegaly  PFTs: None performed  Labs:  Immunization status: Immunization History  Administered Date(s) Administered  . Influenza Split 04/08/2008, 01/23/2020  . Influenza, High Dose Seasonal PF 01/27/2018, 03/07/2019  . Influenza-Unspecified 01/30/2018, 02/03/2020  . Moderna Sars-Covid-2 Vaccination 08/18/2019, 09/15/2019  . Pneumococcal Polysaccharide-23 03/03/2015    Assessment:  Stage IV NSCLC - follows at [facility] COPD with emphysema HFpEF OSA on CPAP Chronic respiratory failure on [facility] Adrenal Insufficiency, immune mediated, on cortef  Plan/Recommendations: Continue trelegy and  inhaler Needs portable concentrator. Has had trouble with [employer] getting her the supplies she needs including head band to hold the mask to her face.  We will also obtain a CD of her recent imaging from [facility] that I can personally reviewed her CT chest.  She signed a released today.  I have looked back through the former reports at [facility] and it appears that her findings of groundglass opacities and cysts have long preceded her current symptoms worsening of the last 1 to 2 months.  At least as far back as 2019.  This makes checkpoint inhibitor mediated pneumonitis less likely, especially since she is off of this now. I am holding off putting her through PFTs since she has a lot of other medical issues.  I am not sure that it would change management very much at this point anyway. We will obtain a copy of her CPAP download.  Addendum: Reviewed CPAP download, 100% adherence. Set pressure 13 cm H20. Good suppression of AHI and efficacy. However she does have some leak that might be improved if she can get a band to help hold mask to face.   Return to Care: Return in about 3 months (around 09/11/2020).   Durel Salts, MD Pulmonary and Critical Care Medicine [facility] Office:[phone]

## 2020-06-14 NOTE — Patient Instructions (Signed)
The patient should have follow up scheduled with myself in 3 months.   We will reach out to [employer] about your CPAP supplies and have prescribed oxygen for all day and night.

## 2020-06-22 DIAGNOSIS — M5459 Other low back pain: Secondary | ICD-10-CM | POA: Diagnosis not present

## 2020-06-29 DIAGNOSIS — E119 Type 2 diabetes mellitus without complications: Secondary | ICD-10-CM | POA: Diagnosis not present

## 2020-06-29 DIAGNOSIS — I10 Essential (primary) hypertension: Secondary | ICD-10-CM | POA: Diagnosis not present

## 2020-06-29 DIAGNOSIS — E7800 Pure hypercholesterolemia, unspecified: Secondary | ICD-10-CM | POA: Diagnosis not present

## 2020-06-29 DIAGNOSIS — C3490 Malignant neoplasm of unspecified part of unspecified bronchus or lung: Secondary | ICD-10-CM | POA: Diagnosis not present

## 2020-06-29 DIAGNOSIS — E2740 Unspecified adrenocortical insufficiency: Secondary | ICD-10-CM | POA: Diagnosis not present

## 2020-06-29 DIAGNOSIS — G4733 Obstructive sleep apnea (adult) (pediatric): Secondary | ICD-10-CM | POA: Diagnosis not present

## 2020-06-29 DIAGNOSIS — E1140 Type 2 diabetes mellitus with diabetic neuropathy, unspecified: Secondary | ICD-10-CM | POA: Diagnosis not present

## 2020-07-09 ENCOUNTER — Telehealth: Payer: Self-pay | Admitting: Internal Medicine

## 2020-07-09 MED ORDER — TRELEGY ELLIPTA 100-62.5-25 MCG/INH IN AEPB: 1.0000 | INHALATION_SPRAY | Freq: Every day | RESPIRATORY_TRACT | 5 refills | Status: AC

## 2020-07-09 NOTE — Telephone Encounter (Signed)
I have sent the refills to the pharmacy.  I have called the pt and LM on VM to make her aware.

## 2020-07-12 ENCOUNTER — Encounter: Payer: Self-pay | Admitting: Primary Care

## 2020-07-12 ENCOUNTER — Ambulatory Visit (INDEPENDENT_AMBULATORY_CARE_PROVIDER_SITE_OTHER): Payer: Medicare Other | Admitting: Primary Care

## 2020-07-12 ENCOUNTER — Other Ambulatory Visit: Payer: Self-pay

## 2020-07-12 ENCOUNTER — Ambulatory Visit: Payer: Medicare Other

## 2020-07-12 ENCOUNTER — Telehealth: Payer: Self-pay | Admitting: Internal Medicine

## 2020-07-12 DIAGNOSIS — J9611 Chronic respiratory failure with hypoxia: Secondary | ICD-10-CM

## 2020-07-12 DIAGNOSIS — G4733 Obstructive sleep apnea (adult) (pediatric): Secondary | ICD-10-CM

## 2020-07-12 DIAGNOSIS — M5416 Radiculopathy, lumbar region: Secondary | ICD-10-CM | POA: Diagnosis not present

## 2020-07-12 DIAGNOSIS — J441 Chronic obstructive pulmonary disease with (acute) exacerbation: Secondary | ICD-10-CM | POA: Diagnosis not present

## 2020-07-12 DIAGNOSIS — Z9989 Dependence on other enabling machines and devices: Secondary | ICD-10-CM

## 2020-07-12 MED ORDER — DOXYCYCLINE HYCLATE 100 MG PO TABS: 100.0000 mg | ORAL_TABLET | Freq: Two times a day (BID) | ORAL | 0 refills | Status: DC

## 2020-07-12 NOTE — Progress Notes (Signed)
Virtual Visit via Telephone Note  I connected with [name] on 07/12/20 at  4:00 PM EDT by telephone and verified that I am speaking with the correct person using two identifiers.  Location: Patient: Home Provider: Office   I discussed the limitations, risks, security and privacy concerns of performing an evaluation and management service by telephone and the availability of in person appointments. I also discussed with the patient that there may be a patient responsible charge related to this service. The patient expressed understanding and agreed to proceed.   History of Present Illness: 75 year old female, former smoker quit in 1988. PMH significant for COPD/asthma, non-small cell carcinoma stage 4, OSA, HTN, afib, GERD, type 2 diabetes, parkinson's disease, hyperlipidemia, obesity. Patient of Dr. Shearon Stalls, last seen on 06/14/20. Maintained on Trelegy Ellipta, CPAP at 13cm h20 and supplemental/nocturnal oxygen.   07/12/2020 Patient contacted today for acute televisit. She reports increased shortness of breath. Concern for pleural effusion d/t cancer hx. During her last office visit with Dr. Shearon Stalls, she was noted to have progressive shortness of breath over the last 1-2 months. She is maintained on Trelegy inhaler which she has been benefical. Needs portable concentrator. She had CT chest with [facility] which showed stability of GGO's and nodules as well as emphysema. Gound glass opacities and cysts have long preceded her current symptoms dating back to 2019. She is off all therapy for cancer due to immune mediated adrenal insufficiency and side effects.   Over the last two days [name] has noticed her O2 levels have been desaturating into the mid-80s during the day. She is coughing up grey colored mucus. Associated wheezing. She is on hydrocortisone 25mg  daily. She is compliant with Trelegy Ellipta 117mcg daily. She used nebulizer 2-3 times a day without improvement. Her scale is broken at  home. She has some leg swelling in both her leg which is new. She has an MRI today of her spine at 4:30pm with [facility] The self fill apparatus on her home concentrator is broken. Her DME company is supposed to send someone out today to either give her small tanks or fix the part.    Observations/Objective:  - O2 97% RA today after showering - Able to speak in full sentences; audible congestion   Assessment and Plan:  Acute exacerbation COPD with emphysema:  - Productive cough with dark grey sputum x 2 days. Associated wheezing and sob. - Continue Trelegy 100 one puff daily, prn Albuterol hfa/neb q 6 hours and hydrocortisone as prescribed  - RX Doxycycline 1 tab twice daily x 7 days - Needs CXR ( [facility] today for MRI, we will see if they can do XR)  Chronic respiratory failure with hypoxia: - Currently on  2L nocturnal oxygen with CPAP, she is needing to use supplemental oxygen during the day - Per last office note Dr. Shearon Stalls recommended POC  - Needs self fill fixed on home concentrator   Lower extremity edema: - Increased BLE swelling; unable to weigh herself  - Currently on lasix 40mg  daily - Recommend she take an additional dose tomorrow   Non-small cell carcinoma stage IV: - In remission, off therapy - Following with [facility]  OSA on CPAP: - Patient is 100% compliant with CPAP >4 hours from 06/12/20-07/11/20 - Pressure 13cm h20; Residual AHI 2.6 - DME is [employer]    Follow Up Instructions:   - As needed if symptoms do not improve or worsen; I asked patient to update [place] on how she  is feeling later this week through mychart   I discussed the assessment and treatment plan with the patient. The patient was provided an opportunity to ask questions and all were answered. The patient agreed with the plan and demonstrated an understanding of the instructions.   The patient was advised to call back or seek an in-person evaluation if the symptoms worsen or if the condition fails to  improve as anticipated.  I provided 35 minutes of non-face-to-face time during this encounter.   Martyn Ehrich, NP

## 2020-07-12 NOTE — Telephone Encounter (Signed)
Please add televisit with [name] as she has availability. She may need a CXR. Thanks

## 2020-07-12 NOTE — Telephone Encounter (Signed)
Lm for patient.  

## 2020-07-12 NOTE — Telephone Encounter (Signed)
Called and spoke with pt letting her know that SG said we need to get her scheduled for a televisit and pt verbalized understanding. Scheduled her for a televisit with Beth at 4pm today as pt was about to get in the shower. Pt said that she has an MRI scheduled today at 4:30 for her back. Nothing further needed.

## 2020-07-12 NOTE — Telephone Encounter (Signed)
Primary Pulmonologist: ND Last office visit and with whom: 06/14/20 with ND What do we see them for (pulmonary problems): COPD Last OV assessment/plan: Stage IV NSCLC - follows at [facility] COPD with emphysema HFpEF OSA on CPAP Chronic respiratory failure on [facility] Adrenal Insufficiency, immune mediated, on cortef  Plan/Recommendations: Continue trelegy and inhaler Needs portable concentrator. Has had trouble with [employer] getting her the supplies she needs including head band to hold the mask to her face.  We will also obtain a CD of her recent imaging from [facility] that I can personally reviewed her CT chest.  She signed a released today.  I have looked back through the former reports at [facility] and it appears that her findings of groundglass opacities and cysts have long preceded her current symptoms worsening of the last 1 to 2 months.  At least as far back as 2019.  This makes checkpoint inhibitor mediated pneumonitis less likely, especially since she is off of this now. I am holding off putting her through PFTs since she has a lot of other medical issues.  I am not sure that it would change management very much at this point anyway. We will obtain a copy of her CPAP download.  Addendum: Reviewed CPAP download, 100% adherence. Set pressure 13 cm H20. Good suppression of AHI and efficacy. However she does have some leak that might be improved if she can get a band to help hold mask to face.   Return to Care: Return in about 3 months (around 09/11/2020).  Was appointment offered to patient (explain)?  Patient wanted recommendations first    Reason for call: Spoke with patient. She stated that her O2 levels have been dropping into the mid 80s since last Thursday. She has been using O2 during the day which is new for her. She uses 2 of O2 at night with her cpap machine. She is now using 2L of O2 during the day.She has a productive cough with dark grey phlegm. Increased SOB. Denied any wheezing  or fevers. She has not been around who has been sick recently. She has not left her house in 4-5 days.   She is still using her Trelegy 100 and albuterol as needed.   (examples of things to ask: : When did symptoms start? Fever? Cough? Productive? Color to sputum? More sputum than usual? Wheezing? Have you needed increased oxygen? Are you taking your respiratory medications? What over the counter measures have you tried?)  Allergies  Allergen Reactions  . Morphine And Related Nausea And Vomiting and Other (See Comments)    Headache (also)  . Tape Rash    Can only tolerate for short amounts of time (CANNOT BE "LEFT" ON THE SAME SITE FOR LONG)    Immunization History  Administered Date(s) Administered  . Influenza Split 04/08/2008, 01/23/2020  . Influenza, High Dose Seasonal PF 01/27/2018, 03/07/2019  . Influenza-Unspecified 01/30/2018, 02/03/2020  . Moderna Sars-Covid-2 Vaccination 08/18/2019, 09/15/2019  . Pneumococcal Polysaccharide-23 03/03/2015   Her pharmacy is [facility] on [address].   SG, can you please advise ND is not available today? Thanks.

## 2020-07-12 NOTE — Patient Instructions (Signed)
-   Continue Trelegy 100 one puff daily  - RX Doxycycline 1 tab twice daily x 7 days - Continue Hydrocortisone as prescribed  - Needs CXR ( [facility] today for MRI, we will see if they can do XR if unable will need to come to our office) - Order for [employer] to fix home concentrator

## 2020-07-13 ENCOUNTER — Telehealth: Payer: Self-pay | Admitting: Primary Care

## 2020-07-13 NOTE — Telephone Encounter (Signed)
Attempted to call pt to see if she was able to get cxr performed when she went to [facility] for MRI on her back. Unable to reach. Left message for her to return call.  If pt was able to get the cxr done, we will need to call [facility] to get the xray report faxed to our office. If pt was unable to get cxr done, pt will need to come by our office to get cxr performed. Will await return call from pt.

## 2020-07-15 ENCOUNTER — Ambulatory Visit (INDEPENDENT_AMBULATORY_CARE_PROVIDER_SITE_OTHER): Payer: Medicare Other

## 2020-07-15 DIAGNOSIS — J441 Chronic obstructive pulmonary disease with (acute) exacerbation: Secondary | ICD-10-CM

## 2020-07-15 DIAGNOSIS — Z515 Encounter for palliative care: Secondary | ICD-10-CM | POA: Diagnosis not present

## 2020-07-15 DIAGNOSIS — J449 Chronic obstructive pulmonary disease, unspecified: Secondary | ICD-10-CM | POA: Diagnosis not present

## 2020-07-15 DIAGNOSIS — K5903 Drug induced constipation: Secondary | ICD-10-CM | POA: Diagnosis not present

## 2020-07-15 DIAGNOSIS — M792 Neuralgia and neuritis, unspecified: Secondary | ICD-10-CM | POA: Diagnosis not present

## 2020-07-15 DIAGNOSIS — G8929 Other chronic pain: Secondary | ICD-10-CM | POA: Diagnosis not present

## 2020-07-15 DIAGNOSIS — M5442 Lumbago with sciatica, left side: Secondary | ICD-10-CM | POA: Diagnosis not present

## 2020-07-15 DIAGNOSIS — R0602 Shortness of breath: Secondary | ICD-10-CM | POA: Diagnosis not present

## 2020-07-15 DIAGNOSIS — R5383 Other fatigue: Secondary | ICD-10-CM | POA: Diagnosis not present

## 2020-07-15 DIAGNOSIS — T402X5A Adverse effect of other opioids, initial encounter: Secondary | ICD-10-CM | POA: Diagnosis not present

## 2020-07-19 NOTE — Progress Notes (Signed)
CXR normal, both lungs clear

## 2020-07-22 DIAGNOSIS — M5416 Radiculopathy, lumbar region: Secondary | ICD-10-CM | POA: Diagnosis not present

## 2020-07-22 NOTE — Telephone Encounter (Signed)
Pt came to our office 3/24 and had cxr performed. Nothing further needed.

## 2020-07-28 DIAGNOSIS — H40013 Open angle with borderline findings, low risk, bilateral: Secondary | ICD-10-CM | POA: Diagnosis not present

## 2020-07-28 DIAGNOSIS — Z7952 Long term (current) use of systemic steroids: Secondary | ICD-10-CM | POA: Diagnosis not present

## 2020-07-28 DIAGNOSIS — H35373 Puckering of macula, bilateral: Secondary | ICD-10-CM | POA: Diagnosis not present

## 2020-07-28 DIAGNOSIS — E119 Type 2 diabetes mellitus without complications: Secondary | ICD-10-CM | POA: Diagnosis not present

## 2020-07-28 DIAGNOSIS — H25813 Combined forms of age-related cataract, bilateral: Secondary | ICD-10-CM | POA: Diagnosis not present

## 2020-07-30 ENCOUNTER — Other Ambulatory Visit: Payer: Self-pay | Admitting: Internal Medicine

## 2020-07-30 DIAGNOSIS — G4733 Obstructive sleep apnea (adult) (pediatric): Secondary | ICD-10-CM | POA: Diagnosis not present

## 2020-07-30 DIAGNOSIS — J449 Chronic obstructive pulmonary disease, unspecified: Secondary | ICD-10-CM

## 2020-08-09 DIAGNOSIS — G4733 Obstructive sleep apnea (adult) (pediatric): Secondary | ICD-10-CM | POA: Diagnosis not present

## 2020-08-09 DIAGNOSIS — J984 Other disorders of lung: Secondary | ICD-10-CM | POA: Diagnosis not present

## 2020-08-09 DIAGNOSIS — J45909 Unspecified asthma, uncomplicated: Secondary | ICD-10-CM | POA: Diagnosis not present

## 2020-08-10 DIAGNOSIS — M4316 Spondylolisthesis, lumbar region: Secondary | ICD-10-CM | POA: Diagnosis not present

## 2020-08-10 DIAGNOSIS — M5416 Radiculopathy, lumbar region: Secondary | ICD-10-CM | POA: Diagnosis not present

## 2020-08-12 ENCOUNTER — Other Ambulatory Visit: Payer: Self-pay | Admitting: Neurological Surgery

## 2020-08-12 DIAGNOSIS — M4310 Spondylolisthesis, site unspecified: Secondary | ICD-10-CM

## 2020-08-12 DIAGNOSIS — M5416 Radiculopathy, lumbar region: Secondary | ICD-10-CM | POA: Diagnosis not present

## 2020-08-23 DIAGNOSIS — M4316 Spondylolisthesis, lumbar region: Secondary | ICD-10-CM | POA: Diagnosis not present

## 2020-08-23 NOTE — Progress Notes (Signed)
Surgical Instructions    Your procedure is scheduled on Friday, May 6th, 2022  Report to [facility] Main Entrance "A" at 08:30 A.M., then check in with the Admitting office.  Call this number if you have problems the morning of surgery:  [phone]   If you have any questions prior to your surgery date call [phone]: Open Monday-Friday 8am-4pm    Remember:  Do not eat or drink after midnight the night before your surgery   Take these medicines the morning of surgery with A SIP OF WATER   diltiazem (CARDIZEM CD) doxycycline (VIBRA-TABS)  hydrocortisone (CORTEF) pregabalin (LYRICA) sertraline (ZOLOFT) TRELEGY ELLIPTA   If needed  albuterol (PROVENTIL) - please, bring the inhaler with you ALPRAZolam Duanne Moron)  omeprazole (PRILOSEC) methylphenidate (RITALIN)  Oxycodone HCl  promethazine (PHENERGAN)  As of today, STOP taking any Aspirin (unless otherwise instructed by your surgeon) Aleve, Naproxen, Ibuprofen, Motrin, Advil, Goody's, BC's, all herbal medications, fish oil, and all vitamins.   WHAT DO I DO ABOUT MY DIABETES MEDICATION?   Marland Kitchen Do not take SitaGLIPtin-MetFORMIN HCl the morning of surgery.  . THE NIGHT BEFORE SURGERY, take 20 units of insulin glargine (LANTUS).      . THE MORNING OF SURGERY, take 20 units of insulin glargine (LANTUS)           HUMALOG KWIKPEN - do not take bedtime dose the day before surgery and do not take the morning of surgery       . If your CBG is greater than 220 mg/dL, you may take  of your sliding scale (correction) dose of insulin.   HOW TO MANAGE YOUR DIABETES BEFORE AND AFTER SURGERY  Why is it important to control my blood sugar before and after surgery? . Improving blood sugar levels before and after surgery helps healing and can limit problems. . A way of improving blood sugar control is eating a healthy diet by: o  Eating less sugar and carbohydrates o  Increasing activity/exercise o  Talking with your doctor about  reaching your blood sugar goals . High blood sugars (greater than 180 mg/dL) can raise your risk of infections and slow your recovery, so you will need to focus on controlling your diabetes during the weeks before surgery. . Make sure that the doctor who takes care of your diabetes knows about your planned surgery including the date and location.  How do I manage my blood sugar before surgery? . Check your blood sugar at least 4 times a day, starting 2 days before surgery, to make sure that the level is not too high or low. . Check your blood sugar the morning of your surgery when you wake up and every 2 hours until you get to the Short Stay unit. o If your blood sugar is less than 70 mg/dL, you will need to treat for low blood sugar: - Do not take insulin. - Treat a low blood sugar (less than 70 mg/dL) with  cup of clear juice (cranberry or apple), 4 glucose tablets, OR glucose gel. - Recheck blood sugar in 15 minutes after treatment (to make sure it is greater than 70 mg/dL). If your blood sugar is not greater than 70 mg/dL on recheck, call [phone] for further instructions. . Report your blood sugar to the short stay nurse when you get to Short Stay.  . If you are admitted to the hospital after surgery: o Your blood sugar will be checked by the staff and you will probably be given insulin  after surgery (instead of oral diabetes medicines) to make sure you have good blood sugar levels. o The goal for blood sugar control after surgery is 80-180 mg/dL.                      Do not wear jewelry, make up, or nail polish            Do not wear lotions, powders, perfumes, or deodorant.            Do not shave 48 hours prior to surgery.              Do not bring valuables to the hospital.            [facility] is not responsible for any belongings or valuables.  Do NOT Smoke (Tobacco/Vaping) or drink Alcohol 24 hours prior to your procedure If you use a CPAP at night, you may bring all  equipment for your overnight stay.   Contacts, glasses, dentures or bridgework may not be worn into surgery, please bring cases for these belongings   For patients admitted to the hospital, discharge time will be determined by your treatment team.   Patients discharged the day of surgery will not be allowed to drive home, and someone needs to stay with them for 24 hours.    Special instructions:   [place]- Preparing For Surgery  Before surgery, you can play an important role. Because skin is not sterile, your skin needs to be as free of germs as possible. You can reduce the number of germs on your skin by washing with CHG (chlorahexidine gluconate) Soap before surgery.  CHG is an antiseptic cleaner which kills germs and bonds with the skin to continue killing germs even after washing.    Oral Hygiene is also important to reduce your risk of infection.  Remember - BRUSH YOUR TEETH THE MORNING OF SURGERY WITH YOUR REGULAR TOOTHPASTE  Please do not use if you have an allergy to CHG or antibacterial soaps. If your skin becomes reddened/irritated stop using the CHG.  Do not shave (including legs and underarms) for at least 48 hours prior to first CHG shower. It is OK to shave your face.  Please follow these instructions carefully.   1. Shower the NIGHT BEFORE SURGERY and the MORNING OF SURGERY  2. If you chose to wash your hair, wash your hair first as usual with your normal shampoo.  3. After you shampoo, rinse your hair and body thoroughly to remove the shampoo.  4. Use CHG Soap as you would any other liquid soap. You can apply CHG directly to the skin and wash gently with a scrungie or a clean washcloth.   5. Apply the CHG Soap to your body ONLY FROM THE NECK DOWN.  Do not use on open wounds or open sores. Avoid contact with your eyes, ears, mouth and genitals (private parts). Wash Face and genitals (private parts)  with your normal soap.   6. Wash thoroughly, paying special  attention to the area where your surgery will be performed.  7. Thoroughly rinse your body with warm water from the neck down.  8. DO NOT shower/wash with your normal soap after using and rinsing off the CHG Soap.  9. Pat yourself dry with a CLEAN TOWEL.  10. Wear CLEAN PAJAMAS to bed the night before surgery  11. Place CLEAN SHEETS on your bed the night before your surgery  12. DO NOT SLEEP WITH  PETS.   Day of Surgery: Take a shower with CHG soap. Wear Clean/Comfortable clothing the morning of surgery Do not apply any deodorants/lotions.   Remember to brush your teeth WITH YOUR REGULAR TOOTHPASTE.   Please read over the following fact sheets that you were given.

## 2020-08-24 ENCOUNTER — Ambulatory Visit (HOSPITAL_COMMUNITY)
Admission: RE | Admit: 2020-08-24 | Discharge: 2020-08-24 | Disposition: A | Discharge status: Home / Self Care | Payer: Medicare Other | Source: Ambulatory Visit | Attending: Neurological Surgery | Admitting: Neurological Surgery

## 2020-08-24 ENCOUNTER — Other Ambulatory Visit: Payer: Self-pay

## 2020-08-24 ENCOUNTER — Encounter (HOSPITAL_COMMUNITY): Payer: Self-pay

## 2020-08-24 ENCOUNTER — Encounter (HOSPITAL_COMMUNITY)
Admission: RE | Admit: 2020-08-24 | Discharge: 2020-08-24 | Disposition: A | Discharge status: Home / Self Care | Payer: Medicare Other | Source: Ambulatory Visit | Attending: Neurological Surgery | Admitting: Neurological Surgery

## 2020-08-24 DIAGNOSIS — I350 Nonrheumatic aortic (valve) stenosis: Secondary | ICD-10-CM | POA: Diagnosis not present

## 2020-08-24 DIAGNOSIS — I10 Essential (primary) hypertension: Secondary | ICD-10-CM | POA: Diagnosis not present

## 2020-08-24 DIAGNOSIS — Z01818 Encounter for other preprocedural examination: Secondary | ICD-10-CM | POA: Insufficient documentation

## 2020-08-24 DIAGNOSIS — Z9981 Dependence on supplemental oxygen: Secondary | ICD-10-CM | POA: Diagnosis not present

## 2020-08-24 DIAGNOSIS — Z7984 Long term (current) use of oral hypoglycemic drugs: Secondary | ICD-10-CM | POA: Diagnosis not present

## 2020-08-24 DIAGNOSIS — I700 Atherosclerosis of aorta: Secondary | ICD-10-CM | POA: Diagnosis not present

## 2020-08-24 DIAGNOSIS — K7581 Nonalcoholic steatohepatitis (NASH): Secondary | ICD-10-CM | POA: Diagnosis not present

## 2020-08-24 DIAGNOSIS — J449 Chronic obstructive pulmonary disease, unspecified: Secondary | ICD-10-CM | POA: Insufficient documentation

## 2020-08-24 DIAGNOSIS — Z6841 Body Mass Index (BMI) 40.0 and over, adult: Secondary | ICD-10-CM | POA: Diagnosis not present

## 2020-08-24 DIAGNOSIS — Z85118 Personal history of other malignant neoplasm of bronchus and lung: Secondary | ICD-10-CM | POA: Insufficient documentation

## 2020-08-24 DIAGNOSIS — Z792 Long term (current) use of antibiotics: Secondary | ICD-10-CM | POA: Diagnosis not present

## 2020-08-24 DIAGNOSIS — F419 Anxiety disorder, unspecified: Secondary | ICD-10-CM | POA: Diagnosis not present

## 2020-08-24 DIAGNOSIS — Z7951 Long term (current) use of inhaled steroids: Secondary | ICD-10-CM | POA: Insufficient documentation

## 2020-08-24 DIAGNOSIS — E119 Type 2 diabetes mellitus without complications: Secondary | ICD-10-CM | POA: Diagnosis not present

## 2020-08-24 DIAGNOSIS — Z923 Personal history of irradiation: Secondary | ICD-10-CM | POA: Diagnosis not present

## 2020-08-24 DIAGNOSIS — Z79899 Other long term (current) drug therapy: Secondary | ICD-10-CM | POA: Diagnosis not present

## 2020-08-24 DIAGNOSIS — Z87891 Personal history of nicotine dependence: Secondary | ICD-10-CM | POA: Diagnosis not present

## 2020-08-24 DIAGNOSIS — M4310 Spondylolisthesis, site unspecified: Secondary | ICD-10-CM | POA: Diagnosis not present

## 2020-08-24 DIAGNOSIS — Z9049 Acquired absence of other specified parts of digestive tract: Secondary | ICD-10-CM | POA: Insufficient documentation

## 2020-08-24 DIAGNOSIS — Z20822 Contact with and (suspected) exposure to covid-19: Secondary | ICD-10-CM | POA: Diagnosis not present

## 2020-08-24 DIAGNOSIS — Z79891 Long term (current) use of opiate analgesic: Secondary | ICD-10-CM | POA: Diagnosis not present

## 2020-08-24 DIAGNOSIS — G4733 Obstructive sleep apnea (adult) (pediatric): Secondary | ICD-10-CM | POA: Insufficient documentation

## 2020-08-24 DIAGNOSIS — Z794 Long term (current) use of insulin: Secondary | ICD-10-CM | POA: Insufficient documentation

## 2020-08-24 DIAGNOSIS — I480 Paroxysmal atrial fibrillation: Secondary | ICD-10-CM | POA: Diagnosis not present

## 2020-08-24 HISTORY — DX: Cardiac arrhythmia, unspecified: I49.9

## 2020-08-24 HISTORY — DX: Dyspnea, unspecified: R06.00

## 2020-08-24 HISTORY — DX: Myoneural disorder, unspecified: G70.9

## 2020-08-24 HISTORY — DX: Chronic obstructive pulmonary disease, unspecified: J44.9

## 2020-08-24 HISTORY — DX: Anxiety disorder, unspecified: F41.9

## 2020-08-24 HISTORY — DX: Unspecified osteoarthritis, unspecified site: M19.90

## 2020-08-24 HISTORY — DX: Cardiac murmur, unspecified: R01.1

## 2020-08-24 LAB — BASIC METABOLIC PANEL
Anion gap: 13 (ref 5–15)
BUN: 15 mg/dL (ref 8–23)
CO2: 23 mmol/L (ref 22–32)
Calcium: 9.1 mg/dL (ref 8.9–10.3)
Chloride: 98 mmol/L (ref 98–111)
Creatinine, Ser: 1.08 mg/dL — ABNORMAL HIGH (ref 0.44–1.00)
GFR, Estimated: 54 mL/min — ABNORMAL LOW (ref >60)
Glucose, Bld: 331 mg/dL — ABNORMAL HIGH (ref 70–99)
Potassium: 4.3 mmol/L (ref 3.5–5.1)
Sodium: 134 mmol/L — ABNORMAL LOW (ref 135–145)

## 2020-08-24 LAB — CBC WITH DIFFERENTIAL/PLATELET
Abs Immature Granulocytes: 0.04 10*3/uL (ref 0.00–0.07)
Basophils Absolute: 0 10*3/uL (ref 0.0–0.1)
Basophils Relative: 0 %
Eosinophils Absolute: 0.1 10*3/uL (ref 0.0–0.5)
Eosinophils Relative: 2 %
HCT: 38.1 % (ref 36.0–46.0)
Hemoglobin: 12.1 g/dL (ref 12.0–15.0)
Immature Granulocytes: 1 %
Lymphocytes Relative: 13 %
Lymphs Abs: 1.1 10*3/uL (ref 0.7–4.0)
MCH: 27.1 pg (ref 26.0–34.0)
MCHC: 31.8 g/dL (ref 30.0–36.0)
MCV: 85.2 fL (ref 80.0–100.0)
Monocytes Absolute: 0.3 10*3/uL (ref 0.1–1.0)
Monocytes Relative: 4 %
Neutro Abs: 7 10*3/uL (ref 1.7–7.7)
Neutrophils Relative %: 80 %
Platelets: 220 10*3/uL (ref 150–400)
RBC: 4.47 MIL/uL (ref 3.87–5.11)
RDW: 14.8 % (ref 11.5–15.5)
WBC: 8.7 10*3/uL (ref 4.0–10.5)
nRBC: 0 % (ref 0.0–0.2)

## 2020-08-24 LAB — TYPE AND SCREEN
ABO/RH(D): B POS
Antibody Screen: NEGATIVE

## 2020-08-24 LAB — HEMOGLOBIN A1C
Hgb A1c MFr Bld: 10 % — ABNORMAL HIGH (ref 4.8–5.6)
Mean Plasma Glucose: 240.3 mg/dL

## 2020-08-24 LAB — GLUCOSE, CAPILLARY: Glucose-Capillary: 346 mg/dL — ABNORMAL HIGH (ref 70–99)

## 2020-08-24 LAB — SURGICAL PCR SCREEN
MRSA, PCR: NEGATIVE
Staphylococcus aureus: POSITIVE — AB

## 2020-08-24 LAB — PROTIME-INR
INR: 0.9 (ref 0.8–1.2)
Prothrombin Time: 12.2 seconds (ref 11.4–15.2)

## 2020-08-24 LAB — SARS CORONAVIRUS 2 (TAT 6-24 HRS): SARS Coronavirus 2: NEGATIVE

## 2020-08-24 NOTE — Progress Notes (Signed)
Pt called PAT front desk regarding confusion with DM medication instructions that she was given in her pre-op appointment. Pt was concerned that if she doesn't take her correct dosage of insulin her CBG's will be very high prior to surgery. After reviewing instructions that she was given in her pre-admission appointment, corrections had to be made. Pt states that she does not take a bedtime dose of Humalog, therefore she is ok to take usual dose before meals. Pt also instructed to only take half of her Humalog sliding scale dose if her CBG is greater than 220mg /dl. Pt verbalized understanding. All concerns addressed at this time.  Jacqlyn Larsen, RN

## 2020-08-24 NOTE — Progress Notes (Addendum)
PCP - [name] Cardiologist - Gasper Sells, [place] Pulmonologist: Lenice Llamas at [facility] Endocrinologist: Jacelyn Pi  PPM/ICD - denies   Chest x-ray - 08/24/20 EKG - 08/24/20 Stress Test - 10/08/02 ECHO - 03/16/20 Cardiac Cath - over 10 years ago- normal at the time per pt  Sleep Study - yes CPAP - wears nightly- settings 13 per pt  Fasting Blood Sugar - 130-200 Checks Blood Sugar 3-4 times a day   Patient instructed to hold all Aspirin, NSAID's, herbal medications, fish oil and vitamins 7 days prior to surgery.   ERAS Protcol -no   COVID TEST- 08/24/20   Anesthesia review: yes. Pulmonary and cardiac history. Elevated a1c as well. Might need cardiac clearance  Ebony Hail, PA-C notified of cardiac and pulmonary history and is reviewing chart. Patient was instructed to call Cardiologist in order to get pre op clearance (if needed).   Patient denies shortness of breath, fever, cough and chest pain at PAT appointment   All instructions explained to the patient, with a verbal understanding of the material. Patient agrees to go over the instructions while at home for a better understanding. Patient also instructed to self quarantine after being tested for COVID-19. The opportunity to ask questions was provided.

## 2020-08-25 ENCOUNTER — Encounter (HOSPITAL_COMMUNITY): Payer: Self-pay

## 2020-08-25 ENCOUNTER — Telehealth: Payer: Self-pay | Admitting: *Deleted

## 2020-08-25 NOTE — Telephone Encounter (Signed)
   [place] Pre-operative Risk Assessment    Patient Name: [name]  DOB: [birth year]  MRN: [id number]   HEARTCARE STAFF: - Please ensure there is not already an duplicate clearance open for this procedure. - Under Visit Info/Reason for Call, type in Other and utilize the format Clearance MM/DD/YY or Clearance TBD. Do not use dashes or single digits. - If request is for dental extraction, please clarify the # of teeth to be extracted.  Request for surgical clearance: URGENT  1. What type of surgery is being performed? L3-4 LUMBAR FUSION   2. When is this surgery scheduled? 08/27/20   3. What type of clearance is required (medical clearance vs. Pharmacy clearance to hold med vs. Both)? MEDICAL  4. Are there any medications that need to be held prior to surgery and how long? NONE LISTED   5. Practice name and name of physician performing surgery? [facility]; DR. Sherley Bounds   6. What is the office phone number? [phone]   7.   What is the office fax number? [phone] ATTN: [name] X [phone]  8.   Anesthesia type (None, local, MAC, general) ? GENERAL   Julaine Hua 08/25/2020, 1:46 PM  _________________________________________________________________   (provider comments below)

## 2020-08-25 NOTE — Telephone Encounter (Signed)
Left VM

## 2020-08-25 NOTE — Progress Notes (Addendum)
Anesthesia Chart Review:  Case: [id number] Date/Time: 08/27/20 1015   Procedure: PLIF - L3-L4 (N/A Back)   Anesthesia type: General   Pre-op diagnosis: Spondylolisthesis   Location: [facility] OR ROOM 61 / [place] OR   Surgeons: Eustace Moore, MD      DISCUSSION: Patient is a 75 year old female scheduled for the above procedure.  History includes former smoker (quit 03/20/87), COPD, lung cancer (stage IV RUL non-small cell lung cancer s/p palliative radiation to back and right hip & immunotherapy 2018 at [facility], last immune therapy 02/21/18 due to increased fatigue and started on Cortef for what sounds like adrenal insufficiency; developed malignant pericardial effusion with tamponade, s/p subxiphoid pericardial window 02/28/17), asthma, HTN, DM2, neuropathy (feet), PAF, murmur (mild AS 03/16/20 echo), OSA (s/p UPPV; uses O2 2L with CPAP), tremor (as of 10/04/12 neurologist Dr. 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 BMI is consistent with morbid obesity.    Last evaluated by cardiologist Dr. Dimas Chyle on 02/20/20 after PCP noted patient in afib (~ 01/20/20) with some episodes of dizziness when walking. Some exertional palpitations. No chest pain, PND, orthopnea, or resting palpitations. No syncope. EKG showed SR with PACs, RBBB at 02/20/20 visit. Note suggests he was going to start Eliquis, but patient reported he wanted to wait to see what her event monitor showed. 5 day monitor 02/2020 showed predominantly, rare PACs/PVCs, and run of SVT lasting 5 beats. She says she was never prescribed Eliquis. He did start her on diltiazem and continued Lasix. 02/2020 echo showed normal LVEF, grade II diastolic dysfunction, mildly reduced RV systolic  function, RVSP 97.9 mmHg, mils AS. 2-3 month follow-up planned.    Reportedly she feels recovered from COPD exacerbation in March. 08/24/20 CXR in process. No chest pain. She is on diltiazem. As above, she says Eliquis was never prescribed since Holter monitor showed no afib, although not really specified in Dr. Braulio Conte note.  EKG done at PAT appears stable with SR, PACs, RBBB. Prior to her echo and Holter monitor, he had planned for her to follow-up in 2-3 months which never happened, so recommend that surgeon reach out to cardiology regarding surgery plans. I have notified [name] at Dr. Ronnald Ramp office of this, and also that patient's A1c was 10.0% with reported fasting CBGs ~ 130-200.  Chart to be left for follow-up regarding cardiology input and CXR results. 08/24/20 presurgical COVID-19 test negative.    ADDENDUM 08/26/20 1:13 PM: 08/24/20 CXR showed no acute process.  Cardiology input outlined by Sande Rives, PA-C, " Patient was last seen by Dr. Gasper Sells in 01/2020. Patient was contacted today for further pre-op evaluation and reported doing well from a cardiac standpoint since last visit. She denied any chest pain, shortness of breath, orthopnea, PND, edema, palpitations, near syncope/syncope. She is able to complete >4.0 METS without any anginal symptoms. Per Revised Risk Index, considered low risk with 0.9% chance of adverse cardiac events perioperatively. Given past medical history and time since last visit, based on ACC/AHA guidelines, [name] would be at acceptable risk for the planned procedure without further cardiovascular testing."  Anesthesia team to evaluate on the day of rugger. CBG on arrival.   VS: BP 133/62   Pulse 88   Temp 37.2 C (Oral)   Resp 20   Ht _0  (  1.626 m)   Wt 122.7 kg   SpO2 96%   BMI 46.42 kg/m    PROVIDERS: [name], MD is PCP  - Rudean Haskell, MD is cardiologist. See on 02/20/20 for evaluation of afib. (Previously had  been evaluated by Vanice Sarah, MD is 2018 during admission for pericardial effusion and had no significant CAD by cath in 2011.) - Lenice Llamas, MD is pulmonologist. Last visit 07/12/20 with Geraldo Pitter, NP for COPD exacerbation. 07/15/20 CXR showed no active disease. Had previously seen Dr. Shearon Stalls on 06/14/20 with 3 month follow-up planned. Jacelyn Pi, MD is endocrinologist - Ready, Nori Riis, MD is Thoracic Oncologist. Last visit 06/10/20. No new bone metastasis noted on CT scans. Six month follow-up planned. ([place]) - Micah Flesher, NP is Palliative Care Provider. Last visit 07/16/20. ([place]) - Last neurology visit seen was on 10/04/12 with Tat, Wells Guiles, DO for tremor. She was not convinced patient's tremor was due to Parkinson's disease, but ws not able to tolerate weaning levodopa carbidopa at that time--which she is now no longer on.   LABS: Preoperative ;abs noted. See DISCUSSION. A1c 10.0%, consistent with average glucose of 240.  (all labs ordered are listed, but only abnormal results are displayed)  Labs Reviewed  SURGICAL PCR SCREEN - Abnormal; Notable for the following components:      Result Value   Staphylococcus aureus POSITIVE (*)    All other components within normal limits  GLUCOSE, CAPILLARY - Abnormal; Notable for the following components:   Glucose-Capillary 346 (*)    All other components within normal limits  BASIC METABOLIC PANEL - Abnormal; Notable for the following components:   Sodium 134 (*)    Glucose, Bld 331 (*)    Creatinine, Ser 1.08 (*)    GFR, Estimated 54 (*)    All other components within normal limits  HEMOGLOBIN A1C - Abnormal; Notable for the following components:   Hgb A1c MFr Bld 10.0 (*)    All other components within normal limits  SARS CORONAVIRUS 2 (TAT 6-24 HRS)  CBC WITH DIFFERENTIAL/PLATELET  PROTIME-INR  TYPE AND SCREEN     IMAGES: CXR 08/24/20:  FINDINGS: Lung volumes are normal. No consolidative  airspace disease. No pleural effusions. No pneumothorax. No pulmonary nodule or mass noted. Pulmonary vasculature and the cardiomediastinal silhouette are within normal limits. Atherosclerosis in the thoracic aorta. IMPRESSION: 1.  No radiographic evidence of acute cardiopulmonary disease. 2. Aortic atherosclerosis.  CT Chest 06/10/20 ([facility]): IMPRESSION:  1. Stable extensive bilateral groundglass, solid, and part solid nodules  and scattered pulmonary cysts. No significant change in the appearance of  the chest.    CT Abd/pelvis 06/10/20 ([facility]): Impression:  1. Left adrenal nodule measuring 1.4 cm, unchanged in size and with  washout characteristics compatible with a benign adenoma.    MRI L-spine 11/19/19: IMPRESSION: 1. Overall, similar appearance of the lumbar spine as compared to 03/05/2019. No acute abnormality. 2. Stable multifactorial degenerative changes at L3-4 with resultant moderate to severe spinal stenosis. 3. Left foraminal disc protrusion at L5-S1, contacting and potentially irritating the exiting left L5 nerve root. 4. Disc bulging with facet hypertrophy at L4-5 with resultant mild left lateral recess narrowing, with mild bilateral L4 foraminal stenosis.   EKG: 08/24/20: Sinus rhythm with Premature atrial complexes Right bundle branch block Inferior infarct , age undetermined Abnormal ECG Confirmed by Fransico Him ([id number]) on 08/24/2020 9:31:50 PM - Overall, EKG appears stable when compared to 02/20/20 tracing done at  [facility]   CV: Echo 03/16/20: IMPRESSIONS  1. Left ventricular ejection fraction, by estimation, is 55 to 60%. The  left ventricle has normal function. The left ventricle has no regional  wall motion abnormalities. Left ventricular diastolic parameters are  consistent with Grade II diastolic  dysfunction (pseudonormalization).  2. Right ventricular systolic function is mildly reduced. The right  ventricular size is mildly  enlarged. There is mildly elevated pulmonary  artery systolic pressure. The estimated right ventricular systolic  pressure is 41.9 mmHg.  3. Left atrial size was moderately dilated.  4. Right atrial size was moderately dilated.  5. The mitral valve is grossly normal. No evidence of mitral valve  regurgitation. The mean mitral valve gradient is 3.0 mmHg with average  heart rate of 78 bpm. Mild MAC.  6. The aortic valve is abnormal. There is moderate calcification of the  aortic valve. Aortic valve regurgitation is not visualized. Mild aortic  valve stenosis. Aortic valve mean gradient measures 16.0 mmHg.  7. The inferior vena cava is normal in size with greater than 50%  respiratory variability, suggesting right atrial pressure of 3 mmHg.    Long term event monitor 02/25/20-03/02/20: Study Highlights   Patient had a min HR of 53 bpm, max HR of 146 bpm, and avg HR of 79 bpm.  Predominant underlying rhythm was sinus rhythm.  One run of tachycardia occurred lasting 5 beats with a max rate of 102 bpm.  Isolated PACs were rare (<1.0%), with rare couplets present.  Isolated PVCs were rare (<1.0%), with rare couplets and bigeminy present.  No evidence of complete heart block.  Triggered and diary events associated with sinus rhythm.   No malignant arrhythmias.   Cardiac cath 10/01/09: IMPRESSION: 1. Normal left ventricular function with suggestion of concentric left ventricular hypertrophy. 2. No significant coronary obstructive disease.    Past Medical History:  Diagnosis Date  . Anxiety   . Arthritis    generalized  . Asthma   . Bleeding nose   . Cancer ([place]) 2017   Lung   . COPD (chronic obstructive pulmonary disease) ([place])   . Diabetes mellitus   . Diverticulitis   . Dyspnea   . Dysrhythmia    paraxysmol atrial fibrillation   . H. pylori infection   . Heart murmur   . Hypertension   . Liver disease   . Lung nodule 2017   Both lungs[facility] at [facility].   Marland Kitchen  Neuromuscular disorder ([place])    bilateral feet  . Parkinson disease ([place]) 2010  . Sleep apnea   . Tubular adenoma of colon 05/2007    Past Surgical History:  Procedure Laterality Date  . APPENDECTOMY    . BREAST SURGERY     implants at age [age] (removed 5 years later)  . CHOLECYSTECTOMY    . COLON SURGERY     colectomy and reversal after diverticular ds  . PARTIAL COLECTOMY    . PERICARDIAL WINDOW N/A 02/28/2017   Procedure: PERICARDIAL WINDOW;  Surgeon: [name], MD;  Location: [facility] OR;  Service: Thoracic;  Laterality: N/A;  . TONSILLECTOMY    . TOTAL KNEE ARTHROPLASTY     right  . VAGINAL HYSTERECTOMY      MEDICATIONS: . albuterol (PROVENTIL) (2.5 MG/3ML) 0.083% nebulizer solution  . albuterol (VENTOLIN HFA) 108 (90 Base) MCG/ACT inhaler  . ALPRAZolam (XANAX) 1 MG tablet  . Cholecalciferol (VITAMIN D3) 125 MCG (5000 UT) CAPS  . cyanocobalamin (,VITAMIN B-12,) 1000 MCG/ML injection  . diltiazem (  CARDIZEM CD) 120 MG 24 hr capsule  . doxycycline (VIBRA-TABS) 100 MG tablet  . fentaNYL (DURAGESIC - DOSED MCG/HR) 100 MCG/HR  . fentaNYL (DURAGESIC) 25 MCG/HR  . furosemide (LASIX) 40 MG tablet  . HUMALOG KWIKPEN 100 UNIT/ML KiwkPen  . hydrocortisone (CORTEF) 5 MG tablet  . insulin glargine (LANTUS) 100 UNIT/ML injection  . methylphenidate (RITALIN) 10 MG tablet  . omeprazole (PRILOSEC) 20 MG capsule  . Oxycodone HCl 10 MG TABS  . potassium chloride (KLOR-CON) 10 MEQ tablet  . pregabalin (LYRICA) 50 MG capsule  . pregabalin (LYRICA) 75 MG capsule  . promethazine (PHENERGAN) 12.5 MG tablet  . sertraline (ZOLOFT) 100 MG tablet  . SitaGLIPtin-MetFORMIN HCl (JANUMET XR) 50-1000 MG TB24  . TRELEGY ELLIPTA 100-62.5-25 MCG/INH AEPB   No current facility-administered medications for this encounter.    Myra Gianotti, PA-C Surgical Short Stay/Anesthesiology [facility] Phone [phone] [facility] Phone [phone] 08/25/2020 10:47 AM

## 2020-08-25 NOTE — Anesthesia Preprocedure Evaluation (Addendum)
Anesthesia Evaluation  Patient identified by MRN, date of birth, ID band Patient awake    Reviewed: Allergy & Precautions, NPO status , Patient's Chart, lab work & pertinent test results  Airway Mallampati: II  TM Distance: >3 FB     Dental   Pulmonary shortness of breath, asthma , sleep apnea , COPD, former smoker,    breath sounds clear to auscultation       Cardiovascular hypertension, + dysrhythmias + Valvular Problems/Murmurs  Rhythm:Regular Rate:Normal     Neuro/Psych PSYCHIATRIC DISORDERS Anxiety  Neuromuscular disease    GI/Hepatic Neg liver ROS, GERD  ,  Endo/Other  diabetes  Renal/GU negative Renal ROS     Musculoskeletal   Abdominal   Peds  Hematology  (+) anemia ,   Anesthesia Other Findings   Reproductive/Obstetrics                            Anesthesia Physical Anesthesia Plan  ASA: III  Anesthesia Plan: General   Post-op Pain Management:    Induction: Intravenous  PONV Risk Score and Plan: 3 and Ondansetron, Dexamethasone and Midazolam  Airway Management Planned: Oral ETT  Additional Equipment:   Intra-op Plan:   Post-operative Plan: Possible Post-op intubation/ventilation  Informed Consent: I have reviewed the patients History and Physical, chart, labs and discussed the procedure including the risks, benefits and alternatives for the proposed anesthesia with the patient or authorized representative who has indicated his/her understanding and acceptance.     Dental advisory given  Plan Discussed with: CRNA and Anesthesiologist  Anesthesia Plan Comments: (PAT note written by Myra Gianotti, PA-C. )       Anesthesia Quick Evaluation

## 2020-08-26 DIAGNOSIS — M4316 Spondylolisthesis, lumbar region: Secondary | ICD-10-CM | POA: Diagnosis not present

## 2020-08-26 DIAGNOSIS — Z01818 Encounter for other preprocedural examination: Secondary | ICD-10-CM | POA: Diagnosis not present

## 2020-08-26 MED ORDER — DEXTROSE 5 % IV SOLN
3.0000 g | INTRAVENOUS | Status: DC
  Filled 2020-08-26: qty 3000

## 2020-08-26 NOTE — Telephone Encounter (Signed)
Left message to call back and ask to speak with pre-op team.  Darreld Mclean, PA-C 08/26/2020 8:24 AM

## 2020-08-26 NOTE — Telephone Encounter (Signed)
    [name] DOB:  Sep 21, 1945  MRN:  [id number]   Primary Cardiologist: Werner Lean, MD  Chart reviewed as part of pre-operative protocol coverage. Patient was last seen by Dr. Gasper Sells in 01/2020. Patient was contacted today for further pre-op evaluation and reported doing well from a cardiac standpoint since last visit. She denied any chest pain, shortness of breath, orthopnea, PND, edema, palpitations, near syncope/syncope. She is able to complete >4.0 METS without any anginal symptoms. Per Revised Risk Index, considered low risk with 0.9% chance of adverse cardiac events perioperatively. Given past medical history and time since last visit, based on ACC/AHA guidelines, [name] would be at acceptable risk for the planned procedure without further cardiovascular testing.   I will route this recommendation to the requesting party via Epic fax function and remove from pre-op pool.  Please call with questions.  Darreld Mclean, PA-C 08/26/2020, 9:25 AM

## 2020-08-27 ENCOUNTER — Inpatient Hospital Stay (HOSPITAL_COMMUNITY)
Admission: RE | Admit: 2020-08-27 | Discharge: 2020-08-28 | DRG: 454 | Disposition: A | Discharge status: Home / Self Care | Payer: Medicare Other | Attending: Neurological Surgery | Admitting: Neurological Surgery

## 2020-08-27 ENCOUNTER — Inpatient Hospital Stay (HOSPITAL_COMMUNITY): Payer: Medicare Other | Admitting: Certified Registered Nurse Anesthetist

## 2020-08-27 ENCOUNTER — Inpatient Hospital Stay (HOSPITAL_COMMUNITY): Payer: Medicare Other

## 2020-08-27 ENCOUNTER — Inpatient Hospital Stay (HOSPITAL_COMMUNITY): Payer: Medicare Other | Admitting: Vascular Surgery

## 2020-08-27 ENCOUNTER — Other Ambulatory Visit: Payer: Self-pay

## 2020-08-27 ENCOUNTER — Encounter (HOSPITAL_COMMUNITY)
Admission: RE | Disposition: A | Discharge status: Home / Self Care | Payer: Self-pay | Source: Home / Self Care | Attending: Neurological Surgery

## 2020-08-27 ENCOUNTER — Encounter (HOSPITAL_COMMUNITY): Payer: Self-pay | Admitting: Neurological Surgery

## 2020-08-27 DIAGNOSIS — F419 Anxiety disorder, unspecified: Secondary | ICD-10-CM | POA: Diagnosis present

## 2020-08-27 DIAGNOSIS — M4726 Other spondylosis with radiculopathy, lumbar region: Secondary | ICD-10-CM | POA: Diagnosis present

## 2020-08-27 DIAGNOSIS — Z85118 Personal history of other malignant neoplasm of bronchus and lung: Secondary | ICD-10-CM

## 2020-08-27 DIAGNOSIS — E119 Type 2 diabetes mellitus without complications: Secondary | ICD-10-CM | POA: Diagnosis present

## 2020-08-27 DIAGNOSIS — Z7984 Long term (current) use of oral hypoglycemic drugs: Secondary | ICD-10-CM | POA: Diagnosis not present

## 2020-08-27 DIAGNOSIS — K219 Gastro-esophageal reflux disease without esophagitis: Secondary | ICD-10-CM | POA: Diagnosis not present

## 2020-08-27 DIAGNOSIS — G20 Parkinson's disease: Secondary | ICD-10-CM | POA: Diagnosis not present

## 2020-08-27 DIAGNOSIS — I480 Paroxysmal atrial fibrillation: Secondary | ICD-10-CM | POA: Diagnosis not present

## 2020-08-27 DIAGNOSIS — M5416 Radiculopathy, lumbar region: Secondary | ICD-10-CM | POA: Diagnosis not present

## 2020-08-27 DIAGNOSIS — Z833 Family history of diabetes mellitus: Secondary | ICD-10-CM

## 2020-08-27 DIAGNOSIS — M1990 Unspecified osteoarthritis, unspecified site: Secondary | ICD-10-CM | POA: Diagnosis present

## 2020-08-27 DIAGNOSIS — Z981 Arthrodesis status: Secondary | ICD-10-CM | POA: Diagnosis not present

## 2020-08-27 DIAGNOSIS — I10 Essential (primary) hypertension: Secondary | ICD-10-CM | POA: Diagnosis present

## 2020-08-27 DIAGNOSIS — Z9049 Acquired absence of other specified parts of digestive tract: Secondary | ICD-10-CM

## 2020-08-27 DIAGNOSIS — M549 Dorsalgia, unspecified: Secondary | ICD-10-CM | POA: Diagnosis present

## 2020-08-27 DIAGNOSIS — Z885 Allergy status to narcotic agent status: Secondary | ICD-10-CM | POA: Diagnosis not present

## 2020-08-27 DIAGNOSIS — Z96651 Presence of right artificial knee joint: Secondary | ICD-10-CM | POA: Diagnosis present

## 2020-08-27 DIAGNOSIS — Z91048 Other nonmedicinal substance allergy status: Secondary | ICD-10-CM | POA: Diagnosis not present

## 2020-08-27 DIAGNOSIS — Z7951 Long term (current) use of inhaled steroids: Secondary | ICD-10-CM | POA: Diagnosis not present

## 2020-08-27 DIAGNOSIS — Z87891 Personal history of nicotine dependence: Secondary | ICD-10-CM

## 2020-08-27 DIAGNOSIS — Z90710 Acquired absence of both cervix and uterus: Secondary | ICD-10-CM | POA: Diagnosis not present

## 2020-08-27 DIAGNOSIS — Z794 Long term (current) use of insulin: Secondary | ICD-10-CM | POA: Diagnosis not present

## 2020-08-27 DIAGNOSIS — Z79899 Other long term (current) drug therapy: Secondary | ICD-10-CM

## 2020-08-27 DIAGNOSIS — Z6841 Body Mass Index (BMI) 40.0 and over, adult: Secondary | ICD-10-CM

## 2020-08-27 DIAGNOSIS — M4316 Spondylolisthesis, lumbar region: Secondary | ICD-10-CM | POA: Diagnosis present

## 2020-08-27 DIAGNOSIS — J449 Chronic obstructive pulmonary disease, unspecified: Secondary | ICD-10-CM | POA: Diagnosis not present

## 2020-08-27 DIAGNOSIS — M4326 Fusion of spine, lumbar region: Secondary | ICD-10-CM | POA: Diagnosis not present

## 2020-08-27 DIAGNOSIS — Z419 Encounter for procedure for purposes other than remedying health state, unspecified: Secondary | ICD-10-CM

## 2020-08-27 DIAGNOSIS — J45909 Unspecified asthma, uncomplicated: Secondary | ICD-10-CM | POA: Insufficient documentation

## 2020-08-27 DIAGNOSIS — M48061 Spinal stenosis, lumbar region without neurogenic claudication: Secondary | ICD-10-CM | POA: Diagnosis not present

## 2020-08-27 LAB — GLUCOSE, CAPILLARY
Glucose-Capillary: 202 mg/dL — ABNORMAL HIGH (ref 70–99)
Glucose-Capillary: 152 mg/dL — ABNORMAL HIGH (ref 70–99)
Glucose-Capillary: 249 mg/dL — ABNORMAL HIGH (ref 70–99)
Glucose-Capillary: 484 mg/dL — ABNORMAL HIGH (ref 70–99)

## 2020-08-27 SURGERY — POSTERIOR LUMBAR FUSION 1 LEVEL
Anesthesia: General | Site: Back

## 2020-08-27 MED ORDER — HYDROCORTISONE 10 MG PO TABS: 10.0000 mg | ORAL_TABLET | Freq: Every day | ORAL | Status: DC

## 2020-08-27 MED ORDER — ONDANSETRON HCL 4 MG/2ML IJ SOLN
INTRAMUSCULAR | Status: DC | PRN
  Administered 2020-08-27: 4 mg via INTRAVENOUS

## 2020-08-27 MED ORDER — INSULIN ASPART 100 UNIT/ML IJ SOLN
0.0000 [IU] | Freq: Three times a day (TID) | INTRAMUSCULAR | Status: DC
  Administered 2020-08-27: 15 [IU] via SUBCUTANEOUS
  Administered 2020-08-27: 5 [IU] via SUBCUTANEOUS
  Administered 2020-08-28: 11 [IU] via SUBCUTANEOUS

## 2020-08-27 MED ORDER — PREGABALIN 50 MG PO CAPS
50.0000 mg | ORAL_CAPSULE | Freq: Two times a day (BID) | ORAL | Status: DC
  Administered 2020-08-27 – 2020-08-28 (×2): 50 mg via ORAL
  Filled 2020-08-27 (×2): qty 1

## 2020-08-27 MED ORDER — CEFAZOLIN SODIUM-DEXTROSE 2-4 GM/100ML-% IV SOLN
2.0000 g | Freq: Three times a day (TID) | INTRAVENOUS | Status: AC
  Administered 2020-08-27 – 2020-08-28 (×2): 2 g via INTRAVENOUS
  Filled 2020-08-27 (×2): qty 100

## 2020-08-27 MED ORDER — DEXAMETHASONE SODIUM PHOSPHATE 4 MG/ML IJ SOLN
4.0000 mg | Freq: Four times a day (QID) | INTRAMUSCULAR | Status: DC
  Administered 2020-08-27: 4 mg via INTRAVENOUS
  Filled 2020-08-27: qty 1

## 2020-08-27 MED ORDER — THROMBIN 5000 UNITS EX SOLR
CUTANEOUS | Status: AC
  Filled 2020-08-27: qty 5000

## 2020-08-27 MED ORDER — FENTANYL CITRATE (PF) 250 MCG/5ML IJ SOLN
INTRAMUSCULAR | Status: DC | PRN
  Administered 2020-08-27: 50 ug via INTRAVENOUS
  Administered 2020-08-27: 150 ug via INTRAVENOUS

## 2020-08-27 MED ORDER — DEXTROSE 5 % IV SOLN
INTRAVENOUS | Status: DC | PRN
  Administered 2020-08-27: 3 g via INTRAVENOUS

## 2020-08-27 MED ORDER — LIDOCAINE 2% (20 MG/ML) 5 ML SYRINGE
INTRAMUSCULAR | Status: DC | PRN
  Administered 2020-08-27: 80 mg via INTRAVENOUS

## 2020-08-27 MED ORDER — PROPOFOL 10 MG/ML IV BOLUS
INTRAVENOUS | Status: DC | PRN
  Administered 2020-08-27: 160 mg via INTRAVENOUS

## 2020-08-27 MED ORDER — INSULIN GLARGINE 100 UNIT/ML ~~LOC~~ SOLN
40.0000 [IU] | Freq: Every day | SUBCUTANEOUS | Status: AC
  Administered 2020-08-27: 40 [IU] via SUBCUTANEOUS
  Filled 2020-08-27: qty 0.4

## 2020-08-27 MED ORDER — CHLORHEXIDINE GLUCONATE CLOTH 2 % EX PADS: 6.0000 | MEDICATED_PAD | Freq: Once | CUTANEOUS | Status: DC

## 2020-08-27 MED ORDER — FENTANYL 25 MCG/HR TD PT72
1.0000 | MEDICATED_PATCH | TRANSDERMAL | Status: DC
  Administered 2020-08-27: 1 via TRANSDERMAL
  Filled 2020-08-27: qty 1

## 2020-08-27 MED ORDER — ONDANSETRON HCL 4 MG/2ML IJ SOLN: 4.0000 mg | Freq: Four times a day (QID) | INTRAMUSCULAR | Status: DC | PRN

## 2020-08-27 MED ORDER — METFORMIN HCL 500 MG PO TABS
1000.0000 mg | ORAL_TABLET | Freq: Every day | ORAL | Status: DC
  Administered 2020-08-28: 1000 mg via ORAL
  Filled 2020-08-27: qty 2

## 2020-08-27 MED ORDER — FUROSEMIDE 40 MG PO TABS
40.0000 mg | ORAL_TABLET | Freq: Two times a day (BID) | ORAL | Status: DC
  Administered 2020-08-27 – 2020-08-28 (×2): 40 mg via ORAL
  Filled 2020-08-27 (×2): qty 1

## 2020-08-27 MED ORDER — DEXAMETHASONE SODIUM PHOSPHATE 10 MG/ML IJ SOLN
INTRAMUSCULAR | Status: AC
  Filled 2020-08-27: qty 1

## 2020-08-27 MED ORDER — LINAGLIPTIN 5 MG PO TABS
5.0000 mg | ORAL_TABLET | Freq: Every day | ORAL | Status: DC
  Administered 2020-08-28: 5 mg via ORAL
  Filled 2020-08-27: qty 1

## 2020-08-27 MED ORDER — SODIUM CHLORIDE 0.9 % IV SOLN: 1200.0000 mg | Freq: Once | INTRAVENOUS | Status: DC

## 2020-08-27 MED ORDER — SENNA 8.6 MG PO TABS
1.0000 | ORAL_TABLET | Freq: Two times a day (BID) | ORAL | Status: DC
  Administered 2020-08-27: 8.6 mg via ORAL
  Filled 2020-08-27: qty 1

## 2020-08-27 MED ORDER — MENTHOL 3 MG MT LOZG: 1.0000 | LOZENGE | OROMUCOSAL | Status: DC | PRN

## 2020-08-27 MED ORDER — PROPOFOL 10 MG/ML IV BOLUS
INTRAVENOUS | Status: AC
  Filled 2020-08-27: qty 20

## 2020-08-27 MED ORDER — THROMBIN 5000 UNITS EX SOLR
OROMUCOSAL | Status: DC | PRN
  Administered 2020-08-27: 5 mL via TOPICAL

## 2020-08-27 MED ORDER — ONDANSETRON HCL 4 MG PO TABS: 4.0000 mg | ORAL_TABLET | Freq: Four times a day (QID) | ORAL | Status: DC | PRN

## 2020-08-27 MED ORDER — BUPIVACAINE HCL (PF) 0.25 % IJ SOLN
INTRAMUSCULAR | Status: AC
  Filled 2020-08-27: qty 30

## 2020-08-27 MED ORDER — BUPIVACAINE HCL (PF) 0.25 % IJ SOLN
INTRAMUSCULAR | Status: DC | PRN
  Administered 2020-08-27: 3 mL

## 2020-08-27 MED ORDER — OXYCODONE HCL 5 MG PO TABS
10.0000 mg | ORAL_TABLET | ORAL | Status: DC | PRN
  Administered 2020-08-27 – 2020-08-28 (×2): 10 mg via ORAL
  Filled 2020-08-27 (×2): qty 2

## 2020-08-27 MED ORDER — SITAGLIP PHOS-METFORMIN HCL ER 50-1000 MG PO TB24: 1.0000 | ORAL_TABLET | Freq: Every day | ORAL | Status: DC

## 2020-08-27 MED ORDER — ALBUTEROL SULFATE HFA 108 (90 BASE) MCG/ACT IN AERS: 2.0000 | INHALATION_SPRAY | Freq: Once | RESPIRATORY_TRACT | Status: DC | PRN

## 2020-08-27 MED ORDER — THROMBIN 20000 UNITS EX SOLR
CUTANEOUS | Status: DC | PRN
  Administered 2020-08-27: 20 mL via TOPICAL

## 2020-08-27 MED ORDER — ACETAMINOPHEN 500 MG PO TABS
1000.0000 mg | ORAL_TABLET | ORAL | Status: AC
  Administered 2020-08-27: 1000 mg via ORAL

## 2020-08-27 MED ORDER — PHENYLEPHRINE 40 MCG/ML (10ML) SYRINGE FOR IV PUSH (FOR BLOOD PRESSURE SUPPORT)
PREFILLED_SYRINGE | INTRAVENOUS | Status: AC
  Filled 2020-08-27: qty 10

## 2020-08-27 MED ORDER — DIPHENHYDRAMINE HCL 50 MG/ML IJ SOLN: 50.0000 mg | Freq: Once | INTRAMUSCULAR | Status: DC | PRN

## 2020-08-27 MED ORDER — GABAPENTIN 300 MG PO CAPS
300.0000 mg | ORAL_CAPSULE | ORAL | Status: AC
  Administered 2020-08-27: 300 mg via ORAL

## 2020-08-27 MED ORDER — SERTRALINE HCL 50 MG PO TABS
100.0000 mg | ORAL_TABLET | Freq: Every day | ORAL | Status: DC
  Administered 2020-08-27: 100 mg via ORAL
  Filled 2020-08-27: qty 2

## 2020-08-27 MED ORDER — FENTANYL 100 MCG/HR TD PT72
1.0000 | MEDICATED_PATCH | TRANSDERMAL | Status: DC
  Administered 2020-08-27: 1 via TRANSDERMAL
  Filled 2020-08-27: qty 1

## 2020-08-27 MED ORDER — ALBUTEROL SULFATE (2.5 MG/3ML) 0.083% IN NEBU: 2.5000 mg | INHALATION_SOLUTION | Freq: Four times a day (QID) | RESPIRATORY_TRACT | Status: DC | PRN

## 2020-08-27 MED ORDER — SUCCINYLCHOLINE CHLORIDE 200 MG/10ML IV SOSY
PREFILLED_SYRINGE | INTRAVENOUS | Status: AC
  Filled 2020-08-27: qty 10

## 2020-08-27 MED ORDER — HYDROCORTISONE 10 MG PO TABS: 10.0000 mg | ORAL_TABLET | ORAL | Status: DC

## 2020-08-27 MED ORDER — SODIUM CHLORIDE 0.9 % IV SOLN: INTRAVENOUS | Status: DC | PRN

## 2020-08-27 MED ORDER — ROCURONIUM BROMIDE 10 MG/ML (PF) SYRINGE
PREFILLED_SYRINGE | INTRAVENOUS | Status: AC
  Filled 2020-08-27: qty 10

## 2020-08-27 MED ORDER — SODIUM CHLORIDE 0.9% FLUSH
3.0000 mL | Freq: Two times a day (BID) | INTRAVENOUS | Status: DC
  Administered 2020-08-27: 3 mL via INTRAVENOUS

## 2020-08-27 MED ORDER — SODIUM CHLORIDE 0.9 % IV SOLN
250.0000 mL | INTRAVENOUS | Status: DC
  Administered 2020-08-27: 250 mL via INTRAVENOUS

## 2020-08-27 MED ORDER — FENTANYL CITRATE (PF) 250 MCG/5ML IJ SOLN
INTRAMUSCULAR | Status: AC
  Filled 2020-08-27: qty 5

## 2020-08-27 MED ORDER — ONDANSETRON HCL 4 MG/2ML IJ SOLN
INTRAMUSCULAR | Status: AC
  Filled 2020-08-27: qty 2

## 2020-08-27 MED ORDER — EPINEPHRINE 0.3 MG/0.3ML IJ SOAJ: 0.3000 mg | Freq: Once | INTRAMUSCULAR | Status: DC | PRN

## 2020-08-27 MED ORDER — METHOCARBAMOL 1000 MG/10ML IJ SOLN
500.0000 mg | Freq: Four times a day (QID) | INTRAVENOUS | Status: DC | PRN
  Filled 2020-08-27: qty 5

## 2020-08-27 MED ORDER — ACETAMINOPHEN 650 MG RE SUPP: 650.0000 mg | RECTAL | Status: DC | PRN

## 2020-08-27 MED ORDER — LACTATED RINGERS IV SOLN: INTRAVENOUS | Status: DC

## 2020-08-27 MED ORDER — SUGAMMADEX SODIUM 200 MG/2ML IV SOLN
INTRAVENOUS | Status: DC | PRN
  Administered 2020-08-27: 250 mg via INTRAVENOUS

## 2020-08-27 MED ORDER — HYDROCORTISONE 5 MG PO TABS
15.0000 mg | ORAL_TABLET | Freq: Every day | ORAL | Status: DC
  Filled 2020-08-27: qty 1

## 2020-08-27 MED ORDER — CHLORHEXIDINE GLUCONATE 0.12 % MT SOLN
15.0000 mL | Freq: Once | OROMUCOSAL | Status: AC
  Administered 2020-08-27: 15 mL via OROMUCOSAL

## 2020-08-27 MED ORDER — FENTANYL CITRATE (PF) 100 MCG/2ML IJ SOLN: 25.0000 ug | INTRAMUSCULAR | Status: DC | PRN

## 2020-08-27 MED ORDER — DEXAMETHASONE 4 MG PO TABS
4.0000 mg | ORAL_TABLET | Freq: Four times a day (QID) | ORAL | Status: DC
  Administered 2020-08-28: 4 mg via ORAL
  Filled 2020-08-27: qty 1

## 2020-08-27 MED ORDER — PREGABALIN 75 MG PO CAPS
75.0000 mg | ORAL_CAPSULE | Freq: Every day | ORAL | Status: DC
  Administered 2020-08-27: 75 mg via ORAL
  Filled 2020-08-27: qty 1

## 2020-08-27 MED ORDER — POTASSIUM CHLORIDE ER 10 MEQ PO TBCR
10.0000 meq | EXTENDED_RELEASE_TABLET | Freq: Every day | ORAL | Status: DC
  Administered 2020-08-28: 10 meq via ORAL
  Filled 2020-08-27 (×2): qty 1

## 2020-08-27 MED ORDER — CHLORHEXIDINE GLUCONATE 0.12 % MT SOLN
OROMUCOSAL | Status: AC
  Filled 2020-08-27: qty 15

## 2020-08-27 MED ORDER — OXYCODONE HCL 5 MG PO TABS
ORAL_TABLET | ORAL | Status: AC
  Administered 2020-08-27: 5 mg via ORAL
  Filled 2020-08-27: qty 1

## 2020-08-27 MED ORDER — DILTIAZEM HCL ER COATED BEADS 120 MG PO CP24: 120.0000 mg | ORAL_CAPSULE | Freq: Every day | ORAL | Status: DC

## 2020-08-27 MED ORDER — GABAPENTIN 300 MG PO CAPS
ORAL_CAPSULE | ORAL | Status: AC
  Filled 2020-08-27: qty 1

## 2020-08-27 MED ORDER — CELECOXIB 200 MG PO CAPS
200.0000 mg | ORAL_CAPSULE | Freq: Two times a day (BID) | ORAL | Status: DC
  Filled 2020-08-27: qty 1

## 2020-08-27 MED ORDER — 0.9 % SODIUM CHLORIDE (POUR BTL) OPTIME
TOPICAL | Status: DC | PRN
  Administered 2020-08-27 (×2): 1000 mL

## 2020-08-27 MED ORDER — PHENYLEPHRINE 40 MCG/ML (10ML) SYRINGE FOR IV PUSH (FOR BLOOD PRESSURE SUPPORT)
PREFILLED_SYRINGE | INTRAVENOUS | Status: DC | PRN
  Administered 2020-08-27: 120 ug via INTRAVENOUS
  Administered 2020-08-27: 80 ug via INTRAVENOUS
  Administered 2020-08-27: 120 ug via INTRAVENOUS
  Administered 2020-08-27: 80 ug via INTRAVENOUS

## 2020-08-27 MED ORDER — FLUTICASONE-UMECLIDIN-VILANT 100-62.5-25 MCG/INH IN AEPB: 1.0000 | INHALATION_SPRAY | Freq: Every day | RESPIRATORY_TRACT | Status: DC

## 2020-08-27 MED ORDER — SUCCINYLCHOLINE CHLORIDE 200 MG/10ML IV SOSY
PREFILLED_SYRINGE | INTRAVENOUS | Status: DC | PRN
  Administered 2020-08-27: 150 mg via INTRAVENOUS

## 2020-08-27 MED ORDER — ALBUTEROL SULFATE HFA 108 (90 BASE) MCG/ACT IN AERS: 2.0000 | INHALATION_SPRAY | Freq: Four times a day (QID) | RESPIRATORY_TRACT | Status: DC | PRN

## 2020-08-27 MED ORDER — HYDROMORPHONE HCL 1 MG/ML IJ SOLN
0.5000 mg | INTRAMUSCULAR | Status: DC | PRN
  Administered 2020-08-27: 0.5 mg via INTRAVENOUS
  Filled 2020-08-27: qty 0.5

## 2020-08-27 MED ORDER — THROMBIN 20000 UNITS EX SOLR
CUTANEOUS | Status: AC
  Filled 2020-08-27: qty 20000

## 2020-08-27 MED ORDER — METHOCARBAMOL 500 MG PO TABS
500.0000 mg | ORAL_TABLET | Freq: Four times a day (QID) | ORAL | Status: DC | PRN
  Administered 2020-08-27 – 2020-08-28 (×3): 500 mg via ORAL
  Filled 2020-08-27 (×2): qty 1

## 2020-08-27 MED ORDER — ROCURONIUM BROMIDE 10 MG/ML (PF) SYRINGE
PREFILLED_SYRINGE | INTRAVENOUS | Status: DC | PRN
  Administered 2020-08-27: 50 mg via INTRAVENOUS
  Administered 2020-08-27 (×3): 10 mg via INTRAVENOUS

## 2020-08-27 MED ORDER — SODIUM CHLORIDE 0.9% FLUSH: 3.0000 mL | INTRAVENOUS | Status: DC | PRN

## 2020-08-27 MED ORDER — METHYLPREDNISOLONE SODIUM SUCC 125 MG IJ SOLR
125.0000 mg | Freq: Once | INTRAMUSCULAR | Status: DC | PRN
Start: 2020-08-27 — End: 2020-08-27

## 2020-08-27 MED ORDER — POTASSIUM CHLORIDE IN NACL 20-0.9 MEQ/L-% IV SOLN: INTRAVENOUS | Status: DC

## 2020-08-27 MED ORDER — ACETAMINOPHEN 500 MG PO TABS
ORAL_TABLET | ORAL | Status: AC
  Filled 2020-08-27: qty 2

## 2020-08-27 MED ORDER — DEXAMETHASONE SODIUM PHOSPHATE 10 MG/ML IJ SOLN: 10.0000 mg | Freq: Once | INTRAMUSCULAR | Status: DC

## 2020-08-27 MED ORDER — ORAL CARE MOUTH RINSE: 15.0000 mL | Freq: Once | OROMUCOSAL | Status: AC

## 2020-08-27 MED ORDER — LIDOCAINE 2% (20 MG/ML) 5 ML SYRINGE
INTRAMUSCULAR | Status: AC
  Filled 2020-08-27: qty 5

## 2020-08-27 MED ORDER — ACETAMINOPHEN 325 MG PO TABS: 650.0000 mg | ORAL_TABLET | ORAL | Status: DC | PRN

## 2020-08-27 MED ORDER — SODIUM CHLORIDE (PF) 0.9 % IJ SOLN
INTRAMUSCULAR | Status: DC | PRN
  Administered 2020-08-27: 4 mL via INTRAVENOUS

## 2020-08-27 MED ORDER — FENTANYL CITRATE (PF) 100 MCG/2ML IJ SOLN
INTRAMUSCULAR | Status: AC
  Administered 2020-08-27: 25 ug via INTRAVENOUS
  Filled 2020-08-27: qty 2

## 2020-08-27 MED ORDER — PHENOL 1.4 % MT LIQD: 1.0000 | OROMUCOSAL | Status: DC | PRN

## 2020-08-27 MED ORDER — ALBUMIN HUMAN 5 % IV SOLN: INTRAVENOUS | Status: DC | PRN

## 2020-08-27 MED ORDER — METHYLPHENIDATE HCL 5 MG PO TABS: 10.0000 mg | ORAL_TABLET | Freq: Two times a day (BID) | ORAL | Status: DC | PRN

## 2020-08-27 MED ORDER — DEXAMETHASONE SODIUM PHOSPHATE 10 MG/ML IJ SOLN
INTRAMUSCULAR | Status: DC | PRN
  Administered 2020-08-27: 5 mg via INTRAVENOUS

## 2020-08-27 MED ORDER — FAMOTIDINE IN NACL 20-0.9 MG/50ML-% IV SOLN: 20.0000 mg | Freq: Once | INTRAVENOUS | Status: DC | PRN

## 2020-08-27 MED ORDER — PHENYLEPHRINE HCL-NACL 10-0.9 MG/250ML-% IV SOLN
INTRAVENOUS | Status: DC | PRN
  Administered 2020-08-27: 40 ug/min via INTRAVENOUS

## 2020-08-27 MED ORDER — METHOCARBAMOL 500 MG PO TABS
ORAL_TABLET | ORAL | Status: AC
  Filled 2020-08-27: qty 1

## 2020-08-27 SURGICAL SUPPLY — 65 items
ADH SKN CLS APL DERMABOND .7 (GAUZE/BANDAGES/DRESSINGS) ×1
ALLOGRAFT BONE ARTHROCELL 5 (Bone Implant) ×1 IMPLANT
APL SKNCLS STERI-STRIP NONHPOA (GAUZE/BANDAGES/DRESSINGS) ×1
BASKET BONE COLLECTION (BASKET) ×2 IMPLANT
BENZOIN TINCTURE PRP APPL 2/3 (GAUZE/BANDAGES/DRESSINGS) ×2 IMPLANT
BLADE BN FN 3.2XSTRL LF (MISCELLANEOUS) IMPLANT
BLADE BONE MILL FINE (MISCELLANEOUS) ×2
BLADE CLIPPER SURG (BLADE) IMPLANT
BUR CARBIDE MATCH 3.0 (BURR) ×2 IMPLANT
CANISTER SUCT 3000ML PPV (MISCELLANEOUS) ×2 IMPLANT
CNTNR URN SCR LID CUP LEK RST (MISCELLANEOUS) ×1 IMPLANT
CONT SPEC 4OZ STRL OR WHT (MISCELLANEOUS) ×2
COVER BACK TABLE 60X90IN (DRAPES) ×2 IMPLANT
COVER WAND RF STERILE (DRAPES) ×2 IMPLANT
DERMABOND ADVANCED (GAUZE/BANDAGES/DRESSINGS) ×1
DERMABOND ADVANCED .7 DNX12 (GAUZE/BANDAGES/DRESSINGS) ×1 IMPLANT
DIFFUSER DRILL AIR PNEUMATIC (MISCELLANEOUS) ×2 IMPLANT
DRAPE C-ARM 42X72 X-RAY (DRAPES) ×4 IMPLANT
DRAPE LAPAROTOMY 100X72X124 (DRAPES) ×2 IMPLANT
DRAPE SURG 17X23 STRL (DRAPES) ×2 IMPLANT
DRSG OPSITE POSTOP 4X6 (GAUZE/BANDAGES/DRESSINGS) ×1 IMPLANT
DURAPREP 26ML APPLICATOR (WOUND CARE) ×2 IMPLANT
ELECT REM PT RETURN 9FT ADLT (ELECTROSURGICAL) ×2
ELECTRODE REM PT RTRN 9FT ADLT (ELECTROSURGICAL) ×1 IMPLANT
EVACUATOR 1/8 PVC DRAIN (DRAIN) ×2 IMPLANT
FIBER BONE ALLOSYNC EXPAND 5 (Bone Implant) ×1 IMPLANT
GAUZE 4X4 16PLY RFD (DISPOSABLE) IMPLANT
GLOVE BIO SURGEON STRL SZ7 (GLOVE) IMPLANT
GLOVE BIO SURGEON STRL SZ8 (GLOVE) ×4 IMPLANT
GLOVE SURG UNDER POLY LF SZ7 (GLOVE) IMPLANT
GOWN STRL REUS W/ TWL LRG LVL3 (GOWN DISPOSABLE) IMPLANT
GOWN STRL REUS W/ TWL XL LVL3 (GOWN DISPOSABLE) ×2 IMPLANT
GOWN STRL REUS W/TWL 2XL LVL3 (GOWN DISPOSABLE) ×2 IMPLANT
GOWN STRL REUS W/TWL LRG LVL3 (GOWN DISPOSABLE) ×2
GOWN STRL REUS W/TWL XL LVL3 (GOWN DISPOSABLE) ×4
HEMOSTAT POWDER KIT SURGIFOAM (HEMOSTASIS) ×1 IMPLANT
KIT BASIN OR (CUSTOM PROCEDURE TRAY) ×2 IMPLANT
KIT BONE MRW ASP ANGEL CPRP (KITS) IMPLANT
KIT TURNOVER KIT B (KITS) ×2 IMPLANT
MILL MEDIUM DISP (BLADE) ×2 IMPLANT
NDL HYPO 25X1 1.5 SAFETY (NEEDLE) ×1 IMPLANT
NEEDLE HYPO 25X1 1.5 SAFETY (NEEDLE) ×2 IMPLANT
NS IRRIG 1000ML POUR BTL (IV SOLUTION) ×2 IMPLANT
PACK LAMINECTOMY NEURO (CUSTOM PROCEDURE TRAY) ×2 IMPLANT
PAD ARMBOARD 7.5X6 YLW CONV (MISCELLANEOUS) ×6 IMPLANT
ROD LORD LIPPED TI 5.5X35 (Rod) ×1 IMPLANT
ROD LORD LIPPED TI 5.5X40 (Rod) ×1 IMPLANT
SCREW POLYAXIAL TULIP (Screw) ×4 IMPLANT
SCREW SHANK MOD 5.5X40 (Screw) ×4 IMPLANT
SET SCREW (Screw) ×8 IMPLANT
SET SCREW SPNE (Screw) IMPLANT
SPACER PEEK PS 25X8MM 9MM 5DEG (Spacer) ×1 IMPLANT
SPACER PEEK PS 25X9MM 9MM 5DEG (Spacer) ×1 IMPLANT
SPONGE LAP 4X18 RFD (DISPOSABLE) IMPLANT
SPONGE SURGIFOAM ABS GEL 100 (HEMOSTASIS) ×2 IMPLANT
STRIP CLOSURE SKIN 1/2X4 (GAUZE/BANDAGES/DRESSINGS) ×3 IMPLANT
SUT VIC AB 0 CT1 18XCR BRD8 (SUTURE) ×1 IMPLANT
SUT VIC AB 0 CT1 8-18 (SUTURE) ×2
SUT VIC AB 2-0 CP2 18 (SUTURE) ×2 IMPLANT
SUT VIC AB 3-0 SH 8-18 (SUTURE) ×4 IMPLANT
SYR CONTROL 10ML LL (SYRINGE) ×1 IMPLANT
TOWEL GREEN STERILE (TOWEL DISPOSABLE) ×2 IMPLANT
TOWEL GREEN STERILE FF (TOWEL DISPOSABLE) ×2 IMPLANT
TRAY FOLEY MTR SLVR 16FR STAT (SET/KITS/TRAYS/PACK) ×2 IMPLANT
WATER STERILE IRR 1000ML POUR (IV SOLUTION) ×2 IMPLANT

## 2020-08-27 NOTE — Op Note (Signed)
08/27/2020  1:29 PM  PATIENT:  [name]  75 y.o. female  PRE-OPERATIVE DIAGNOSIS: Degenerative spondylolisthesis L3-4, spinal stenosis L3-4, back pain, radiculopathy.  POST-OPERATIVE DIAGNOSIS:  same  PROCEDURE:   1. Decompressive lumbar laminectomy Hemi facetectomy and foraminotomies L3-4 requiring more work than would be required for a simple exposure of the disk for PLIF in order to adequately decompress the neural elements and address the spinal stenosis 2. Posterior lumbar interbody fusion L3-4 using peek interbody cages packed with morcellized allograft and autograft  3. Posterior fixation L3-4 using Alphatec cortical pedicle screws.  4. Intertransverse arthrodesis L3-4 using morcellized autograft and allograft.  SURGEON:  Sherley Bounds, MD  ASSISTANTS: Glenford Peers FNP  ANESTHESIA:  General  EBL: 150 ml  Total I/O In: 300 [IV NATFTDDUK:025] Out: 320 [Urine:170; Blood:150]  BLOOD ADMINISTERED:none  DRAINS: none   INDICATION FOR PROCEDURE: This patient presented with back and left leg pain. Imaging revealed spondylolisthesis with stenosis L3-4. The patient tried a reasonable attempt at conservative medical measures without relief. I recommended decompression and instrumented fusion to address the stenosis as well as the segmental  instability.  Patient understood the risks, benefits, and alternatives and potential outcomes and wished to proceed.  PROCEDURE DETAILS:  The patient was brought to the operating room. After induction of generalized endotracheal anesthesia the patient was rolled into the prone position on chest rolls and all pressure points were padded. The patient's lumbar region was cleaned and then prepped with DuraPrep and draped in the usual sterile fashion. Anesthesia was injected and then a dorsal midline incision was made and carried down to the lumbosacral fascia. The fascia was opened and the paraspinous musculature was taken down in a  subperiosteal fashion to expose L3-4 bilaterally. A self-retaining retractor was placed. Intraoperative fluoroscopy confirmed my level, and I started with placement of the L3 cortical pedicle screws. The pedicle screw entry zones were identified utilizing surface landmarks and  AP and lateral fluoroscopy. I scored the cortex with the high-speed drill and then used the hand drill to drill an upward and outward direction into the pedicle. I then tapped line to line. I then placed a 5.5 x 40 mm cortical pedicle screw into the pedicles of L3 bilaterally.    I then turned my attention to the decompression and complete lumbar laminectomies, hemi- facetectomies, and foraminotomies were performed at L3-4.  My nurse practitioner was directly involved in the decompression and exposure of the neural elements. the patient had significant spinal stenosis and this required more work than would be required for a simple exposure of the disc for posterior lumbar interbody fusion which would only require a limited laminotomy. Much more generous decompression and generous foraminotomy was undertaken in order to adequately decompress the neural elements and address the patient's leg pain. The yellow ligament was removed to expose the underlying dura and nerve roots, and generous foraminotomies were performed to adequately decompress the neural elements. Both the exiting and traversing nerve roots were decompressed on both sides until a coronary dilator passed easily along the nerve roots. Once the decompression was complete, I turned my attention to the posterior lower lumbar interbody fusion. The epidural venous vasculature was coagulated and cut sharply. Disc space was incised and the initial discectomy was performed with pituitary rongeurs. The disc space was distracted with sequential distractors to a height of 9 mm. We then used a series of scrapers and shavers to prepare the endplates for fusion. The midline was prepared with  Tommie Ard  curettes. Once the complete discectomy was finished, we packed an appropriate sized interbody cage with local autograft and morcellized allograft, gently retracted the nerve root, and tapped the cage into position at L3-4.  The midline between the cages was packed with morselized autograft and allograft. We then turned our attention to the placement of the lower pedicle screws. The pedicle screw entry zones were identified utilizing surface landmarks and fluoroscopy. I drilled into each pedicle utilizing the hand drill, and tapped each pedicle with the appropriate tap. We palpated with a ball probe to assure no break in the cortex. We then placed 5.5 x 40 mm pedicle screws into the pedicles bilaterally at L4.  My nurse practitioner assisted in placement of the pedicle screws.  We then decorticated the transverse processes and laid a mixture of morcellized autograft and allograft out over these to perform intertransverse arthrodesis at L3-4. We then placed lordotic rods into the multiaxial screw heads of the pedicle screws and locked these in position with the locking caps and anti-torque device. We then checked our construct with AP and lateral fluoroscopy. Irrigated with copious amounts of bacitracin-containing saline solution. Inspected the nerve roots once again to assure adequate decompression, lined to the dura with Gelfoam, placed powdered vancomycin into the wound, and then we closed the muscle and the fascia with 0 Vicryl. Closed the subcutaneous tissues with 2-0 Vicryl and subcuticular tissues with 3-0 Vicryl. The skin was closed with benzoin and Steri-Strips. Dressing was then applied, the patient was awakened from general anesthesia and transported to the recovery room in stable condition. At the end of the procedure all sponge, needle and instrument counts were correct.   PLAN OF CARE: admit to inpatient  PATIENT DISPOSITION:  PACU - hemodynamically stable.   Delay start of Pharmacological  VTE agent (>24hrs) due to surgical blood loss or risk of bleeding:  yes

## 2020-08-27 NOTE — H&P (Signed)
Subjective: Patient is a 75 y.o. female admitted for back and leg pain. Onset of symptoms was several months ago, gradually worsening since that time.  The pain is rated severe, unremitting, and is located at the across the lower back and radiates to LLE. The pain is described as aching and occurs all day. The symptoms have been progressive. Symptoms are exacerbated by exercise and standing. MRI or CT showed spondylolisthesis with stenosis L3-4   Past Medical History:  Diagnosis Date  . Anxiety   . Arthritis    generalized  . Asthma   . Bleeding nose   . Cancer ([place]) 2017   Lung   . COPD (chronic obstructive pulmonary disease) ([place])   . Diabetes mellitus   . Diverticulitis   . Dyspnea   . Dysrhythmia    paraxysmol atrial fibrillation   . H. pylori infection   . Heart murmur    mild AS (03/16/20 echo)  . Hypertension   . Liver disease   . Lung nodule 2017   Both lungs-pt at [facility].   Marland Kitchen Neuromuscular disorder ([place])    bilateral feet  . Parkinson disease ([place]) 2010  . Sleep apnea   . Tubular adenoma of colon 05/2007    Past Surgical History:  Procedure Laterality Date  . APPENDECTOMY    . BREAST SURGERY     implants at age 48-29 (removed 5 years later)  . CHOLECYSTECTOMY    . COLON SURGERY     colectomy and reversal after diverticular ds  . PARTIAL COLECTOMY    . PERICARDIAL WINDOW N/A 02/28/2017   Procedure: PERICARDIAL WINDOW;  Surgeon: [name], MD;  Location: [facility] OR;  Service: Thoracic;  Laterality: N/A;  . TONSILLECTOMY    . TOTAL KNEE ARTHROPLASTY     right  . VAGINAL HYSTERECTOMY      Prior to Admission medications   Medication Sig Start Date End Date Taking? Authorizing Provider  albuterol (PROVENTIL) (2.5 MG/3ML) 0.083% nebulizer solution Take 3 mLs (2.5 mg total) by nebulization every 6 (six) hours as needed for wheezing or shortness of breath. 03/17/20  Yes Spero Geralds, MD  ALPRAZolam Duanne Moron) 1 MG tablet Take 1 tablet (1 mg total) 2 (two) times daily as  needed by mouth for anxiety. Patient taking differently: Take 1 mg by mouth 3 (three) times daily as needed for anxiety. 03/05/17  Yes Gold, Wilder Glade, PA-C  Cholecalciferol (VITAMIN D3) 125 MCG (5000 UT) CAPS Take 5,000 Units by mouth daily.   Yes [provider]  diltiazem (CARDIZEM CD) 120 MG 24 hr capsule Take 1 capsule (120 mg total) by mouth daily. 02/20/20  Yes Chandrasekhar, Mahesh A, MD  fentaNYL (DURAGESIC - DOSED MCG/HR) 100 MCG/HR Place 100 mcg onto the skin every other day. 02/21/17  Yes [provider]  fentaNYL (DURAGESIC) 25 MCG/HR Place 1 patch onto the skin every other day. 07/26/20  Yes [provider]  furosemide (LASIX) 40 MG tablet Take 1 tablet (40 mg total) by mouth daily. Patient taking differently: Take 40 mg by mouth 2 (two) times daily. 02/25/20  Yes Chandrasekhar, Mahesh A, MD  HUMALOG KWIKPEN 100 UNIT/ML KiwkPen Inject 0.12 mLs (12 Units total) into the skin 3 (three) times daily before meals. Along with your normal sliding scale. Patient taking differently: Inject 10-20 Units into the skin 3 (three) times daily before meals. Along with your normal sliding scale. 06/20/17  Yes Lorella Nimrod, MD  hydrocortisone (CORTEF) 5 MG tablet Take 10-15 mg by  mouth See admin instructions. Take 3 tablets (15 mg) in the morning and 2 tablets (10 mg) every night. 03/15/19  Yes [provider]  insulin glargine (LANTUS) 100 UNIT/ML injection Inject 40 Units into the skin 2 (two) times daily.   Yes [provider]  methylphenidate (RITALIN) 10 MG tablet Take 10 mg by mouth 2 (two) times daily as needed (focus). 07/17/19  Yes [provider]  omeprazole (PRILOSEC) 20 MG capsule Take 20 mg by mouth daily as needed (acid reflux). 05/04/19  Yes [provider]  Oxycodone HCl 10 MG TABS Take 10 mg by mouth every 2 (two) hours as needed for pain. 02/21/17  Yes [provider]  potassium chloride (KLOR-CON) 10 MEQ tablet Take 10 mEq  by mouth daily. 06/10/19  Yes [provider]  pregabalin (LYRICA) 50 MG capsule Take 50 mg by mouth 2 (two) times daily. 02/05/20  Yes [provider]  pregabalin (LYRICA) 75 MG capsule Take 75 mg by mouth at bedtime. 02/05/20  Yes [provider]  promethazine (PHENERGAN) 12.5 MG tablet Take 12.5 mg by mouth every 6 (six) hours as needed for nausea or vomiting. 09/19/18  Yes [provider]  sertraline (ZOLOFT) 100 MG tablet Take 100 mg by mouth daily. 06/29/13  Yes [provider]  SitaGLIPtin-MetFORMIN HCl (JANUMET XR) 50-1000 MG TB24 Take 1 tablet by mouth in the morning and at bedtime.   Yes [provider]  TRELEGY ELLIPTA 100-62.5-25 MCG/INH AEPB Take 1 puff by mouth daily. 07/09/20  Yes Spero Geralds, MD  albuterol (VENTOLIN HFA) 108 (90 Base) MCG/ACT inhaler Inhale 2 puffs into the lungs every 6 (six) hours as needed for wheezing or shortness of breath. 06/14/20   Spero Geralds, MD  cyanocobalamin (,VITAMIN B-12,) 1000 MCG/ML injection Inject 1,000 mcg into the muscle every 30 (thirty) days.    [provider]  doxycycline (VIBRA-TABS) 100 MG tablet Take 1 tablet (100 mg total) by mouth 2 (two) times daily. Patient not taking: Reported on 08/18/2020 07/12/20   Martyn Ehrich, NP   Allergies  Allergen Reactions  . Morphine And Related Nausea And Vomiting and Other (See Comments)    Headache (also)  . Tape Rash    Can only tolerate for short amounts of time (CANNOT BE "LEFT" ON THE SAME SITE FOR LONG)    Social History   Tobacco Use  . Smoking status: Former Smoker    Packs/day: 1.00    Years: 28.00    Pack years: 28.00    Quit date: 03/20/1987    Years since quitting: 33.4  . Smokeless tobacco: Never Used  Substance Use Topics  . Alcohol use: Yes    Comment: 2 margaritas a year    Family History  Problem Relation Age of Onset  . Diabetes Mellitus II Sister   . Diabetes Mellitus II Son   . Colon cancer Neg Hx       Review of Systems  Positive ROS: neg  All other systems have been reviewed and were otherwise negative with the exception of those mentioned in the HPI and as above.  Objective: Vital signs in last 24 hours: Temp:  [98.6 F (37 C)] 98.6 F (37 C) (05/06 0840) Pulse Rate:  [90] 90 (05/06 0840) Resp:  [18] 18 (05/06 0840) BP: (104)/(40) 104/40 (05/06 0840) SpO2:  [95 %] 95 % (05/06 0840) Weight:  [122.5 kg] 122.5 kg (05/06 0840)  General Appearance: Alert, cooperative, no distress, appears stated  age Head: Normocephalic, without obvious abnormality, atraumatic Eyes: PERRL, conjunctiva/corneas clear, EOM's intact    Neck: Supple, symmetrical, trachea midline Back: Symmetric, no curvature, ROM normal, no CVA tenderness Lungs:  respirations unlabored Heart: Regular rate and rhythm Abdomen: Soft, non-tender Extremities: Extremities normal, atraumatic, no cyanosis or edema Pulses: 2+ and symmetric all extremities Skin: Skin color, texture, turgor normal, no rashes or lesions  NEUROLOGIC:   Mental status: Alert and oriented x4,  no aphasia, good attention span, fund of knowledge, and memory Motor Exam - grossly normal Sensory Exam - grossly normal Reflexes: trace Coordination - grossly normal Gait - grossly normal Balance - grossly normal Cranial Nerves: I: smell Not tested  II: visual acuity  OS: nl    OD: nl  II: visual fields Full to confrontation  II: pupils Equal, round, reactive to light  III,VII: ptosis None  III,IV,VI: extraocular muscles  Full ROM  V: mastication Normal  V: facial light touch sensation  Normal  V,VII: corneal reflex  Present  VII: facial muscle function - upper  Normal  VII: facial muscle function - lower Normal  VIII: hearing Not tested  IX: soft palate elevation  Normal  IX,X: gag reflex Present  XI: trapezius strength  5/5  XI: sternocleidomastoid strength 5/5  XI: neck flexion strength  5/5  XII: tongue strength  Normal    Data  Review Lab Results  Component Value Date   WBC 8.7 08/24/2020   HGB 12.1 08/24/2020   HCT 38.1 08/24/2020   MCV 85.2 08/24/2020   PLT 220 08/24/2020   Lab Results  Component Value Date   NA 134 (L) 08/24/2020   K 4.3 08/24/2020   CL 98 08/24/2020   CO2 23 08/24/2020   BUN 15 08/24/2020   CREATININE 1.08 (H) 08/24/2020   GLUCOSE 331 (H) 08/24/2020   Lab Results  Component Value Date   INR 0.9 08/24/2020    Assessment/Plan:  Estimated body mass index is 46.35 kg/m as calculated from the following:   Height as of this encounter: 5\' 4"  (1.626 m).   Weight as of this encounter: 122.5 kg. Patient admitted for PLIF L3-4. Patient has failed a reasonable attempt at conservative therapy.  I explained the condition and procedure to the patient and answered any questions.  Patient wishes to proceed with procedure as planned. Understands risks/ benefits and typical outcomes of procedure.   Eustace Moore 08/27/2020 10:06 AM

## 2020-08-27 NOTE — Anesthesia Procedure Notes (Signed)
Procedure Name: Intubation Date/Time: 08/27/2020 10:26 AM Performed by: Reece Agar, CRNA Pre-anesthesia Checklist: Patient identified, Emergency Drugs available, Suction available and Patient being monitored Patient Re-evaluated:Patient Re-evaluated prior to induction Oxygen Delivery Method: Circle System Utilized Preoxygenation: Pre-oxygenation with 100% oxygen Induction Type: IV induction Ventilation: Mask ventilation without difficulty Laryngoscope Size: Mac and 3 Grade View: Grade I Tube type: Oral Tube size: 7.0 mm Number of attempts: 1 Airway Equipment and Method: Stylet Placement Confirmation: ETT inserted through vocal cords under direct vision,  positive ETCO2 and breath sounds checked- equal and bilateral Secured at: 21 cm Tube secured with: Tape Dental Injury: Teeth and Oropharynx as per pre-operative assessment

## 2020-08-27 NOTE — Progress Notes (Signed)
Removed pt's Fentanyl patches from right arm. Wasted in sharps container. Witnessed by Alvy Beal.

## 2020-08-27 NOTE — Transfer of Care (Signed)
Immediate Anesthesia Transfer of Care Note  Patient: [name]  Procedure(s) Performed: Posterior Lumbar Interbody Fusion  - Lumbar three-Lumbar four (N/A Back)  Patient Location: PACU  Anesthesia Type:General  Level of Consciousness: drowsy  Airway & Oxygen Therapy: Patient Spontanous Breathing and Patient connected to face mask oxygen  Post-op Assessment: Report given to RN, Post -op Vital signs reviewed and stable and Patient moving all extremities X 4  Post vital signs: Reviewed and stable  Last Vitals:  Vitals Value Taken Time  BP 130/53 08/27/20 1330  Temp    Pulse 91 08/27/20 1332  Resp 15 08/27/20 1332  SpO2 96 % 08/27/20 1332  Vitals shown include unvalidated device data.  Last Pain:  Vitals:   08/27/20 0912  TempSrc:   PainSc: 6       Patients Stated Pain Goal: 3 (55/25/89 4834)  Complications: No complications documented.

## 2020-08-28 DIAGNOSIS — Z885 Allergy status to narcotic agent status: Secondary | ICD-10-CM | POA: Diagnosis not present

## 2020-08-28 DIAGNOSIS — Z794 Long term (current) use of insulin: Secondary | ICD-10-CM | POA: Diagnosis not present

## 2020-08-28 DIAGNOSIS — J449 Chronic obstructive pulmonary disease, unspecified: Secondary | ICD-10-CM | POA: Diagnosis not present

## 2020-08-28 DIAGNOSIS — I10 Essential (primary) hypertension: Secondary | ICD-10-CM | POA: Diagnosis not present

## 2020-08-28 DIAGNOSIS — M549 Dorsalgia, unspecified: Secondary | ICD-10-CM | POA: Diagnosis not present

## 2020-08-28 DIAGNOSIS — E119 Type 2 diabetes mellitus without complications: Secondary | ICD-10-CM | POA: Diagnosis not present

## 2020-08-28 DIAGNOSIS — Z833 Family history of diabetes mellitus: Secondary | ICD-10-CM | POA: Diagnosis not present

## 2020-08-28 DIAGNOSIS — I480 Paroxysmal atrial fibrillation: Secondary | ICD-10-CM | POA: Diagnosis not present

## 2020-08-28 DIAGNOSIS — Z9049 Acquired absence of other specified parts of digestive tract: Secondary | ICD-10-CM | POA: Diagnosis not present

## 2020-08-28 DIAGNOSIS — Z7984 Long term (current) use of oral hypoglycemic drugs: Secondary | ICD-10-CM | POA: Diagnosis not present

## 2020-08-28 DIAGNOSIS — Z91048 Other nonmedicinal substance allergy status: Secondary | ICD-10-CM | POA: Diagnosis not present

## 2020-08-28 DIAGNOSIS — M48061 Spinal stenosis, lumbar region without neurogenic claudication: Secondary | ICD-10-CM | POA: Diagnosis not present

## 2020-08-28 DIAGNOSIS — Z79899 Other long term (current) drug therapy: Secondary | ICD-10-CM | POA: Diagnosis not present

## 2020-08-28 DIAGNOSIS — M4726 Other spondylosis with radiculopathy, lumbar region: Secondary | ICD-10-CM | POA: Diagnosis not present

## 2020-08-28 DIAGNOSIS — Z87891 Personal history of nicotine dependence: Secondary | ICD-10-CM | POA: Diagnosis not present

## 2020-08-28 DIAGNOSIS — G20 Parkinson's disease: Secondary | ICD-10-CM | POA: Diagnosis not present

## 2020-08-28 DIAGNOSIS — K219 Gastro-esophageal reflux disease without esophagitis: Secondary | ICD-10-CM | POA: Diagnosis not present

## 2020-08-28 DIAGNOSIS — Z7951 Long term (current) use of inhaled steroids: Secondary | ICD-10-CM | POA: Diagnosis not present

## 2020-08-28 DIAGNOSIS — Z96651 Presence of right artificial knee joint: Secondary | ICD-10-CM | POA: Diagnosis not present

## 2020-08-28 DIAGNOSIS — M4316 Spondylolisthesis, lumbar region: Secondary | ICD-10-CM | POA: Diagnosis not present

## 2020-08-28 DIAGNOSIS — Z85118 Personal history of other malignant neoplasm of bronchus and lung: Secondary | ICD-10-CM | POA: Diagnosis not present

## 2020-08-28 DIAGNOSIS — M1990 Unspecified osteoarthritis, unspecified site: Secondary | ICD-10-CM | POA: Diagnosis not present

## 2020-08-28 LAB — GLUCOSE, CAPILLARY: Glucose-Capillary: 339 mg/dL — ABNORMAL HIGH (ref 70–99)

## 2020-08-28 MED ORDER — OXYCODONE-ACETAMINOPHEN 5-325 MG PO TABS: 1.0000 | ORAL_TABLET | ORAL | Status: DC | PRN

## 2020-08-28 MED ORDER — ALBUTEROL SULFATE HFA 108 (90 BASE) MCG/ACT IN AERS
2.0000 | INHALATION_SPRAY | Freq: Four times a day (QID) | RESPIRATORY_TRACT | Status: DC | PRN
  Administered 2020-08-28: 2 via RESPIRATORY_TRACT
  Filled 2020-08-28: qty 6.7

## 2020-08-28 NOTE — Discharge Summary (Signed)
Physician Discharge Summary  Patient ID: ISYS TIETJE MRN: [id number] DOB/AGE: 29-Apr-1945 75 y.o.  Admit date: 08/27/2020 Discharge date: 08/28/2020  Admission Diagnoses: Lumbar spondylolisthesis, lumbar spinal stenosis, lumbago, lumbar radiculopathy  Discharge Diagnoses: The same Active Problems:   S/P lumbar fusion   Discharged Condition: good  Hospital Course: Dr. Ronnald Ramp performed a L3-4 decompression, instrumentation and fusion on the patient on 08/27/2020.  The patient's postoperative course was unremarkable.  On postoperative day I she requested discharge home.  She was given written and oral discharge instructions.  All questions were answered.  Of note the patient is on high-dose opioids, Xanax, Ritalin, Lyrica, etc. chronically for her lung cancer.  Consults: PT, OT, care management Significant Diagnostic Studies: None Treatments: L3-4 decompression, instrumentation and fusion. Discharge Exam: Blood pressure (!) 134/55, pulse 83, temperature 98.5 F (36.9 C), temperature source Oral, resp. rate 16, height 5\' 4"  (1.626 m), weight 122.5 kg, SpO2 96 %. The patient is alert and pleasant.  She looks well.  Her dressing is clean and dry.  Her strength is normal.  Disposition: Home  Discharge Instructions    Call MD for:  difficulty breathing, headache or visual disturbances   Complete by: As directed    Call MD for:  extreme fatigue   Complete by: As directed    Call MD for:  hives   Complete by: As directed    Call MD for:  persistant dizziness or light-headedness   Complete by: As directed    Call MD for:  persistant nausea and vomiting   Complete by: As directed    Call MD for:  redness, tenderness, or signs of infection (pain, swelling, redness, odor or green/yellow discharge around incision site)   Complete by: As directed    Call MD for:  severe uncontrolled pain   Complete by: As directed    Call MD for:  temperature >100.4   Complete by: As directed    Diet  - low sodium heart healthy   Complete by: As directed    Discharge instructions   Complete by: As directed    Call [phone] for a followup appointment. Take a stool softener while you are using pain medications.   Driving Restrictions   Complete by: As directed    Do not drive for 2 weeks.   Increase activity slowly   Complete by: As directed    Lifting restrictions   Complete by: As directed    Do not lift more than 5 pounds. No excessive bending or twisting.   May shower / Bathe   Complete by: As directed    Remove the dressing for 3 days after surgery.  You may shower, but leave the incision alone.   Remove dressing in 48 hours   Complete by: As directed      Allergies as of 08/28/2020      Reactions   Morphine And Related Nausea And Vomiting, Other (See Comments)   Headache (also)   Tape Rash   Can only tolerate for short amounts of time (CANNOT BE "LEFT" ON THE SAME SITE FOR LONG)      Medication List    STOP taking these medications   doxycycline 100 MG tablet Commonly known as: VIBRA-TABS     TAKE these medications   albuterol (2.5 MG/3ML) 0.083% nebulizer solution Commonly known as: PROVENTIL Take 3 mLs (2.5 mg total) by nebulization every 6 (six) hours as needed for wheezing or shortness of breath.   albuterol 108 (90 Base)  MCG/ACT inhaler Commonly known as: VENTOLIN HFA Inhale 2 puffs into the lungs every 6 (six) hours as needed for wheezing or shortness of breath.   ALPRAZolam 1 MG tablet Commonly known as: XANAX Take 1 tablet (1 mg total) 2 (two) times daily as needed by mouth for anxiety. What changed: when to take this   cyanocobalamin 1000 MCG/ML injection Commonly known as: (VITAMIN B-12) Inject 1,000 mcg into the muscle every 30 (thirty) days.   diltiazem 120 MG 24 hr capsule Commonly known as: CARDIZEM CD Take 1 capsule (120 mg total) by mouth daily.   fentaNYL 100 MCG/HR Commonly known as: DURAGESIC Place 100 mcg onto the skin every  other day.   fentaNYL 25 MCG/HR Commonly known as: [place] 1 patch onto the skin every other day.   furosemide 40 MG tablet Commonly known as: LASIX Take 1 tablet (40 mg total) by mouth daily. What changed: when to take this   HumaLOG KwikPen 100 UNIT/ML KwikPen Generic drug: insulin lispro Inject 0.12 mLs (12 Units total) into the skin 3 (three) times daily before meals. Along with your normal sliding scale. What changed: how much to take   hydrocortisone 5 MG tablet Commonly known as: CORTEF Take 10-15 mg by mouth See admin instructions. Take 3 tablets (15 mg) in the morning and 2 tablets (10 mg) every night.   insulin glargine 100 UNIT/ML injection Commonly known as: LANTUS Inject 40 Units into the skin 2 (two) times daily.   Janumet XR 50-1000 MG Tb24 Generic drug: SitaGLIPtin-MetFORMIN HCl Take 1 tablet by mouth in the morning and at bedtime.   methylphenidate 10 MG tablet Commonly known as: RITALIN Take 10 mg by mouth 2 (two) times daily as needed (focus).   omeprazole 20 MG capsule Commonly known as: PRILOSEC Take 20 mg by mouth daily as needed (acid reflux).   Oxycodone HCl 10 MG Tabs Take 10 mg by mouth every 2 (two) hours as needed for pain.   potassium chloride 10 MEQ tablet Commonly known as: KLOR-CON Take 10 mEq by mouth daily.   pregabalin 50 MG capsule Commonly known as: LYRICA Take 50 mg by mouth 2 (two) times daily.   pregabalin 75 MG capsule Commonly known as: LYRICA Take 75 mg by mouth at bedtime.   promethazine 12.5 MG tablet Commonly known as: PHENERGAN Take 12.5 mg by mouth every 6 (six) hours as needed for nausea or vomiting.   sertraline 100 MG tablet Commonly known as: ZOLOFT Take 100 mg by mouth daily.   Trelegy Ellipta 100-62.5-25 MCG/INH Aepb Generic drug: Fluticasone-Umeclidin-Vilant Take 1 puff by mouth daily.   Vitamin D3 125 MCG (5000 UT) Caps Take 5,000 Units by mouth daily.            Durable Medical  Equipment  (From admission, onward)         Start     Ordered   08/27/20 1532  DME Walker rolling  Once       Question:  Patient needs a walker to treat with the following condition  Answer:  S/P lumbar fusion   08/27/20 1531   08/27/20 1532  DME 3 n 1  Once        08/27/20 1531           Signed: Ophelia Charter 08/28/2020, 9:34 AM

## 2020-08-28 NOTE — Evaluation (Signed)
Physical Therapy Evaluation Patient Details Name: [name] MRN: [id number] DOB: 1945/05/10 Today's Date: 08/28/2020   History of Present Illness  Pt is a 75 y.o. F admitted s/p PLIF L3-4 08/27/2020. Significant PMH: asthma, lung CA, COPD, dysrhythmia, HTN, Parkinson disease.  Clinical Impression  Patient evaluated by Physical Therapy with no further acute PT needs identified. Pt denies radicular pain or numbness/tingling. Pt ambulating x 380 feet with no assistive device and negotiated 3 steps with bilateral railings without physical assist. Required one standing rest break due to DOE 3/4; SpO2 92% on RA, HR 116 bpm. Education provided regarding brace use, spinal precautions, exercise recommendations, car transfer technique. All education has been completed and the patient has no further questions. No follow-up Physical Therapy or equipment needs. PT is signing off. Thank you for this referral.    Follow Up Recommendations No PT follow up    Equipment Recommendations  3in1 (PT)    Recommendations for Other Services       Precautions / Restrictions Precautions Precautions: Back;Fall Precaution Booklet Issued: Yes (comment) Precaution Comments: Verbally reviewed, provided written handout Required Braces or Orthoses: Spinal Brace Spinal Brace: Lumbar corset;Applied in sitting position Restrictions Weight Bearing Restrictions: No      Mobility  Bed Mobility               General bed mobility comments: Sitting EOB upon arrival; reviewed log roll technique    Transfers Overall transfer level: Independent Equipment used: None                Ambulation/Gait Ambulation/Gait assistance: Supervision Gait Distance (Feet): 380 Feet Assistive device: None Gait Pattern/deviations: Step-through pattern;Decreased stride length Gait velocity: decreased   General Gait Details: Slow and steady gait, no overt LOB, required one standing rest break due to  DOE  Stairs Stairs: Yes Stairs assistance: Modified independent (Device/Increase time) Stair Management: Two rails Number of Stairs: 3 General stair comments: Cues for step by step technique  Wheelchair Mobility    Modified Rankin (Stroke Patients Only)       Balance Overall balance assessment: Mild deficits observed, not formally tested                                           Pertinent Vitals/Pain Pain Assessment: Faces Faces Pain Scale: Hurts little more Pain Location: back Pain Descriptors / Indicators: Operative site guarding Pain Intervention(s): Monitored during session    Home Living Family/patient expects to be discharged to:: Private residence Living Arrangements: Children (son) Available Help at Discharge: Family (son, sister) Type of Home: House Home Access: Stairs to enter Entrance Stairs-Rails: Psychiatric nurse of Steps: 5 Home Layout: One level Home Equipment: Cane - single point      Prior Function Level of Independence: Needs assistance   Gait / Transfers Assistance Needed: "furniture walking."  ADL's / Homemaking Assistance Needed: Assist for running errands i.e. grocery shopping, can assist with cooking        Hand Dominance        Extremity/Trunk Assessment   Upper Extremity Assessment Upper Extremity Assessment: Defer to OT evaluation    Lower Extremity Assessment Lower Extremity Assessment: Overall WFL for tasks assessed    Cervical / Trunk Assessment Cervical / Trunk Assessment: Other exceptions Cervical / Trunk Exceptions: s/p PLIF  Communication   Communication: No difficulties  Cognition Arousal/Alertness: Awake/alert Behavior During Therapy: [facility]  for tasks assessed/performed Overall Cognitive Status: Within Functional Limits for tasks assessed                                        General Comments      Exercises     Assessment/Plan    PT Assessment Patent  does not need any further PT services  PT Problem List Decreased strength;Decreased activity tolerance;Decreased balance;Decreased mobility;Cardiopulmonary status limiting activity;Pain       PT Treatment Interventions      PT Goals (Current goals can be found in the Care Plan section)  Acute Rehab PT Goals Patient Stated Goal: less pain PT Goal Formulation: All assessment and education complete, DC therapy    Frequency     Barriers to discharge        Co-evaluation               AM-PAC PT "6 Clicks" Mobility  Outcome Measure Help needed turning from your back to your side while in a flat bed without using bedrails?: None Help needed moving from lying on your back to sitting on the side of a flat bed without using bedrails?: None Help needed moving to and from a bed to a chair (including a wheelchair)?: None Help needed standing up from a chair using your arms (e.g., wheelchair or bedside chair)?: None Help needed to walk in hospital room?: A Little Help needed climbing 3-5 steps with a railing? : None 6 Click Score: 23    End of Session Equipment Utilized During Treatment: Back brace;Gait belt Activity Tolerance: Patient tolerated treatment well Patient left: in bed;with call bell/phone within reach Nurse Communication: Mobility status PT Visit Diagnosis: Pain;Unsteadiness on feet (R26.81) Pain - part of body:  (back)    Time: 2440-1027 PT Time Calculation (min) (ACUTE ONLY): 20 min   Charges:   PT Evaluation $PT Eval Low Complexity: [place], PT, DPT Acute Rehabilitation Services Pager [phone] Office [place] 08/28/2020, 8:58 AM

## 2020-08-28 NOTE — Progress Notes (Signed)
Patient alert and oriented, voiding adequately, skin clean, dry and intact without evidence of skin break down, or symptoms of complications - no redness or edema noted, only slight tenderness at site.  Patient states pain is manageable at time of discharge. Patient has an appointment with MD in 2 weeks 

## 2020-08-28 NOTE — Plan of Care (Signed)
Adequately ready for discharge

## 2020-08-28 NOTE — Evaluation (Signed)
Occupational Therapy Evaluation Patient Details Name: [name] MRN: [id number] DOB: 1945/08/20 Today's Date: 08/28/2020    History of Present Illness Pt is a 75 y.o. F admitted s/p PLIF L3-4 08/27/2020. Significant PMH: asthma, lung CA, COPD, dysrhythmia, HTN, Parkinson disease.   Clinical Impression   Patient admitted for the above diagnosis and procedure.  PTA she lived alone at home with PRN support from her son.  Her sister will be staying with her for one week to assist as needed.  Patient demonstrated good use of hip kit, and followed through with all precautions.  All questions answered and no further OT needs identified.      Follow Up Recommendations  No OT follow up    Equipment Recommendations  Other (comment) (hip kit)    Recommendations for Other Services       Precautions / Restrictions Precautions Precautions: Back;Fall Precaution Booklet Issued: Yes (comment) Precaution Comments: Verbally reviewed, provided written handout Required Braces or Orthoses: Spinal Brace Spinal Brace: Lumbar corset;Applied in standing position Restrictions Weight Bearing Restrictions: No      Mobility Bed Mobility               General bed mobility comments: Sitting EOB upon arrival; reviewed log roll technique    Transfers Overall transfer level: Independent Equipment used: None                  Balance Overall balance assessment: Mild deficits observed, not formally tested                                         ADL either performed or assessed with clinical judgement   ADL Overall ADL's : Modified independent                                       General ADL Comments: Able to complete ADL from sit/stand level     Vision Patient Visual Report: No change from baseline       Perception     Praxis      Pertinent Vitals/Pain Pain Assessment: 0-10 Pain Score: 3  Faces Pain Scale: Hurts little more Pain  Location: back Pain Descriptors / Indicators: Operative site guarding Pain Intervention(s): Monitored during session     Hand Dominance Right   Extremity/Trunk Assessment Upper Extremity Assessment Upper Extremity Assessment: Overall WFL for tasks assessed   Lower Extremity Assessment Lower Extremity Assessment: Defer to PT evaluation   Cervical / Trunk Assessment Cervical / Trunk Assessment: Other exceptions Cervical / Trunk Exceptions: spinal surgery   Communication Communication Communication: No difficulties   Cognition Arousal/Alertness: Awake/alert Behavior During Therapy: WFL for tasks assessed/performed Overall Cognitive Status: Within Functional Limits for tasks assessed                                       Home Living Family/patient expects to be discharged to:: Private residence Living Arrangements: Children Available Help at Discharge: Family Type of Home: House Home Access: Stairs to enter Technical brewer of Steps: 5 Entrance Stairs-Rails: Right;Left Home Layout: One level     Bathroom Shower/Tub: Teacher, early years/pre: Standard     Home Equipment: Cane - single point  Prior Functioning/Environment Level of Independence: Needs assistance  Gait / Transfers Assistance Needed: "furniture walking." ADL's / Homemaking Assistance Needed: Assist for running errands i.e. grocery shopping, can assist with cooking            OT Problem List: Pain;Impaired balance (sitting and/or standing)      OT Treatment/Interventions:      OT Goals(Current goals can be found in the care plan section) Acute Rehab OT Goals Patient Stated Goal: Be able to do more for myself OT Goal Formulation: With patient Time For Goal Achievement: 08/28/20 Potential to Achieve Goals: Good  OT Frequency:     Barriers to D/[name]:            Co-evaluation              AM-PAC OT "6 Clicks" Daily Activity     Outcome Measure  Help from another person eating meals?: None Help from another person taking care of personal grooming?: None Help from another person toileting, which includes using toliet, bedpan, or urinal?: None Help from another person bathing (including washing, rinsing, drying)?: None Help from another person to put on and taking off regular upper body clothing?: None Help from another person to put on and taking off regular lower body clothing?: None 6 Click Score: 24   End of Session Equipment Utilized During Treatment: Back brace Nurse Communication: Mobility status  Activity Tolerance: Patient tolerated treatment well Patient left: in chair;with call bell/phone within reach;with family/visitor present  OT Visit Diagnosis: Unsteadiness on feet (R26.81)                Time: 3496-1164 OT Time Calculation (min): 19 min Charges:  OT General Charges $OT Visit: 1 Visit OT Evaluation $OT Eval Moderate Complexity: 1 Mod  08/28/2020  [name], [name]  Acute Rehabilitation Services  Office:  [phone]   [name] 08/28/2020, 9:26 AM

## 2020-08-29 DIAGNOSIS — G4733 Obstructive sleep apnea (adult) (pediatric): Secondary | ICD-10-CM | POA: Diagnosis not present

## 2020-09-01 NOTE — Anesthesia Postprocedure Evaluation (Signed)
Anesthesia Post Note  Patient: [name]  Procedure(s) Performed: Posterior Lumbar Interbody Fusion  - Lumbar three-Lumbar four (N/A Back)     Patient location during evaluation: PACU Anesthesia Type: General Level of consciousness: awake Pain management: pain level controlled Vital Signs Assessment: post-procedure vital signs reviewed and stable Respiratory status: spontaneous breathing Cardiovascular status: stable Postop Assessment: no apparent nausea or vomiting Anesthetic complications: no   No complications documented.  Last Vitals:  Vitals:   08/28/20 0319 08/28/20 0719  BP: (!) 150/92 (!) 134/55  Pulse: 77 83  Resp: 18 16  Temp: 37.1 C 36.9 C  SpO2: 96% 96%    Last Pain:  Vitals:   08/28/20 0719  TempSrc: Oral  PainSc:                  Arath Kaigler

## 2020-09-29 DIAGNOSIS — G4733 Obstructive sleep apnea (adult) (pediatric): Secondary | ICD-10-CM | POA: Diagnosis not present

## 2020-10-04 DIAGNOSIS — H25811 Combined forms of age-related cataract, right eye: Secondary | ICD-10-CM | POA: Diagnosis not present

## 2020-10-04 DIAGNOSIS — H25813 Combined forms of age-related cataract, bilateral: Secondary | ICD-10-CM | POA: Diagnosis not present

## 2020-10-07 DIAGNOSIS — M4316 Spondylolisthesis, lumbar region: Secondary | ICD-10-CM | POA: Diagnosis not present

## 2020-10-19 DIAGNOSIS — H25811 Combined forms of age-related cataract, right eye: Secondary | ICD-10-CM | POA: Diagnosis not present

## 2020-10-19 DIAGNOSIS — H268 Other specified cataract: Secondary | ICD-10-CM | POA: Diagnosis not present

## 2020-10-29 DIAGNOSIS — G4733 Obstructive sleep apnea (adult) (pediatric): Secondary | ICD-10-CM | POA: Diagnosis not present

## 2020-11-01 DIAGNOSIS — H25812 Combined forms of age-related cataract, left eye: Secondary | ICD-10-CM | POA: Diagnosis not present

## 2020-11-15 ENCOUNTER — Other Ambulatory Visit: Payer: Self-pay | Admitting: Internal Medicine

## 2020-11-15 DIAGNOSIS — I313 Pericardial effusion (noninflammatory): Secondary | ICD-10-CM

## 2020-11-15 DIAGNOSIS — E119 Type 2 diabetes mellitus without complications: Secondary | ICD-10-CM

## 2020-11-15 DIAGNOSIS — G4733 Obstructive sleep apnea (adult) (pediatric): Secondary | ICD-10-CM

## 2020-11-15 DIAGNOSIS — I3139 Other pericardial effusion (noninflammatory): Secondary | ICD-10-CM

## 2020-11-15 DIAGNOSIS — D509 Iron deficiency anemia, unspecified: Secondary | ICD-10-CM

## 2020-11-15 DIAGNOSIS — C3490 Malignant neoplasm of unspecified part of unspecified bronchus or lung: Secondary | ICD-10-CM

## 2020-11-15 DIAGNOSIS — I10 Essential (primary) hypertension: Secondary | ICD-10-CM

## 2020-11-15 DIAGNOSIS — I480 Paroxysmal atrial fibrillation: Secondary | ICD-10-CM

## 2020-11-16 DIAGNOSIS — H25812 Combined forms of age-related cataract, left eye: Secondary | ICD-10-CM | POA: Diagnosis not present

## 2020-11-16 DIAGNOSIS — H268 Other specified cataract: Secondary | ICD-10-CM | POA: Diagnosis not present

## 2020-11-17 ENCOUNTER — Other Ambulatory Visit: Payer: Self-pay | Admitting: Internal Medicine

## 2020-11-29 DIAGNOSIS — G4733 Obstructive sleep apnea (adult) (pediatric): Secondary | ICD-10-CM | POA: Diagnosis not present

## 2020-12-02 DIAGNOSIS — E2740 Unspecified adrenocortical insufficiency: Secondary | ICD-10-CM | POA: Diagnosis not present

## 2020-12-02 DIAGNOSIS — M25519 Pain in unspecified shoulder: Secondary | ICD-10-CM | POA: Diagnosis not present

## 2020-12-02 DIAGNOSIS — Z79899 Other long term (current) drug therapy: Secondary | ICD-10-CM | POA: Diagnosis not present

## 2020-12-02 DIAGNOSIS — C7951 Secondary malignant neoplasm of bone: Secondary | ICD-10-CM | POA: Diagnosis not present

## 2020-12-02 DIAGNOSIS — G8929 Other chronic pain: Secondary | ICD-10-CM | POA: Diagnosis not present

## 2020-12-02 DIAGNOSIS — C3411 Malignant neoplasm of upper lobe, right bronchus or lung: Secondary | ICD-10-CM | POA: Diagnosis not present

## 2020-12-02 DIAGNOSIS — Z85830 Personal history of malignant neoplasm of bone: Secondary | ICD-10-CM | POA: Diagnosis not present

## 2020-12-02 DIAGNOSIS — Z9049 Acquired absence of other specified parts of digestive tract: Secondary | ICD-10-CM | POA: Diagnosis not present

## 2020-12-02 DIAGNOSIS — J219 Acute bronchiolitis, unspecified: Secondary | ICD-10-CM | POA: Diagnosis not present

## 2020-12-02 DIAGNOSIS — Z85118 Personal history of other malignant neoplasm of bronchus and lung: Secondary | ICD-10-CM | POA: Diagnosis not present

## 2020-12-02 DIAGNOSIS — K5903 Drug induced constipation: Secondary | ICD-10-CM | POA: Diagnosis not present

## 2020-12-02 DIAGNOSIS — E119 Type 2 diabetes mellitus without complications: Secondary | ICD-10-CM | POA: Diagnosis not present

## 2020-12-02 DIAGNOSIS — Z923 Personal history of irradiation: Secondary | ICD-10-CM | POA: Diagnosis not present

## 2020-12-02 DIAGNOSIS — R530 Neoplastic (malignant) related fatigue: Secondary | ICD-10-CM | POA: Diagnosis not present

## 2020-12-02 DIAGNOSIS — Z8711 Personal history of peptic ulcer disease: Secondary | ICD-10-CM | POA: Diagnosis not present

## 2020-12-02 DIAGNOSIS — M5442 Lumbago with sciatica, left side: Secondary | ICD-10-CM | POA: Diagnosis not present

## 2020-12-02 DIAGNOSIS — J439 Emphysema, unspecified: Secondary | ICD-10-CM | POA: Diagnosis not present

## 2020-12-02 DIAGNOSIS — R590 Localized enlarged lymph nodes: Secondary | ICD-10-CM | POA: Diagnosis not present

## 2020-12-02 DIAGNOSIS — Z515 Encounter for palliative care: Secondary | ICD-10-CM | POA: Diagnosis not present

## 2020-12-02 DIAGNOSIS — M545 Low back pain, unspecified: Secondary | ICD-10-CM | POA: Diagnosis not present

## 2020-12-02 DIAGNOSIS — T402X5A Adverse effect of other opioids, initial encounter: Secondary | ICD-10-CM | POA: Diagnosis not present

## 2020-12-02 DIAGNOSIS — M792 Neuralgia and neuritis, unspecified: Secondary | ICD-10-CM | POA: Diagnosis not present

## 2020-12-02 DIAGNOSIS — Z87891 Personal history of nicotine dependence: Secondary | ICD-10-CM | POA: Diagnosis not present

## 2020-12-02 DIAGNOSIS — R911 Solitary pulmonary nodule: Secondary | ICD-10-CM | POA: Diagnosis not present

## 2020-12-02 DIAGNOSIS — Z08 Encounter for follow-up examination after completed treatment for malignant neoplasm: Secondary | ICD-10-CM | POA: Diagnosis not present

## 2020-12-02 DIAGNOSIS — G893 Neoplasm related pain (acute) (chronic): Secondary | ICD-10-CM | POA: Diagnosis not present

## 2020-12-16 DIAGNOSIS — M4316 Spondylolisthesis, lumbar region: Secondary | ICD-10-CM | POA: Diagnosis not present

## 2020-12-16 DIAGNOSIS — G8929 Other chronic pain: Secondary | ICD-10-CM | POA: Diagnosis not present

## 2020-12-16 DIAGNOSIS — M25562 Pain in left knee: Secondary | ICD-10-CM | POA: Diagnosis not present

## 2020-12-17 ENCOUNTER — Other Ambulatory Visit: Payer: Self-pay | Admitting: Neurological Surgery

## 2020-12-17 DIAGNOSIS — M25562 Pain in left knee: Secondary | ICD-10-CM

## 2020-12-30 DIAGNOSIS — G4733 Obstructive sleep apnea (adult) (pediatric): Secondary | ICD-10-CM | POA: Diagnosis not present

## 2021-01-06 DIAGNOSIS — Z515 Encounter for palliative care: Secondary | ICD-10-CM | POA: Diagnosis not present

## 2021-01-06 DIAGNOSIS — M792 Neuralgia and neuritis, unspecified: Secondary | ICD-10-CM | POA: Diagnosis not present

## 2021-01-06 DIAGNOSIS — R530 Neoplastic (malignant) related fatigue: Secondary | ICD-10-CM | POA: Diagnosis not present

## 2021-01-06 DIAGNOSIS — C3411 Malignant neoplasm of upper lobe, right bronchus or lung: Secondary | ICD-10-CM | POA: Diagnosis not present

## 2021-01-06 DIAGNOSIS — G8929 Other chronic pain: Secondary | ICD-10-CM | POA: Diagnosis not present

## 2021-01-06 DIAGNOSIS — G893 Neoplasm related pain (acute) (chronic): Secondary | ICD-10-CM | POA: Diagnosis not present

## 2021-01-06 DIAGNOSIS — Z79899 Other long term (current) drug therapy: Secondary | ICD-10-CM | POA: Diagnosis not present

## 2021-01-06 DIAGNOSIS — T402X5A Adverse effect of other opioids, initial encounter: Secondary | ICD-10-CM | POA: Diagnosis not present

## 2021-01-06 DIAGNOSIS — Z87891 Personal history of nicotine dependence: Secondary | ICD-10-CM | POA: Diagnosis not present

## 2021-01-06 DIAGNOSIS — K5903 Drug induced constipation: Secondary | ICD-10-CM | POA: Diagnosis not present

## 2021-01-06 DIAGNOSIS — M5442 Lumbago with sciatica, left side: Secondary | ICD-10-CM | POA: Diagnosis not present

## 2021-01-08 ENCOUNTER — Ambulatory Visit
Admission: RE | Admit: 2021-01-08 | Discharge: 2021-01-08 | Disposition: A | Discharge status: Home / Self Care | Payer: Medicare Other | Source: Ambulatory Visit | Attending: Neurological Surgery | Admitting: Neurological Surgery

## 2021-01-08 ENCOUNTER — Other Ambulatory Visit: Payer: Self-pay

## 2021-01-08 DIAGNOSIS — G8929 Other chronic pain: Secondary | ICD-10-CM

## 2021-01-08 DIAGNOSIS — M25562 Pain in left knee: Secondary | ICD-10-CM

## 2021-01-08 DIAGNOSIS — M23322 Other meniscus derangements, posterior horn of medial meniscus, left knee: Secondary | ICD-10-CM | POA: Diagnosis not present

## 2021-01-29 DIAGNOSIS — G4733 Obstructive sleep apnea (adult) (pediatric): Secondary | ICD-10-CM | POA: Diagnosis not present

## 2021-02-09 DIAGNOSIS — S83242A Other tear of medial meniscus, current injury, left knee, initial encounter: Secondary | ICD-10-CM | POA: Diagnosis not present

## 2021-02-17 ENCOUNTER — Other Ambulatory Visit: Payer: Self-pay | Admitting: Internal Medicine

## 2021-04-07 DIAGNOSIS — T402X5A Adverse effect of other opioids, initial encounter: Secondary | ICD-10-CM | POA: Diagnosis not present

## 2021-04-07 DIAGNOSIS — M5442 Lumbago with sciatica, left side: Secondary | ICD-10-CM | POA: Diagnosis not present

## 2021-04-07 DIAGNOSIS — R251 Tremor, unspecified: Secondary | ICD-10-CM | POA: Diagnosis not present

## 2021-04-07 DIAGNOSIS — M792 Neuralgia and neuritis, unspecified: Secondary | ICD-10-CM | POA: Diagnosis not present

## 2021-04-07 DIAGNOSIS — K5903 Drug induced constipation: Secondary | ICD-10-CM | POA: Diagnosis not present

## 2021-04-07 DIAGNOSIS — G8929 Other chronic pain: Secondary | ICD-10-CM | POA: Diagnosis not present

## 2021-04-22 DIAGNOSIS — R051 Acute cough: Secondary | ICD-10-CM | POA: Diagnosis not present

## 2021-04-22 DIAGNOSIS — K219 Gastro-esophageal reflux disease without esophagitis: Secondary | ICD-10-CM | POA: Diagnosis not present

## 2021-04-22 DIAGNOSIS — Z20822 Contact with and (suspected) exposure to covid-19: Secondary | ICD-10-CM | POA: Diagnosis not present

## 2021-04-22 DIAGNOSIS — E1165 Type 2 diabetes mellitus with hyperglycemia: Secondary | ICD-10-CM | POA: Diagnosis not present

## 2021-04-22 DIAGNOSIS — J449 Chronic obstructive pulmonary disease, unspecified: Secondary | ICD-10-CM | POA: Diagnosis not present

## 2021-04-22 DIAGNOSIS — R0981 Nasal congestion: Secondary | ICD-10-CM | POA: Diagnosis not present

## 2021-04-22 DIAGNOSIS — E538 Deficiency of other specified B group vitamins: Secondary | ICD-10-CM | POA: Diagnosis not present

## 2021-05-05 DIAGNOSIS — E538 Deficiency of other specified B group vitamins: Secondary | ICD-10-CM | POA: Diagnosis not present

## 2021-05-12 DIAGNOSIS — D510 Vitamin B12 deficiency anemia due to intrinsic factor deficiency: Secondary | ICD-10-CM | POA: Diagnosis not present

## 2021-05-17 ENCOUNTER — Other Ambulatory Visit: Payer: Self-pay | Admitting: Internal Medicine

## 2021-05-17 DIAGNOSIS — D509 Iron deficiency anemia, unspecified: Secondary | ICD-10-CM

## 2021-05-17 DIAGNOSIS — I10 Essential (primary) hypertension: Secondary | ICD-10-CM

## 2021-05-17 DIAGNOSIS — I3139 Other pericardial effusion (noninflammatory): Secondary | ICD-10-CM

## 2021-05-17 DIAGNOSIS — C3490 Malignant neoplasm of unspecified part of unspecified bronchus or lung: Secondary | ICD-10-CM

## 2021-05-17 DIAGNOSIS — J449 Chronic obstructive pulmonary disease, unspecified: Secondary | ICD-10-CM

## 2021-05-17 DIAGNOSIS — I480 Paroxysmal atrial fibrillation: Secondary | ICD-10-CM

## 2021-05-23 DIAGNOSIS — E538 Deficiency of other specified B group vitamins: Secondary | ICD-10-CM | POA: Diagnosis not present

## 2021-05-31 DIAGNOSIS — E559 Vitamin D deficiency, unspecified: Secondary | ICD-10-CM | POA: Diagnosis not present

## 2021-06-09 DIAGNOSIS — M79605 Pain in left leg: Secondary | ICD-10-CM | POA: Diagnosis not present

## 2021-06-09 DIAGNOSIS — E119 Type 2 diabetes mellitus without complications: Secondary | ICD-10-CM | POA: Diagnosis not present

## 2021-06-09 DIAGNOSIS — M954 Acquired deformity of chest and rib: Secondary | ICD-10-CM | POA: Diagnosis not present

## 2021-06-09 DIAGNOSIS — Z08 Encounter for follow-up examination after completed treatment for malignant neoplasm: Secondary | ICD-10-CM | POA: Diagnosis not present

## 2021-06-09 DIAGNOSIS — R251 Tremor, unspecified: Secondary | ICD-10-CM | POA: Diagnosis not present

## 2021-06-09 DIAGNOSIS — Z515 Encounter for palliative care: Secondary | ICD-10-CM | POA: Diagnosis not present

## 2021-06-09 DIAGNOSIS — Z923 Personal history of irradiation: Secondary | ICD-10-CM | POA: Diagnosis not present

## 2021-06-09 DIAGNOSIS — Z8616 Personal history of COVID-19: Secondary | ICD-10-CM | POA: Diagnosis not present

## 2021-06-09 DIAGNOSIS — M545 Low back pain, unspecified: Secondary | ICD-10-CM | POA: Diagnosis not present

## 2021-06-09 DIAGNOSIS — M25512 Pain in left shoulder: Secondary | ICD-10-CM | POA: Diagnosis not present

## 2021-06-09 DIAGNOSIS — C3411 Malignant neoplasm of upper lobe, right bronchus or lung: Secondary | ICD-10-CM | POA: Diagnosis not present

## 2021-06-09 DIAGNOSIS — Z79899 Other long term (current) drug therapy: Secondary | ICD-10-CM | POA: Diagnosis not present

## 2021-06-09 DIAGNOSIS — M19012 Primary osteoarthritis, left shoulder: Secondary | ICD-10-CM | POA: Diagnosis not present

## 2021-06-09 DIAGNOSIS — Z85830 Personal history of malignant neoplasm of bone: Secondary | ICD-10-CM | POA: Diagnosis not present

## 2021-06-09 DIAGNOSIS — J438 Other emphysema: Secondary | ICD-10-CM | POA: Diagnosis not present

## 2021-06-09 DIAGNOSIS — R918 Other nonspecific abnormal finding of lung field: Secondary | ICD-10-CM | POA: Diagnosis not present

## 2021-06-09 DIAGNOSIS — T402X5A Adverse effect of other opioids, initial encounter: Secondary | ICD-10-CM | POA: Diagnosis not present

## 2021-06-09 DIAGNOSIS — R530 Neoplastic (malignant) related fatigue: Secondary | ICD-10-CM | POA: Diagnosis not present

## 2021-06-09 DIAGNOSIS — M47812 Spondylosis without myelopathy or radiculopathy, cervical region: Secondary | ICD-10-CM | POA: Diagnosis not present

## 2021-06-09 DIAGNOSIS — E2740 Unspecified adrenocortical insufficiency: Secondary | ICD-10-CM | POA: Diagnosis not present

## 2021-06-09 DIAGNOSIS — Z85118 Personal history of other malignant neoplasm of bronchus and lung: Secondary | ICD-10-CM | POA: Diagnosis not present

## 2021-06-09 DIAGNOSIS — J432 Centrilobular emphysema: Secondary | ICD-10-CM | POA: Diagnosis not present

## 2021-06-09 DIAGNOSIS — G893 Neoplasm related pain (acute) (chronic): Secondary | ICD-10-CM | POA: Diagnosis not present

## 2021-06-09 DIAGNOSIS — C7951 Secondary malignant neoplasm of bone: Secondary | ICD-10-CM | POA: Diagnosis not present

## 2021-06-09 DIAGNOSIS — Z87891 Personal history of nicotine dependence: Secondary | ICD-10-CM | POA: Diagnosis not present

## 2021-06-09 DIAGNOSIS — K5903 Drug induced constipation: Secondary | ICD-10-CM | POA: Diagnosis not present

## 2021-06-27 DIAGNOSIS — E538 Deficiency of other specified B group vitamins: Secondary | ICD-10-CM | POA: Diagnosis not present

## 2021-07-01 DIAGNOSIS — H6522 Chronic serous otitis media, left ear: Secondary | ICD-10-CM | POA: Diagnosis not present

## 2021-07-01 DIAGNOSIS — H6983 Other specified disorders of Eustachian tube, bilateral: Secondary | ICD-10-CM | POA: Diagnosis not present

## 2021-07-01 DIAGNOSIS — H9193 Unspecified hearing loss, bilateral: Secondary | ICD-10-CM | POA: Diagnosis not present

## 2021-07-21 DIAGNOSIS — J449 Chronic obstructive pulmonary disease, unspecified: Secondary | ICD-10-CM | POA: Diagnosis not present

## 2021-07-21 DIAGNOSIS — N390 Urinary tract infection, site not specified: Secondary | ICD-10-CM | POA: Diagnosis not present

## 2021-07-21 DIAGNOSIS — E1165 Type 2 diabetes mellitus with hyperglycemia: Secondary | ICD-10-CM | POA: Diagnosis not present

## 2021-07-21 DIAGNOSIS — R251 Tremor, unspecified: Secondary | ICD-10-CM | POA: Diagnosis not present

## 2021-07-21 DIAGNOSIS — R8290 Unspecified abnormal findings in urine: Secondary | ICD-10-CM | POA: Diagnosis not present

## 2021-07-21 DIAGNOSIS — Z794 Long term (current) use of insulin: Secondary | ICD-10-CM | POA: Diagnosis not present

## 2021-07-21 DIAGNOSIS — K7581 Nonalcoholic steatohepatitis (NASH): Secondary | ICD-10-CM | POA: Diagnosis not present

## 2021-08-04 DIAGNOSIS — J45909 Unspecified asthma, uncomplicated: Secondary | ICD-10-CM | POA: Diagnosis not present

## 2021-08-04 DIAGNOSIS — J984 Other disorders of lung: Secondary | ICD-10-CM | POA: Diagnosis not present

## 2021-08-04 DIAGNOSIS — G4733 Obstructive sleep apnea (adult) (pediatric): Secondary | ICD-10-CM | POA: Diagnosis not present

## 2021-09-04 ENCOUNTER — Encounter (HOSPITAL_COMMUNITY): Payer: Self-pay

## 2021-09-04 ENCOUNTER — Other Ambulatory Visit: Payer: Self-pay

## 2021-09-04 ENCOUNTER — Emergency Department (HOSPITAL_COMMUNITY)
Admission: EM | Admit: 2021-09-04 | Discharge: 2021-09-04 | Discharge status: Against Medical Advice | Payer: Medicare Other | Attending: Emergency Medicine | Admitting: Emergency Medicine

## 2021-09-04 ENCOUNTER — Emergency Department (HOSPITAL_COMMUNITY): Payer: Medicare Other

## 2021-09-04 DIAGNOSIS — R079 Chest pain, unspecified: Secondary | ICD-10-CM | POA: Diagnosis not present

## 2021-09-04 DIAGNOSIS — Z5321 Procedure and treatment not carried out due to patient leaving prior to being seen by health care provider: Secondary | ICD-10-CM | POA: Insufficient documentation

## 2021-09-04 LAB — BASIC METABOLIC PANEL
Anion gap: 9 (ref 5–15)
BUN: 16 mg/dL (ref 8–23)
CO2: 24 mmol/L (ref 22–32)
Calcium: 9.2 mg/dL (ref 8.9–10.3)
Chloride: 106 mmol/L (ref 98–111)
Creatinine, Ser: 0.99 mg/dL (ref 0.44–1.00)
GFR, Estimated: 60 mL/min — ABNORMAL LOW (ref >60)
Glucose, Bld: 150 mg/dL — ABNORMAL HIGH (ref 70–99)
Potassium: 4.2 mmol/L (ref 3.5–5.1)
Sodium: 139 mmol/L (ref 135–145)

## 2021-09-04 LAB — CBC
HCT: 39.3 % (ref 36.0–46.0)
Hemoglobin: 12.6 g/dL (ref 12.0–15.0)
MCH: 26.5 pg (ref 26.0–34.0)
MCHC: 32.1 g/dL (ref 30.0–36.0)
MCV: 82.6 fL (ref 80.0–100.0)
Platelets: 232 10*3/uL (ref 150–400)
RBC: 4.76 MIL/uL (ref 3.87–5.11)
RDW: 15.1 % (ref 11.5–15.5)
WBC: 9.8 10*3/uL (ref 4.0–10.5)
nRBC: 0 % (ref 0.0–0.2)

## 2021-09-04 LAB — TROPONIN I (HIGH SENSITIVITY): Troponin I (High Sensitivity): 10 ng/L (ref <18)

## 2021-09-04 NOTE — ED Notes (Signed)
Called for room NAx4 ?

## 2021-09-04 NOTE — ED Triage Notes (Signed)
Pt arrived POV from home c/o left sided chest pain that started about 2 hrs ago. Pt states the pain radiates into her back and down her left arm. Pt states her son gave her 66 of ASA prior to arrival.  ?

## 2021-09-05 ENCOUNTER — Telehealth: Payer: Self-pay | Admitting: Internal Medicine

## 2021-09-05 NOTE — Telephone Encounter (Signed)
Called pt notes not currently having CP.  Rescheduled OV to tomorrow 09/06/21 at 3:30 pm.  Advised pt to report to ED if CP develops and is worrisome.  ?

## 2021-09-05 NOTE — Telephone Encounter (Signed)
Pt c/o of Chest Pain: STAT if CP now or developed within 24 hours ? ?1. Are you having CP right now? Not at this time, pain started on left side went up her left arm into her neck, jaw and eye- went to [facility] ER-see notes in Epic ? ?2. Are you experiencing any other symptoms (ex. SOB, nausea, vomiting, sweating)?  Short of breath, real nauseated ? ?3. How long have you been experiencing CP? yesterday ? ?4. Is your CP continuous or coming and going?  ? ?5. Have you taken Nitroglycerin? No- patient wanted to be get appointment- please call to evaluate- made an appt for Thursday ?? ? ?

## 2021-09-06 ENCOUNTER — Encounter: Payer: Self-pay | Admitting: Internal Medicine

## 2021-09-06 ENCOUNTER — Ambulatory Visit: Payer: Medicare Other | Admitting: Internal Medicine

## 2021-09-06 VITALS — BP 128/60 | HR 73 | Ht 63.0 in | Wt 257.0 lb

## 2021-09-06 DIAGNOSIS — R072 Precordial pain: Secondary | ICD-10-CM

## 2021-09-06 DIAGNOSIS — E119 Type 2 diabetes mellitus without complications: Secondary | ICD-10-CM

## 2021-09-06 DIAGNOSIS — I152 Hypertension secondary to endocrine disorders: Secondary | ICD-10-CM | POA: Diagnosis not present

## 2021-09-06 DIAGNOSIS — I480 Paroxysmal atrial fibrillation: Secondary | ICD-10-CM | POA: Diagnosis not present

## 2021-09-06 DIAGNOSIS — G4733 Obstructive sleep apnea (adult) (pediatric): Secondary | ICD-10-CM

## 2021-09-06 DIAGNOSIS — I3139 Other pericardial effusion (noninflammatory): Secondary | ICD-10-CM

## 2021-09-06 DIAGNOSIS — I10 Essential (primary) hypertension: Secondary | ICD-10-CM | POA: Diagnosis not present

## 2021-09-06 DIAGNOSIS — J449 Chronic obstructive pulmonary disease, unspecified: Secondary | ICD-10-CM

## 2021-09-06 DIAGNOSIS — I350 Nonrheumatic aortic (valve) stenosis: Secondary | ICD-10-CM | POA: Diagnosis not present

## 2021-09-06 DIAGNOSIS — J441 Chronic obstructive pulmonary disease with (acute) exacerbation: Secondary | ICD-10-CM | POA: Diagnosis not present

## 2021-09-06 MED ORDER — METOPROLOL TARTRATE 100 MG PO TABS: ORAL_TABLET | ORAL | 0 refills | Status: DC

## 2021-09-06 MED ORDER — NITROGLYCERIN 0.4 MG SL SUBL: 0.4000 mg | SUBLINGUAL_TABLET | SUBLINGUAL | 1 refills | Status: DC | PRN

## 2021-09-06 NOTE — Progress Notes (Signed)
?Cardiology Office Note:   ? ?Date:  09/06/2021  ? ?ID:  [name], DOB May 23, 1945, MRN [id number] ? ?PCP:  [name], MD ?  ?[facility] Providers ?Cardiologist:  Werner Lean, MD    ? ?Referring MD: [name], MD  ? ?DOD Chest pain ? ?History of Present Illness:   ? ?[name] is a 76 y.o. female with a hx of HTN with DM, HLD with DM, CAC and Aortic atherosclerosis, IDA, Morbid Obesity, OSA on COPD previously need home O2, NSCLS s/p Palliative radiation in 2018, prior Ipilumumab & Nivolumab Last in 2019 at [facility], prior malignant tamponade 2018, who established in 2021 for PAF.  Started on [facility] and was lost to follow up. ? ?Patient notes that she is doing poorly.   ?She is out of her inhalers; her insurance will not cover her inhalers until the end of the month. ?She has had two episodes of chest pain ?Recent ED eval for new resting chest pain.  First episodes this weekend went into her neck.  Second radiated down to her arm.  This did not occur when she had her lumbar fusion in 2022.  No exertional component. ?Troponin negative; left from the waiting room. ? ?She has always felts short of breath. ?No syncope ?Rare palpitations. ?Notes rare leg swelling ? ?She is minimally active: she gets SOB walking around the house.  She is able to go visit with her daughter or spend time with her grandson. ? ? ?Past Medical History:  ?Diagnosis Date  ? Anxiety   ? Arthritis   ? generalized  ? Asthma   ? Bleeding nose   ? Cancer [facility]) 2017  ? Lung   ? COPD (chronic obstructive pulmonary disease) ([place])   ? Diabetes mellitus   ? Diverticulitis   ? Dyspnea   ? Dysrhythmia   ? paraxysmol atrial fibrillation   ? H. pylori infection   ? Heart murmur   ? mild AS (03/16/20 echo)  ? Hypertension   ? Liver disease   ? Lung nodule 2017  ? Both lungs[facility].   ? Neuromuscular disorder ([place])   ? bilateral feet  ? Parkinson disease ([place]) 2010  ? Sleep apnea   ? Tubular adenoma of colon 05/2007  ? ? ?Past  Surgical History:  ?Procedure Laterality Date  ? APPENDECTOMY    ? BREAST SURGERY    ? implants at age 73-29 (removed 5 years later)  ? CHOLECYSTECTOMY    ? COLON SURGERY    ? colectomy and reversal after diverticular ds  ? PARTIAL COLECTOMY    ? PERICARDIAL WINDOW N/A 02/28/2017  ? Procedure: PERICARDIAL WINDOW;  Surgeon: [name], MD;  Location: [facility] OR;  Service: Thoracic;  Laterality: N/A;  ? TONSILLECTOMY    ? TOTAL KNEE ARTHROPLASTY    ? right  ? VAGINAL HYSTERECTOMY    ? ? ?Current Medications: ?Current Meds  ?Medication Sig  ? albuterol (PROVENTIL) (2.5 MG/3ML) 0.083% nebulizer solution Take 3 mLs (2.5 mg total) by nebulization every 6 (six) hours as needed for wheezing or shortness of breath.  ? albuterol (VENTOLIN HFA) 108 (90 Base) MCG/ACT inhaler Inhale 2 puffs into the lungs every 6 (six) hours as needed for wheezing or shortness of breath.  ? Alpha-Lipoic Acid 600 MG CAPS Take by mouth daily.  ? ALPRAZolam (XANAX) 1 MG tablet Take 1 tablet (1 mg total) 2 (two) times daily as needed by mouth for anxiety.  ? atorvastatin (LIPITOR)  10 MG tablet Take 10 mg by mouth daily.  ? carbidopa-levodopa (SINEMET IR) 25-100 MG tablet Take 1 tablet by mouth 2 (two) times daily.  ? Cholecalciferol (VITAMIN D3) 125 MCG (5000 UT) CAPS Take 5,000 Units by mouth daily.  ? diltiazem (CARDIZEM CD) 120 MG 24 hr capsule Take 1 capsule (120 mg total) by mouth daily. Please call to schedule appointment for future refills. 1st attempt  ? fentaNYL (DURAGESIC - DOSED MCG/HR) 100 MCG/HR Place 100 mcg onto the skin every other day.  ? fentaNYL (DURAGESIC) 25 MCG/HR Place 1 patch onto the skin every other day.  ? fluticasone (FLONASE) 50 MCG/ACT nasal spray as needed.  ? furosemide (LASIX) 40 MG tablet TAKE 1 TABLET(40 MG) BY MOUTH DAILY  ? HUMALOG KWIKPEN 100 UNIT/ML KiwkPen Inject 0.12 mLs (12 Units total) into the skin 3 (three) times daily before meals. Along with your normal sliding scale. (Patient taking differently: Inject  10-20 Units into the skin 3 (three) times daily before meals. Along with your normal sliding scale.)  ? hydrocortisone (CORTEF) 5 MG tablet Take 10-15 mg by mouth See admin instructions. Take 3 tablets (15 mg) in the morning and 2 tablets (10 mg) every night.  ? insulin glargine (LANTUS) 100 UNIT/ML injection Inject 40 Units into the skin 2 (two) times daily.  ? metoprolol tartrate (LOPRESSOR) 100 MG tablet Take 2 hours prior to Cardiac CT  ? nitroGLYCERIN (NITROSTAT) 0.4 MG SL tablet Place 1 tablet (0.4 mg total) under the tongue every 5 (five) minutes as needed for chest pain (may take up to 3 tablets).  ? omeprazole (PRILOSEC) 20 MG capsule Take 20 mg by mouth daily as needed (acid reflux).  ? Oxycodone HCl 10 MG TABS Take 10 mg by mouth every 2 (two) hours as needed for pain.  ? pregabalin (LYRICA) 50 MG capsule Take 50 mg by mouth 2 (two) times daily.  ? pregabalin (LYRICA) 75 MG capsule Take 75 mg by mouth at bedtime.  ? promethazine (PHENERGAN) 12.5 MG tablet Take 12.5 mg by mouth every 6 (six) hours as needed for nausea or vomiting.  ? sertraline (ZOLOFT) 100 MG tablet Take 100 mg by mouth daily.  ? TRELEGY ELLIPTA 100-62.5-25 MCG/INH AEPB Take 1 puff by mouth daily.  ?  ? ?Allergies:   Morphine and related and Tape  ? ?Social History  ? ?Socioeconomic History  ? Marital status: Single  ?  Spouse name: Not on file  ? Number of children: 5  ? Years of education: Not on file  ? Highest education level: Not on file  ?Occupational History  ? Occupation: retired  ?  Comment: textiles; [employer]  ?Tobacco Use  ? Smoking status: Former  ?  Packs/day: 1.00  ?  Years: 28.00  ?  Pack years: 28.00  ?  Types: Cigarettes  ?  Quit date: 03/20/1987  ?  Years since quitting: 34.4  ? Smokeless tobacco: Never  ?Vaping Use  ? Vaping Use: Never used  ?Substance and Sexual Activity  ? Alcohol use: Yes  ?  Comment: 2 margaritas a year  ? Drug use: No  ? Sexual activity: Never  ?Other Topics Concern  ? Not on file   ?Social History Narrative  ? Not on file  ? ?Social Determinants of Health  ? ?Financial Resource Strain: Not on file  ?Food Insecurity: Not on file  ?Transportation Needs: Not on file  ?Physical Activity: Not on file  ?Stress: Not on file  ?Social  Connections: Not on file  ?  ? ?Family History: ?The patient's family history includes Diabetes Mellitus II in her sister and son. There is no history of Colon cancer. ? ?ROS:   ?Please see the history of present illness.    ? All other systems reviewed and are negative. ? ?EKGs/Labs/Other Studies Reviewed:   ? ?The following studies were reviewed today: ? ? ?EKG:  EKG is  ordered today.  The ekg ordered today demonstrates  ?09/05/21: SR RBBB Rate 85 ? ?Cardiac Event Monitoring: ?Date: 03/12/20 ?Results: ?Patient had a min HR of 53 bpm, max HR of 146 bpm, and avg HR of 79 bpm. ?Predominant underlying rhythm was sinus rhythm. ?One run of tachycardia occurred lasting 5 beats with a max rate of 102 bpm. ?Isolated PACs were rare (<1.0%), with rare couplets present. ?Isolated PVCs were rare (<1.0%), with rare couplets and bigeminy present. ?No evidence of complete heart block. ?Triggered and diary events associated with sinus rhythm. ?  ?No malignant arrhythmias. ? ?Transthoracic Echocardiogram: ?Date: 03/16/20 ?Results: ?1. Left ventricular ejection fraction, by estimation, is 55 to 60%. The  ?left ventricle has normal function. The left ventricle has no regional  ?wall motion abnormalities. Left ventricular diastolic parameters are  ?consistent with Grade II diastolic  ?dysfunction (pseudonormalization).  ? 2. Right ventricular systolic function is mildly reduced. The right  ?ventricular size is mildly enlarged. There is mildly elevated pulmonary  ?artery systolic pressure. The estimated right ventricular systolic  ?pressure is 16.1 mmHg.  ? 3. Left atrial size was moderately dilated.  ? 4. Right atrial size was moderately dilated.  ? 5. The mitral valve is grossly normal.  No evidence of mitral valve  ?regurgitation. The mean mitral valve gradient is 3.0 mmHg with average  ?heart rate of 78 bpm. Mild MAC.  ? 6. The aortic valve is abnormal. There is moderate calcification of the

## 2021-09-06 NOTE — Patient Instructions (Addendum)
Medication Instructions:  ?Your physician has recommended you make the following change in your medication:  ?START: Nitroglycerin 0.4 mg under your tongue as needed for chest pain ? ?If you have chest pain place 1 tablet under your tongue, wait 5 min ?If chest pain continues place a 2nd tablet under your tongue, wait 5 min ?If chest pain continues place a 3rd tablet under your tongue, wait 5 min ?If chest pain continues call 911  ? ?*If you need a refill on your cardiac medications before your next appointment, please call your pharmacy* ? ? ?Lab Work: ?TODAY: BMP, BNP ?If you have labs (blood work) drawn today and your tests are completely normal, you will receive your results only by: ?MyChart Message (if you have MyChart) OR ?A paper copy in the mail ?If you have any lab test that is abnormal or we need to change your treatment, we will call you to review the results. ? ? ?Testing/Procedures: ?Your physician has requested that you have an echocardiogram. Echocardiography is a painless test that uses sound waves to create images of your heart. It provides your doctor with information about the size and shape of your heart and how well your heart?s chambers and valves are working. This procedure takes approximately one hour. There are no restrictions for this procedure. ? ?Your physician has requested that you have cardiac CT. Cardiac computed tomography (CT) is a painless test that uses an x-ray machine to take clear, detailed pictures of your heart.  Please follow instruction sheet as given. ?  ? ? ?Follow-Up: ?At [facility], you and your health needs are our priority.  As part of our continuing mission to provide you with exceptional heart care, we have created designated Provider Care Teams.  These Care Teams include your primary Cardiologist (physician) and Advanced Practice Providers (APPs -  Physician Assistants and Nurse Practitioners) who all work together to provide you with the care you need, when  you need it. ? ? ?Your next appointment:   ?3 month(s) ? ?The format for your next appointment:   ?In Person ? ?Provider:   ?Werner Lean, MD  or Melina Copa, PA-C, Ermalinda Barrios, PA-C, or Christen Bame, NP     ? ?  ? ?Other Instructions ? ? ?Your cardiac CT will be scheduled at the below location:  ? ?[facility] ?[address] ?[place], [place] [address] ?[phone] ?At [facility], please arrive at the [facility] and Children's Entrance (Entrance C2) of [facility] 30 minutes prior to test start time. ?You can use the FREE valet parking offered at entrance C (encouraged to control the heart rate for the test)  ?Proceed to the [facility] Radiology Department (first floor) to check-in and test prep. ? ?All radiology patients and guests should use entrance C2 at [facility], accessed from [facility], even though the hospital's physical address listed is [address]. ? ? ? ? ? ?Please follow these instructions carefully (unless otherwise directed): ? ? ?On the Night Before the Test: ?Be sure to Drink plenty of water. ?Do not consume any caffeinated/decaffeinated beverages or chocolate 12 hours prior to your test. ?Do not take any antihistamines 12 hours prior to your test. ? ?On the Day of the Test: ?Drink plenty of water until 1 hour prior to the test. ?Do not eat any food 4 hours prior to the test. ?You may take your regular medications prior to the test.  ?Take metoprolol (  Lopressor) 100 mg by mouth two hours prior to test. ?HOLD Furosemide/Hydrochlorothiazide morning of the test. ?FEMALES- please wear underwire-free bra if available, avoid dresses & tight clothing ? ?     ?After the Test: ?Drink plenty of water. ?After receiving IV contrast, you may experience a mild flushed feeling. This is normal. ?On occasion, you may experience a mild rash up to 24 hours after the test. This is not dangerous. If this occurs, you can take Benadryl 25 mg  and increase your fluid intake. ?If you experience trouble breathing, this can be serious. If it is severe call 911 IMMEDIATELY. If it is mild, please call our office. ?If you take any of these medications: Glipizide/Metformin, Avandament, Glucavance, please do not take 48 hours after completing test unless otherwise instructed. ? ?We will call to schedule your test 2-4 weeks out understanding that some insurance companies will need an authorization prior to the service being performed.  ? ?For non-scheduling related questions, please contact the cardiac imaging nurse navigator should you have any questions/concerns: ?Marchia Bond, Cardiac Imaging Nurse Navigator ?Gordy Clement, Cardiac Imaging Nurse Navigator ?[facility] ?Direct Office Dial: [phone]  ? ?For scheduling needs, including cancellations and rescheduling, please call [place], [phone].  ? ?Important Information About Sugar ? ? ? ? ?  ?

## 2021-09-07 ENCOUNTER — Telehealth: Payer: Self-pay | Admitting: Internal Medicine

## 2021-09-07 ENCOUNTER — Telehealth: Payer: Self-pay

## 2021-09-07 DIAGNOSIS — E119 Type 2 diabetes mellitus without complications: Secondary | ICD-10-CM

## 2021-09-07 LAB — PRO B NATRIURETIC PEPTIDE: NT-Pro BNP: 447 pg/mL — ABNORMAL HIGH (ref 0–301)

## 2021-09-07 LAB — BASIC METABOLIC PANEL
BUN/Creatinine Ratio: 17 (ref 12–28)
BUN: 18 mg/dL (ref 8–27)
CO2: 23 mmol/L (ref 20–29)
Calcium: 9.5 mg/dL (ref 8.7–10.3)
Chloride: 104 mmol/L (ref 96–106)
Creatinine, Ser: 1.08 mg/dL — ABNORMAL HIGH (ref 0.57–1.00)
Glucose: 163 mg/dL — ABNORMAL HIGH (ref 70–99)
Potassium: 5.3 mmol/L — ABNORMAL HIGH (ref 3.5–5.2)
Sodium: 144 mmol/L (ref 134–144)
eGFR: 54 mL/min/{1.73_m2} — ABNORMAL LOW (ref >59)

## 2021-09-07 MED ORDER — FUROSEMIDE 40 MG PO TABS: 40.0000 mg | ORAL_TABLET | Freq: Every day | ORAL | 3 refills | Status: DC

## 2021-09-07 NOTE — Telephone Encounter (Signed)
The patient has been notified of the result and verbalized understanding.  All questions (if any) were answered. ?Precious Gilding, RN 09/07/2021 1:46 PM   ?F/U labs scheduled for 09/16/21.  ?

## 2021-09-07 NOTE — Telephone Encounter (Signed)
?  Transferred call to RN ?

## 2021-09-07 NOTE — Telephone Encounter (Signed)
-----   Message from Werner Lean, MD sent at 09/07/2021  8:54 AM EDT ----- ?Results: ?BNP elevated ?K is mildly elevated ?Plan: ?Restart lasix 40 mg PO daily ?Do not restart K 10 ?BMP in one to two weeks ? ?Werner Lean, MD ? ?

## 2021-09-07 NOTE — Telephone Encounter (Signed)
?  Pt is returning call to get result ?

## 2021-09-08 ENCOUNTER — Other Ambulatory Visit: Payer: Self-pay

## 2021-09-08 ENCOUNTER — Ambulatory Visit: Payer: Medicare Other | Admitting: Internal Medicine

## 2021-09-08 MED ORDER — FUROSEMIDE 40 MG PO TABS: 40.0000 mg | ORAL_TABLET | Freq: Every day | ORAL | 3 refills | Status: DC

## 2021-09-08 NOTE — Telephone Encounter (Signed)
Pt's medication was sent to pt's pharmacy as requested. Confirmation received.  °

## 2021-09-14 DIAGNOSIS — L570 Actinic keratosis: Secondary | ICD-10-CM | POA: Diagnosis not present

## 2021-09-14 DIAGNOSIS — D485 Neoplasm of uncertain behavior of skin: Secondary | ICD-10-CM | POA: Diagnosis not present

## 2021-09-16 ENCOUNTER — Other Ambulatory Visit: Payer: Medicare Other | Admitting: *Deleted

## 2021-09-16 DIAGNOSIS — E119 Type 2 diabetes mellitus without complications: Secondary | ICD-10-CM | POA: Diagnosis not present

## 2021-09-16 DIAGNOSIS — I10 Essential (primary) hypertension: Secondary | ICD-10-CM | POA: Diagnosis not present

## 2021-09-16 LAB — BASIC METABOLIC PANEL
BUN/Creatinine Ratio: 25 (ref 12–28)
BUN: 27 mg/dL (ref 8–27)
CO2: 25 mmol/L (ref 20–29)
Calcium: 9 mg/dL (ref 8.7–10.3)
Chloride: 103 mmol/L (ref 96–106)
Creatinine, Ser: 1.06 mg/dL — ABNORMAL HIGH (ref 0.57–1.00)
Glucose: 276 mg/dL — ABNORMAL HIGH (ref 70–99)
Potassium: 4.2 mmol/L (ref 3.5–5.2)
Sodium: 144 mmol/L (ref 134–144)
eGFR: 55 mL/min/{1.73_m2} — ABNORMAL LOW (ref >59)

## 2021-09-26 ENCOUNTER — Ambulatory Visit (HOSPITAL_COMMUNITY): Payer: Medicare Other | Attending: Cardiovascular Disease

## 2021-09-26 DIAGNOSIS — I350 Nonrheumatic aortic (valve) stenosis: Secondary | ICD-10-CM | POA: Diagnosis not present

## 2021-09-26 LAB — ECHOCARDIOGRAM COMPLETE
AR max vel: 1.18 cm2
AV Area VTI: 1.25 cm2
AV Area mean vel: 1.14 cm2
AV Mean grad: 21 mmHg
AV Peak grad: 40.2 mmHg
Ao pk vel: 3.17 m/s
Area-P 1/2: 2.7 cm2
S' Lateral: 3.2 cm

## 2021-09-28 ENCOUNTER — Telehealth (HOSPITAL_COMMUNITY): Payer: Self-pay | Admitting: *Deleted

## 2021-09-28 NOTE — Telephone Encounter (Signed)
Attempted to call patient regarding upcoming cardiac CT appointment. °Left message on voicemail with name and callback number ° °Shontell Prosser RN Navigator Cardiac Imaging °[facility] °[phone] Office °[phone] Cell ° °

## 2021-09-29 ENCOUNTER — Encounter (HOSPITAL_COMMUNITY): Payer: Self-pay

## 2021-09-29 ENCOUNTER — Ambulatory Visit (HOSPITAL_COMMUNITY): Admission: RE | Admit: 2021-09-29 | Payer: Medicare Other | Source: Ambulatory Visit

## 2021-10-06 DIAGNOSIS — M792 Neuralgia and neuritis, unspecified: Secondary | ICD-10-CM | POA: Diagnosis not present

## 2021-10-06 DIAGNOSIS — G893 Neoplasm related pain (acute) (chronic): Secondary | ICD-10-CM | POA: Diagnosis not present

## 2021-10-06 DIAGNOSIS — M79605 Pain in left leg: Secondary | ICD-10-CM | POA: Diagnosis not present

## 2021-10-06 DIAGNOSIS — J3489 Other specified disorders of nose and nasal sinuses: Secondary | ICD-10-CM | POA: Diagnosis not present

## 2021-10-06 DIAGNOSIS — C3490 Malignant neoplasm of unspecified part of unspecified bronchus or lung: Secondary | ICD-10-CM | POA: Diagnosis not present

## 2021-10-06 DIAGNOSIS — K5903 Drug induced constipation: Secondary | ICD-10-CM | POA: Diagnosis not present

## 2021-10-06 DIAGNOSIS — M549 Dorsalgia, unspecified: Secondary | ICD-10-CM | POA: Diagnosis not present

## 2021-10-06 DIAGNOSIS — Z515 Encounter for palliative care: Secondary | ICD-10-CM | POA: Diagnosis not present

## 2021-10-06 DIAGNOSIS — R251 Tremor, unspecified: Secondary | ICD-10-CM | POA: Diagnosis not present

## 2021-10-06 DIAGNOSIS — M2550 Pain in unspecified joint: Secondary | ICD-10-CM | POA: Diagnosis not present

## 2021-10-06 DIAGNOSIS — T402X5A Adverse effect of other opioids, initial encounter: Secondary | ICD-10-CM | POA: Diagnosis not present

## 2021-10-06 DIAGNOSIS — G8929 Other chronic pain: Secondary | ICD-10-CM | POA: Diagnosis not present

## 2021-10-11 ENCOUNTER — Other Ambulatory Visit: Payer: Self-pay | Admitting: Internal Medicine

## 2021-10-12 DIAGNOSIS — T148XXA Other injury of unspecified body region, initial encounter: Secondary | ICD-10-CM | POA: Diagnosis not present

## 2021-10-19 ENCOUNTER — Telehealth (HOSPITAL_COMMUNITY): Payer: Self-pay | Admitting: *Deleted

## 2021-10-19 NOTE — Telephone Encounter (Signed)
Reaching out to patient to offer assistance regarding upcoming cardiac imaging study; pt verbalizes understanding of appt date/time, parking situation and where to check in, pre-test NPO status and medications ordered, and verified current allergies; name and call back number provided for further questions should they arise  Gordy Clement RN Navigator Cardiac Imaging [facility] [phone] office [phone] cell  Patient to take 100mg  metoprolol tartrate two hours prior to her cardiac CT scan.  She is aware to arrive at 2pm.

## 2021-10-20 ENCOUNTER — Ambulatory Visit (HOSPITAL_COMMUNITY)
Admission: RE | Admit: 2021-10-20 | Discharge: 2021-10-20 | Disposition: A | Discharge status: Home / Self Care | Payer: Medicare Other | Source: Ambulatory Visit | Attending: Internal Medicine | Admitting: Internal Medicine

## 2021-10-20 DIAGNOSIS — R072 Precordial pain: Secondary | ICD-10-CM | POA: Diagnosis not present

## 2021-10-20 MED ORDER — NITROGLYCERIN 0.4 MG SL SUBL
SUBLINGUAL_TABLET | SUBLINGUAL | Status: AC
  Filled 2021-10-20: qty 2

## 2021-10-20 MED ORDER — NITROGLYCERIN 0.4 MG SL SUBL
0.8000 mg | SUBLINGUAL_TABLET | Freq: Once | SUBLINGUAL | Status: AC
  Administered 2021-10-20: 0.8 mg via SUBLINGUAL

## 2021-10-20 MED ORDER — IOHEXOL 350 MG/ML SOLN
100.0000 mL | Freq: Once | INTRAVENOUS | Status: AC | PRN
  Administered 2021-10-20: 100 mL via INTRAVENOUS

## 2021-10-21 DIAGNOSIS — N1831 Chronic kidney disease, stage 3a: Secondary | ICD-10-CM | POA: Diagnosis not present

## 2021-10-21 DIAGNOSIS — E1165 Type 2 diabetes mellitus with hyperglycemia: Secondary | ICD-10-CM | POA: Diagnosis not present

## 2021-10-21 DIAGNOSIS — C3490 Malignant neoplasm of unspecified part of unspecified bronchus or lung: Secondary | ICD-10-CM | POA: Diagnosis not present

## 2021-10-21 DIAGNOSIS — E559 Vitamin D deficiency, unspecified: Secondary | ICD-10-CM | POA: Diagnosis not present

## 2021-10-21 DIAGNOSIS — N390 Urinary tract infection, site not specified: Secondary | ICD-10-CM | POA: Diagnosis not present

## 2021-10-21 DIAGNOSIS — D649 Anemia, unspecified: Secondary | ICD-10-CM | POA: Diagnosis not present

## 2021-10-21 DIAGNOSIS — E538 Deficiency of other specified B group vitamins: Secondary | ICD-10-CM | POA: Diagnosis not present

## 2021-10-21 DIAGNOSIS — R251 Tremor, unspecified: Secondary | ICD-10-CM | POA: Diagnosis not present

## 2021-10-21 DIAGNOSIS — R5383 Other fatigue: Secondary | ICD-10-CM | POA: Diagnosis not present

## 2021-10-21 DIAGNOSIS — E785 Hyperlipidemia, unspecified: Secondary | ICD-10-CM | POA: Diagnosis not present

## 2021-10-21 DIAGNOSIS — Z0000 Encounter for general adult medical examination without abnormal findings: Secondary | ICD-10-CM | POA: Diagnosis not present

## 2021-10-21 DIAGNOSIS — J449 Chronic obstructive pulmonary disease, unspecified: Secondary | ICD-10-CM | POA: Diagnosis not present

## 2021-10-31 ENCOUNTER — Other Ambulatory Visit: Payer: Self-pay | Admitting: Internal Medicine

## 2021-11-02 DIAGNOSIS — J984 Other disorders of lung: Secondary | ICD-10-CM | POA: Diagnosis not present

## 2021-11-02 DIAGNOSIS — G4733 Obstructive sleep apnea (adult) (pediatric): Secondary | ICD-10-CM | POA: Diagnosis not present

## 2021-11-02 DIAGNOSIS — J45909 Unspecified asthma, uncomplicated: Secondary | ICD-10-CM | POA: Diagnosis not present

## 2021-11-16 DIAGNOSIS — Z961 Presence of intraocular lens: Secondary | ICD-10-CM | POA: Diagnosis not present

## 2021-11-16 DIAGNOSIS — H40013 Open angle with borderline findings, low risk, bilateral: Secondary | ICD-10-CM | POA: Diagnosis not present

## 2021-11-21 ENCOUNTER — Other Ambulatory Visit: Payer: Self-pay | Admitting: Internal Medicine

## 2021-11-21 DIAGNOSIS — D509 Iron deficiency anemia, unspecified: Secondary | ICD-10-CM

## 2021-11-21 DIAGNOSIS — C3490 Malignant neoplasm of unspecified part of unspecified bronchus or lung: Secondary | ICD-10-CM

## 2021-11-21 DIAGNOSIS — I3139 Other pericardial effusion (noninflammatory): Secondary | ICD-10-CM

## 2021-11-21 DIAGNOSIS — E119 Type 2 diabetes mellitus without complications: Secondary | ICD-10-CM

## 2021-11-21 DIAGNOSIS — I480 Paroxysmal atrial fibrillation: Secondary | ICD-10-CM

## 2021-11-21 DIAGNOSIS — G4733 Obstructive sleep apnea (adult) (pediatric): Secondary | ICD-10-CM

## 2021-11-29 DIAGNOSIS — C44319 Basal cell carcinoma of skin of other parts of face: Secondary | ICD-10-CM | POA: Diagnosis not present

## 2021-11-29 DIAGNOSIS — H61033 Chondritis of external ear, bilateral: Secondary | ICD-10-CM | POA: Diagnosis not present

## 2021-11-29 DIAGNOSIS — R208 Other disturbances of skin sensation: Secondary | ICD-10-CM | POA: Diagnosis not present

## 2021-11-29 DIAGNOSIS — D485 Neoplasm of uncertain behavior of skin: Secondary | ICD-10-CM | POA: Diagnosis not present

## 2021-11-29 DIAGNOSIS — R234 Changes in skin texture: Secondary | ICD-10-CM | POA: Diagnosis not present

## 2021-11-29 DIAGNOSIS — Z789 Other specified health status: Secondary | ICD-10-CM | POA: Diagnosis not present

## 2021-12-08 DIAGNOSIS — Z79891 Long term (current) use of opiate analgesic: Secondary | ICD-10-CM | POA: Diagnosis not present

## 2021-12-08 DIAGNOSIS — Z7951 Long term (current) use of inhaled steroids: Secondary | ICD-10-CM | POA: Diagnosis not present

## 2021-12-08 DIAGNOSIS — M5442 Lumbago with sciatica, left side: Secondary | ICD-10-CM | POA: Diagnosis not present

## 2021-12-08 DIAGNOSIS — C7951 Secondary malignant neoplasm of bone: Secondary | ICD-10-CM | POA: Diagnosis not present

## 2021-12-08 DIAGNOSIS — R251 Tremor, unspecified: Secondary | ICD-10-CM | POA: Diagnosis not present

## 2021-12-08 DIAGNOSIS — M2550 Pain in unspecified joint: Secondary | ICD-10-CM | POA: Diagnosis not present

## 2021-12-08 DIAGNOSIS — R918 Other nonspecific abnormal finding of lung field: Secondary | ICD-10-CM | POA: Diagnosis not present

## 2021-12-08 DIAGNOSIS — K5903 Drug induced constipation: Secondary | ICD-10-CM | POA: Diagnosis not present

## 2021-12-08 DIAGNOSIS — Z79899 Other long term (current) drug therapy: Secondary | ICD-10-CM | POA: Diagnosis not present

## 2021-12-08 DIAGNOSIS — Z515 Encounter for palliative care: Secondary | ICD-10-CM | POA: Diagnosis not present

## 2021-12-08 DIAGNOSIS — M4727 Other spondylosis with radiculopathy, lumbosacral region: Secondary | ICD-10-CM | POA: Diagnosis not present

## 2021-12-08 DIAGNOSIS — R5383 Other fatigue: Secondary | ICD-10-CM | POA: Diagnosis not present

## 2021-12-08 DIAGNOSIS — G8929 Other chronic pain: Secondary | ICD-10-CM | POA: Diagnosis not present

## 2021-12-08 DIAGNOSIS — Z794 Long term (current) use of insulin: Secondary | ICD-10-CM | POA: Diagnosis not present

## 2021-12-08 DIAGNOSIS — C3411 Malignant neoplasm of upper lobe, right bronchus or lung: Secondary | ICD-10-CM | POA: Diagnosis not present

## 2021-12-08 DIAGNOSIS — Z7984 Long term (current) use of oral hypoglycemic drugs: Secondary | ICD-10-CM | POA: Diagnosis not present

## 2021-12-08 DIAGNOSIS — Z7952 Long term (current) use of systemic steroids: Secondary | ICD-10-CM | POA: Diagnosis not present

## 2021-12-08 DIAGNOSIS — Z87891 Personal history of nicotine dependence: Secondary | ICD-10-CM | POA: Diagnosis not present

## 2021-12-08 DIAGNOSIS — Z9049 Acquired absence of other specified parts of digestive tract: Secondary | ICD-10-CM | POA: Diagnosis not present

## 2021-12-08 DIAGNOSIS — M792 Neuralgia and neuritis, unspecified: Secondary | ICD-10-CM | POA: Diagnosis not present

## 2021-12-08 DIAGNOSIS — T402X5A Adverse effect of other opioids, initial encounter: Secondary | ICD-10-CM | POA: Diagnosis not present

## 2021-12-08 DIAGNOSIS — R52 Pain, unspecified: Secondary | ICD-10-CM | POA: Diagnosis not present

## 2021-12-21 ENCOUNTER — Ambulatory Visit: Payer: Medicare Other | Admitting: Internal Medicine

## 2021-12-21 NOTE — Progress Notes (Deleted)
Cardiology Office Note:    Date:  12/21/2021   ID:  [name], DOB [birth year], MRN [id number]  PCP:  [name], MD   [facility] Providers Cardiologist:  Werner Lean, MD     Referring MD: [name], MD   DOD Chest pain  History of Present Illness:    [name] is a 76 y.o. female with a hx of HTN with DM, HLD with DM, CAC and Aortic atherosclerosis, IDA, Morbid Obesity, OSA on COPD previously need home O2, NSCLS s/p Palliative radiation in 2018, prior Ipilumumab & Nivolumab Last in 2019 at [facility], prior malignant tamponade 2018, who established in 2021 for PAF.  Started on [facility] and was lost to follow up. 2023: started back on lasix.  Echo showed mild to moderate AS with normal LV function.  Calcium score 496.  Patient notes that she is doing ***.   Since last visit notes *** . There are no*** interval hospital/ED visit.    No chest pain or pressure ***.  No SOB/DOE*** and no PND/Orthopnea***.  No weight gain or leg swelling***.  No palpitations or syncope ***.  Ambulatory blood pressure ***.    Past Medical History:  Diagnosis Date   Anxiety    Arthritis    generalized   Asthma    Bleeding nose    Cancer ([place]) 2017   Lung    COPD (chronic obstructive pulmonary disease) (HCC)    Diabetes mellitus    Diverticulitis    Dyspnea    Dysrhythmia    paraxysmol atrial fibrillation    H. pylori infection    Heart murmur    mild AS (03/16/20 echo)   Hypertension    Liver disease    Lung nodule 2017   Both lungs-pt at [facility].    Neuromuscular disorder ([place])    bilateral feet   Parkinson disease ([place]) 2010   Sleep apnea    Tubular adenoma of colon 05/2007    Past Surgical History:  Procedure Laterality Date   APPENDECTOMY     BREAST SURGERY     implants at age [age] (removed 15 years later)   CHOLECYSTECTOMY     COLON SURGERY     colectomy and reversal after diverticular ds   PARTIAL COLECTOMY     PERICARDIAL WINDOW N/A 02/28/2017    Procedure: PERICARDIAL WINDOW;  Surgeon: [name], MD;  Location: [facility] OR;  Service: Thoracic;  Laterality: N/A;   TONSILLECTOMY     TOTAL KNEE ARTHROPLASTY     right   VAGINAL HYSTERECTOMY      Current Medications: No outpatient medications have been marked as taking for the 12/21/21 encounter (Appointment) with Werner Lean, MD.     Allergies:   Morphine and related and Tape   Social History   Socioeconomic History   Marital status: Single    Spouse name: Not on file   Number of children: 5   Years of education: Not on file   Highest education level: Not on file  Occupational History   Occupation: retired    Comment: Charity fundraiser; [employer]  Tobacco Use   Smoking status: Former    Packs/day: 1.00    Years: 28.00    Total pack years: 28.00    Types: Cigarettes    Quit date: 03/20/1987    Years since quitting: 34.7   Smokeless tobacco: Never  Vaping Use   Vaping Use: Never used  Substance and Sexual Activity   Alcohol  use: Yes    Comment: 2 margaritas a year   Drug use: No   Sexual activity: Never  Other Topics Concern   Not on file  Social History Narrative   Not on file   Social Determinants of Health   Financial Resource Strain: Not on file  Food Insecurity: Not on file  Transportation Needs: Not on file  Physical Activity: Not on file  Stress: Not on file  Social Connections: Not on file     Family History: The patient's family history includes Diabetes Mellitus II in her sister and son. There is no history of Colon cancer.  ROS:   Please see the history of present illness.     All other systems reviewed and are negative.  EKGs/Labs/Other Studies Reviewed:    The following studies were reviewed today:   EKG:  EKG is  ordered today.  The ekg ordered today demonstrates  09/05/21: SR RBBB Rate 85  Cardiac Event Monitoring: Date: 03/12/20 Results: Patient had a min HR of 53 bpm, max HR of 146 bpm, and avg HR of 79  bpm. Predominant underlying rhythm was sinus rhythm. One run of tachycardia occurred lasting 5 beats with a max rate of 102 bpm. Isolated PACs were rare (<1.0%), with rare couplets present. Isolated PVCs were rare (<1.0%), with rare couplets and bigeminy present. No evidence of complete heart block. Triggered and diary events associated with sinus rhythm.   No malignant arrhythmias.  Transthoracic Echocardiogram: Date: 03/16/20 Results: 1. Left ventricular ejection fraction, by estimation, is 55 to 60%. The  left ventricle has normal function. The left ventricle has no regional  wall motion abnormalities. Left ventricular diastolic parameters are  consistent with Grade II diastolic  dysfunction (pseudonormalization).   2. Right ventricular systolic function is mildly reduced. The right  ventricular size is mildly enlarged. There is mildly elevated pulmonary  artery systolic pressure. The estimated right ventricular systolic  pressure is 62.7 mmHg.   3. Left atrial size was moderately dilated.   4. Right atrial size was moderately dilated.   5. The mitral valve is grossly normal. No evidence of mitral valve  regurgitation. The mean mitral valve gradient is 3.0 mmHg with average  heart rate of 78 bpm. Mild MAC.   6. The aortic valve is abnormal. There is moderate calcification of the  aortic valve. Aortic valve regurgitation is not visualized. Mild aortic  valve stenosis. Aortic valve mean gradient measures 16.0 mmHg.   7. The inferior vena cava is normal in size with greater than 50%  respiratory variability, suggesting right atrial pressure of 3 mmHg.    CT CORONARY MORPH W/CTA COR W/SCORE W/CA W/CM &/OR WO/CM 10/20/2021  Addendum 10/21/2021  2:53 PM ADDENDUM REPORT: 10/21/2021 14:51  EXAM: OVER-READ INTERPRETATION  CT CHEST  The following report is an over-read performed by radiologist Dr. Barnetta Hammersmith [facility] on 10/21/2021. This over-read does not  include interpretation of cardiac or coronary anatomy or pathology. The coronary CTA interpretation by the cardiologist is attached.  COMPARISON:  Prior CTA of the chest on 06/19/2017  FINDINGS: No significant noncardiac vascular findings. Visualized mediastinum and hilar regions demonstrate no lymphadenopathy or masses. Visualized lungs show no evidence of pulmonary edema, consolidation, pneumothorax, nodule or pleural fluid. Visualized upper abdomen and bony structures are unremarkable.  IMPRESSION: No significant incidental findings.   Electronically Signed By: Aletta Edouard M.D. On: 10/21/2021 14:51  Narrative CLINICAL DATA:  76 yo female with chest pain  EXAM: Cardiac/Coronary  CTA  TECHNIQUE: A non-contrast, gated CT scan was obtained with axial slices of 3 mm through the heart for calcium scoring. Calcium scoring was performed using the Agatston method. A 120 kV prospective, gated, contrast cardiac scan was obtained. Gantry rotation speed was 250 msecs and collimation was 0.6 mm. Two sublingual nitroglycerin tablets (0.8 mg) were given. The 3D data set was reconstructed in 5% intervals of the 35-75% of the R-R cycle. Diastolic phases were analyzed on a dedicated workstation using MPR, MIP, and VRT modes. The patient received 95 cc of contrast.  FINDINGS: Image quality: Average.  Noise artifact is: Moderate.  (Mis-registration).  Coronary Arteries:  Normal coronary origin.  Right dominance.  Left main: The left main is a large caliber vessel with a normal take off from the left coronary cusp that trifurcates into a LAD, LCX, and ramus intermedius. There is minimal (0-24) calcified plaque in the ostium.  Left anterior descending artery: The LAD is has minimal (0-24) soft plaque in the proximal to mid vessel. The LAD gives off 2 patent diagonal branches.  Ramus intermedius: Very small.  Left circumflex artery: The LCX is non-dominant and has mild  (25-49) soft plaque in the mid vessel. The LCX gives off 2 patent obtuse marginal branches.  Right coronary artery: The RCA is dominant with normal take off from the right coronary cusp. There is mild (25-49) mixed plaque stenosis in the proximal vessel. The RCA terminates as a PDA and right posterolateral branch without evidence of plaque or stenosis.  Right Atrium: Mildly dilated.  Right Ventricle: The right ventricular cavity is within normal limits.  Left Atrium: Left atrial size is mildly dilated with no left atrial appendage filling defect.  Left Ventricle: The ventricular cavity size is within normal limits. There are no stigmata of prior infarction. There is no abnormal filling defect.  Pulmonary arteries: Normal in size without proximal filling defect.  Pulmonary veins: Normal pulmonary venous drainage.  Pericardium: Normal thickness with no significant effusion or calcium present.  Cardiac valves: The aortic valve is trileaflet with calcification. The mitral valve is normal structure without significant calcification.  Aorta: Normal caliber with aortic atherosclerosis noted.  Extra-cardiac findings: See attached radiology report for non-cardiac structures.  IMPRESSION: 1. Coronary calcium score of 63.1. This was 56 percentile for age-, sex, and race-matched controls.  2. Normal coronary origin with right dominance.  3. Mild (25-49) stenoses in the Lcx and RCA.  4. Aortic atherosclerosis.  5. Calcified aortic valve Calcium score 496.  6. Mild biatrial enlargement.  RECOMMENDATIONS: CAD-RADS 2: Mild non-obstructive CAD (25-49%). Consider non-atherosclerotic causes of chest pain. Consider preventive therapy and risk factor modification.  Kirk Ruths, MD  Electronically Signed: By: Kirk Ruths M.D. On: 10/20/2021 17:11    09/04/2021: Hemoglobin 12.6; Platelets 232 09/06/2021: NT-Pro BNP 447 09/16/2021: BUN 27; Creatinine, Ser 1.06; Potassium  4.2; Sodium 144  Recent Lipid Panel    Component Value Date/Time   CHOL  10/01/2009 0201    153        ATP III CLASSIFICATION:  <200     mg/dL   Desirable  200-239  mg/dL   Borderline High  >=240    mg/dL   High          TRIG 272 (H) 10/01/2009 0201   HDL 37 (L) 10/01/2009 0201   CHOLHDL 4.1 10/01/2009 0201   VLDL 54 (H) 10/01/2009 0201   LDLCALC  10/01/2009 0201    62  Total Cholesterol/HDL:CHD Risk Coronary Heart Disease Risk Table                     Men   Women  1/2 Average Risk   3.4   3.3  Average Risk       5.0   4.4  2 X Average Risk   9.6   7.1  3 X Average Risk  23.4   11.0        Use the calculated Patient Ratio above and the CHD Risk Table to determine the patient's CHD Risk.        ATP III CLASSIFICATION (LDL):  <100     mg/dL   Optimal  100-129  mg/dL   Near or Above                    Optimal  130-159  mg/dL   Borderline  160-189  mg/dL   High  >190     mg/dL   Very High     Risk Assessment/Calculations:    CHA2DS2-VASc Score = 4   This indicates a 4.8% annual risk of stroke. The patient's score is based upon: CHF History: 0 HTN History: 1 Diabetes History: 1 Stroke History: 0 Vascular Disease History: 0 Age Score: 1 Gender Score: 1   {This patient has a significant risk of stroke if diagnosed with atrial fibrillation.  Please consider VKA or DOAC agent for anticoagulation if the bleeding risk is acceptable.   You can also use the SmartPhrase .[place] for documentation.   :[id number]}       Physical Exam:    VS:  There were no vitals taken for this visit.    Wt Readings from Last 3 Encounters:  09/06/21 116.6 kg  09/04/21 113.4 kg  08/27/20 122.5 kg     Gen: no distress, morbidly obese   Neck: Thick neck, no carotid bruit Cardiac: No Rubs or Gallops, systolic murmur, RRR +2 radial pulses Respiratory: Bilateral expiratory wheeze, normal effort, normal  respiratory rate GI: Soft, nontender, non-distended  MS: non pitting  edema;  moves all extremities Integument: Skin feels warm Neuro:  At time of evaluation, alert and oriented to person/place/time/situation  Psych: Normal affect, patient feels poorly   ASSESSMENT:    No diagnosis found.  PLAN:    Mild non obstructive CAD Aortic atherosclerosis HLD with DM Mild to moderate Aortic Stenosis - LDL goal < 55 give this and prior radiation - Reviewed prior CT with patient - will start PRN nitro - will continue diltiazem 120 mg PO XL - I worry this is a Cohort C patient and we may have limited interventions to ovver - echo in one year  Morbid Obesity HTN with DM COPD not on home meds *** OSA on CPAP NSCLC on prior radiation and chemotherapy, prior malignant tamponade - Lasix  PAF CHADSVASC 4 Recommend patient to continue Xarleto  Six months me or APP    Medication Adjustments/Labs and Tests Ordered: Current medicines are reviewed at length with the patient today.  Concerns regarding medicines are outlined above.  No orders of the defined types were placed in this encounter.  No orders of the defined types were placed in this encounter.   There are no Patient Instructions on file for this visit.   Signed, Werner Lean, MD  12/21/2021 9:59 AM    [facility]

## 2022-01-19 ENCOUNTER — Telehealth: Payer: Self-pay

## 2022-01-19 NOTE — Patient Outreach (Signed)
  Care Coordination   Initial Visit Note   01/19/2022 Name: [name] MRN: [id number] DOB: 04/06/1946  [name] is a 76 y.o. year old female who sees [name], MD for primary care. I spoke with  [name] by phone today.  What matters to the patients health and wellness today?  No Concerns Expressed/Requested call back.    Goals Addressed   None     SDOH assessments and interventions completed:  No     Care Coordination Interventions Activated:  No  Care Coordination Interventions:  No, not indicated   Follow up plan:  A member of the care management team will follow up next week.    Encounter Outcome:  Pt. Request to Call Back   [place] [facility] [phone]

## 2022-02-02 DIAGNOSIS — J45909 Unspecified asthma, uncomplicated: Secondary | ICD-10-CM | POA: Diagnosis not present

## 2022-02-02 DIAGNOSIS — G4733 Obstructive sleep apnea (adult) (pediatric): Secondary | ICD-10-CM | POA: Diagnosis not present

## 2022-02-02 DIAGNOSIS — J984 Other disorders of lung: Secondary | ICD-10-CM | POA: Diagnosis not present

## 2022-03-14 DIAGNOSIS — H6591 Unspecified nonsuppurative otitis media, right ear: Secondary | ICD-10-CM | POA: Diagnosis not present

## 2022-03-14 DIAGNOSIS — H9311 Tinnitus, right ear: Secondary | ICD-10-CM | POA: Diagnosis not present

## 2022-03-14 DIAGNOSIS — H9011 Conductive hearing loss, unilateral, right ear, with unrestricted hearing on the contralateral side: Secondary | ICD-10-CM | POA: Diagnosis not present

## 2022-03-14 DIAGNOSIS — H938X1 Other specified disorders of right ear: Secondary | ICD-10-CM | POA: Diagnosis not present

## 2022-03-29 DIAGNOSIS — J449 Chronic obstructive pulmonary disease, unspecified: Secondary | ICD-10-CM | POA: Diagnosis not present

## 2022-03-29 DIAGNOSIS — B3731 Acute candidiasis of vulva and vagina: Secondary | ICD-10-CM | POA: Diagnosis not present

## 2022-03-29 DIAGNOSIS — R059 Cough, unspecified: Secondary | ICD-10-CM | POA: Diagnosis not present

## 2022-03-29 DIAGNOSIS — N1831 Chronic kidney disease, stage 3a: Secondary | ICD-10-CM | POA: Diagnosis not present

## 2022-04-13 DIAGNOSIS — J449 Chronic obstructive pulmonary disease, unspecified: Secondary | ICD-10-CM | POA: Diagnosis not present

## 2022-04-13 DIAGNOSIS — N1831 Chronic kidney disease, stage 3a: Secondary | ICD-10-CM | POA: Diagnosis not present

## 2022-04-13 DIAGNOSIS — E1165 Type 2 diabetes mellitus with hyperglycemia: Secondary | ICD-10-CM | POA: Diagnosis not present

## 2022-04-13 DIAGNOSIS — R059 Cough, unspecified: Secondary | ICD-10-CM | POA: Diagnosis not present

## 2022-04-14 DIAGNOSIS — H90A31 Mixed conductive and sensorineural hearing loss, unilateral, right ear with restricted hearing on the contralateral side: Secondary | ICD-10-CM | POA: Diagnosis not present

## 2022-04-14 DIAGNOSIS — H6592 Unspecified nonsuppurative otitis media, left ear: Secondary | ICD-10-CM | POA: Diagnosis not present

## 2022-04-14 DIAGNOSIS — H90A22 Sensorineural hearing loss, unilateral, left ear, with restricted hearing on the contralateral side: Secondary | ICD-10-CM | POA: Diagnosis not present

## 2022-05-01 ENCOUNTER — Ambulatory Visit: Payer: Medicare Other | Admitting: Nurse Practitioner

## 2022-05-01 ENCOUNTER — Encounter: Payer: Self-pay | Admitting: Nurse Practitioner

## 2022-05-01 VITALS — BP 118/70 | HR 84 | Ht 63.0 in | Wt 260.4 lb

## 2022-05-01 DIAGNOSIS — C3490 Malignant neoplasm of unspecified part of unspecified bronchus or lung: Secondary | ICD-10-CM

## 2022-05-01 DIAGNOSIS — J449 Chronic obstructive pulmonary disease, unspecified: Secondary | ICD-10-CM

## 2022-05-01 DIAGNOSIS — G4733 Obstructive sleep apnea (adult) (pediatric): Secondary | ICD-10-CM

## 2022-05-01 NOTE — Patient Instructions (Addendum)
Continue Albuterol inhaler 2 puffs or 3 mL neb every 6 hours as needed for shortness of breath or wheezing. Notify if symptoms persist despite rescue inhaler/neb use.  Continue Trelegy 1 puff daily. Brush tongue and rinse mouth afterwards Continue supplemental oxygen 2 lpm as needed for goal >88-90%  Continue to use CPAP every night, minimum of 4-6 hours a night.  Change equipment every 30 days or as directed by DME. Wash your tubing with warm soap and water daily, hang to dry. Wash humidifier portion weekly.  Be aware of reduced alertness and do not drive or operate heavy machinery if experiencing this or drowsiness.  Exercise encouraged, as tolerated. Notify if persistent daytime sleepiness occurs even with consistent use of CPAP.  Orders sent for new machine today  Guaifenesin [phone] mg Twice daily as needed for chest congestion  Follow up in 3 months with Dr. Shearon Stalls. If symptoms worsen, please contact office for sooner follow up or seek emergency care.

## 2022-05-01 NOTE — Progress Notes (Unsigned)
@Patient  ID: [name], female    DOB: [birth year], 77 y.o.   MRN: [id number]  Chief Complaint  Patient presents with   Follow-up    Pt f/u for COPD & OSA, she is having increased SOB. She states that at night her O2 sats drop. CPAP machine is making a horrible noise that is keeping her awake as well. [employer] states she needs another prescription.     Referring provider: [name], MD  HPI: 77 year old female, former smoker followed for COPD and OSA on CPAP.  She is a patient of Dr. Mauricio Po and last seen in office 07/12/2020 by Volanda Napoleon NP.  Past medical history significant for diabetes, PAF, Parkinson's, anxiety, HLD, obesity.  She has stage IV non-small cell lung cancer with bone mets and possible brain mets, previously treated with immunotherapy which was stopped in October 2019; scans have been stable.  She is followed by Dr. Ready with [facility].  TEST/EVENTS:  2014 PSG: AHI 16.6/h 09/26/2021 echo: EF 60-65%, moderate LVH, G2DD, nl PASP. Mild to moderate AS.   07/12/2020: Virtual visit with Volanda Napoleon NP.  Increase shortness of breath.  Concern for pleural effusion due to cancer history.  During her last visit Dr. Shearon Stalls, she was noted to have progressive shortness of breath over the last 1 to 2 months.  Maintained on Trelegy inhaler which has been beneficial.  Needs portable concentrator.  She had a CT chest with [facility] recently which showed stability of groundglass opacities and nodules as well as emphysema.  Currently off all therapy for cancer due to immune mediated adrenal insufficiency and side effects.  Over the last 2 days, noticed O2 levels have been desaturating into the mid 80s during the day.  Coughing up gray-colored mucus.  Has associated wheezing.  Currently on hydrocortisone 25 mg daily.  Compliant with Trelegy.  Has noticed some swelling in both of her legs which is new.  Treated for AECOPD with doxycycline and continue hydrocortisone.  Recommend taking an additional dose of  Lasix tomorrow due to bilateral lower extremity swelling.  05/01/2022: Today-follow-up Patient presents today for follow-up.  She has been having some trouble with her CPAP machine over the last few weeks.  Notices that she does not feel that she is getting as much air and the machine is making a very loud buzzing noise.  She wakes up in the morning and the machine shows a sad face which is not usual.  Feels like she is having trouble At night with her breathing because of it.  Otherwise, she feels like she has been relatively stable since she was here last.  She uses Trelegy daily.  No increased cough or chest congestion.  No increased oxygen requirements.  She is not using oxygen much during the day.  Occasionally will put it on as needed.  03/28/2022-04/26/2022: CPAP 13 cmH2O 30/30 days; 100% >4 hr; av use 9 hr 4 min Leaks median 5, 95th 27.3 AHI 1.8  Allergies  Allergen Reactions   Morphine And Related Nausea And Vomiting and Other (See Comments)    Headache (also)   Tape Rash    Can only tolerate for short amounts of time (CANNOT BE "LEFT" ON THE SAME SITE FOR LONG)    Immunization History  Administered Date(s) Administered   Influenza Split 04/08/2008, 01/23/2020   Influenza, High Dose Seasonal PF 01/27/2018, 03/07/2019   Influenza-Unspecified 01/30/2018, 02/03/2020   Moderna Sars-Covid-2 Vaccination 08/18/2019, 09/15/2019   Pneumococcal Polysaccharide-23 03/03/2015    Past Medical  History:  Diagnosis Date   Anxiety    Arthritis    generalized   Asthma    Bleeding nose    Cancer ([place]) 2017   Lung    COPD (chronic obstructive pulmonary disease) (HCC)    Diabetes mellitus    Diverticulitis    Dyspnea    Dysrhythmia    paraxysmol atrial fibrillation    H. pylori infection    Heart murmur    mild AS (03/16/20 echo)   Hypertension    Liver disease    Lung nodule 2017   Both lungs[facility].    Neuromuscular disorder ([place])    bilateral feet   Parkinson disease 2010    Sleep apnea    Tubular adenoma of colon 05/2007    Tobacco History: Social History   Tobacco Use  Smoking Status Former   Packs/day: 1.00   Years: 28.00   Total pack years: 28.00   Types: Cigarettes   Quit date: 03/20/1987   Years since quitting: 35.1  Smokeless Tobacco Never   Counseling given: Not Answered   Outpatient Medications Prior to Visit  Medication Sig Dispense Refill   albuterol (PROVENTIL) (2.5 MG/3ML) 0.083% nebulizer solution Take 3 mLs (2.5 mg total) by nebulization every 6 (six) hours as needed for wheezing or shortness of breath. 75 mL 12   albuterol (VENTOLIN HFA) 108 (90 Base) MCG/ACT inhaler Inhale 2 puffs into the lungs every 6 (six) hours as needed for wheezing or shortness of breath. 18 g 11   Alpha-Lipoic Acid 600 MG CAPS Take by mouth daily.     ALPRAZolam (XANAX) 1 MG tablet Take 1 tablet (1 mg total) 2 (two) times daily as needed by mouth for anxiety. 10 tablet 0   atorvastatin (LIPITOR) 10 MG tablet Take 10 mg by mouth daily.     carbidopa-levodopa (SINEMET IR) 25-100 MG tablet Take 1 tablet by mouth 2 (two) times daily.     Cholecalciferol (VITAMIN D3) 125 MCG (5000 UT) CAPS Take 5,000 Units by mouth daily.     diltiazem (CARDIZEM CD) 120 MG 24 hr capsule TAKE 1 CAPSULE(120 MG) BY MOUTH DAILY 90 capsule 2   fentaNYL (DURAGESIC - DOSED MCG/HR) 100 MCG/HR Place 100 mcg onto the skin every other day.     fentaNYL (DURAGESIC) 25 MCG/HR Place 1 patch onto the skin every other day.     fluticasone (FLONASE) 50 MCG/ACT nasal spray as needed.     furosemide (LASIX) 40 MG tablet Take 1 tablet (40 mg total) by mouth daily. 90 tablet 3   HUMALOG KWIKPEN 100 UNIT/ML KiwkPen Inject 0.12 mLs (12 Units total) into the skin 3 (three) times daily before meals. Along with your normal sliding scale. (Patient taking differently: Inject 10-20 Units into the skin 3 (three) times daily before meals. Along with your normal sliding scale.) 15 mL 11   hydrocortisone (CORTEF) 5  MG tablet Take 10-15 mg by mouth See admin instructions. Take 3 tablets (15 mg) in the morning and 2 tablets (10 mg) every night.     insulin glargine (LANTUS) 100 UNIT/ML injection Inject 40 Units into the skin 2 (two) times daily.     metoprolol tartrate (LOPRESSOR) 100 MG tablet Take 2 hours prior to Cardiac CT 1 tablet 0   nitroGLYCERIN (NITROSTAT) 0.4 MG SL tablet PLACE AND DISSOLVE ONE TABLET UNDER TONGUE AS NEEDED FOR CHEST PAIN( EVERY 5 MINUTES, MAY TAKE UP TO 3 TIMES DAILY) 25 tablet 10   omeprazole (PRILOSEC) 20  MG capsule Take 20 mg by mouth daily as needed (acid reflux).     Oxycodone HCl 10 MG TABS Take 10 mg by mouth every 2 (two) hours as needed for pain.     pregabalin (LYRICA) 50 MG capsule Take 50 mg by mouth 2 (two) times daily.     pregabalin (LYRICA) 75 MG capsule Take 75 mg by mouth at bedtime.     promethazine (PHENERGAN) 12.5 MG tablet Take 12.5 mg by mouth every 6 (six) hours as needed for nausea or vomiting.     sertraline (ZOLOFT) 100 MG tablet Take 100 mg by mouth daily.     TRELEGY ELLIPTA 100-62.5-25 MCG/INH AEPB Take 1 puff by mouth daily. 60 each 5   No facility-administered medications prior to visit.     Review of Systems:   Constitutional: No weight loss or gain, night sweats, fevers, chills, or lassitude. +chronic fatigue HEENT: No headaches, difficulty swallowing, tooth/dental problems, or sore throat. No sneezing, itching, ear ache, nasal congestion, or post nasal drip CV:  +PND. No chest pain, orthopnea, swelling in lower extremities, anasarca, dizziness, palpitations, syncope Resp: +shortness of breath with exertion (baseline). No excess mucus or change in color of mucus. No productive or non-productive. No hemoptysis. No wheezing.  No chest wall deformity GI:  No heartburn, indigestion, abdominal pain, loss of appetite GU: No dysuria, change in color of urine, urgency or frequency. Skin: No rash, lesions, ulcerations MSK:  No joint pain or swelling.    Neuro: No dizziness or lightheadedness.  Psych: No depression or anxiety. Mood stable.     Physical Exam:  BP 118/70   Pulse 84   Ht 5\' 3"  (1.6 m)   Wt 260 lb 6.4 oz (118.1 kg)   SpO2 96%   BMI 46.13 kg/m   GEN: Pleasant, interactive, well-appearing; in no acute distress HEENT:  Normocephalic and atraumatic. PERRLA. Sclera white. Nasal turbinates pink, moist and patent bilaterally. No rhinorrhea present. Oropharynx pink and moist, without exudate or edema. No lesions, ulcerations, or postnasal drip.  NECK:  Supple w/ fair ROM. No JVD present. Normal carotid impulses w/o bruits. Thyroid symmetrical with no goiter or nodules palpated. No lymphadenopathy.   CV: RRR, no m/r/g, no peripheral edema. Pulses intact, +2 bilaterally. No cyanosis, pallor or clubbing. PULMONARY:  Unlabored, regular breathing. Clear bilaterally A&P w/o wheezes/rales/rhonchi. No accessory muscle use.  GI: BS present and normoactive. Soft, non-tender to palpation. No organomegaly or masses detected. MSK: No erythema, warmth or tenderness. Cap refil <2 sec all extrem. No deformities or joint swelling noted.  Neuro: A/Ox3. No focal deficits noted.   Skin: Warm, no lesions or rashe Psych: Normal affect and behavior. Judgement and thought content appropriate.     Lab Results:  CBC    Component Value Date/Time   WBC 9.8 09/04/2021 1734   RBC 4.76 09/04/2021 1734   HGB 12.6 09/04/2021 1734   HGB 11.8 02/20/2020 0933   HCT 39.3 09/04/2021 1734   HCT 36.4 02/20/2020 0933   PLT 232 09/04/2021 1734   PLT 252 02/20/2020 0933   MCV 82.6 09/04/2021 1734   MCV 86 02/20/2020 0933   MCH 26.5 09/04/2021 1734   MCHC 32.1 09/04/2021 1734   RDW 15.1 09/04/2021 1734   RDW 14.1 02/20/2020 0933   LYMPHSABS 1.1 08/24/2020 1528   LYMPHSABS 1.4 02/20/2020 0933   MONOABS 0.3 08/24/2020 1528   EOSABS 0.1 08/24/2020 1528   EOSABS 0.2 02/20/2020 0933   BASOSABS 0.0 08/24/2020 1528  BASOSABS 0.0 02/20/2020 0933     BMET    Component Value Date/Time   NA 144 09/16/2021 1048   K 4.2 09/16/2021 1048   CL 103 09/16/2021 1048   CO2 25 09/16/2021 1048   GLUCOSE 276 (H) 09/16/2021 1048   GLUCOSE 150 (H) 09/04/2021 1734   BUN 27 09/16/2021 1048   CREATININE 1.06 (H) 09/16/2021 1048   CALCIUM 9.0 09/16/2021 1048   GFRNONAA 60 (L) 09/04/2021 1734   GFRAA 78 02/20/2020 0933    BNP    Component Value Date/Time   BNP 40.1 06/19/2017 1237     Imaging:  No results found.        No data to display          No results found for: "NITRICOXIDE"      Assessment & Plan:   COPD (chronic obstructive pulmonary disease) (HCC) COPD; compensated on current regimen with triple therapy regimen. No recent exacerbations requiring steroids/abx or hospitalizations. She seems to be having increased PND due to poor control of OSA with malfunction of machine, not AECOPD. Advised her to notify if symptoms worsen or do not improve with new CPAP. COPD action plan in place.  Patient Instructions  Continue Albuterol inhaler 2 puffs or 3 mL neb every 6 hours as needed for shortness of breath or wheezing. Notify if symptoms persist despite rescue inhaler/neb use.  Continue Trelegy 1 puff daily. Brush tongue and rinse mouth afterwards Continue supplemental oxygen 2 lpm as needed for goal >88-90%  Continue to use CPAP every night, minimum of 4-6 hours a night.  Change equipment every 30 days or as directed by DME. Wash your tubing with warm soap and water daily, hang to dry. Wash humidifier portion weekly.  Be aware of reduced alertness and do not drive or operate heavy machinery if experiencing this or drowsiness.  Exercise encouraged, as tolerated. Notify if persistent daytime sleepiness occurs even with consistent use of CPAP.  Orders sent for new machine today  Guaifenesin [phone] mg Twice daily as needed for chest congestion  Follow up in 3 months with Dr. Shearon Stalls. If symptoms worsen, please contact  office for sooner follow up or seek emergency care.    OSA on CPAP Excellent compliance and receives good benefit from use. Download shows that over the past 2 weeks, she has had increased breakthrough events. No significant change in leaks average. Machine sounds like it is at the end of it's lifespan. We will send orders for urgent new machine; set pressure 13 cmH2O. Cautioned on safe driving practices.   Non-small cell carcinoma of lung, stage 4 (HCC) Under surveillance. Followed by Dr. Georgeanna Lea with [facility].   I spent 35 minutes of dedicated to the care of this patient on the date of this encounter to include pre-visit review of records, face-to-face time with the patient discussing conditions above, post visit ordering of testing, clinical documentation with the electronic health record, making appropriate referrals as documented, and communicating necessary findings to members of the patients care team.  Clayton Bibles, NP 05/02/2022  Pt aware and understands NP's role.

## 2022-05-02 ENCOUNTER — Encounter: Payer: Self-pay | Admitting: Nurse Practitioner

## 2022-05-02 DIAGNOSIS — G4733 Obstructive sleep apnea (adult) (pediatric): Secondary | ICD-10-CM | POA: Insufficient documentation

## 2022-05-02 NOTE — Assessment & Plan Note (Signed)
Excellent compliance and receives good benefit from use. Download shows that over the past 2 weeks, she has had increased breakthrough events. No significant change in leaks average. Machine sounds like it is at the end of it's lifespan. We will send orders for urgent new machine; set pressure 13 cmH2O. Cautioned on safe driving practices.

## 2022-05-02 NOTE — Assessment & Plan Note (Addendum)
COPD; compensated on current regimen with triple therapy regimen. No recent exacerbations requiring steroids/abx or hospitalizations. She seems to be having increased PND due to poor control of OSA with malfunction of machine, not AECOPD. Advised her to notify if symptoms worsen or do not improve with new CPAP. COPD action plan in place.  Patient Instructions  Continue Albuterol inhaler 2 puffs or 3 mL neb every 6 hours as needed for shortness of breath or wheezing. Notify if symptoms persist despite rescue inhaler/neb use.  Continue Trelegy 1 puff daily. Brush tongue and rinse mouth afterwards Continue supplemental oxygen 2 lpm as needed for goal >88-90%  Continue to use CPAP every night, minimum of 4-6 hours a night.  Change equipment every 30 days or as directed by DME. Wash your tubing with warm soap and water daily, hang to dry. Wash humidifier portion weekly.  Be aware of reduced alertness and do not drive or operate heavy machinery if experiencing this or drowsiness.  Exercise encouraged, as tolerated. Notify if persistent daytime sleepiness occurs even with consistent use of CPAP.  Orders sent for new machine today  Guaifenesin [phone] mg Twice daily as needed for chest congestion  Follow up in 3 months with Dr. Shearon Stalls. If symptoms worsen, please contact office for sooner follow up or seek emergency care.

## 2022-05-02 NOTE — Assessment & Plan Note (Signed)
Under surveillance. Followed by Dr. Georgeanna Lea with [facility].

## 2022-05-11 DIAGNOSIS — G4733 Obstructive sleep apnea (adult) (pediatric): Secondary | ICD-10-CM | POA: Diagnosis not present

## 2022-06-08 DIAGNOSIS — J219 Acute bronchiolitis, unspecified: Secondary | ICD-10-CM | POA: Diagnosis not present

## 2022-06-08 DIAGNOSIS — R530 Neoplastic (malignant) related fatigue: Secondary | ICD-10-CM | POA: Diagnosis not present

## 2022-06-08 DIAGNOSIS — R251 Tremor, unspecified: Secondary | ICD-10-CM | POA: Diagnosis not present

## 2022-06-08 DIAGNOSIS — Z79899 Other long term (current) drug therapy: Secondary | ICD-10-CM | POA: Diagnosis not present

## 2022-06-08 DIAGNOSIS — C3411 Malignant neoplasm of upper lobe, right bronchus or lung: Secondary | ICD-10-CM | POA: Diagnosis not present

## 2022-06-08 DIAGNOSIS — E119 Type 2 diabetes mellitus without complications: Secondary | ICD-10-CM | POA: Diagnosis not present

## 2022-06-08 DIAGNOSIS — G893 Neoplasm related pain (acute) (chronic): Secondary | ICD-10-CM | POA: Diagnosis not present

## 2022-06-08 DIAGNOSIS — R2681 Unsteadiness on feet: Secondary | ICD-10-CM | POA: Diagnosis not present

## 2022-06-08 DIAGNOSIS — E2740 Unspecified adrenocortical insufficiency: Secondary | ICD-10-CM | POA: Diagnosis not present

## 2022-06-08 DIAGNOSIS — K7581 Nonalcoholic steatohepatitis (NASH): Secondary | ICD-10-CM | POA: Diagnosis not present

## 2022-06-08 DIAGNOSIS — G4733 Obstructive sleep apnea (adult) (pediatric): Secondary | ICD-10-CM | POA: Diagnosis not present

## 2022-06-08 DIAGNOSIS — I10 Essential (primary) hypertension: Secondary | ICD-10-CM | POA: Diagnosis not present

## 2022-06-08 DIAGNOSIS — E118 Type 2 diabetes mellitus with unspecified complications: Secondary | ICD-10-CM | POA: Diagnosis not present

## 2022-06-08 DIAGNOSIS — K5903 Drug induced constipation: Secondary | ICD-10-CM | POA: Diagnosis not present

## 2022-06-08 DIAGNOSIS — C7951 Secondary malignant neoplasm of bone: Secondary | ICD-10-CM | POA: Diagnosis not present

## 2022-06-08 DIAGNOSIS — Z87891 Personal history of nicotine dependence: Secondary | ICD-10-CM | POA: Diagnosis not present

## 2022-06-11 DIAGNOSIS — G4733 Obstructive sleep apnea (adult) (pediatric): Secondary | ICD-10-CM | POA: Diagnosis not present

## 2022-07-10 DIAGNOSIS — G4733 Obstructive sleep apnea (adult) (pediatric): Secondary | ICD-10-CM | POA: Diagnosis not present

## 2022-07-19 DIAGNOSIS — K219 Gastro-esophageal reflux disease without esophagitis: Secondary | ICD-10-CM | POA: Diagnosis not present

## 2022-07-19 DIAGNOSIS — E1165 Type 2 diabetes mellitus with hyperglycemia: Secondary | ICD-10-CM | POA: Diagnosis not present

## 2022-07-19 DIAGNOSIS — E785 Hyperlipidemia, unspecified: Secondary | ICD-10-CM | POA: Diagnosis not present

## 2022-07-19 DIAGNOSIS — N1831 Chronic kidney disease, stage 3a: Secondary | ICD-10-CM | POA: Diagnosis not present

## 2022-07-19 DIAGNOSIS — J449 Chronic obstructive pulmonary disease, unspecified: Secondary | ICD-10-CM | POA: Diagnosis not present

## 2022-07-20 ENCOUNTER — Encounter: Payer: Self-pay | Admitting: Neurology

## 2022-07-20 ENCOUNTER — Ambulatory Visit: Payer: Medicare Other | Admitting: Neurology

## 2022-07-20 VITALS — BP 142/78 | HR 75 | Ht 63.0 in | Wt 261.4 lb

## 2022-07-20 DIAGNOSIS — G250 Essential tremor: Secondary | ICD-10-CM

## 2022-07-20 DIAGNOSIS — R2689 Other abnormalities of gait and mobility: Secondary | ICD-10-CM | POA: Diagnosis not present

## 2022-07-20 DIAGNOSIS — H9211 Otorrhea, right ear: Secondary | ICD-10-CM | POA: Diagnosis not present

## 2022-07-20 DIAGNOSIS — H9201 Otalgia, right ear: Secondary | ICD-10-CM | POA: Diagnosis not present

## 2022-07-20 NOTE — Progress Notes (Signed)
Subjective:    Patient ID: [name] is a 77 y.o. female.  HPI    Star Age, MD, PhD [facility] [address], [address] [place], [place] [address]  Dear Shanon Brow,  I saw your patient, [name], upon your kind request in my neurologic clinic today for initial consultation of her tremors and balance issues.  The patient is accompanied by.  As you know, [name] is a 77 year old female with an underlying complex medical history of liver disease, hypertension, metastatic lung cancer (followed by [facility] oncology), COPD, asthma, sleep apnea (on CPAP therapy), use of supplemental oxygen, anxiety, arthritis, diverticulitis, diabetes, and severe obesity with a BMI of over 40, who reports a longstanding history of tremors affecting her entire body, particularly her hands.  Tremor fluctuates but has overall become worse over time.  In the past year her daughter has noticed progression and particularly so in the last 6 to 8 months.  The patient has not fallen recently but has been off balance.  She has a cane and 2 different types of walkers but typically does not use any walking aid, at home, she holds onto things.  She tries to hydrate well, does not drink any alcohol, limits her caffeine to 1 cup of coffee in the morning.  She uses her CPAP religiously.    She reports that her brother has Parkinson's disease, daughter adds that patient's brother had DBS surgery.   She reports that her dad and her sister have tremors.  She does not remember her paternal grandparents.  Daughter has noticed memory loss as well.  She is wondering if her memory is affected by her medications.  We talked about this as well today.  Of note, she is on multiple medications, including psychotropic and high risk medication such as Xanax 1 mg 3 times a day as needed, fentanyl patch, Ritalin, oxycodone, Lyrica, sertraline.  She is also on carbidopa-levodopa long-acting 50-200 mg  strength 1 pill at bedtime.  She had previously seen Dr. Carles Collet at [facility] neurology in 2014.  I reviewed the last office visit note from 10/04/2012.  She had been on levodopa therapy but had been advised to stop it as she was felt to have essential tremor rather than parkinsonism.  She was treated with primidone in the past for her tremor.  She had a brain MRI with and without contrast through the [facility] on 11/28/2017 and I reviewed the results: IMPRESSION:  1. No evidence of intracranial metastatic disease.   2. Unchanged enhancing foci in the right parietal bone, likely metastatic  disease.   She had a brain MRI with and without contrast through the [place] [facility] on 07/18/2016 and I reviewed the results:  IMPRESSION:  1. No acute intracranial abnormality. No evidence of intracranial  metastatic disease.  2. Unchanged abnormal enhancement within the right parietal bone consistent with osseous metastatic disease.    She had a remote brain MRI with and without contrast on 03/25/2012 and I reviewed the results: Impression: Mild atrophy.  Mild chronic microvascular ischemic changes.  No acute or focal intracranial abnormality.  She is currently on carbidopa levodopa, she reports that she is taking the 25-100 mg strength twice daily.  She has been on levodopa CR 50-200 mg strength per your report, 1 pill at bedtime.  She is not sure if she has taken it or not.  Her Past Medical History Is Significant For: Past Medical History:  Diagnosis Date  Anxiety    Arthritis    generalized   Asthma    Bleeding nose    Cancer ([place]) 2017   Lung    COPD (chronic obstructive pulmonary disease) (HCC)    Diabetes mellitus    Diverticulitis    Dyspnea    Dysrhythmia    paraxysmol atrial fibrillation    H. pylori infection    Heart murmur    mild AS (03/16/20 echo)   Hypertension    Liver disease    Lung cancer ([place])    stage 4 , with mets to bone.   Lung nodule 2017   Both  lungs[facility] at [facility].    Neuromuscular disorder ([place])    bilateral feet   Parkinson disease 2010   Sleep apnea    Tubular adenoma of colon 05/2007    Her Past Surgical History Is Significant For: Past Surgical History:  Procedure Laterality Date   APPENDECTOMY     BREAST SURGERY     implants at age [age] (removed 68 years later)   CHOLECYSTECTOMY     COLON SURGERY     colectomy and reversal after diverticular ds   PARTIAL COLECTOMY     PERICARDIAL WINDOW N/A 02/28/2017   Procedure: PERICARDIAL WINDOW;  Surgeon: [name], MD;  Location: [facility] OR;  Service: Thoracic;  Laterality: N/A;   TONSILLECTOMY     TOTAL KNEE ARTHROPLASTY     right   VAGINAL HYSTERECTOMY      Her Family History Is Significant For: Family History  Problem Relation Age of Onset   Cancer - Lung Mother    Diabetes Mellitus II Sister    Cancer - Lung Brother    Diabetes Mellitus II Son    Colon cancer Neg Hx     Her Social History Is Significant For: Social History   Socioeconomic History   Marital status: Single    Spouse name: Not on file   Number of children: 5   Years of education: Not on file   Highest education level: Not on file  Occupational History   Occupation: retired    Comment: Charity fundraiser; [employer]  Tobacco Use   Smoking status: Former    Packs/day: 1.00    Years: 28.00    Additional pack years: 0.00    Total pack years: 28.00    Types: Cigarettes    Quit date: 03/20/1987    Years since quitting: 35.3   Smokeless tobacco: Never  Vaping Use   Vaping Use: Never used  Substance and Sexual Activity   Alcohol use: Yes    Comment: 2 margaritas a year   Drug use: No   Sexual activity: Never  Other Topics Concern   Not on file  Social History Narrative   Caffiene 16 oz coffee every am   Retired:  [employer]   5 kids.     Social Determinants of Health   Financial Resource Strain: Not on file  Food Insecurity: Not on file  Transportation Needs: Not on file   Physical Activity: Not on file  Stress: Not on file  Social Connections: Not on file    Her Allergies Are:  Allergies  Allergen Reactions   Morphine And Related Nausea And Vomiting and Other (See Comments)    Headache (also)   Tape Rash    Can only tolerate for short amounts of time (CANNOT BE "LEFT" ON THE [place])  :   Her Current Medications Are:  Outpatient Encounter  Medications as of 07/20/2022  Medication Sig   albuterol (PROVENTIL) (2.5 MG/3ML) 0.083% nebulizer solution Take 3 mLs (2.5 mg total) by nebulization every 6 (six) hours as needed for wheezing or shortness of breath.   albuterol (VENTOLIN HFA) 108 (90 Base) MCG/ACT inhaler Inhale 2 puffs into the lungs every 6 (six) hours as needed for wheezing or shortness of breath.   Alpha-Lipoic Acid 600 MG CAPS Take by mouth daily.   ALPRAZolam (XANAX) 1 MG tablet Take 1 tablet (1 mg total) 2 (two) times daily as needed by mouth for anxiety.   atorvastatin (LIPITOR) 10 MG tablet Take 10 mg by mouth daily.   carbidopa-levodopa (SINEMET IR) 25-100 MG tablet Take 1 tablet by mouth 2 (two) times daily.   Cholecalciferol (VITAMIN D3) 125 MCG (5000 UT) CAPS Take 5,000 Units by mouth daily.   diltiazem (CARDIZEM CD) 120 MG 24 hr capsule TAKE 1 CAPSULE(120 MG) BY MOUTH DAILY   fentaNYL (DURAGESIC - DOSED MCG/HR) 100 MCG/HR Place 100 mcg onto the skin every other day.   fentaNYL (DURAGESIC) 25 MCG/HR Place 1 patch onto the skin every other day.   fluticasone (FLONASE) 50 MCG/ACT nasal spray as needed.   furosemide (LASIX) 40 MG tablet Take 1 tablet (40 mg total) by mouth daily.   HUMALOG KWIKPEN 100 UNIT/ML KiwkPen Inject 0.12 mLs (12 Units total) into the skin 3 (three) times daily before meals. Along with your normal sliding scale. (Patient taking differently: Inject 10-20 Units into the skin 3 (three) times daily before meals. Along with your normal sliding scale.)   hydrocortisone (CORTEF) 5 MG tablet Take 10-15 mg by mouth  See admin instructions. Take 3 tablets (15 mg) in the morning and 2 tablets (10 mg) every night.   insulin glargine (LANTUS) 100 UNIT/ML injection Inject 40 Units into the skin 2 (two) times daily.   metoprolol tartrate (LOPRESSOR) 100 MG tablet Take 2 hours prior to Cardiac CT   nitroGLYCERIN (NITROSTAT) 0.4 MG SL tablet PLACE AND DISSOLVE ONE TABLET UNDER TONGUE AS NEEDED FOR CHEST PAIN( EVERY 5 MINUTES, MAY TAKE UP TO 3 TIMES DAILY)   omeprazole (PRILOSEC) 20 MG capsule Take 20 mg by mouth daily as needed (acid reflux).   Oxycodone HCl 10 MG TABS Take 10 mg by mouth every 2 (two) hours as needed for pain.   pregabalin (LYRICA) 50 MG capsule Take 50 mg by mouth 2 (two) times daily.   pregabalin (LYRICA) 75 MG capsule Take 75 mg by mouth at bedtime.   promethazine (PHENERGAN) 12.5 MG tablet Take 12.5 mg by mouth every 6 (six) hours as needed for nausea or vomiting.   sertraline (ZOLOFT) 100 MG tablet Take 100 mg by mouth daily.   TRELEGY ELLIPTA 100-62.5-25 MCG/INH AEPB Take 1 puff by mouth daily.   No facility-administered encounter medications on file as of 07/20/2022.  :   Review of Systems:  Out of a complete 14 point review of systems, all are reviewed and negative with the exception of these symptoms as listed below:   Review of Systems  Neurological:        Pt here for tremors/ balance issues.  She noted worsening of tremors in the last 6-8- months. Tremor sheet done.    Objective:  Neurological Exam  Physical Exam Physical Examination:   Vitals:   07/20/22 1442 07/20/22 1457  BP: (!) 127/105 (!) 142/78  Pulse: 79 75  SpO2:  94%    General Examination: The patient is a very pleasant  77 y.o. female in no acute distress. She appears frail and deconditioned.  Came in via wheelchair but sits in a regular chair in the clinic room.  Upon recheck, her diastolic blood pressure improved.  HEENT: Normocephalic, atraumatic, pupils are equal, round and reactive to light,  extraocular tracking is preserved, she has a coarse head tremor.  She has an intermittent voice tremor.  Airway examination reveals mild mouth dryness, tongue protrudes centrally and palate elevates symmetrically, no orofacial or lingual dyskinesias.  She has ear pain and reports drainage from her right ear.  Examination reveals mildly opaque drainage, no evidence of ear tube.  Right tympanic membrane hazy, no blisters.  Left ear canal benign and left tympanic membrane benign.   No carotid bruits.    Chest: Clear to auscultation with distant breath sounds.    Heart: S1+S2+0, regular and normal without murmurs, rubs or gallops noted.   Abdomen: Soft, non-tender and non-distended.  Extremities: There is mild swelling both lower extremities.    Skin: Warm and dry without trophic changes noted.   Musculoskeletal: exam reveals no obvious joint deformities.  Reports left knee pain and generalized pain.  Neurologically:  Mental status: The patient is awake, alert and oriented.  Details of her history of provided by her daughter.  Thought process is linear. Mood is normal and affect is blunted.  Cranial nerves II - XII are as described above under HEENT exam.  Motor exam: Normal bulk, global strength of about 4 out of 5, reflexes diminished throughout.  She has no obvious tremor in the lower extremities, no significant resting tremor in the upper extremities.  She has a moderate postural and action tremor in both upper extremities, no lateralization.  Handwriting is tremulous and difficult to read, not particularly micrographic, Archimedes spiral drawing shows moderate to severe trembling with both hands.   Fine motor skills and coordination: Impaired bilaterally in the upper extremities with no lateralization.    Cerebellar testing: No dysmetria or intention tremor. There is no truncal or gait ataxia.  Sensory exam: intact to light touch in the upper and lower extremities.  Gait, station and balance:  She stands with difficulty and pushes herself up.  She stands wide-based.  She has no walking aid.  She walks in securely, no obvious shuffling, she has insecurity with turns.  Mildly reduced arm swing bilaterally.   Assessment and Plan:  In summary, HAIDYN COPLEY is a very pleasant 77 y.o.-year old female with an underlying complex medical history of liver disease, hypertension, metastatic lung cancer (followed by [facility] oncology), COPD, asthma, sleep apnea (on CPAP therapy), use of supplemental oxygen, anxiety, arthritis, diverticulitis, diabetes, and severe obesity with a BMI of over 40, who presents for evaluation of her tremors of many years duration.  History, examination and family history support essential tremor.  She has become worse over time.  Contributing factors likely include stress and anxiety as well as potential exacerbation through medications.  Sertraline is one of the medications that can exacerbate tremors.  We had a long discussion about tremor control today and unfortunately there is not a whole lot I can offer her today.  She has previously tried primidone.  She is not a good candidate for beta-blocker given her obstructive lung disease.  She is advised to use her walker for her gait instability and balance problem.  She has no evidence of parkinsonism and I would not recommend using levodopa therapy for her.  She is apparently taking levodopa IR  25-100 mg strength twice daily, not Sinemet CR 50-200 mg strength at bedtime.  In my opinion, she does not need to be on levodopa therapy.  She has multiple medications that could affect her balance adversely and she is advised to use her walker consistently even at home as she is likely at high fall risk.  She is reminded to stay well-hydrated with water, avoid caffeine and alcohol.  She is advised to follow-up as needed and encouraged to talk to you about medication management, she is furthermore advised to follow-up with her other providers  including her oncologist.  She is advised to seek evaluation for her right ear pain and drainage.  She may need antibiotic treatment, she is advised to see her PCP as soon as possible or go to urgent care for this.  They demonstrated understanding. I answered all their questions today and the patient and her daughter were in agreement.   Thank you very much for allowing me to weigh in. If I can be of any further assistance to you please do not hesitate to call me at [phone].  Sincerely,   Star Age, MD, PhD  This was an extended visit of over 1 hour with extended chart review involved.

## 2022-07-20 NOTE — Patient Instructions (Addendum)
I do not believe you have signs and symptoms of Parkinson's disease.  I would not recommend Parkinson's medication.  Please talk to your prescriber about tapering off your Parkinson's medication.  I would not recommend any other medication for tremor control as you have tried medications before.  You are not a good candidate for beta-blocker as it can worsen your obstructive lung disease.  You are balance is impaired and you are at risk of falls.  I recommend that you use your walker at all times.  Please try to stay well-hydrated and avoid caffeine and alcohol altogether.  I would not recommend you go without your walker.  As discussed, your balance is most likely impaired secondary to multiple factors including several medications that can affect your balance.  Please follow-up with your current primary care and other providers including your oncologist.  Please see your primary care soon as possible for your right ear pain and drainage, if you cannot see your primary care within a day, please go to urgent care, you may need antibiotic treatment for your ear drainage, possible ear infection.

## 2022-07-25 DIAGNOSIS — I480 Paroxysmal atrial fibrillation: Secondary | ICD-10-CM | POA: Diagnosis not present

## 2022-07-25 DIAGNOSIS — J9611 Chronic respiratory failure with hypoxia: Secondary | ICD-10-CM | POA: Diagnosis not present

## 2022-07-25 DIAGNOSIS — G9520 Unspecified cord compression: Secondary | ICD-10-CM | POA: Diagnosis not present

## 2022-07-25 DIAGNOSIS — D8481 Immunodeficiency due to conditions classified elsewhere: Secondary | ICD-10-CM | POA: Diagnosis not present

## 2022-07-25 DIAGNOSIS — R519 Headache, unspecified: Secondary | ICD-10-CM | POA: Diagnosis not present

## 2022-07-25 DIAGNOSIS — K7460 Unspecified cirrhosis of liver: Secondary | ICD-10-CM | POA: Diagnosis not present

## 2022-07-25 DIAGNOSIS — J449 Chronic obstructive pulmonary disease, unspecified: Secondary | ICD-10-CM | POA: Diagnosis not present

## 2022-07-25 DIAGNOSIS — E1165 Type 2 diabetes mellitus with hyperglycemia: Secondary | ICD-10-CM | POA: Diagnosis not present

## 2022-07-25 DIAGNOSIS — E1142 Type 2 diabetes mellitus with diabetic polyneuropathy: Secondary | ICD-10-CM | POA: Diagnosis not present

## 2022-07-25 DIAGNOSIS — N1831 Chronic kidney disease, stage 3a: Secondary | ICD-10-CM | POA: Diagnosis not present

## 2022-07-26 ENCOUNTER — Other Ambulatory Visit: Payer: Self-pay | Admitting: Family Medicine

## 2022-07-26 DIAGNOSIS — R251 Tremor, unspecified: Secondary | ICD-10-CM

## 2022-07-26 DIAGNOSIS — R42 Dizziness and giddiness: Secondary | ICD-10-CM

## 2022-07-26 DIAGNOSIS — R519 Headache, unspecified: Secondary | ICD-10-CM

## 2022-07-26 DIAGNOSIS — H9313 Tinnitus, bilateral: Secondary | ICD-10-CM

## 2022-07-26 DIAGNOSIS — G629 Polyneuropathy, unspecified: Secondary | ICD-10-CM

## 2022-08-08 DIAGNOSIS — K219 Gastro-esophageal reflux disease without esophagitis: Secondary | ICD-10-CM | POA: Diagnosis not present

## 2022-08-08 DIAGNOSIS — J449 Chronic obstructive pulmonary disease, unspecified: Secondary | ICD-10-CM | POA: Diagnosis not present

## 2022-08-08 DIAGNOSIS — E1165 Type 2 diabetes mellitus with hyperglycemia: Secondary | ICD-10-CM | POA: Diagnosis not present

## 2022-08-08 DIAGNOSIS — E785 Hyperlipidemia, unspecified: Secondary | ICD-10-CM | POA: Diagnosis not present

## 2022-08-08 DIAGNOSIS — N1831 Chronic kidney disease, stage 3a: Secondary | ICD-10-CM | POA: Diagnosis not present

## 2022-08-10 DIAGNOSIS — G4733 Obstructive sleep apnea (adult) (pediatric): Secondary | ICD-10-CM | POA: Diagnosis not present

## 2022-08-26 ENCOUNTER — Ambulatory Visit
Admission: RE | Admit: 2022-08-26 | Discharge: 2022-08-26 | Disposition: A | Discharge status: Home / Self Care | Payer: Medicare Other | Source: Ambulatory Visit | Attending: Family Medicine | Admitting: Family Medicine

## 2022-08-26 DIAGNOSIS — R42 Dizziness and giddiness: Secondary | ICD-10-CM | POA: Diagnosis not present

## 2022-08-26 DIAGNOSIS — R519 Headache, unspecified: Secondary | ICD-10-CM

## 2022-08-26 DIAGNOSIS — R251 Tremor, unspecified: Secondary | ICD-10-CM

## 2022-08-26 DIAGNOSIS — R413 Other amnesia: Secondary | ICD-10-CM | POA: Diagnosis not present

## 2022-08-26 DIAGNOSIS — H9313 Tinnitus, bilateral: Secondary | ICD-10-CM

## 2022-08-26 DIAGNOSIS — G629 Polyneuropathy, unspecified: Secondary | ICD-10-CM

## 2022-08-26 MED ORDER — GADOPICLENOL 0.5 MMOL/ML IV SOLN
10.0000 mL | Freq: Once | INTRAVENOUS | Status: AC | PRN
  Administered 2022-08-26: 10 mL via INTRAVENOUS

## 2022-08-27 ENCOUNTER — Other Ambulatory Visit: Payer: Self-pay | Admitting: Internal Medicine

## 2022-09-09 DIAGNOSIS — G4733 Obstructive sleep apnea (adult) (pediatric): Secondary | ICD-10-CM | POA: Diagnosis not present

## 2022-09-13 DIAGNOSIS — M1712 Unilateral primary osteoarthritis, left knee: Secondary | ICD-10-CM | POA: Diagnosis not present

## 2022-09-14 DIAGNOSIS — M792 Neuralgia and neuritis, unspecified: Secondary | ICD-10-CM | POA: Diagnosis not present

## 2022-09-14 DIAGNOSIS — H66001 Acute suppurative otitis media without spontaneous rupture of ear drum, right ear: Secondary | ICD-10-CM | POA: Diagnosis not present

## 2022-09-14 DIAGNOSIS — M4727 Other spondylosis with radiculopathy, lumbosacral region: Secondary | ICD-10-CM | POA: Diagnosis not present

## 2022-09-14 DIAGNOSIS — R52 Pain, unspecified: Secondary | ICD-10-CM | POA: Diagnosis not present

## 2022-09-14 DIAGNOSIS — M2550 Pain in unspecified joint: Secondary | ICD-10-CM | POA: Diagnosis not present

## 2022-09-14 DIAGNOSIS — R530 Neoplastic (malignant) related fatigue: Secondary | ICD-10-CM | POA: Diagnosis not present

## 2022-09-14 DIAGNOSIS — M5442 Lumbago with sciatica, left side: Secondary | ICD-10-CM | POA: Diagnosis not present

## 2022-09-14 DIAGNOSIS — T402X5A Adverse effect of other opioids, initial encounter: Secondary | ICD-10-CM | POA: Diagnosis not present

## 2022-09-14 DIAGNOSIS — H6991 Unspecified Eustachian tube disorder, right ear: Secondary | ICD-10-CM | POA: Diagnosis not present

## 2022-09-14 DIAGNOSIS — G8929 Other chronic pain: Secondary | ICD-10-CM | POA: Diagnosis not present

## 2022-09-14 DIAGNOSIS — K5903 Drug induced constipation: Secondary | ICD-10-CM | POA: Diagnosis not present

## 2022-09-28 DIAGNOSIS — H6991 Unspecified Eustachian tube disorder, right ear: Secondary | ICD-10-CM | POA: Diagnosis not present

## 2022-10-06 DIAGNOSIS — E1165 Type 2 diabetes mellitus with hyperglycemia: Secondary | ICD-10-CM | POA: Diagnosis not present

## 2022-10-06 DIAGNOSIS — K219 Gastro-esophageal reflux disease without esophagitis: Secondary | ICD-10-CM | POA: Diagnosis not present

## 2022-10-06 DIAGNOSIS — J449 Chronic obstructive pulmonary disease, unspecified: Secondary | ICD-10-CM | POA: Diagnosis not present

## 2022-10-06 DIAGNOSIS — N1831 Chronic kidney disease, stage 3a: Secondary | ICD-10-CM | POA: Diagnosis not present

## 2022-10-06 DIAGNOSIS — E785 Hyperlipidemia, unspecified: Secondary | ICD-10-CM | POA: Diagnosis not present

## 2022-10-31 DIAGNOSIS — E785 Hyperlipidemia, unspecified: Secondary | ICD-10-CM | POA: Diagnosis not present

## 2022-10-31 DIAGNOSIS — G629 Polyneuropathy, unspecified: Secondary | ICD-10-CM | POA: Diagnosis not present

## 2022-10-31 DIAGNOSIS — N1831 Chronic kidney disease, stage 3a: Secondary | ICD-10-CM | POA: Diagnosis not present

## 2022-10-31 DIAGNOSIS — E559 Vitamin D deficiency, unspecified: Secondary | ICD-10-CM | POA: Diagnosis not present

## 2022-10-31 DIAGNOSIS — E1122 Type 2 diabetes mellitus with diabetic chronic kidney disease: Secondary | ICD-10-CM | POA: Diagnosis not present

## 2022-10-31 DIAGNOSIS — J449 Chronic obstructive pulmonary disease, unspecified: Secondary | ICD-10-CM | POA: Diagnosis not present

## 2022-10-31 DIAGNOSIS — K219 Gastro-esophageal reflux disease without esophagitis: Secondary | ICD-10-CM | POA: Diagnosis not present

## 2022-10-31 DIAGNOSIS — I480 Paroxysmal atrial fibrillation: Secondary | ICD-10-CM | POA: Diagnosis not present

## 2022-10-31 DIAGNOSIS — E538 Deficiency of other specified B group vitamins: Secondary | ICD-10-CM | POA: Diagnosis not present

## 2022-10-31 DIAGNOSIS — E1165 Type 2 diabetes mellitus with hyperglycemia: Secondary | ICD-10-CM | POA: Diagnosis not present

## 2022-10-31 DIAGNOSIS — Z794 Long term (current) use of insulin: Secondary | ICD-10-CM | POA: Diagnosis not present

## 2022-10-31 DIAGNOSIS — E1142 Type 2 diabetes mellitus with diabetic polyneuropathy: Secondary | ICD-10-CM | POA: Diagnosis not present

## 2022-10-31 DIAGNOSIS — Z0000 Encounter for general adult medical examination without abnormal findings: Secondary | ICD-10-CM | POA: Diagnosis not present

## 2022-10-31 DIAGNOSIS — J9611 Chronic respiratory failure with hypoxia: Secondary | ICD-10-CM | POA: Diagnosis not present

## 2022-10-31 DIAGNOSIS — K7581 Nonalcoholic steatohepatitis (NASH): Secondary | ICD-10-CM | POA: Diagnosis not present

## 2022-11-19 ENCOUNTER — Other Ambulatory Visit: Payer: Self-pay | Admitting: Internal Medicine

## 2022-11-19 DIAGNOSIS — D509 Iron deficiency anemia, unspecified: Secondary | ICD-10-CM

## 2022-11-19 DIAGNOSIS — I3139 Other pericardial effusion (noninflammatory): Secondary | ICD-10-CM

## 2022-11-19 DIAGNOSIS — G4733 Obstructive sleep apnea (adult) (pediatric): Secondary | ICD-10-CM

## 2022-11-19 DIAGNOSIS — C3490 Malignant neoplasm of unspecified part of unspecified bronchus or lung: Secondary | ICD-10-CM

## 2022-11-19 DIAGNOSIS — E119 Type 2 diabetes mellitus without complications: Secondary | ICD-10-CM

## 2022-11-19 DIAGNOSIS — I480 Paroxysmal atrial fibrillation: Secondary | ICD-10-CM

## 2022-11-22 DIAGNOSIS — M1712 Unilateral primary osteoarthritis, left knee: Secondary | ICD-10-CM | POA: Diagnosis not present

## 2022-11-29 DIAGNOSIS — M1712 Unilateral primary osteoarthritis, left knee: Secondary | ICD-10-CM | POA: Diagnosis not present

## 2022-12-06 DIAGNOSIS — M1712 Unilateral primary osteoarthritis, left knee: Secondary | ICD-10-CM | POA: Diagnosis not present

## 2022-12-13 DIAGNOSIS — M1712 Unilateral primary osteoarthritis, left knee: Secondary | ICD-10-CM | POA: Diagnosis not present

## 2022-12-15 ENCOUNTER — Other Ambulatory Visit: Payer: Self-pay | Admitting: Internal Medicine

## 2022-12-21 ENCOUNTER — Encounter (INDEPENDENT_AMBULATORY_CARE_PROVIDER_SITE_OTHER): Payer: Medicare Other | Admitting: Physician Assistant

## 2023-01-08 IMAGING — DX DG CHEST 2V
2 series · 2 of 2 positions shown · non-contrast
Comparison: January 20, 2020

CLINICAL DATA: Bronchitis.  COPD exacerbation.

EXAM:
CHEST - 2 VIEW

[chest pa]
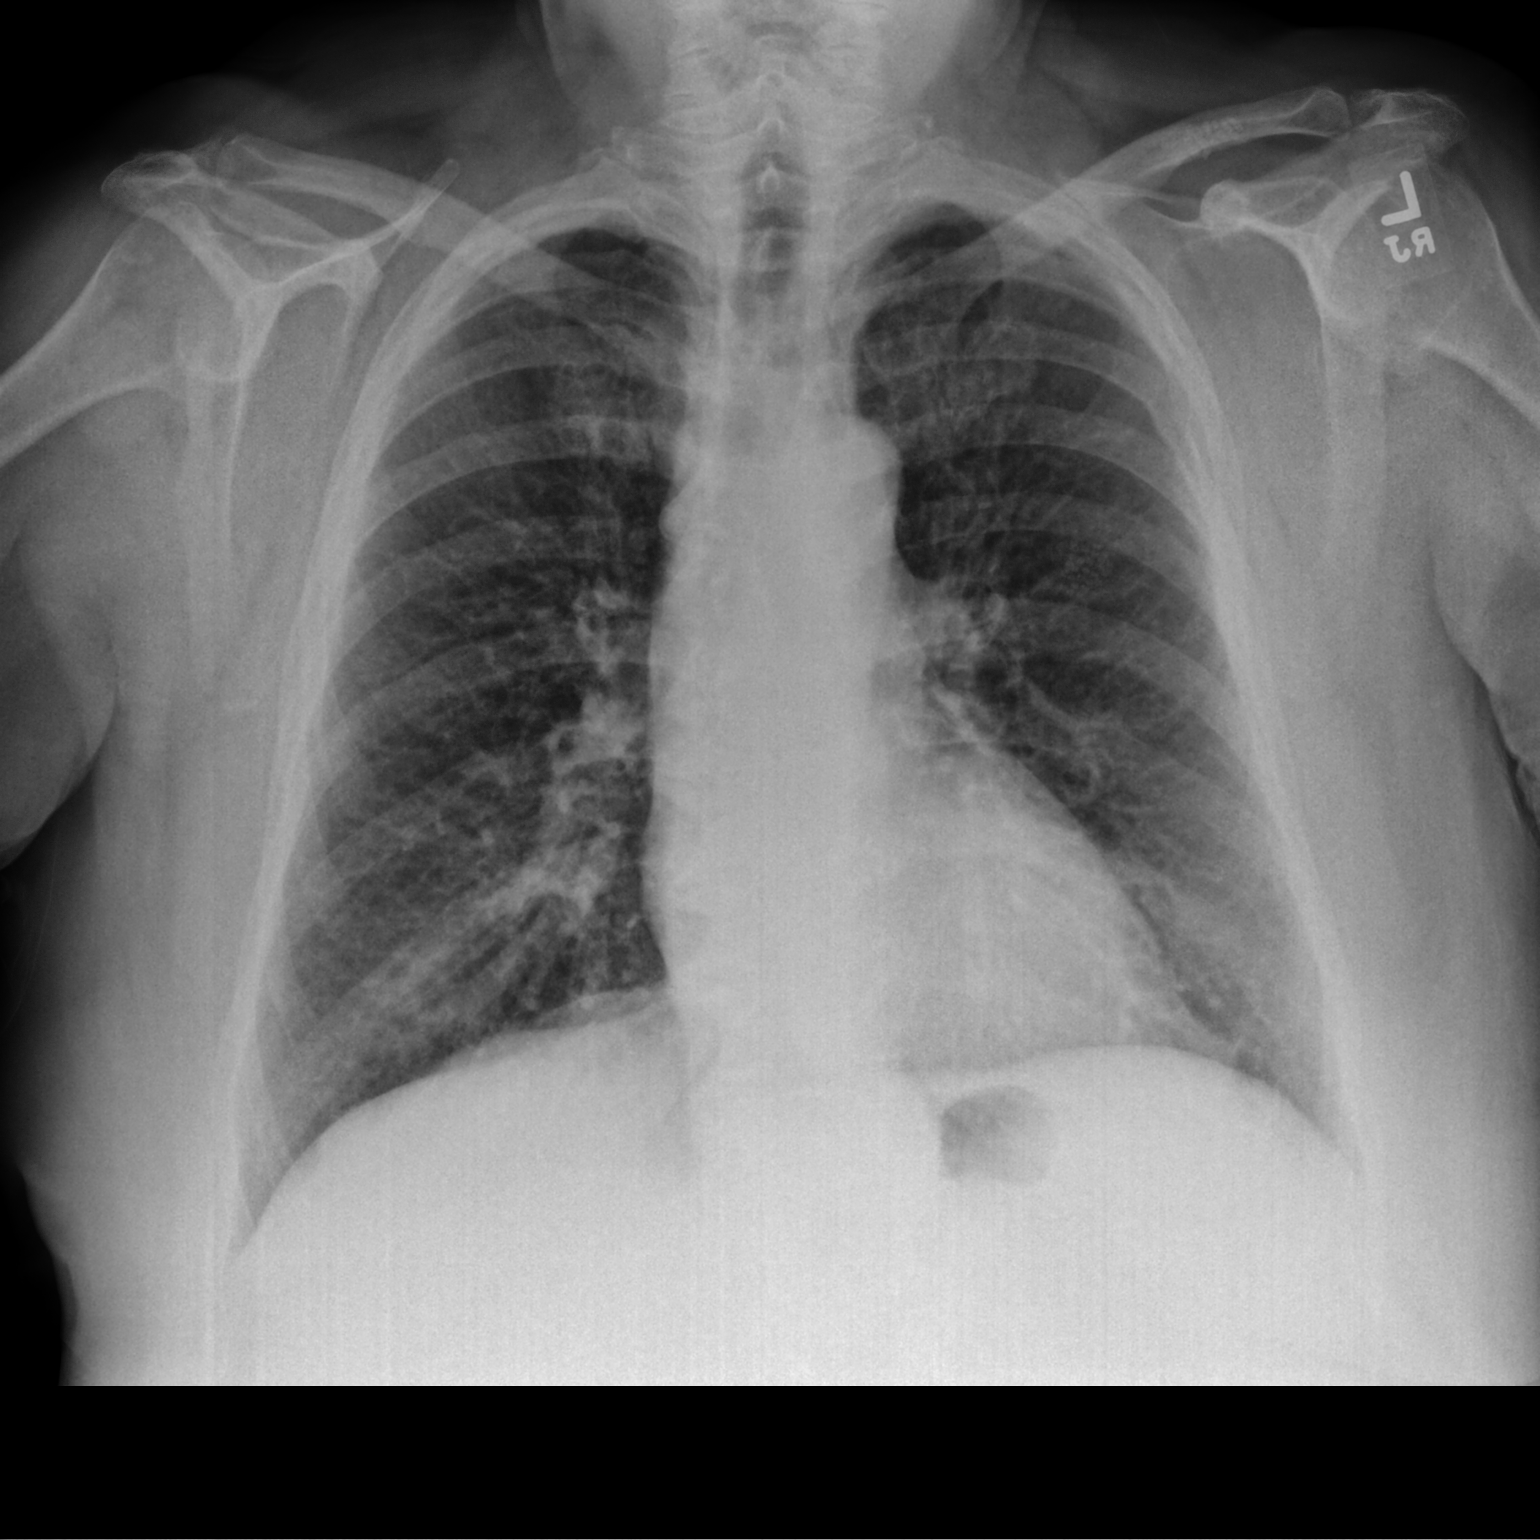

[chest lat]
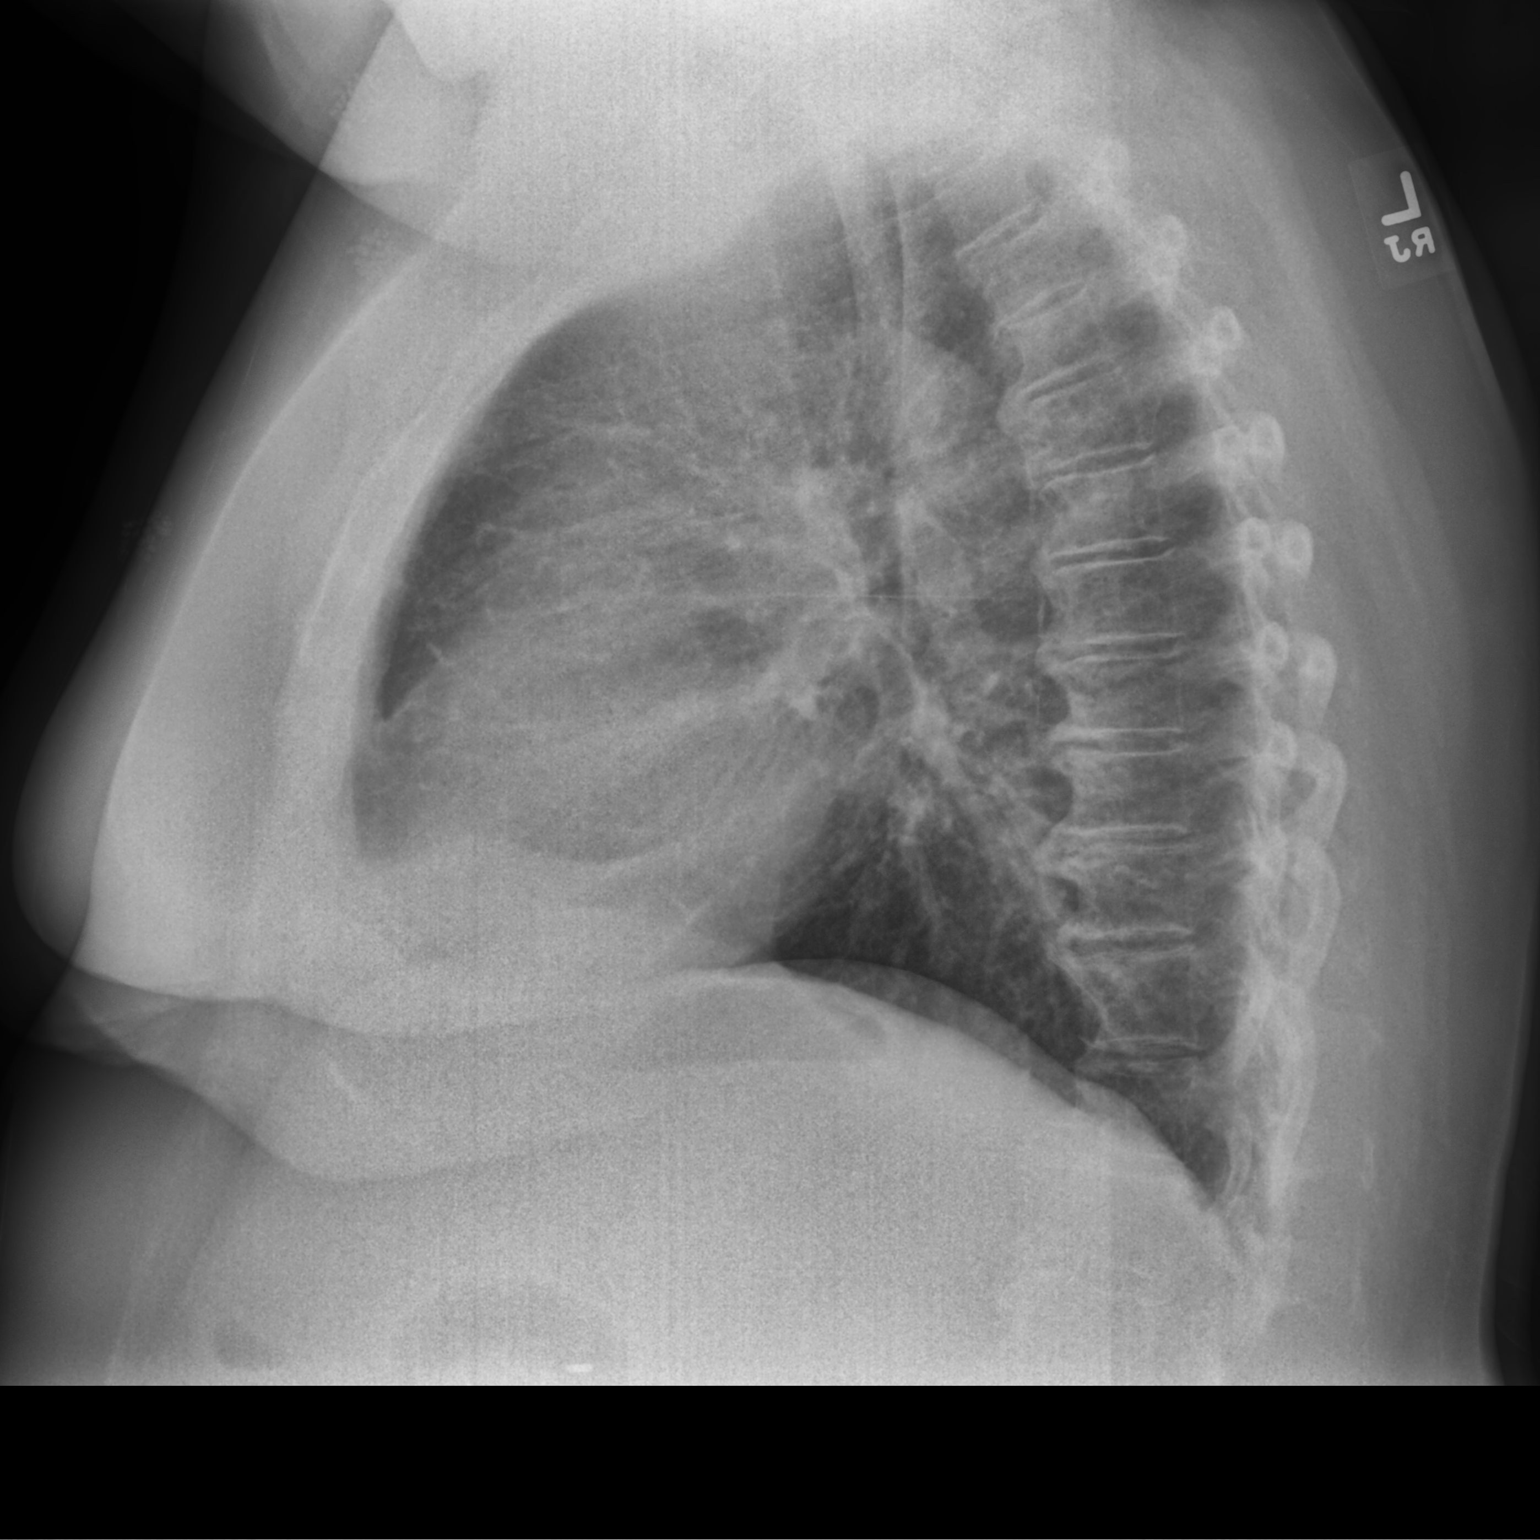

[2 of 2 positions shown; findings below may reference images not displayed]

FINDINGS: The heart size and mediastinal contours are within normal limits.
Both lungs are clear. The visualized skeletal structures are
unremarkable.
IMPRESSION: No active cardiopulmonary disease.

## 2023-01-29 ENCOUNTER — Telehealth: Payer: Self-pay | Admitting: Internal Medicine

## 2023-01-29 ENCOUNTER — Other Ambulatory Visit (HOSPITAL_COMMUNITY): Payer: Self-pay

## 2023-01-29 ENCOUNTER — Other Ambulatory Visit: Payer: Self-pay | Admitting: Internal Medicine

## 2023-01-29 MED ORDER — FUROSEMIDE 40 MG PO TABS
40.0000 mg | ORAL_TABLET | Freq: Every day | ORAL | 0 refills | Status: DC
  Filled 2023-01-29: qty 30, 30d supply, fill #0

## 2023-01-29 MED ORDER — FUROSEMIDE 40 MG PO TABS: 40.0000 mg | ORAL_TABLET | Freq: Every day | ORAL | 0 refills | Status: DC

## 2023-01-29 NOTE — Telephone Encounter (Signed)
*  STAT* If patient is at the pharmacy, call can be transferred to refill team.   1. Which medications need to be refilled? (please list name of each medication and dose if known) furosemide (LASIX) 40 MG tablet    2. Would you like to learn more about the convenience, safety, & potential cost savings by using the [facility]? No   3. Are you open to using the [facility] (Type [facility]. No   4. Which pharmacy/location (including street and city if local pharmacy) is medication to be sent to?  [id number] - [place], [place] - [address] AT [facility] OF [address]     5. Do they need a 30 day or 90 day supply? Needs enough medication to last her until 11/04 appt

## 2023-01-29 NOTE — Telephone Encounter (Signed)
Pt's medication was sent to pt's pharmacy as requested. Confirmation received.  °

## 2023-01-29 NOTE — Addendum Note (Signed)
Addended by: Margaret Pyle D on: 01/29/2023 02:20 PM   Modules accepted: Orders

## 2023-01-30 NOTE — Telephone Encounter (Signed)
Patient stated she wants prescription re-sent to [id number] - [place], [place] - [address] AT [facility] OF [address] as they told her "they were waiting on a response from her doctor's office".  Patient has an appointment scheduled on 11/4 and she is completely out of this medication.

## 2023-01-30 NOTE — Telephone Encounter (Signed)
Pt's medication has already been sent to pt's pharmacy and the pharmacy has been called as well. Pt's medication will be ready for pick up this afternoon.

## 2023-01-31 ENCOUNTER — Other Ambulatory Visit: Payer: Self-pay

## 2023-01-31 ENCOUNTER — Inpatient Hospital Stay (HOSPITAL_COMMUNITY)
Admission: EM | Admit: 2023-01-31 | Discharge: 2023-02-04 | DRG: 291 | Disposition: A | Discharge status: Home / Self Care | Payer: Medicare Other | Attending: Internal Medicine | Admitting: Internal Medicine

## 2023-01-31 ENCOUNTER — Encounter (HOSPITAL_COMMUNITY): Payer: Self-pay

## 2023-01-31 ENCOUNTER — Emergency Department (HOSPITAL_COMMUNITY): Payer: Medicare Other

## 2023-01-31 DIAGNOSIS — I130 Hypertensive heart and chronic kidney disease with heart failure and stage 1 through stage 4 chronic kidney disease, or unspecified chronic kidney disease: Principal | ICD-10-CM | POA: Diagnosis present

## 2023-01-31 DIAGNOSIS — I5033 Acute on chronic diastolic (congestive) heart failure: Secondary | ICD-10-CM | POA: Diagnosis not present

## 2023-01-31 DIAGNOSIS — Z860101 Personal history of adenomatous and serrated colon polyps: Secondary | ICD-10-CM

## 2023-01-31 DIAGNOSIS — Z7951 Long term (current) use of inhaled steroids: Secondary | ICD-10-CM

## 2023-01-31 DIAGNOSIS — M1990 Unspecified osteoarthritis, unspecified site: Secondary | ICD-10-CM | POA: Diagnosis present

## 2023-01-31 DIAGNOSIS — Z23 Encounter for immunization: Secondary | ICD-10-CM

## 2023-01-31 DIAGNOSIS — J441 Chronic obstructive pulmonary disease with (acute) exacerbation: Secondary | ICD-10-CM | POA: Diagnosis present

## 2023-01-31 DIAGNOSIS — I10 Essential (primary) hypertension: Secondary | ICD-10-CM | POA: Insufficient documentation

## 2023-01-31 DIAGNOSIS — Z888 Allergy status to other drugs, medicaments and biological substances status: Secondary | ICD-10-CM

## 2023-01-31 DIAGNOSIS — F419 Anxiety disorder, unspecified: Secondary | ICD-10-CM | POA: Diagnosis present

## 2023-01-31 DIAGNOSIS — E871 Hypo-osmolality and hyponatremia: Secondary | ICD-10-CM | POA: Diagnosis not present

## 2023-01-31 DIAGNOSIS — T380X5A Adverse effect of glucocorticoids and synthetic analogues, initial encounter: Secondary | ICD-10-CM | POA: Diagnosis present

## 2023-01-31 DIAGNOSIS — R918 Other nonspecific abnormal finding of lung field: Secondary | ICD-10-CM | POA: Diagnosis not present

## 2023-01-31 DIAGNOSIS — D509 Iron deficiency anemia, unspecified: Secondary | ICD-10-CM | POA: Diagnosis not present

## 2023-01-31 DIAGNOSIS — E119 Type 2 diabetes mellitus without complications: Secondary | ICD-10-CM | POA: Diagnosis not present

## 2023-01-31 DIAGNOSIS — G20A1 Parkinson's disease without dyskinesia, without mention of fluctuations: Secondary | ICD-10-CM | POA: Diagnosis not present

## 2023-01-31 DIAGNOSIS — C3490 Malignant neoplasm of unspecified part of unspecified bronchus or lung: Secondary | ICD-10-CM | POA: Diagnosis not present

## 2023-01-31 DIAGNOSIS — I110 Hypertensive heart disease with heart failure: Secondary | ICD-10-CM | POA: Diagnosis not present

## 2023-01-31 DIAGNOSIS — J9621 Acute and chronic respiratory failure with hypoxia: Secondary | ICD-10-CM | POA: Diagnosis not present

## 2023-01-31 DIAGNOSIS — E1142 Type 2 diabetes mellitus with diabetic polyneuropathy: Secondary | ICD-10-CM | POA: Diagnosis not present

## 2023-01-31 DIAGNOSIS — Z79899 Other long term (current) drug therapy: Secondary | ICD-10-CM

## 2023-01-31 DIAGNOSIS — C7951 Secondary malignant neoplasm of bone: Secondary | ICD-10-CM | POA: Diagnosis not present

## 2023-01-31 DIAGNOSIS — G4730 Sleep apnea, unspecified: Secondary | ICD-10-CM | POA: Diagnosis present

## 2023-01-31 DIAGNOSIS — Z96651 Presence of right artificial knee joint: Secondary | ICD-10-CM | POA: Diagnosis present

## 2023-01-31 DIAGNOSIS — R0602 Shortness of breath: Secondary | ICD-10-CM | POA: Diagnosis not present

## 2023-01-31 DIAGNOSIS — Z885 Allergy status to narcotic agent status: Secondary | ICD-10-CM

## 2023-01-31 DIAGNOSIS — E1122 Type 2 diabetes mellitus with diabetic chronic kidney disease: Secondary | ICD-10-CM | POA: Diagnosis not present

## 2023-01-31 DIAGNOSIS — I509 Heart failure, unspecified: Secondary | ICD-10-CM | POA: Diagnosis not present

## 2023-01-31 DIAGNOSIS — F32A Depression, unspecified: Secondary | ICD-10-CM | POA: Diagnosis present

## 2023-01-31 DIAGNOSIS — E1165 Type 2 diabetes mellitus with hyperglycemia: Secondary | ICD-10-CM | POA: Diagnosis not present

## 2023-01-31 DIAGNOSIS — N1831 Chronic kidney disease, stage 3a: Secondary | ICD-10-CM | POA: Diagnosis not present

## 2023-01-31 DIAGNOSIS — Z7982 Long term (current) use of aspirin: Secondary | ICD-10-CM

## 2023-01-31 DIAGNOSIS — Z833 Family history of diabetes mellitus: Secondary | ICD-10-CM

## 2023-01-31 DIAGNOSIS — J9601 Acute respiratory failure with hypoxia: Principal | ICD-10-CM

## 2023-01-31 DIAGNOSIS — N179 Acute kidney failure, unspecified: Secondary | ICD-10-CM | POA: Diagnosis not present

## 2023-01-31 DIAGNOSIS — Z801 Family history of malignant neoplasm of trachea, bronchus and lung: Secondary | ICD-10-CM

## 2023-01-31 DIAGNOSIS — G4733 Obstructive sleep apnea (adult) (pediatric): Secondary | ICD-10-CM | POA: Diagnosis present

## 2023-01-31 DIAGNOSIS — I3139 Other pericardial effusion (noninflammatory): Secondary | ICD-10-CM

## 2023-01-31 DIAGNOSIS — J449 Chronic obstructive pulmonary disease, unspecified: Secondary | ICD-10-CM | POA: Diagnosis not present

## 2023-01-31 DIAGNOSIS — Z794 Long term (current) use of insulin: Secondary | ICD-10-CM

## 2023-01-31 DIAGNOSIS — Z6841 Body Mass Index (BMI) 40.0 and over, adult: Secondary | ICD-10-CM | POA: Diagnosis not present

## 2023-01-31 DIAGNOSIS — Z90710 Acquired absence of both cervix and uterus: Secondary | ICD-10-CM

## 2023-01-31 DIAGNOSIS — I480 Paroxysmal atrial fibrillation: Secondary | ICD-10-CM | POA: Diagnosis not present

## 2023-01-31 DIAGNOSIS — Z1152 Encounter for screening for COVID-19: Secondary | ICD-10-CM | POA: Diagnosis not present

## 2023-01-31 DIAGNOSIS — Z85118 Personal history of other malignant neoplasm of bronchus and lung: Secondary | ICD-10-CM

## 2023-01-31 DIAGNOSIS — Z87891 Personal history of nicotine dependence: Secondary | ICD-10-CM

## 2023-01-31 DIAGNOSIS — D631 Anemia in chronic kidney disease: Secondary | ICD-10-CM | POA: Diagnosis present

## 2023-01-31 DIAGNOSIS — Z9049 Acquired absence of other specified parts of digestive tract: Secondary | ICD-10-CM

## 2023-01-31 DIAGNOSIS — I517 Cardiomegaly: Secondary | ICD-10-CM | POA: Diagnosis not present

## 2023-01-31 DIAGNOSIS — Z7984 Long term (current) use of oral hypoglycemic drugs: Secondary | ICD-10-CM

## 2023-01-31 DIAGNOSIS — I4510 Unspecified right bundle-branch block: Secondary | ICD-10-CM | POA: Diagnosis present

## 2023-01-31 LAB — COMPREHENSIVE METABOLIC PANEL
ALT: 11 U/L (ref 0–44)
AST: 9 U/L — ABNORMAL LOW (ref 15–41)
Albumin: 3.2 g/dL — ABNORMAL LOW (ref 3.5–5.0)
Alkaline Phosphatase: 75 U/L (ref 38–126)
Anion gap: 10 (ref 5–15)
BUN: 21 mg/dL (ref 8–23)
CO2: 24 mmol/L (ref 22–32)
Calcium: 8.7 mg/dL — ABNORMAL LOW (ref 8.9–10.3)
Chloride: 104 mmol/L (ref 98–111)
Creatinine, Ser: 1.07 mg/dL — ABNORMAL HIGH (ref 0.44–1.00)
GFR, Estimated: 54 mL/min — ABNORMAL LOW (ref >60)
Glucose, Bld: 145 mg/dL — ABNORMAL HIGH (ref 70–99)
Potassium: 4.1 mmol/L (ref 3.5–5.1)
Sodium: 138 mmol/L (ref 135–145)
Total Bilirubin: 0.5 mg/dL (ref 0.3–1.2)
Total Protein: 6.8 g/dL (ref 6.5–8.1)

## 2023-01-31 LAB — RESP PANEL BY RT-PCR (RSV, FLU A&B, COVID)  RVPGX2
Influenza A by PCR: NEGATIVE
Influenza B by PCR: NEGATIVE
Resp Syncytial Virus by PCR: NEGATIVE
SARS Coronavirus 2 by RT PCR: NEGATIVE

## 2023-01-31 LAB — CBC
HCT: 33.2 % — ABNORMAL LOW (ref 36.0–46.0)
Hemoglobin: 10.5 g/dL — ABNORMAL LOW (ref 12.0–15.0)
MCH: 27.1 pg (ref 26.0–34.0)
MCHC: 31.6 g/dL (ref 30.0–36.0)
MCV: 85.6 fL (ref 80.0–100.0)
Platelets: 210 10*3/uL (ref 150–400)
RBC: 3.88 MIL/uL (ref 3.87–5.11)
RDW: 14.6 % (ref 11.5–15.5)
WBC: 9.1 10*3/uL (ref 4.0–10.5)
nRBC: 0 % (ref 0.0–0.2)

## 2023-01-31 LAB — CBG MONITORING, ED: Glucose-Capillary: 347 mg/dL — ABNORMAL HIGH (ref 70–99)

## 2023-01-31 LAB — BRAIN NATRIURETIC PEPTIDE: B Natriuretic Peptide: 147.6 pg/mL — ABNORMAL HIGH (ref 0.0–100.0)

## 2023-01-31 MED ORDER — ALPRAZOLAM 0.5 MG PO TABS: 1.0000 mg | ORAL_TABLET | Freq: Every day | ORAL | Status: DC | PRN

## 2023-01-31 MED ORDER — FENTANYL 100 MCG/HR TD PT72: 1.0000 | MEDICATED_PATCH | TRANSDERMAL | Status: DC

## 2023-01-31 MED ORDER — ACETAMINOPHEN 325 MG PO TABS: 650.0000 mg | ORAL_TABLET | Freq: Four times a day (QID) | ORAL | Status: DC | PRN

## 2023-01-31 MED ORDER — IPRATROPIUM BROMIDE 0.02 % IN SOLN
0.5000 mg | Freq: Once | RESPIRATORY_TRACT | Status: AC
  Administered 2023-01-31: 0.5 mg via RESPIRATORY_TRACT
  Filled 2023-01-31: qty 2.5

## 2023-01-31 MED ORDER — SODIUM CHLORIDE 0.9 % IV SOLN
1.0000 g | INTRAVENOUS | Status: DC
  Administered 2023-01-31 – 2023-02-01 (×2): 1 g via INTRAVENOUS
  Filled 2023-01-31 (×3): qty 10

## 2023-01-31 MED ORDER — ALPRAZOLAM 0.5 MG PO TABS: 1.0000 mg | ORAL_TABLET | ORAL | Status: DC

## 2023-01-31 MED ORDER — HYDROCORTISONE 5 MG PO TABS
15.0000 mg | ORAL_TABLET | Freq: Every day | ORAL | Status: DC
  Administered 2023-02-01 – 2023-02-04 (×4): 15 mg via ORAL
  Filled 2023-01-31 (×4): qty 1

## 2023-01-31 MED ORDER — HYDROCORTISONE 10 MG PO TABS
10.0000 mg | ORAL_TABLET | Freq: Every day | ORAL | Status: DC
  Administered 2023-01-31 – 2023-02-03 (×4): 10 mg via ORAL
  Filled 2023-01-31 (×4): qty 1

## 2023-01-31 MED ORDER — FUROSEMIDE 10 MG/ML IJ SOLN
40.0000 mg | Freq: Two times a day (BID) | INTRAMUSCULAR | Status: DC
  Administered 2023-01-31 – 2023-02-04 (×8): 40 mg via INTRAVENOUS
  Filled 2023-01-31 (×8): qty 4

## 2023-01-31 MED ORDER — INSULIN GLARGINE-YFGN 100 UNIT/ML ~~LOC~~ SOLN
40.0000 [IU] | Freq: Every day | SUBCUTANEOUS | Status: DC
  Administered 2023-01-31 – 2023-02-01 (×2): 40 [IU] via SUBCUTANEOUS
  Filled 2023-01-31 (×2): qty 0.4

## 2023-01-31 MED ORDER — ALPRAZOLAM 0.5 MG PO TABS
1.0000 mg | ORAL_TABLET | Freq: Two times a day (BID) | ORAL | Status: DC
  Administered 2023-01-31 – 2023-02-03 (×5): 1 mg via ORAL
  Filled 2023-01-31 (×8): qty 2

## 2023-01-31 MED ORDER — POTASSIUM CHLORIDE 20 MEQ PO PACK
20.0000 meq | PACK | Freq: Once | ORAL | Status: AC
  Administered 2023-01-31: 20 meq via ORAL
  Filled 2023-01-31: qty 1

## 2023-01-31 MED ORDER — MAGNESIUM HYDROXIDE 400 MG/5ML PO SUSP: 30.0000 mL | Freq: Every day | ORAL | Status: DC | PRN

## 2023-01-31 MED ORDER — OXYCODONE HCL 20 MG PO TABS: 20.0000 mg | ORAL_TABLET | ORAL | Status: DC | PRN

## 2023-01-31 MED ORDER — FENTANYL 100 MCG/HR TD PT72
1.0000 | MEDICATED_PATCH | TRANSDERMAL | Status: DC
  Administered 2023-02-01 – 2023-02-03 (×2): 1 via TRANSDERMAL
  Filled 2023-01-31 (×2): qty 1

## 2023-01-31 MED ORDER — FENTANYL 25 MCG/HR TD PT72: 1.0000 | MEDICATED_PATCH | TRANSDERMAL | Status: DC

## 2023-01-31 MED ORDER — ONDANSETRON HCL 4 MG PO TABS: 4.0000 mg | ORAL_TABLET | Freq: Four times a day (QID) | ORAL | Status: DC | PRN

## 2023-01-31 MED ORDER — VITAMIN D 25 MCG (1000 UNIT) PO TABS
5000.0000 [IU] | ORAL_TABLET | Freq: Every day | ORAL | Status: DC
  Administered 2023-02-01 – 2023-02-04 (×4): 5000 [IU] via ORAL
  Filled 2023-01-31 (×4): qty 5

## 2023-01-31 MED ORDER — PREGABALIN 75 MG PO CAPS
75.0000 mg | ORAL_CAPSULE | Freq: Every day | ORAL | Status: DC
  Administered 2023-01-31 – 2023-02-03 (×4): 75 mg via ORAL
  Filled 2023-01-31 (×4): qty 1

## 2023-01-31 MED ORDER — MAGNESIUM SULFATE 2 GM/50ML IV SOLN
2.0000 g | INTRAVENOUS | Status: AC
  Administered 2023-01-31: 2 g via INTRAVENOUS
  Filled 2023-01-31: qty 50

## 2023-01-31 MED ORDER — METHYLPREDNISOLONE SODIUM SUCC 40 MG IJ SOLR
40.0000 mg | Freq: Three times a day (TID) | INTRAMUSCULAR | Status: DC
  Administered 2023-01-31 – 2023-02-01 (×2): 40 mg via INTRAVENOUS
  Filled 2023-01-31 (×2): qty 1

## 2023-01-31 MED ORDER — DILTIAZEM HCL ER COATED BEADS 120 MG PO CP24
120.0000 mg | ORAL_CAPSULE | Freq: Every day | ORAL | Status: DC
  Administered 2023-02-01 – 2023-02-04 (×4): 120 mg via ORAL
  Filled 2023-01-31 (×4): qty 1

## 2023-01-31 MED ORDER — HYDROCOD POLI-CHLORPHE POLI ER 10-8 MG/5ML PO SUER: 5.0000 mL | Freq: Two times a day (BID) | ORAL | Status: DC | PRN

## 2023-01-31 MED ORDER — ALBUTEROL SULFATE (2.5 MG/3ML) 0.083% IN NEBU
INHALATION_SOLUTION | RESPIRATORY_TRACT | Status: AC
  Filled 2023-01-31: qty 3

## 2023-01-31 MED ORDER — HYDROCORTISONE 10 MG PO TABS: 10.0000 mg | ORAL_TABLET | ORAL | Status: DC

## 2023-01-31 MED ORDER — INSULIN GLARGINE-YFGN 100 UNIT/ML ~~LOC~~ SOLN: 40.0000 [IU] | SUBCUTANEOUS | Status: DC

## 2023-01-31 MED ORDER — ONDANSETRON HCL 4 MG/2ML IJ SOLN: 4.0000 mg | Freq: Four times a day (QID) | INTRAMUSCULAR | Status: DC | PRN

## 2023-01-31 MED ORDER — ONDANSETRON HCL 4 MG PO TABS: 8.0000 mg | ORAL_TABLET | Freq: Three times a day (TID) | ORAL | Status: DC | PRN

## 2023-01-31 MED ORDER — ACETAMINOPHEN 650 MG RE SUPP: 650.0000 mg | Freq: Four times a day (QID) | RECTAL | Status: DC | PRN

## 2023-01-31 MED ORDER — TRAZODONE HCL 50 MG PO TABS: 25.0000 mg | ORAL_TABLET | Freq: Every evening | ORAL | Status: DC | PRN

## 2023-01-31 MED ORDER — NITROGLYCERIN 0.4 MG SL SUBL: 0.4000 mg | SUBLINGUAL_TABLET | SUBLINGUAL | Status: DC | PRN

## 2023-01-31 MED ORDER — PANTOPRAZOLE SODIUM 40 MG PO TBEC
40.0000 mg | DELAYED_RELEASE_TABLET | Freq: Every day | ORAL | Status: DC
  Administered 2023-02-01 – 2023-02-04 (×4): 40 mg via ORAL
  Filled 2023-01-31 (×4): qty 1

## 2023-01-31 MED ORDER — INSULIN GLARGINE-YFGN 100 UNIT/ML ~~LOC~~ SOLN
60.0000 [IU] | Freq: Every day | SUBCUTANEOUS | Status: DC
  Filled 2023-01-31 (×2): qty 0.6

## 2023-01-31 MED ORDER — FENTANYL 25 MCG/HR TD PT72
1.0000 | MEDICATED_PATCH | TRANSDERMAL | Status: DC
  Administered 2023-02-01: 1 via TRANSDERMAL
  Filled 2023-01-31: qty 1

## 2023-01-31 MED ORDER — ALBUTEROL SULFATE (2.5 MG/3ML) 0.083% IN NEBU
5.0000 mg | INHALATION_SOLUTION | Freq: Once | RESPIRATORY_TRACT | Status: AC
  Administered 2023-01-31: 5 mg via RESPIRATORY_TRACT
  Filled 2023-01-31: qty 6

## 2023-01-31 MED ORDER — ASPIRIN 81 MG PO CHEW: 81.0000 mg | CHEWABLE_TABLET | ORAL | Status: DC | PRN

## 2023-01-31 MED ORDER — IPRATROPIUM-ALBUTEROL 0.5-2.5 (3) MG/3ML IN SOLN
3.0000 mL | Freq: Four times a day (QID) | RESPIRATORY_TRACT | Status: DC
  Administered 2023-01-31 – 2023-02-01 (×2): 3 mL via RESPIRATORY_TRACT
  Filled 2023-01-31 (×2): qty 3

## 2023-01-31 MED ORDER — OXYCODONE HCL 5 MG PO TABS
20.0000 mg | ORAL_TABLET | ORAL | Status: DC | PRN
  Administered 2023-02-01 – 2023-02-03 (×2): 20 mg via ORAL
  Filled 2023-01-31 (×2): qty 4

## 2023-01-31 MED ORDER — ENOXAPARIN SODIUM 60 MG/0.6ML IJ SOSY
60.0000 mg | PREFILLED_SYRINGE | INTRAMUSCULAR | Status: DC
  Administered 2023-01-31 – 2023-02-03 (×4): 60 mg via SUBCUTANEOUS
  Filled 2023-01-31 (×4): qty 0.6

## 2023-01-31 MED ORDER — CARBIDOPA-LEVODOPA 25-100 MG PO TABS
1.0000 | ORAL_TABLET | Freq: Two times a day (BID) | ORAL | Status: DC
  Administered 2023-01-31 – 2023-02-04 (×8): 1 via ORAL
  Filled 2023-01-31 (×8): qty 1

## 2023-01-31 MED ORDER — VITAMIN D3 125 MCG (5000 UT) PO CAPS: 5000.0000 [IU] | ORAL_CAPSULE | Freq: Every day | ORAL | Status: DC

## 2023-01-31 MED ORDER — GUAIFENESIN ER 600 MG PO TB12
600.0000 mg | ORAL_TABLET | Freq: Two times a day (BID) | ORAL | Status: DC
  Administered 2023-01-31 – 2023-02-04 (×8): 600 mg via ORAL
  Filled 2023-01-31 (×8): qty 1

## 2023-01-31 MED ORDER — ATORVASTATIN CALCIUM 10 MG PO TABS
10.0000 mg | ORAL_TABLET | Freq: Every day | ORAL | Status: DC
  Administered 2023-02-01 – 2023-02-04 (×4): 10 mg via ORAL
  Filled 2023-01-31 (×4): qty 1

## 2023-01-31 MED ORDER — FUROSEMIDE 10 MG/ML IJ SOLN
40.0000 mg | Freq: Once | INTRAMUSCULAR | Status: AC
  Administered 2023-01-31: 40 mg via INTRAVENOUS
  Filled 2023-01-31: qty 4

## 2023-01-31 NOTE — Assessment & Plan Note (Signed)
-   The patient be admitted to a progressive unit bed. - We will place her on O2 protocol. - This is likely secondary to her acute on chronic diastolic CHF as well as COPD exacerbation. - Management otherwise as below.

## 2023-01-31 NOTE — Assessment & Plan Note (Signed)
-   We will continue her home CPAP nightly.

## 2023-01-31 NOTE — ED Triage Notes (Addendum)
Patient has been short of breath and congested in her chest for 2 weeks. Is coughing up gray mucus. Stated in the mornings her oxygen has been 70s room air. Has stage 4 lung cancer.

## 2023-01-31 NOTE — H&P (Addendum)
[place]   PATIENT NAME: [name]    MR#:  [id number]  DATE OF BIRTH:  1945-09-26  DATE OF ADMISSION:  01/31/2023  PRIMARY CARE PHYSICIAN: [name], MD   Patient is coming from: Home  REQUESTING/REFERRING PHYSICIAN: Arthor Captain, PA-C  CHIEF COMPLAINT:   Chief Complaint  Patient presents with   Shortness of Breath    HISTORY OF PRESENT ILLNESS:  [name] is a 77 y.o. Caucasian female with medical history significant for stage IV lung cancer, no longer taking treatment, asthma, anxiety, osteoarthritis, COPD, type 2 diabetes mellitus, OSA and Parkinson's disease, who presented to the emergency room with acute onset of worsening dyspnea with minimal exertion as well as cough and hypoxia to 85% on room air.  She started having cough about 3 weeks ago and that has been progressively worse and is currently productive of thick grayish green sputum.  The patient is on no home O2 and normal pulse oximetry is 93% for her.  She denied any chest pain or palpitations.  She has been having lower extremity edema and admits to wheezing with her dyspnea and cough.  She has been having 3 pillow orthopnea and denies any paroxysmal nocturnal dyspnea.  No fever or chills.  No nausea or vomiting or abdominal pain.  No dysuria, oliguria or hematuria or flank pain.  No bleeding diathesis.  No current paresthesias or focal muscle weakness.  ED Course: Upon presentation to the emergency room, vital signs were within normal except a pulse oximetry of 86 to 89% on room air.  Pulse oximetry was up to 94% on 2 L of O2 by nasal cannula.  Labs revealed CMP revealing a calcium of 8.7 and glucose of 145 with a creatinine 1.07 with albumin at 3.2 and otherwise unremarkable CMP.  BNP was 147.6.  CBC showed anemia with hemoglobin 10.5 and hematocrit 33.2.  Influenza A and B, RSV and COVID-19 PCR's came back negative. EKG as reviewed by me :   EKG showed normal sinus rhythm with a rate of 60  with short PR interval and right bundle branch block Imaging: EKG showed cardiomegaly with increased bronchial and interstitial thickening suspicious for pulmonary edema.  The patient was given 40 mg of IV Lasix, 2 g of IV magnesium sulfate, 20 mill equivalent.  Potassium chloride and 5 mg of albuterol nebulizer as well as 0.5 mg of Atrovent.  She will be admitted to a progressive unit bed for further evaluation and management. PAST MEDICAL HISTORY:   Past Medical History:  Diagnosis Date   Anxiety    Arthritis    generalized   Asthma    Bleeding nose    Cancer (HCC) 2017   Lung    COPD (chronic obstructive pulmonary disease) (HCC)    Diabetes mellitus    Diverticulitis    Dyspnea    Dysrhythmia    paraxysmol atrial fibrillation    H. pylori infection    Heart murmur    mild AS (03/16/20 echo)   Hypertension    Liver disease    Lung cancer (HCC)    stage 4 , with mets to bone.   Lung nodule 2017   Both lungs-pt at [facility].    Neuromuscular disorder (HCC)    bilateral feet   Parkinson disease (HCC) 2010   Sleep apnea    Tubular adenoma of colon 05/2007    PAST SURGICAL HISTORY:   Past Surgical History:  Procedure Laterality Date  APPENDECTOMY     BREAST SURGERY     implants at age [age] (removed 5 years later)   CHOLECYSTECTOMY     COLON SURGERY     colectomy and reversal after diverticular ds   PARTIAL COLECTOMY     PERICARDIAL WINDOW N/A 02/28/2017   Procedure: PERICARDIAL WINDOW;  Surgeon: Alleen Borne, MD;  Location: [facility] OR;  Service: Thoracic;  Laterality: N/A;   TONSILLECTOMY     TOTAL KNEE ARTHROPLASTY     right   VAGINAL HYSTERECTOMY      SOCIAL HISTORY:   Social History   Tobacco Use   Smoking status: Former    Current packs/day: 0.00    Average packs/day: 1 pack/day for 28.0 years (28.0 ttl pk-yrs)    Types: Cigarettes    Start date: 03/20/1959    Quit date: 03/20/1987    Years since quitting: 35.8   Smokeless tobacco: Never  Substance Use  Topics   Alcohol use: Yes    Comment: 2 margaritas a year    FAMILY HISTORY:   Family History  Problem Relation Age of Onset   Cancer - Lung Mother    Diabetes Mellitus II Sister    Cancer - Lung Brother    Diabetes Mellitus II Son    Colon cancer Neg Hx     DRUG ALLERGIES:   Allergies  Allergen Reactions   Ozempic (0.25 Or 0.5 Mg-Dose) [Semaglutide(0.25 Or 0.5mg -Dos)] Diarrhea   Morphine And Codeine Nausea And Vomiting and Other (See Comments)    Headache (also)   Tape Rash and Other (See Comments)    Can only tolerate for short amounts of time (CANNOT BE "LEFT" ON THE SAME SITE FOR LONG)    REVIEW OF SYSTEMS:   ROS As per history of present illness. All pertinent systems were reviewed above. Constitutional, HEENT, cardiovascular, respiratory, GI, GU, musculoskeletal, neuro, psychiatric, endocrine, integumentary and hematologic systems were reviewed and are otherwise negative/unremarkable except for positive findings mentioned above in the HPI.   MEDICATIONS AT HOME:   Prior to Admission medications   Medication Sig Start Date End Date Taking? Authorizing Provider  albuterol (PROVENTIL) (2.5 MG/3ML) 0.083% nebulizer solution Take 3 mLs (2.5 mg total) by nebulization every 6 (six) hours as needed for wheezing or shortness of breath. 03/17/20  Yes Charlott Holler, MD  albuterol (VENTOLIN HFA) 108 (90 Base) MCG/ACT inhaler Inhale 2 puffs into the lungs every 6 (six) hours as needed for wheezing or shortness of breath. 06/14/20  Yes Charlott Holler, MD  ALPRAZolam Prudy Feeler) 1 MG tablet Take 1 tablet (1 mg total) 2 (two) times daily as needed by mouth for anxiety. Patient taking differently: Take 1 mg by mouth See admin instructions. Take 1 mg by mouth in the morning & at bedtime- and an additional 1 mg once a day as needed for unresolved anxiety 03/05/17  Yes Gold, Glenice Laine, PA-C  aspirin 81 MG chewable tablet Chew 81-162 mg by mouth as needed (for chest pain).   Yes [provider]  atorvastatin (LIPITOR) 10 MG tablet Take 10 mg by mouth daily. 08/08/21  Yes [provider]  carbidopa-levodopa (SINEMET IR) 25-100 MG tablet Take 1 tablet by mouth in the morning and at bedtime. 07/22/21  Yes [provider]  Cholecalciferol (VITAMIN D3) 125 MCG (5000 UT) CAPS Take 5,000 Units by mouth daily.   Yes [provider]  diltiazem (CARDIZEM CD) 120 MG 24 hr capsule TAKE 1 CAPSULE(120 MG) BY MOUTH DAILY  Patient taking differently: Take 120 mg by mouth daily. 11/21/22  Yes Chandrasekhar, Mahesh A, MD  fentaNYL (DURAGESIC - DOSED MCG/HR) 100 MCG/HR Place 100 mcg onto the skin every other day. 02/21/17  Yes [provider]  fentaNYL (DURAGESIC) 25 MCG/HR Place 1 patch onto the skin every other day. 07/26/20  Yes [provider]  fluticasone (FLONASE) 50 MCG/ACT nasal spray Place 1-2 sprays into both nostrils daily as needed for allergies or rhinitis. 07/01/21  Yes [provider]  furosemide (LASIX) 40 MG tablet Take 1 tablet (40 mg total) by mouth daily. 01/29/23  Yes Chandrasekhar, Mahesh A, MD  HUMALOG KWIKPEN 100 UNIT/ML KiwkPen Inject 0.12 mLs (12 Units total) into the skin 3 (three) times daily before meals. Along with your normal sliding scale. Patient taking differently: Inject 3-4 Units into the skin daily as needed (for a BGL greater than 300). 06/20/17  Yes Arnetha Courser, MD  hydrocortisone (CORTEF) 5 MG tablet Take 10-15 mg by mouth See admin instructions. Take 15 mg by mouth in the morning and 10 mg at bedtime 03/15/19  Yes [provider]  insulin glargine (LANTUS) 100 UNIT/ML injection Inject 40-60 Units into the skin See admin instructions. Inject 60 units into the skin in the morning and 40 units at bedtime   Yes [provider]  JANUMET 50-500 MG tablet Take 1 tablet by mouth 2 (two) times daily with a meal.   Yes [provider]  nitroGLYCERIN (NITROSTAT) 0.4 MG SL tablet PLACE AND  DISSOLVE ONE TABLET UNDER TONGUE AS NEEDED FOR CHEST PAIN( EVERY 5 MINUTES, MAY TAKE UP TO 3 TIMES DAILY) Patient taking differently: Place 0.4 mg under the tongue every 5 (five) minutes x 3 doses as needed for chest pain. 11/01/21  Yes Chandrasekhar, Mahesh A, MD  omeprazole (PRILOSEC) 20 MG capsule Take 20 mg by mouth daily as needed (acid reflux). 05/04/19  Yes [provider]  ondansetron (ZOFRAN) 8 MG tablet Take 8 mg by mouth every 8 (eight) hours as needed for nausea or vomiting.   Yes [provider]  Oxycodone HCl 20 MG TABS Take 20 mg by mouth every 4 (four) hours as needed (for pain).   Yes [provider]  pregabalin (LYRICA) 50 MG capsule Take 50 mg by mouth in the morning. 02/05/20  Yes [provider]  pregabalin (LYRICA) 75 MG capsule Take 75 mg by mouth at bedtime. 02/05/20  Yes [provider]  PRESCRIPTION MEDICATION CPAP- At bedtime   Yes [provider]  promethazine (PHENERGAN) 12.5 MG tablet Take 12.5 mg by mouth every 6 (six) hours as needed for nausea or vomiting. 09/19/18  Yes [provider]  sertraline (ZOLOFT) 100 MG tablet Take 100 mg by mouth daily. 06/29/13  Yes [provider]  TRELEGY ELLIPTA 100-62.5-25 MCG/INH AEPB Take 1 puff by mouth daily. Patient taking differently: Inhale 1 puff into the lungs daily. 07/09/20  Yes Charlott Holler, MD  metoprolol tartrate (LOPRESSOR) 100 MG tablet Take 2 hours prior to Cardiac CT Patient not taking: Reported on 01/31/2023 09/06/21   Christell Constant, MD      VITAL SIGNS:  Blood pressure (!) 127/58, pulse 77, temperature 97.8 F (36.6 C), temperature source Oral, resp. rate 17, height 5\' 3"  (1.6 m), weight 117.9 kg, SpO2 94%.  PHYSICAL EXAMINATION:  Physical Exam  GENERAL:  77 y.o.-year-old Caucasian female patient lying in the bed with no acute distress.  EYES: Pupils equal, round, reactive to light and  accommodation. No scleral icterus. Extraocular  muscles intact.  HEENT: Head atraumatic, normocephalic. Oropharynx and nasopharynx clear.  NECK:  Supple, no jugular venous distention. No thyroid enlargement, no tenderness.  LUNGS: Diminished bibasilar breath sounds with bibasal rales and diffuse expiratory wheezes with tight expiratory airflow and harsh vesicular breathing.  No use of accessory muscles of respiration.  CARDIOVASCULAR: Regular rate and rhythm, S1, S2 normal. No murmurs, rubs, or gallops.  ABDOMEN: Soft, nondistended, nontender. Bowel sounds present. No organomegaly or mass.  EXTREMITIES: Trace bilateral lower extremity pitting edema, with no cyanosis, or clubbing.  NEUROLOGIC: Cranial nerves II through XII are intact. Muscle strength 5/5 in all extremities. Sensation intact. Gait not checked.  PSYCHIATRIC: The patient is alert and oriented x 3.  Normal affect and good eye contact. SKIN: No obvious rash, lesion, or ulcer.   LABORATORY PANEL:   CBC Recent Labs  Lab 01/31/23 1358  WBC 9.1  HGB 10.5*  HCT 33.2*  PLT 210   ------------------------------------------------------------------------------------------------------------------  Chemistries  Recent Labs  Lab 01/31/23 1358  NA 138  K 4.1  CL 104  CO2 24  GLUCOSE 145*  BUN 21  CREATININE 1.07*  CALCIUM 8.7*  AST 9*  ALT 11  ALKPHOS 75  BILITOT 0.5   ------------------------------------------------------------------------------------------------------------------  Cardiac Enzymes No results for input(s): "TROPONINI" in the last 168 hours. ------------------------------------------------------------------------------------------------------------------  RADIOLOGY:  DG Chest Port 1 View  Result Date: 01/31/2023 CLINICAL DATA:  Shortness of breath. EXAM: PORTABLE CHEST 1 VIEW COMPARISON:  04/13/2022 FINDINGS: The heart is enlarged. Mediastinal contours are stable. Increase in bronchial and interstitial thickening suspicious for pulmonary edema. Trace  fluid in the right minor fissure. No large subpulmonic effusion. No focal airspace disease. No pneumothorax. IMPRESSION: Cardiomegaly with increased bronchial and interstitial thickening suspicious for pulmonary edema. Electronically Signed   By: Narda Rutherford M.D.   On: 01/31/2023 15:46      IMPRESSION AND PLAN:  Assessment and Plan: * Acute respiratory failure with hypoxia (HCC) - The patient be admitted to a progressive unit bed. - We will place her on O2 protocol. - This is likely secondary to her acute on chronic diastolic CHF as well as COPD exacerbation. - Management otherwise as below.  Acute on chronic diastolic CHF (congestive heart failure) (HCC) - Last 2D echo about a year ago showed grade 2 diastolic dysfunction with EF of 60 to 65%. - She be diuresed with IV Lasix. - Will with follow serial troponins. - This is likely the main culprit for her hypoxia.  COPD with acute exacerbation (HCC) - We will place her on scheduled and as needed DuoNebs. - We will place her on steroid therapy with IV Solu-Medrol. - Mucolytic therapy will be provided.  Type 2 diabetes mellitus with peripheral neuropathy (HCC) - The patient be placed on supplemental coverage with NovoLog. - We will continue her basal coverage. - We will hold off Janumet. - We will continue Lyrica.  Anxiety and depression - We will continue Xanax and Zoloft  Essential hypertension - We will continue her antihypertensive therapy.  Parkinson's disease (HCC) - We will can continue Sinemet IR.  Sleep apnea - We will continue her home CPAP nightly.    DVT prophylaxis: Lovenox.  Advanced Care Planning:  Code Status: full code.  Family Communication:  The plan of care was discussed in details with the patient (and family). I answered all questions. The patient agreed to proceed with the above mentioned plan. Further management will depend upon hospital course. Disposition  Plan: Back to previous home  environment Consults called: none.  All the records are reviewed and case discussed with ED provider.  Status is: Inpatient  At the time of the admission, it appears that the appropriate admission status for this patient is inpatient.  This is judged to be reasonable and necessary in order to provide the required intensity of service to ensure the patient's safety given the presenting symptoms, physical exam findings and initial radiographic and laboratory data in the context of comorbid conditions.  The patient requires inpatient status due to high intensity of service, high risk of further deterioration and high frequency of surveillance required.  I certify that at the time of admission, it is my clinical judgment that the patient will require inpatient hospital care extending more than 2 midnights.                            Dispo: The patient is from: Home              Anticipated d/c is to: Home              Patient currently is not medically stable to d/c.              Difficult to place patient: No  Hannah Beat M.D on 01/31/2023 at 7:21 PM  [facility]   From 7 PM-7 AM, contact night-coverage [email or web address]  CC: Primary care physician; [name], MD

## 2023-01-31 NOTE — Assessment & Plan Note (Signed)
-   We will can continue Sinemet IR.

## 2023-01-31 NOTE — Progress Notes (Signed)
   01/31/23 2245  BiPAP/CPAP/SIPAP  $ Non-Invasive Home Ventilator  Initial  BiPAP/CPAP/SIPAP Pt Type Adult  BiPAP/CPAP/SIPAP DREAMSTATIOND  Mask Type Nasal mask (per home regimen)  Mask Size Medium (m/l)  Respiratory Rate 18 breaths/min  EPAP 13 cmH2O (home settings per chart/notes)  Flow Rate 2 lpm  Patient Home Equipment No  Auto Titrate No  CPAP/SIPAP surface wiped down Yes  BiPAP/CPAP /SiPAP Vitals  Pulse Rate 62  Resp 18  SpO2 96 %

## 2023-01-31 NOTE — Assessment & Plan Note (Signed)
-   Last 2D echo about a year ago showed grade 2 diastolic dysfunction with EF of 60 to 65%. - She be diuresed with IV Lasix. - Will with follow serial troponins. - This is likely the main culprit for her hypoxia.

## 2023-01-31 NOTE — Assessment & Plan Note (Signed)
-   We will place her on scheduled and as needed DuoNebs. - We will place her on steroid therapy with IV Solu-Medrol. - Mucolytic therapy will be provided.

## 2023-01-31 NOTE — Assessment & Plan Note (Signed)
-  We will continue her antihypertensive therapy 

## 2023-01-31 NOTE — Assessment & Plan Note (Addendum)
-   The patient be placed on supplemental coverage with NovoLog. - We will continue her basal coverage. - We will hold off Janumet. - We will continue Lyrica.

## 2023-01-31 NOTE — Assessment & Plan Note (Signed)
-   We will continue Xanax and Zoloft.

## 2023-01-31 NOTE — ED Provider Notes (Signed)
[place] EMERGENCY DEPARTMENT AT [facility] Provider Note   CSN: [id number] Arrival date & time: 01/31/23  1229     History  Chief Complaint  Patient presents with   Shortness of Breath    [name] is a 77 y.o. female.  With a past medical history of stage IV lung cancer no longer taking treatment, COPD, asthma, diabetes, paroxysmal A-fib not on anticoagulation who presents emergency department for worsening shortness of breath.  She is here with her son.  Patient reports that she began having a cough about 3 weeks ago which has progressively worsened.  She has been having increased shortness of breath especially with exertion.  She is now having production of phlegm with her cough.  Patient does not have oxygen at home and noted that this morning her oxygen saturations were 85% on room air and she is having significant difficulty breathing.  She is also complaining of significant numbness and tingling in her right arm.  She has swelling but it is "all over my body."  She denies fever or chills.   Shortness of Breath      Home Medications Prior to Admission medications   Medication Sig Start Date End Date Taking? Authorizing Provider  albuterol (PROVENTIL) (2.5 MG/3ML) 0.083% nebulizer solution Take 3 mLs (2.5 mg total) by nebulization every 6 (six) hours as needed for wheezing or shortness of breath. 03/17/20   Charlott Holler, MD  albuterol (VENTOLIN HFA) 108 (90 Base) MCG/ACT inhaler Inhale 2 puffs into the lungs every 6 (six) hours as needed for wheezing or shortness of breath. 06/14/20   Charlott Holler, MD  Alpha-Lipoic Acid 600 MG CAPS Take by mouth daily. 02/25/19   [provider]  ALPRAZolam Prudy Feeler) 1 MG tablet Take 1 tablet (1 mg total) 2 (two) times daily as needed by mouth for anxiety. 03/05/17   Gold, Wayne E, PA-C  atorvastatin (LIPITOR) 10 MG tablet Take 10 mg by mouth daily. 08/08/21   [provider]  carbidopa-levodopa (SINEMET  IR) 25-100 MG tablet Take 1 tablet by mouth 2 (two) times daily. 07/22/21   [provider]  Cholecalciferol (VITAMIN D3) 125 MCG (5000 UT) CAPS Take 5,000 Units by mouth daily.    [provider]  diltiazem (CARDIZEM CD) 120 MG 24 hr capsule TAKE 1 CAPSULE(120 MG) BY MOUTH DAILY 11/21/22   Chandrasekhar, Mahesh A, MD  fentaNYL (DURAGESIC - DOSED MCG/HR) 100 MCG/HR Place 100 mcg onto the skin every other day. 02/21/17   [provider]  fentaNYL (DURAGESIC) 25 MCG/HR Place 1 patch onto the skin every other day. 07/26/20   [provider]  fluticasone (FLONASE) 50 MCG/ACT nasal spray as needed. 07/01/21   [provider]  furosemide (LASIX) 40 MG tablet Take 1 tablet (40 mg total) by mouth daily. 01/29/23   Chandrasekhar, Rondel Jumbo, MD  HUMALOG KWIKPEN 100 UNIT/ML KiwkPen Inject 0.12 mLs (12 Units total) into the skin 3 (three) times daily before meals. Along with your normal sliding scale. Patient taking differently: Inject 10-20 Units into the skin 3 (three) times daily before meals. Along with your normal sliding scale. 06/20/17   Arnetha Courser, MD  hydrocortisone (CORTEF) 5 MG tablet Take 10-15 mg by mouth See admin instructions. Take 3 tablets (15 mg) in the morning and 2 tablets (10 mg) every night. 03/15/19   [provider]  insulin glargine (LANTUS) 100 UNIT/ML injection Inject 40 Units into the skin 2 (two) times daily.  [provider]  metoprolol tartrate (LOPRESSOR) 100 MG tablet Take 2 hours prior to Cardiac CT 09/06/21   Chandrasekhar, [place] Dragon A, MD  nitroGLYCERIN (NITROSTAT) 0.4 MG SL tablet PLACE AND DISSOLVE ONE TABLET UNDER TONGUE AS NEEDED FOR CHEST PAIN( EVERY 5 MINUTES, MAY TAKE UP TO 3 TIMES DAILY) 11/01/21   Chandrasekhar, Mahesh A, MD  omeprazole (PRILOSEC) 20 MG capsule Take 20 mg by mouth daily as needed (acid reflux). 05/04/19   [provider]  Oxycodone HCl 10 MG TABS Take 10 mg by mouth every 2 (two) hours as  needed for pain. 02/21/17   [provider]  pregabalin (LYRICA) 50 MG capsule Take 50 mg by mouth 2 (two) times daily. 02/05/20   [provider]  pregabalin (LYRICA) 75 MG capsule Take 75 mg by mouth at bedtime. 02/05/20   [provider]  promethazine (PHENERGAN) 12.5 MG tablet Take 12.5 mg by mouth every 6 (six) hours as needed for nausea or vomiting. 09/19/18   [provider]  sertraline (ZOLOFT) 100 MG tablet Take 100 mg by mouth daily. 06/29/13   [provider]  TRELEGY ELLIPTA 100-62.5-25 MCG/INH AEPB Take 1 puff by mouth daily. 07/09/20   Charlott Holler, MD      Allergies    Morphine and codeine and Tape    Review of Systems   Review of Systems  Respiratory:  Positive for shortness of breath.     Physical Exam Updated Vital Signs BP 133/70 (BP Location: Left Arm)   Pulse 68   Temp 98.8 F (37.1 C) (Oral)   Resp 17   Ht 5\' 3"  (1.6 m)   Wt 117.9 kg   SpO2 93%   BMI 46.06 kg/m  Physical Exam Vitals and nursing note reviewed.  Constitutional:      General: She is not in acute distress.    Appearance: She is well-developed. She is not diaphoretic.  HENT:     Head: Normocephalic and atraumatic.     Right Ear: External ear normal.     Left Ear: External ear normal.     Nose: Nose normal.     Mouth/Throat:     Mouth: Mucous membranes are moist.  Eyes:     General: No scleral icterus.    Conjunctiva/sclera: Conjunctivae normal.  Cardiovascular:     Rate and Rhythm: Normal rate and regular rhythm.     Heart sounds: Normal heart sounds. No murmur heard.    No friction rub. No gallop.  Pulmonary:     Effort: Pulmonary effort is normal. No respiratory distress.     Breath sounds: Wheezing and rhonchi present.  Abdominal:     General: Bowel sounds are normal. There is no distension.     Palpations: Abdomen is soft. There is no mass.     Tenderness: There is no abdominal tenderness. There is no guarding.  Musculoskeletal:      Cervical back: Normal range of motion.     Comments: No pitting edema noted in the upper extremities.  No significant swelling of the face or neck.  Equal grip strength bilaterally  Skin:    General: Skin is warm and dry.  Neurological:     Mental Status: She is alert and oriented to person, place, and time.  Psychiatric:        Behavior: Behavior normal.     ED Results / Procedures / Treatments   Labs (all labs ordered are listed, but only abnormal results are displayed) Labs  Reviewed  RESP PANEL BY RT-PCR (RSV, FLU A&B, COVID)  [id number]  COMPREHENSIVE METABOLIC PANEL  CBC    EKG None  Radiology No results found.  Procedures .Critical Care  Performed by: Arthor Captain, PA-C Authorized by: Arthor Captain, PA-C   Critical care provider statement:    Critical care time (minutes):  75   Critical care time was exclusive of:  Separately billable procedures and treating other patients   Critical care was necessary to treat or prevent imminent or life-threatening deterioration of the following conditions:  Respiratory failure   Critical care was time spent personally by me on the following activities:  Development of treatment plan with patient or surrogate, discussions with consultants, evaluation of patient's response to treatment, examination of patient, ordering and review of laboratory studies, ordering and review of radiographic studies, ordering and performing treatments and interventions, pulse oximetry, re-evaluation of patient's condition and review of old charts     Medications Ordered in ED Medications  ipratropium (ATROVENT) nebulizer solution 0.5 mg (has no administration in time range)  albuterol (PROVENTIL) (2.5 MG/3ML) 0.083% nebulizer solution 5 mg (has no administration in time range)  magnesium sulfate IVPB 2 g 50 mL (has no administration in time range)    ED Course/ Medical Decision Making/ A&P Clinical Course as of 02/01/23 0903  Wed Jan 31, 2023   1457 WBC: 9.1 [AH]    Clinical Course User Index [AH] Arthor Captain, PA-C                                 Medical Decision Making This patient presents to the ED for concern of sob/ hypoxia, this involves an extensive number of treatment options, and is a complaint that carries with it a high risk of complications and morbidity.  The emergent differential diagnosis for shortness of breath includes, but is not limited to, Pulmonary edema, bronchoconstriction, Pneumonia, Pulmonary embolism, Pneumotherax/ Hemothorax, Dysrythmia, ACS.     Co morbidities:       has a past medical history of Anxiety, Arthritis, Asthma, Bleeding nose, Cancer (HCC) (2017), COPD (chronic obstructive pulmonary disease) (HCC), Diabetes mellitus, Diverticulitis, Dyspnea, Dysrhythmia, H. pylori infection, Heart murmur, Hypertension, Liver disease, Lung cancer (HCC), Lung nodule (2017), Neuromuscular disorder (HCC), Parkinson disease (HCC) (2010), Sleep apnea, and Tubular adenoma of colon (05/2007). Diastolic dysfunction with EF 60-65%  Social Determinants of Health:       SDOH Screenings Tobacco Use: Medium Risk (01/31/2023)   Additional history:  {Additional history obtained from son and patient at bedside   Lab Tests:  I Ordered, and personally interpreted labs.  The pertinent results include:   WBC WNL HGB 10.5 <12.6- no reported melena Resp panel negative BNP- 147.6  in setting of obesity    Imaging Studies:  I ordered imaging studies including cxr I independently visualized and interpreted imaging which showed ? Pulmonary edema I agree with the radiologist interpretation  Cardiac Monitoring/ECG:       The patient was maintained on a cardiac monitor.  I personally viewed and interpreted the cardiac monitored which showed an underlying rhythm of:  nsr  Medicines ordered and prescription drug management:  I ordered medication including Medications ipratropium (ATROVENT) nebulizer  solution 0.5 mg (0.5 mg Nebulization Given 01/31/23 1422) albuterol (PROVENTIL) (2.5 MG/3ML) 0.083% nebulizer solution 5 mg (5 mg Nebulization Given 01/31/23 1422) magnesium sulfate IVPB 2 g 50 mL (0 g Intravenous Stopped 01/31/23 1554) furosemide (LASIX)  injection 40 mg (40 mg Intravenous Given 01/31/23 1625) potassium chloride (KLOR-CON) packet 20 mEq (20 mEq Oral Given 01/31/23 1625) for wheezing, suspected pulmonary edema Reevaluation of the patient after these medicines showed that the patient improved I have reviewed the patients home medicines and have made adjustments as needed  Test Considered:       CT chest for assessment of masses, low suspicion for PE based on PE/ Hx  Critical Interventions:         Consultations Obtained: [facility] for admission- Dr. Arville Care  Problem List / ED Course:       (J96.01) Acute respiratory failure with hypoxia (HCC)  (primary encounter diagnosis) Comment: mixed chf/ copd   MDM: patient with acute on chronic hypoxic resp failure in - likely mixed etiology    Dispostion:  After consideration of the diagnostic results and the patients response to treatment, I feel that the patent would benefit from admission.    Amount and/or Complexity of Data Reviewed Labs: ordered. Decision-making details documented in ED Course. Radiology: ordered.  Risk Prescription drug management. Decision regarding hospitalization.           Final Clinical Impression(s) / ED Diagnoses Final diagnoses:  None    Rx / DC Orders ED Discharge Orders     None         Arthor Captain, PA-C 02/01/23 0919    Bethann Berkshire, MD 02/03/23 1214

## 2023-02-01 DIAGNOSIS — I5033 Acute on chronic diastolic (congestive) heart failure: Secondary | ICD-10-CM | POA: Diagnosis not present

## 2023-02-01 DIAGNOSIS — J441 Chronic obstructive pulmonary disease with (acute) exacerbation: Secondary | ICD-10-CM

## 2023-02-01 DIAGNOSIS — E1142 Type 2 diabetes mellitus with diabetic polyneuropathy: Secondary | ICD-10-CM

## 2023-02-01 DIAGNOSIS — G20A1 Parkinson's disease without dyskinesia, without mention of fluctuations: Secondary | ICD-10-CM

## 2023-02-01 DIAGNOSIS — J9601 Acute respiratory failure with hypoxia: Secondary | ICD-10-CM

## 2023-02-01 LAB — HEMOGLOBIN A1C
Hgb A1c MFr Bld: 10.8 % — ABNORMAL HIGH (ref 4.8–5.6)
Mean Plasma Glucose: 263.26 mg/dL

## 2023-02-01 LAB — URINALYSIS, ROUTINE W REFLEX MICROSCOPIC
Bacteria, UA: NONE SEEN
Bilirubin Urine: NEGATIVE
Glucose, UA: >=500 mg/dL — AB
Hgb urine dipstick: NEGATIVE
Ketones, ur: NEGATIVE mg/dL
Leukocytes,Ua: NEGATIVE
Nitrite: NEGATIVE
Protein, ur: NEGATIVE mg/dL
Specific Gravity, Urine: 1.014 (ref 1.005–1.030)
pH: 5 (ref 5.0–8.0)

## 2023-02-01 LAB — BLOOD GAS, VENOUS
Acid-Base Excess: 0.5 mmol/L (ref 0.0–2.0)
Bicarbonate: 27 mmol/L (ref 20.0–28.0)
O2 Saturation: 67 %
Patient temperature: 36.7
pCO2, Ven: 49 mm[Hg] (ref 44–60)
pH, Ven: 7.34 (ref 7.25–7.43)
pO2, Ven: 37 mm[Hg] (ref 32–45)

## 2023-02-01 LAB — BASIC METABOLIC PANEL
Anion gap: 13 (ref 5–15)
Anion gap: 11 (ref 5–15)
BUN: 30 mg/dL — ABNORMAL HIGH (ref 8–23)
BUN: 39 mg/dL — ABNORMAL HIGH (ref 8–23)
CO2: 23 mmol/L (ref 22–32)
CO2: 24 mmol/L (ref 22–32)
Calcium: 8.4 mg/dL — ABNORMAL LOW (ref 8.9–10.3)
Calcium: 8.4 mg/dL — ABNORMAL LOW (ref 8.9–10.3)
Chloride: 97 mmol/L — ABNORMAL LOW (ref 98–111)
Chloride: 93 mmol/L — ABNORMAL LOW (ref 98–111)
Creatinine, Ser: 1.31 mg/dL — ABNORMAL HIGH (ref 0.44–1.00)
Creatinine, Ser: 1.39 mg/dL — ABNORMAL HIGH (ref 0.44–1.00)
GFR, Estimated: 42 mL/min — ABNORMAL LOW (ref >60)
GFR, Estimated: 40 mL/min — ABNORMAL LOW (ref >60)
Glucose, Bld: 556 mg/dL (ref 70–99)
Glucose, Bld: 709 mg/dL (ref 70–99)
Potassium: 5.1 mmol/L (ref 3.5–5.1)
Potassium: 4.6 mmol/L (ref 3.5–5.1)
Sodium: 133 mmol/L — ABNORMAL LOW (ref 135–145)
Sodium: 128 mmol/L — ABNORMAL LOW (ref 135–145)

## 2023-02-01 LAB — CBC
HCT: 34.8 % — ABNORMAL LOW (ref 36.0–46.0)
Hemoglobin: 10.8 g/dL — ABNORMAL LOW (ref 12.0–15.0)
MCH: 27.1 pg (ref 26.0–34.0)
MCHC: 31 g/dL (ref 30.0–36.0)
MCV: 87.2 fL (ref 80.0–100.0)
Platelets: 208 10*3/uL (ref 150–400)
RBC: 3.99 MIL/uL (ref 3.87–5.11)
RDW: 14.5 % (ref 11.5–15.5)
WBC: 8.4 10*3/uL (ref 4.0–10.5)
nRBC: 0 % (ref 0.0–0.2)

## 2023-02-01 LAB — GLUCOSE, CAPILLARY
Glucose-Capillary: 532 mg/dL (ref 70–99)
Glucose-Capillary: >600 mg/dL (ref 70–99)
Glucose-Capillary: 487 mg/dL — ABNORMAL HIGH (ref 70–99)

## 2023-02-01 LAB — BETA-HYDROXYBUTYRIC ACID: Beta-Hydroxybutyric Acid: 0.53 mmol/L — ABNORMAL HIGH (ref 0.05–0.27)

## 2023-02-01 LAB — GLUCOSE, RANDOM: Glucose, Bld: 551 mg/dL (ref 70–99)

## 2023-02-01 MED ORDER — SERTRALINE HCL 100 MG PO TABS
100.0000 mg | ORAL_TABLET | Freq: Every day | ORAL | Status: DC
  Administered 2023-02-01 – 2023-02-03 (×3): 100 mg via ORAL
  Filled 2023-02-01 (×3): qty 1

## 2023-02-01 MED ORDER — INSULIN ASPART 100 UNIT/ML IJ SOLN
15.0000 [IU] | Freq: Once | INTRAMUSCULAR | Status: AC
  Administered 2023-02-01: 15 [IU] via SUBCUTANEOUS

## 2023-02-01 MED ORDER — INSULIN ASPART 100 UNIT/ML IJ SOLN
3.0000 [IU] | Freq: Three times a day (TID) | INTRAMUSCULAR | Status: DC
  Administered 2023-02-01 (×2): 3 [IU] via SUBCUTANEOUS
  Filled 2023-02-01: qty 0.03

## 2023-02-01 MED ORDER — ARFORMOTEROL TARTRATE 15 MCG/2ML IN NEBU
15.0000 ug | INHALATION_SOLUTION | Freq: Two times a day (BID) | RESPIRATORY_TRACT | Status: DC
  Administered 2023-02-01 – 2023-02-04 (×7): 15 ug via RESPIRATORY_TRACT
  Filled 2023-02-01 (×7): qty 2

## 2023-02-01 MED ORDER — INSULIN ASPART 100 UNIT/ML IV SOLN
10.0000 [IU] | Freq: Once | INTRAVENOUS | Status: AC
  Administered 2023-02-01: 10 [IU] via INTRAVENOUS

## 2023-02-01 MED ORDER — BUDESONIDE 0.25 MG/2ML IN SUSP
0.2500 mg | Freq: Two times a day (BID) | RESPIRATORY_TRACT | Status: DC
  Administered 2023-02-01 – 2023-02-04 (×7): 0.25 mg via RESPIRATORY_TRACT
  Filled 2023-02-01 (×7): qty 2

## 2023-02-01 MED ORDER — ALBUTEROL SULFATE (2.5 MG/3ML) 0.083% IN NEBU
2.5000 mg | INHALATION_SOLUTION | RESPIRATORY_TRACT | Status: DC | PRN
  Administered 2023-02-02: 2.5 mg via RESPIRATORY_TRACT
  Filled 2023-02-01: qty 3

## 2023-02-01 MED ORDER — IPRATROPIUM-ALBUTEROL 0.5-2.5 (3) MG/3ML IN SOLN
3.0000 mL | Freq: Two times a day (BID) | RESPIRATORY_TRACT | Status: DC
  Administered 2023-02-01 – 2023-02-04 (×6): 3 mL via RESPIRATORY_TRACT
  Filled 2023-02-01 (×6): qty 3

## 2023-02-01 MED ORDER — INSULIN ASPART 100 UNIT/ML IJ SOLN
0.0000 [IU] | INTRAMUSCULAR | Status: DC
  Administered 2023-02-02 (×2): 20 [IU] via SUBCUTANEOUS

## 2023-02-01 MED ORDER — INFLUENZA VAC A&B SURF ANT ADJ 0.5 ML IM SUSY
0.5000 mL | PREFILLED_SYRINGE | INTRAMUSCULAR | Status: AC
  Administered 2023-02-02: 0.5 mL via INTRAMUSCULAR
  Filled 2023-02-01: qty 0.5

## 2023-02-01 MED ORDER — INSULIN ASPART 100 UNIT/ML IJ SOLN
0.0000 [IU] | Freq: Three times a day (TID) | INTRAMUSCULAR | Status: DC
  Filled 2023-02-01: qty 0.15

## 2023-02-01 MED ORDER — INSULIN ASPART 100 UNIT/ML IJ SOLN
0.0000 [IU] | Freq: Every day | INTRAMUSCULAR | Status: DC
  Filled 2023-02-01: qty 0.05

## 2023-02-01 NOTE — Progress Notes (Signed)
   02/01/23 2348  BiPAP/CPAP/SIPAP  BiPAP/CPAP/SIPAP Pt Type Adult  BiPAP/CPAP/SIPAP DREAMSTATIOND  Mask Type Nasal mask  Mask Size Medium  Respiratory Rate 18 breaths/min  EPAP 13 cmH2O  Flow Rate 2 lpm  Patient Home Equipment No  Auto Titrate No

## 2023-02-01 NOTE — ED Notes (Signed)
Patient was assisted to the restroom. Patient wanted help changing into a gown and out of her clothes. Got patient back to room and assisted with placing gown on. Got the patient hooked back up and on the Nasal Cannula at this time at 3L. Patient wanted break from the CPAP machine. Patient denied any needs at this time.

## 2023-02-01 NOTE — Progress Notes (Signed)
[facility]                                                                              Tynslee Nowak, is a 77 y.o. female, DOB - 1946-01-24, QIO:[id number] Admit date - 01/31/2023    Outpatient Primary MD for the patient is [name], MD  LOS - 1  days  Chief Complaint  Patient presents with   Shortness of Breath       Brief summary   Patient is a 77 year old female with stage IV lung cancer, no longer on treatment, asthma, COPD, diabetes mellitus type 2, OSA, Parkinson's disease presented to ED with acute worsening of shortness of breath with minimal exertion, cough and hypoxia.  Patient reported having cough about 3 weeks ago and progressively worsened, currently productive of thick green to greenish sputum.  Not on home O2.  Found to be hypoxic with O2 sats 85% on room air.  She also reported lower extremity edema, wheezing with dyspnea and cough, 3 pillow orthopnea.  No fevers. In ED, noted to be hypoxic to 86-89% on room air, placed on 2 L, O2 sats 94%.  Creatinine 1.07, BNP 147.6  COVID PCR negative.  Chest x-ray showed cardiomegaly with increased bronchial and interstitial thickening suspicious for pulmonary edema  Assessment & Plan    Principal Problem:   Acute respiratory failure with hypoxia (HCC) secondary to COPD exacerbation, acute on chronic diastolic CHF -Continue current management, IV Lasix, bronchodilators -Wean O2 as tolerated  Active Problems:   Acute on chronic diastolic CHF (congestive heart failure) (HCC) -Presented with hypoxia, orthopnea, chest x-ray with pulmonary edema, elevated BNP -Continue IV Lasix for diuresis, 40 mg every 12 hours -Follow 2D echo, strict I's and O's and daily weights     COPD with acute exacerbation (HCC) -Currently diffusely wheezing, at home on trelegy -Noted to be on Cortef and high-dose IV Solu-Medrol 40 mg every 8 hours, with marked hyperglycemia -Discontinued Solu-Medrol, continue scheduled  DuoNebs, added Pulmicort, Brovana, flutter valve -Continue IV Rocephin    Type 2 diabetes mellitus with peripheral neuropathy (HCC), uncontrolled with hyperglycemia CBG (last 3)  Recent Labs    01/31/23 2120  GLUCAP 347*   -Hold IV Solu-Medrol, obtain hemoglobin A1c, -Placed on sliding scale insulin, continue Semglee 40 units nightly, 60 units a.m. Diabetic coordinator consult    Sleep apnea -Continue CPAP    Parkinson's disease (HCC) -Continue Sinemet    Essential hypertension -BP stable, continue Cardizem, Lasix    Anxiety and depression Continue Zoloft   Morbid obesity Estimated body mass index is 46.06 kg/m as calculated from the following:   Height as of this encounter: 5\' 3"  (1.6 m).   Weight as of this encounter: 117.9 kg.  Code Status: Full code DVT Prophylaxis:  Lovenox  Level of Care: Level of care: Progressive Family Communication: Updated patient Disposition Plan:      Remains inpatient appropriate: Diffusely wheezing   Procedures:  None  Consultants:   None  Antimicrobials:   Anti-infectives (From admission, onward)    Start     Dose/Rate Route Frequency Ordered Stop   01/31/23 2100  cefTRIAXone (ROCEPHIN)  1 g in sodium chloride 0.9 % 100 mL IVPB        1 g 200 mL/hr over 30 Minutes Intravenous Every 24 hours 01/31/23 2011            Medications  ALPRAZolam  1 mg Oral BID   arformoterol  15 mcg Nebulization BID   atorvastatin  10 mg Oral Daily   budesonide (PULMICORT) nebulizer solution  0.25 mg Nebulization BID   carbidopa-levodopa  1 tablet Oral BID   cholecalciferol  5,000 Units Oral Daily   diltiazem  120 mg Oral Daily   enoxaparin (LOVENOX) injection  60 mg Subcutaneous Q24H   fentaNYL  1 patch Transdermal QODAY   And   fentaNYL  1 patch Transdermal Q72H   furosemide  40 mg Intravenous BID   guaiFENesin  600 mg Oral BID   hydrocortisone  10 mg Oral QHS   hydrocortisone  15 mg Oral Daily   insulin aspart  0-15 Units  Subcutaneous TID WC   insulin aspart  0-5 Units Subcutaneous QHS   insulin aspart  3 Units Subcutaneous TID WC   insulin glargine-yfgn  40 Units Subcutaneous QHS   insulin glargine-yfgn  60 Units Subcutaneous Daily   ipratropium-albuterol  3 mL Nebulization BID   pantoprazole  40 mg Oral Daily   pregabalin  75 mg Oral QHS   sertraline  100 mg Oral QHS      Subjective:   Nilza Nauman was seen and examined today.  Diffusely wheezing, still short of breath.  O2 sats improving.  Patient denies dizziness, chest pain, abdominal pain, N/V/D/C, new weakness, numbess, tingling.   Objective:   Vitals:   02/01/23 0806 02/01/23 0900 02/01/23 1000 02/01/23 1130  BP:  139/71  (!) 140/60  Pulse:  76  68  Resp:  14  16  Temp:   97.7 F (36.5 C)   TempSrc:   Oral   SpO2: (!) 88% 90%  96%  Weight:      Height:        Intake/Output Summary (Last 24 hours) at 02/01/2023 1319 Last data filed at 01/31/2023 1554 Gross per 24 hour  Intake 50 ml  Output --  Net 50 ml     Wt Readings from Last 3 Encounters:  01/31/23 117.9 kg  07/20/22 118.6 kg  05/01/22 118.1 kg     Exam General: Alert and oriented x 3, NAD Cardiovascular: S1 S2 auscultated,  RRR Respiratory: Diffuse expiratory wheezing with bibasilar Rales Gastrointestinal: Soft, nontender, nondistended, + bowel sounds Ext: 1+  pedal edema bilaterally Neuro: Strength 5/5 upper and lower extremities bilaterally Psych: Normal affect     Data Reviewed:  I have personally reviewed following labs    CBC Lab Results  Component Value Date   WBC 8.4 02/01/2023   RBC 3.99 02/01/2023   HGB 10.8 (L) 02/01/2023   HCT 34.8 (L) 02/01/2023   MCV 87.2 02/01/2023   MCH 27.1 02/01/2023   PLT 208 02/01/2023   MCHC 31.0 02/01/2023   RDW 14.5 02/01/2023   LYMPHSABS 1.1 08/24/2020   MONOABS 0.3 08/24/2020   EOSABS 0.1 08/24/2020   BASOSABS 0.0 08/24/2020     Last metabolic panel Lab Results  Component Value Date   NA 133 (L)  02/01/2023   K 5.1 02/01/2023   CL 97 (L) 02/01/2023   CO2 23 02/01/2023   BUN 30 (H) 02/01/2023   CREATININE 1.31 (H) 02/01/2023   GLUCOSE 556 (HH) 02/01/2023   GFRNONAA 42 (  L) 02/01/2023   GFRAA 78 02/20/2020   CALCIUM 8.4 (L) 02/01/2023   PHOS 4.4 08/03/2008   PROT 6.8 01/31/2023   ALBUMIN 3.2 (L) 01/31/2023   BILITOT 0.5 01/31/2023   ALKPHOS 75 01/31/2023   AST 9 (L) 01/31/2023   ALT 11 01/31/2023   ANIONGAP 13 02/01/2023    CBG (last 3)  Recent Labs    01/31/23 2120  GLUCAP 347*      Coagulation Profile: No results for input(s): "INR", "PROTIME" in the last 168 hours.   Radiology Studies: I have personally reviewed the imaging studies  DG Chest Port 1 View  Result Date: 01/31/2023 CLINICAL DATA:  Shortness of breath. EXAM: PORTABLE CHEST 1 VIEW COMPARISON:  04/13/2022 FINDINGS: The heart is enlarged. Mediastinal contours are stable. Increase in bronchial and interstitial thickening suspicious for pulmonary edema. Trace fluid in the right minor fissure. No large subpulmonic effusion. No focal airspace disease. No pneumothorax. IMPRESSION: Cardiomegaly with increased bronchial and interstitial thickening suspicious for pulmonary edema. Electronically Signed   By: Narda Rutherford M.D.   On: 01/31/2023 15:46       Mario Coronado M.D. [facility] 02/01/2023, 1:19 PM  Available via Epic secure chat 7am-7pm After 7 pm, please refer to night coverage provider listed on amion.

## 2023-02-01 NOTE — ED Notes (Signed)
ED TO INPATIENT HANDOFF REPORT  Name/Age/Gender [name] 77 y.o. female  Code Status    Code Status Orders  (From admission, onward)           Start     Ordered   01/31/23 1813  Full code  Continuous       Question:  By:  Answer:  Consent: discussion documented in EHR   01/31/23 1814           Code Status History     Date Active Date Inactive Code Status Order ID Comments User Context   08/27/2020 1531 08/28/2020 1540 Full Code [id number]  Tia Alert, MD Inpatient   06/19/2017 2057 06/20/2017 1937 DNR [id number]  Gwynn Burly, DO ED   02/28/2017 2009 03/05/2017 2035 Full Code [id number]  Alleen Borne, MD Inpatient   02/28/2017 2009 02/28/2017 2009 DNR [id number]  Reymundo Poll, MD Inpatient   03/03/2015 0010 03/07/2015 1434 DNR [id number]  Eduard Clos, MD Inpatient       Home/SNF/Other Home  Chief Complaint Acute respiratory failure with hypoxia (HCC) [J96.01]  Level of Care/Admitting Diagnosis ED Disposition     ED Disposition  Admit   Condition  --   Comment  Hospital Area: [facility] [[id number]]  Level of Care: Progressive [102]  Admit to Progressive based on following criteria: CARDIOVASCULAR & THORACIC of moderate stability with acute coronary syndrome symptoms/low risk myocardial infarction/hypertensive urgency/arrhythmias/heart failure potentially compromising stability and stable post cardiovascular intervention patients.  May admit patient to [facility] or [facility] if equivalent level of care is available:: No  Covid Evaluation: Asymptomatic - no recent exposure (last 10 days) testing not required  Diagnosis: Acute respiratory failure with hypoxia [facility]) [[id number]]  Admitting Physician: Hannah Beat [[id number]]  Attending Physician: Hannah Beat [[id number]]  Certification:: I certify this patient will need inpatient services for at least 2 midnights  Expected Medical Readiness: 02/02/2023          Medical  History Past Medical History:  Diagnosis Date   Anxiety    Arthritis    generalized   Asthma    Bleeding nose    Cancer (HCC) 2017   Lung    COPD (chronic obstructive pulmonary disease) (HCC)    Diabetes mellitus    Diverticulitis    Dyspnea    Dysrhythmia    paraxysmol atrial fibrillation    H. pylori infection    Heart murmur    mild AS (03/16/20 echo)   Hypertension    Liver disease    Lung cancer (HCC)    stage 4 , with mets to bone.   Lung nodule 2017   Both lungs-[facility] at [facility].    Neuromuscular disorder (HCC)    bilateral feet   Parkinson disease (HCC) 2010   Sleep apnea    Tubular adenoma of colon 05/2007    Allergies Allergies  Allergen Reactions   Ozempic (0.25 Or 0.5 Mg-Dose) [Semaglutide(0.25 Or 0.5mg -Dos)] Diarrhea   Morphine And Codeine Nausea And Vomiting and Other (See Comments)    Headache (also)   Tape Rash and Other (See Comments)    Can only tolerate for short amounts of time (CANNOT BE "LEFT" ON THE SAME SITE FOR LONG)    IV Location/Drains/Wounds Patient Lines/Drains/Airways Status     Active Line/Drains/Airways     Name Placement date Placement time Site Days   Peripheral IV 01/31/23 20 G Posterior;Right Forearm 01/31/23  1406  Forearm  1  Labs/Imaging Results for orders placed or performed during the hospital encounter of 01/31/23 (from the past 48 hour(s))  Resp panel by RT-PCR (RSV, Flu A&B, Covid) Anterior Nasal Swab     Status: None   Collection Time: 01/31/23  1:58 PM   Specimen: Anterior Nasal Swab  Result Value Ref Range   SARS Coronavirus 2 by RT PCR NEGATIVE NEGATIVE    Comment: (NOTE) SARS-CoV-2 target nucleic acids are NOT DETECTED.  The SARS-CoV-2 RNA is generally detectable in upper respiratory specimens during the acute phase of infection. The lowest concentration of SARS-CoV-2 viral copies this assay can detect is 138 copies/mL. A negative result does not preclude SARS-Cov-2 infection and should not be  used as the sole basis for treatment or other patient management decisions. A negative result may occur with  improper specimen collection/handling, submission of specimen other than nasopharyngeal swab, presence of viral mutation(s) within the areas targeted by this assay, and inadequate number of viral copies(<138 copies/mL). A negative result must be combined with clinical observations, patient history, and epidemiological information. The expected result is Negative.  Fact Sheet for Patients:  BloggerCourse.com  Fact Sheet for Healthcare Providers:  SeriousBroker.it  This test is no t yet approved or cleared by the [place] FDA and  has been authorized for detection and/or diagnosis of SARS-CoV-2 by FDA under an Emergency Use Authorization (EUA). This EUA will remain  in effect (meaning this test can be used) for the duration of the COVID-19 declaration under Section 564(b)(1) of the Act, 21 U.S.C.section 360bbb-3(b)(1), unless the authorization is terminated  or revoked sooner.       Influenza A by PCR NEGATIVE NEGATIVE   Influenza B by PCR NEGATIVE NEGATIVE    Comment: (NOTE) The Xpert Xpress SARS-CoV-2/FLU/RSV plus assay is intended as an aid in the diagnosis of influenza from Nasopharyngeal swab specimens and should not be used as a sole basis for treatment. Nasal washings and aspirates are unacceptable for Xpert Xpress SARS-CoV-2/FLU/RSV testing.  Fact Sheet for Patients: BloggerCourse.com  Fact Sheet for Healthcare Providers: SeriousBroker.it  This test is not yet approved or cleared by the [place] FDA and has been authorized for detection and/or diagnosis of SARS-CoV-2 by FDA under an Emergency Use Authorization (EUA). This EUA will remain in effect (meaning this test can be used) for the duration of the COVID-19 declaration under Section 564(b)(1) of the  Act, 21 U.S.C. section 360bbb-3(b)(1), unless the authorization is terminated or revoked.     Resp Syncytial Virus by PCR NEGATIVE NEGATIVE    Comment: (NOTE) Fact Sheet for Patients: BloggerCourse.com  Fact Sheet for Healthcare Providers: SeriousBroker.it  This test is not yet approved or cleared by the [place] FDA and has been authorized for detection and/or diagnosis of SARS-CoV-2 by FDA under an Emergency Use Authorization (EUA). This EUA will remain in effect (meaning this test can be used) for the duration of the COVID-19 declaration under Section 564(b)(1) of the Act, 21 U.S.C. section 360bbb-3(b)(1), unless the authorization is terminated or revoked.  Performed at [facility], [address], [place], [place] [address]   Comprehensive metabolic panel     Status: Abnormal   Collection Time: 01/31/23  1:58 PM  Result Value Ref Range   Sodium 138 135 - 145 mmol/L   Potassium 4.1 3.5 - 5.1 mmol/L   Chloride 104 98 - 111 mmol/L   CO2 24 22 - 32 mmol/L   Glucose, Bld 145 (H) 70 - 99 mg/dL  Comment: Glucose reference range applies only to samples taken after fasting for at least 8 hours.   BUN 21 8 - 23 mg/dL   Creatinine, Ser 0.98 (H) 0.44 - 1.00 mg/dL   Calcium 8.7 (L) 8.9 - 10.3 mg/dL   Total Protein 6.8 6.5 - 8.1 g/dL   Albumin 3.2 (L) 3.5 - 5.0 g/dL   AST 9 (L) 15 - 41 U/L   ALT 11 0 - 44 U/L   Alkaline Phosphatase 75 38 - 126 U/L   Total Bilirubin 0.5 0.3 - 1.2 mg/dL   GFR, Estimated 54 (L) >60 mL/min    Comment: (NOTE) Calculated using the CKD-EPI Creatinine Equation (2021)    Anion gap 10 5 - 15    Comment: Performed at [facility], [address], [place], [place] [address]  CBC     Status: Abnormal   Collection Time: 01/31/23  1:58 PM  Result Value Ref Range   WBC 9.1 4.0 - 10.5 K/uL   RBC 3.88 3.87 - 5.11 MIL/uL   Hemoglobin 10.5 (L) 12.0 - 15.0 g/dL   HCT  78.2 (L) 95.6 - 46.0 %   MCV 85.6 80.0 - 100.0 fL   MCH 27.1 26.0 - 34.0 pg   MCHC 31.6 30.0 - 36.0 g/dL   RDW 21.3 08.6 - 57.8 %   Platelets 210 150 - 400 K/uL   nRBC 0.0 0.0 - 0.2 %    Comment: Performed at [facility], [address], [place], [place] [address]  Brain natriuretic peptide     Status: Abnormal   Collection Time: 01/31/23  4:30 PM  Result Value Ref Range   B Natriuretic Peptide 147.6 (H) 0.0 - 100.0 pg/mL    Comment: Performed at [facility], [address], [place], [place] [address]  CBG monitoring, ED     Status: Abnormal   Collection Time: 01/31/23  9:20 PM  Result Value Ref Range   Glucose-Capillary 347 (H) 70 - 99 mg/dL    Comment: Glucose reference range applies only to samples taken after fasting for at least 8 hours.  Basic metabolic panel     Status: Abnormal   Collection Time: 02/01/23  6:09 AM  Result Value Ref Range   Sodium 133 (L) 135 - 145 mmol/L   Potassium 5.1 3.5 - 5.1 mmol/L   Chloride 97 (L) 98 - 111 mmol/L   CO2 23 22 - 32 mmol/L   Glucose, Bld 556 (HH) 70 - 99 mg/dL    Comment: CRITICAL RESULT CALLED TO, READ BACK BY AND VERIFIED WITH ORE, B. RN AT [phone] ON 02/01/23. FA Glucose reference range applies only to samples taken after fasting for at least 8 hours.    BUN 30 (H) 8 - 23 mg/dL   Creatinine, Ser 4.01 (H) 0.44 - 1.00 mg/dL   Calcium 8.4 (L) 8.9 - 10.3 mg/dL   GFR, Estimated 42 (L) >60 mL/min    Comment: (NOTE) Calculated using the CKD-EPI Creatinine Equation (2021)    Anion gap 13 5 - 15    Comment: Performed at [facility], [address] [place], [place] [address]  CBC     Status: Abnormal   Collection Time: 02/01/23  6:09 AM  Result Value Ref Range   WBC 8.4 4.0 - 10.5 K/uL   RBC 3.99 3.87 - 5.11 MIL/uL   Hemoglobin 10.8 (L) 12.0 - 15.0 g/dL   HCT 36.6 (L) 44.0 - 34.7 %  MCV 87.2 80.0 - 100.0 fL   MCH 27.1 26.0 - 34.0 pg   MCHC 31.0 30.0 - 36.0 g/dL   RDW 40.9  81.1 - 91.4 %   Platelets 208 150 - 400 K/uL   nRBC 0.0 0.0 - 0.2 %    Comment: Performed at [facility], [address] [place], [place] [address]   DG Chest Port 1 View  Result Date: 01/31/2023 CLINICAL DATA:  Shortness of breath. EXAM: PORTABLE CHEST 1 VIEW COMPARISON:  04/13/2022 FINDINGS: The heart is enlarged. Mediastinal contours are stable. Increase in bronchial and interstitial thickening suspicious for pulmonary edema. Trace fluid in the right minor fissure. No large subpulmonic effusion. No focal airspace disease. No pneumothorax. IMPRESSION: Cardiomegaly with increased bronchial and interstitial thickening suspicious for pulmonary edema. Electronically Signed   By: Narda Rutherford M.D.   On: 01/31/2023 15:46    Pending Labs Unresulted Labs (From admission, onward)     Start     Ordered   02/02/23 0500  CBC  Daily,   R      02/01/23 0643   02/02/23 0500  Basic metabolic panel  Daily,   R      02/01/23 0643   02/01/23 1227  Hemoglobin A1c  Once,   R       Comments: To assess prior glycemic control    02/01/23 1226            Vitals/Pain Today's Vitals   02/01/23 0900 02/01/23 1000 02/01/23 1130 02/01/23 1345  BP: 139/71  (!) 140/60   Pulse: 76  68   Resp: 14  16   Temp:  97.7 F (36.5 C)  (!) 97.5 F (36.4 C)  TempSrc:  Oral  Oral  SpO2: 90%  96%   Weight:      Height:      PainSc:    5     Isolation Precautions No active isolations  Medications Medications  albuterol (PROVENTIL) (2.5 MG/3ML) 0.083% nebulizer solution (  Not Given 01/31/23 1435)  aspirin chewable tablet 81-162 mg (has no administration in time range)  atorvastatin (LIPITOR) tablet 10 mg (10 mg Oral Given by Other 02/01/23 0957)  diltiazem (CARDIZEM CD) 24 hr capsule 120 mg (120 mg Oral Given 02/01/23 1015)  nitroGLYCERIN (NITROSTAT) SL tablet 0.4 mg (has no administration in time range)  pantoprazole (PROTONIX) EC tablet 40 mg (40 mg Oral Given by Other 02/01/23  0957)  carbidopa-levodopa (SINEMET IR) 25-100 MG per tablet immediate release 1 tablet (1 tablet Oral Given 02/01/23 1015)  pregabalin (LYRICA) capsule 75 mg (75 mg Oral Given 01/31/23 2121)  furosemide (LASIX) injection 40 mg (40 mg Intravenous Given 02/01/23 0738)  enoxaparin (LOVENOX) injection 60 mg (60 mg Subcutaneous Given 01/31/23 2341)  acetaminophen (TYLENOL) tablet 650 mg (has no administration in time range)    Or  acetaminophen (TYLENOL) suppository 650 mg (has no administration in time range)  traZODone (DESYREL) tablet 25 mg (has no administration in time range)  magnesium hydroxide (MILK OF MAGNESIA) suspension 30 mL (has no administration in time range)  ondansetron (ZOFRAN) tablet 4 mg (has no administration in time range)    Or  ondansetron (ZOFRAN) injection 4 mg (has no administration in time range)  guaiFENesin (MUCINEX) 12 hr tablet 600 mg (600 mg Oral Given 02/01/23 1002)  chlorpheniramine-HYDROcodone (TUSSIONEX) 10-8 MG/5ML suspension 5 mL (has no administration in time range)  oxyCODONE (Oxy IR/ROXICODONE) immediate release tablet 20 mg (has no administration in time range)  ALPRAZolam (XANAX) tablet 1 mg (1 mg Oral Given by Other 02/01/23 0957)  ALPRAZolam Prudy Feeler) tablet 1 mg (has no administration in time range)  hydrocortisone (CORTEF) tablet 15 mg (15 mg Oral Given 02/01/23 1014)  hydrocortisone (CORTEF) tablet 10 mg (10 mg Oral Given 01/31/23 2341)  cholecalciferol (VITAMIN D3) 25 MCG (1000 UNIT) tablet 5,000 Units (5,000 Units Oral Given 02/01/23 1001)  fentaNYL (DURAGESIC) 100 MCG/HR 1 patch (1 patch Transdermal Patch Applied 02/01/23 1300)    And  fentaNYL (DURAGESIC) 25 MCG/HR 1 patch (1 patch Transdermal Patch Applied 02/01/23 1300)  insulin glargine-yfgn (SEMGLEE) injection 60 Units (0 Units Subcutaneous Hold 02/01/23 1015)  insulin glargine-yfgn (SEMGLEE) injection 40 Units (40 Units Subcutaneous Given 01/31/23 2122)  cefTRIAXone (ROCEPHIN) 1 g in sodium  chloride 0.9 % 100 mL IVPB (0 g Intravenous Stopped 02/01/23 0021)  budesonide (PULMICORT) nebulizer solution 0.25 mg (0.25 mg Nebulization Given 02/01/23 0806)  arformoterol (BROVANA) nebulizer solution 15 mcg (15 mcg Nebulization Given 02/01/23 0806)  ipratropium-albuterol (DUONEB) 0.5-2.5 (3) MG/3ML nebulizer solution 3 mL (has no administration in time range)  albuterol (PROVENTIL) (2.5 MG/3ML) 0.083% nebulizer solution 2.5 mg (has no administration in time range)  insulin aspart (novoLOG) injection 0-5 Units (has no administration in time range)  insulin aspart (novoLOG) injection 0-15 Units (has no administration in time range)  insulin aspart (novoLOG) injection 3 Units (3 Units Subcutaneous Given 02/01/23 1330)  sertraline (ZOLOFT) tablet 100 mg (has no administration in time range)  ipratropium (ATROVENT) nebulizer solution 0.5 mg (0.5 mg Nebulization Given 01/31/23 1422)  albuterol (PROVENTIL) (2.5 MG/3ML) 0.083% nebulizer solution 5 mg (5 mg Nebulization Given 01/31/23 1422)  magnesium sulfate IVPB 2 g 50 mL (0 g Intravenous Stopped 01/31/23 1554)  furosemide (LASIX) injection 40 mg (40 mg Intravenous Given 01/31/23 1625)  potassium chloride (KLOR-CON) packet 20 mEq (20 mEq Oral Given 01/31/23 1625)    Mobility walks with device

## 2023-02-01 NOTE — ED Notes (Signed)
Lab called to notify writer of pts CBG. This Clinical research associate notified EDP and consulting with hospitalist.

## 2023-02-01 NOTE — Progress Notes (Signed)
RT set up and placed cpap at bedside. Pt stated she could place self on machine when ready for bed.

## 2023-02-02 DIAGNOSIS — J441 Chronic obstructive pulmonary disease with (acute) exacerbation: Secondary | ICD-10-CM | POA: Diagnosis not present

## 2023-02-02 DIAGNOSIS — E1142 Type 2 diabetes mellitus with diabetic polyneuropathy: Secondary | ICD-10-CM | POA: Diagnosis not present

## 2023-02-02 DIAGNOSIS — J9601 Acute respiratory failure with hypoxia: Secondary | ICD-10-CM | POA: Diagnosis not present

## 2023-02-02 DIAGNOSIS — I5033 Acute on chronic diastolic (congestive) heart failure: Secondary | ICD-10-CM | POA: Diagnosis not present

## 2023-02-02 LAB — BASIC METABOLIC PANEL
Anion gap: 12 (ref 5–15)
BUN: 42 mg/dL — ABNORMAL HIGH (ref 8–23)
CO2: 26 mmol/L (ref 22–32)
Calcium: 8.7 mg/dL — ABNORMAL LOW (ref 8.9–10.3)
Chloride: 94 mmol/L — ABNORMAL LOW (ref 98–111)
Creatinine, Ser: 1.24 mg/dL — ABNORMAL HIGH (ref 0.44–1.00)
GFR, Estimated: 45 mL/min — ABNORMAL LOW (ref >60)
Glucose, Bld: 438 mg/dL — ABNORMAL HIGH (ref 70–99)
Potassium: 4.2 mmol/L (ref 3.5–5.1)
Sodium: 132 mmol/L — ABNORMAL LOW (ref 135–145)

## 2023-02-02 LAB — GLUCOSE, CAPILLARY
Glucose-Capillary: 484 mg/dL — ABNORMAL HIGH (ref 70–99)
Glucose-Capillary: 459 mg/dL — ABNORMAL HIGH (ref 70–99)
Glucose-Capillary: 376 mg/dL — ABNORMAL HIGH (ref 70–99)
Glucose-Capillary: 279 mg/dL — ABNORMAL HIGH (ref 70–99)
Glucose-Capillary: 285 mg/dL — ABNORMAL HIGH (ref 70–99)
Glucose-Capillary: 211 mg/dL — ABNORMAL HIGH (ref 70–99)
Glucose-Capillary: 136 mg/dL — ABNORMAL HIGH (ref 70–99)

## 2023-02-02 LAB — CBC
HCT: 31.4 % — ABNORMAL LOW (ref 36.0–46.0)
Hemoglobin: 10 g/dL — ABNORMAL LOW (ref 12.0–15.0)
MCH: 26.8 pg (ref 26.0–34.0)
MCHC: 31.8 g/dL (ref 30.0–36.0)
MCV: 84.2 fL (ref 80.0–100.0)
Platelets: 221 10*3/uL (ref 150–400)
RBC: 3.73 MIL/uL — ABNORMAL LOW (ref 3.87–5.11)
RDW: 14.4 % (ref 11.5–15.5)
WBC: 10.2 10*3/uL (ref 4.0–10.5)
nRBC: 0 % (ref 0.0–0.2)

## 2023-02-02 LAB — BETA-HYDROXYBUTYRIC ACID
Beta-Hydroxybutyric Acid: 0.08 mmol/L (ref 0.05–0.27)
Beta-Hydroxybutyric Acid: 0.08 mmol/L (ref 0.05–0.27)
Beta-Hydroxybutyric Acid: 0.08 mmol/L (ref 0.05–0.27)
Beta-Hydroxybutyric Acid: 0.16 mmol/L (ref 0.05–0.27)

## 2023-02-02 MED ORDER — INSULIN GLARGINE-YFGN 100 UNIT/ML ~~LOC~~ SOLN
60.0000 [IU] | Freq: Two times a day (BID) | SUBCUTANEOUS | Status: DC
  Administered 2023-02-02 – 2023-02-03 (×4): 60 [IU] via SUBCUTANEOUS
  Filled 2023-02-02 (×6): qty 0.6

## 2023-02-02 MED ORDER — INSULIN ASPART 100 UNIT/ML IJ SOLN
10.0000 [IU] | Freq: Three times a day (TID) | INTRAMUSCULAR | Status: DC
  Administered 2023-02-02 – 2023-02-04 (×7): 10 [IU] via SUBCUTANEOUS

## 2023-02-02 MED ORDER — INSULIN ASPART 100 UNIT/ML IJ SOLN: 0.0000 [IU] | Freq: Every day | INTRAMUSCULAR | Status: DC

## 2023-02-02 MED ORDER — AZITHROMYCIN 500 MG PO TABS
500.0000 mg | ORAL_TABLET | Freq: Every day | ORAL | Status: DC
  Administered 2023-02-02 – 2023-02-03 (×2): 500 mg via ORAL
  Filled 2023-02-02: qty 1
  Filled 2023-02-02: qty 2

## 2023-02-02 MED ORDER — INSULIN ASPART 100 UNIT/ML IJ SOLN
0.0000 [IU] | Freq: Three times a day (TID) | INTRAMUSCULAR | Status: DC
  Administered 2023-02-02: 7 [IU] via SUBCUTANEOUS
  Administered 2023-02-02 (×2): 11 [IU] via SUBCUTANEOUS
  Administered 2023-02-03 (×3): 4 [IU] via SUBCUTANEOUS
  Administered 2023-02-04: 3 [IU] via SUBCUTANEOUS

## 2023-02-02 NOTE — Progress Notes (Signed)
   02/02/23 1917  BiPAP/CPAP/SIPAP  BiPAP/CPAP/SIPAP Pt Type Adult (prefers self placement)  BiPAP/CPAP/SIPAP DREAMSTATIOND  Mask Type Nasal mask  EPAP 13 cmH2O  FiO2 (%) 21 %  Patient Home Equipment No  Auto Titrate No  CPAP/SIPAP surface wiped down Yes  BiPAP/CPAP /SiPAP Vitals  Pulse Rate 65  Resp 19  MEWS Score/Color  MEWS Score 0  MEWS Score Color Green

## 2023-02-02 NOTE — TOC Initial Note (Signed)
Transition of Care [facility]) - Initial/Assessment Note    Patient Details  Name: [name] MRN: [id number] Date of Birth: 1945-05-18  Transition of Care [facility]) CM/SW Contact:    Lanier Clam, RN Phone Number: 02/02/2023, 3:48 PM  Clinical Narrative:  d/c plan home.                 Expected Discharge Plan: Home/Self Care Barriers to Discharge: Continued Medical Work up   Patient Goals and CMS Choice Patient states their goals for this hospitalization and ongoing recovery are:: Home CMS Medicare.gov Compare Post Acute Care list provided to:: Patient Represenative (must comment) Choice offered to / list presented to : Adult Children [place] ownership interest in [facility]provided to:: Adult Children    Expected Discharge Plan and Services       Living arrangements for the past 2 months: Single Family Home                                      Prior Living Arrangements/Services Living arrangements for the past 2 months: Single Family Home Lives with:: Adult Children                   Activities of Daily Living   ADL Screening (condition at time of admission) Independently performs ADLs?: Yes (appropriate for developmental age) Is the patient deaf or have difficulty hearing?: No Does the patient have difficulty seeing, even when wearing glasses/contacts?: No Does the patient have difficulty concentrating, remembering, or making decisions?: No  Permission Sought/Granted                  Emotional Assessment              Admission diagnosis:  Acute respiratory failure with hypoxia (HCC) [J96.01] Patient Active Problem List   Diagnosis Date Noted   Acute respiratory failure with hypoxia (HCC) 01/31/2023   Acute on chronic diastolic CHF (congestive heart failure) (HCC) 01/31/2023   COPD with acute exacerbation (HCC) 01/31/2023   Essential hypertension 01/31/2023   Anxiety and depression 01/31/2023   OSA on CPAP 05/02/2022    Aortic stenosis, mild 09/06/2021   S/P lumbar fusion 08/27/2020   Morbid obesity (HCC) 02/20/2020   Paroxysmal atrial fibrillation (HCC) 02/20/2020   Microcytic anemia 02/20/2020   COPD (chronic obstructive pulmonary disease) (HCC) 06/19/2017   Non-small cell carcinoma of lung, stage 4 (HCC) 06/19/2017   Parkinson's disease (HCC) 06/19/2017   Pericardial effusion 02/28/2017   Chest pain 02/26/2016   Colitis 03/02/2015   Diabetes mellitus type 2, controlled (HCC) 03/02/2015   Hyperlipidemia 03/02/2015   OSA and COPD overlap syndrome (HCC) 05/10/2012   Essential tremor 03/19/2012   DIVERTICULITIS OF COLON 02/03/2008   ANEMIA, IRON DEFICIENCY 12/27/2007   HEMORRHOIDS, INTERNAL 12/27/2007   GERD 12/27/2007   GASTRITIS 12/27/2007   DIVERTICULOSIS, COLON 12/27/2007   PUD, HX OF 12/27/2007   History of colonic polyps 12/27/2007   Generalized anxiety disorder 04/23/2007   Diabetes mellitus with coincident hypertension (HCC) 04/23/2007   ARTHRITIS 04/23/2007   Sleep apnea 04/23/2007   Type 2 diabetes mellitus with peripheral neuropathy (HCC) 04/25/1991   PCP:  [name], MD Pharmacy:   [id number] - [place], [place] - [address] AT [facility] [address] & [address] [place] [address] Phone: [phone] Fax: [phone]     Social Determinants  of Health (SDOH) Social History: SDOH Screenings   Food Insecurity: No Food Insecurity (02/01/2023)  Housing: Low Risk  (02/01/2023)  Transportation Needs: No Transportation Needs (02/01/2023)  Utilities: Not At Risk (02/01/2023)  Tobacco Use: Medium Risk (01/31/2023)   SDOH Interventions:     Readmission Risk Interventions     No data to display

## 2023-02-02 NOTE — Plan of Care (Signed)
  Problem: Education: Goal: Ability to verbalize understanding of medication therapies will improve Outcome: Progressing   Problem: Activity: Goal: Capacity to carry out activities will improve Outcome: Progressing   Problem: Metabolic: Goal: Ability to maintain appropriate glucose levels will improve Outcome: Progressing

## 2023-02-02 NOTE — Inpatient Diabetes Management (Signed)
Inpatient Diabetes Program Recommendations  AACE/ADA: New Consensus Statement on Inpatient Glycemic Control (2015)  Target Ranges:  Prepandial:   less than 140 mg/dL      Peak postprandial:   less than 180 mg/dL (1-2 hours)      Critically ill patients:  140 - 180 mg/dL   Lab Results  Component Value Date   GLUCAP 285 (H) 02/02/2023   HGBA1C 10.8 (H) 02/01/2023    Review of Glycemic Control  Diabetes history: DM2 Outpatient Diabetes medications: Lantus 60 in am and 40 at bedtime, Janumet 50-500 BID, Humalog 3-4 units prn for BG > 300 mg/dL, Cortef 15 in am and 10 mg QHS Current orders for Inpatient glycemic control: Semglee 60 BID, Novolog 0-20 TID with meals and 0-5 HS + 10 units TID  HgbA1C - 10.8% 279-285 mg/dL Eating 54% meals  Inpatient Diabetes Program Recommendations:    Agree with orders.   Spoke with pt at bedside regarding her diabetes control and HgbA1C of 10.8%. Pt states she checks blood sugars several times/day and has PCP who manages her diabetes. Pt states she has occasional hypos during the night. She wakes up and then walks to kitchen to get juice and check blood sugar. Instructed pt to keep regular soda and meter at bedside table to prevent pt from getting out of bed and falling while walking to kitchen. Pt states she will do this. Pt also said she didn't take her full Lantus dose at night if blood sugars were low normal. Instructed pt to call PCP and send blood sugar log to office for review. Discussed healthy eating, trying to move around more frequently and managing stress, will all play role in glucose control. Goal of 8% for HgbA1C. Steroids likely exacerbating blood sugars. Answered all questions.  Pt appreciative of visit.  Thank you. Ailene Ards, RD, LDN, CDCES Inpatient Diabetes Coordinator [phone]

## 2023-02-02 NOTE — Plan of Care (Signed)
Discussed with patient plan of care for the evening, pain management and medications with some teach back displayed,  What is important is going for a walk prior to bed time  Problem: Education: Goal: Ability to demonstrate management of disease process will improve Outcome: Progressing   Problem: Activity: Goal: Capacity to carry out activities will improve Outcome: Progressing

## 2023-02-02 NOTE — Progress Notes (Signed)
[facility]                                                                              See Omalley, is a 77 y.o. female, DOB - Apr 27, 1945, [id number] Admit date - 01/31/2023    Outpatient Primary MD for the patient is [name], MD  LOS - 2  days  Chief Complaint  Patient presents with   Shortness of Breath       Brief summary   Patient is a 77 year old female with stage IV lung cancer, no longer on treatment, asthma, COPD, diabetes mellitus type 2, OSA, Parkinson's disease presented to ED with acute worsening of shortness of breath with minimal exertion, cough and hypoxia.  Patient reported having cough about 3 weeks ago and progressively worsened, currently productive of thick green to greenish sputum.  Not on home O2.  Found to be hypoxic with O2 sats 85% on room air.  She also reported lower extremity edema, wheezing with dyspnea and cough, 3 pillow orthopnea.  No fevers. In ED, noted to be hypoxic to 86-89% on room air, placed on 2 L, O2 sats 94%.  Creatinine 1.07, BNP 147.6  COVID PCR negative.  Chest x-ray showed cardiomegaly with increased bronchial and interstitial thickening suspicious for pulmonary edema  Assessment & Plan    Principal Problem:   Acute respiratory failure with hypoxia (HCC) secondary to COPD exacerbation, acute on chronic diastolic CHF -Continue current management, IV Lasix, bronchodilators -Wean O2 as tolerated, improving, O2 sats 93% on room air. Will need home O2 evaluation prior to discharge  Active Problems:   Acute on chronic diastolic CHF (congestive heart failure) (HCC) -Presented with hypoxia, orthopnea, chest x-ray with pulmonary edema, elevated BNP -Continue IV Lasix 40 mg every 12 hours -Follow 2D echo, strict I's and O's and daily weights     COPD with acute exacerbation (HCC) -Wheezing improving today, -Continue DuoNebs, Pulmicort, Brovana, flutter valve -Continue oral Zithromax    Type 2 diabetes  mellitus with peripheral neuropathy (HCC), uncontrolled with hyperglycemia CBG (last 3)  Recent Labs    02/02/23 0632 02/02/23 0752 02/02/23 1148  GLUCAP 376* 279* 285*   -CBGs improving today, IV Solu-Medrol discontinued since yesterday -Hemoglobin A1c 10.8 on 02/01/2023 -Increase Semglee to 60 units twice daily, add NovoLog 10 units 3 times daily AC, continue SSI resistant -Diabetic coordinator consult placed    Sleep apnea -Continue CPAP    Parkinson's disease (HCC) -Continue Sinemet    Essential hypertension -Continue Cardizem, Lasix BP     Anxiety and depression Continue Zoloft   Morbid obesity Estimated body mass index is 46.2 kg/m as calculated from the following:   Height as of this encounter: 5\' 3"  (1.6 m).   Weight as of this encounter: 118.3 kg.  Code Status: Full code DVT Prophylaxis:  Lovenox  Level of Care: Level of care: Progressive Family Communication: Updated patient Disposition Plan:      Remains inpatient appropriate: Improving, DC likely in next 24 to 48 hours if continues to improve.  Feels winded on ambulation   Procedures:  None  Consultants:   None  Antimicrobials:   Anti-infectives (From admission, onward)  Start     Dose/Rate Route Frequency Ordered Stop   02/02/23 2000  azithromycin (ZITHROMAX) tablet 500 mg        500 mg Oral Daily 02/02/23 1138 02/05/23 1959   01/31/23 2100  cefTRIAXone (ROCEPHIN) 1 g in sodium chloride 0.9 % 100 mL IVPB  Status:  Discontinued        1 g 200 mL/hr over 30 Minutes Intravenous Every 24 hours 01/31/23 2011 02/02/23 1138          Medications  ALPRAZolam  1 mg Oral BID   arformoterol  15 mcg Nebulization BID   atorvastatin  10 mg Oral Daily   azithromycin  500 mg Oral Daily   budesonide (PULMICORT) nebulizer solution  0.25 mg Nebulization BID   carbidopa-levodopa  1 tablet Oral BID   cholecalciferol  5,000 Units Oral Daily   diltiazem  120 mg Oral Daily   enoxaparin (LOVENOX)  injection  60 mg Subcutaneous Q24H   fentaNYL  1 patch Transdermal QODAY   And   fentaNYL  1 patch Transdermal Q72H   furosemide  40 mg Intravenous BID   guaiFENesin  600 mg Oral BID   hydrocortisone  10 mg Oral QHS   hydrocortisone  15 mg Oral Daily   insulin aspart  0-20 Units Subcutaneous TID WC   insulin aspart  0-5 Units Subcutaneous QHS   insulin aspart  10 Units Subcutaneous TID WC   insulin glargine-yfgn  60 Units Subcutaneous BID   ipratropium-albuterol  3 mL Nebulization BID   pantoprazole  40 mg Oral Daily   pregabalin  75 mg Oral QHS   sertraline  100 mg Oral QHS      Subjective:   [name] was seen and examined today.  Today feeling somewhat better.  Still has winded on ambulation.  O2 sats 93% on room air.  Denies any chest pain, abdominal pain, nausea vomiting.  CBGs improving.  Objective:   Vitals:   02/02/23 0453 02/02/23 0524 02/02/23 0759 02/02/23 1153  BP: (!) 120/56   (!) 107/57  Pulse: 60   62  Resp: 20   16  Temp: 98.6 F (37 C)   98.4 F (36.9 C)  TempSrc: Oral   Oral  SpO2: 93%  95% 93%  Weight:  118.3 kg    Height:        Intake/Output Summary (Last 24 hours) at 02/02/2023 1337 Last data filed at 02/02/2023 0830 Gross per 24 hour  Intake 561.76 ml  Output 400 ml  Net 161.76 ml     Wt Readings from Last 3 Encounters:  02/02/23 118.3 kg  07/20/22 118.6 kg  05/01/22 118.1 kg    Physical Exam General: Alert and oriented x 3, NAD Cardiovascular: S1 S2 clear, RRR.  Respiratory: Diminished breath under the bases, scattered wheezing Gastrointestinal: Soft, nontender, nondistended, NBS Ext: 1+ pedal edema bilaterally Neuro: no new deficits Psych: Normal affect     Data Reviewed:  I have personally reviewed following labs    CBC Lab Results  Component Value Date   WBC 10.2 02/02/2023   RBC 3.73 (L) 02/02/2023   HGB 10.0 (L) 02/02/2023   HCT 31.4 (L) 02/02/2023   MCV 84.2 02/02/2023   MCH 26.8 02/02/2023   PLT 221  02/02/2023   MCHC 31.8 02/02/2023   RDW 14.4 02/02/2023   LYMPHSABS 1.1 08/24/2020   MONOABS 0.3 08/24/2020   EOSABS 0.1 08/24/2020   BASOSABS 0.0 08/24/2020     Last metabolic panel  Lab Results  Component Value Date   NA 132 (L) 02/02/2023   K 4.2 02/02/2023   CL 94 (L) 02/02/2023   CO2 26 02/02/2023   BUN 42 (H) 02/02/2023   CREATININE 1.24 (H) 02/02/2023   GLUCOSE 438 (H) 02/02/2023   GFRNONAA 45 (L) 02/02/2023   GFRAA 78 02/20/2020   CALCIUM 8.7 (L) 02/02/2023   PHOS 4.4 08/03/2008   PROT 6.8 01/31/2023   ALBUMIN 3.2 (L) 01/31/2023   BILITOT 0.5 01/31/2023   ALKPHOS 75 01/31/2023   AST 9 (L) 01/31/2023   ALT 11 01/31/2023   ANIONGAP 12 02/02/2023    CBG (last 3)  Recent Labs    02/02/23 0632 02/02/23 0752 02/02/23 1148  GLUCAP 376* 279* 285*      Coagulation Profile: No results for input(s): "INR", "PROTIME" in the last 168 hours.   Radiology Studies: I have personally reviewed the imaging studies  DG Chest Port 1 View  Result Date: 01/31/2023 CLINICAL DATA:  Shortness of breath. EXAM: PORTABLE CHEST 1 VIEW COMPARISON:  04/13/2022 FINDINGS: The heart is enlarged. Mediastinal contours are stable. Increase in bronchial and interstitial thickening suspicious for pulmonary edema. Trace fluid in the right minor fissure. No large subpulmonic effusion. No focal airspace disease. No pneumothorax. IMPRESSION: Cardiomegaly with increased bronchial and interstitial thickening suspicious for pulmonary edema. Electronically Signed   By: Narda Rutherford M.D.   On: 01/31/2023 15:46       Lissett Favorite M.D. [facility] 02/02/2023, 1:37 PM  Available via Epic secure chat 7am-7pm After 7 pm, please refer to night coverage provider listed on amion.

## 2023-02-02 NOTE — Progress Notes (Signed)
Mobility Specialist - Progress Note  Pre-mobility: 94 bpm 96% SpO2 During mobility: 87 bpm SpO2 Post-mobility: 82 bpm HR, 95 bpm SPO2   02/02/23 1012  Oxygen Therapy  O2 Device Room Air  Mobility  Activity Ambulated with assistance in hallway  Level of Assistance Contact guard assist, steadying assist  Assistive Device None  Distance Ambulated (ft) 100 ft  Range of Motion/Exercises Active Assistive  Activity Response Tolerated fair  Mobility Referral Yes  $Mobility charge 1 Mobility  Mobility Specialist Start Time (ACUTE ONLY) 1000  Mobility Specialist Stop Time (ACUTE ONLY) 1012  Mobility Specialist Time Calculation (min) (ACUTE ONLY) 12 min   Pt was found in bed and agreeable to ambulate after bathroom use. Stated feeling SOB with session and had audible wheezing. Used hallway rails towards EOS. Returned to recliner chair and able to increase SPO2 >90% in <14min. Was left on recliner chair with all needs met. Call bell in reach and RN notified.  Billey Chang Mobility Specialist

## 2023-02-02 NOTE — Progress Notes (Signed)
Heart Failure Navigator Progress Note  Assessed for Heart & Vascular TOC clinic readiness.  Patient does not meet criteria due to EF 60-65%, has a scheduled appointment with Dr. Izora Ribas on 02/26/2023.   Navigator will sign off at this time.   Rhae Hammock, BSN, Scientist, clinical (histocompatibility and immunogenetics) Only

## 2023-02-03 ENCOUNTER — Inpatient Hospital Stay (HOSPITAL_COMMUNITY): Payer: Medicare Other

## 2023-02-03 DIAGNOSIS — I5033 Acute on chronic diastolic (congestive) heart failure: Secondary | ICD-10-CM | POA: Diagnosis not present

## 2023-02-03 DIAGNOSIS — J441 Chronic obstructive pulmonary disease with (acute) exacerbation: Secondary | ICD-10-CM | POA: Diagnosis not present

## 2023-02-03 DIAGNOSIS — E1142 Type 2 diabetes mellitus with diabetic polyneuropathy: Secondary | ICD-10-CM | POA: Diagnosis not present

## 2023-02-03 DIAGNOSIS — J9601 Acute respiratory failure with hypoxia: Secondary | ICD-10-CM | POA: Diagnosis not present

## 2023-02-03 LAB — GLUCOSE, CAPILLARY
Glucose-Capillary: 158 mg/dL — ABNORMAL HIGH (ref 70–99)
Glucose-Capillary: 185 mg/dL — ABNORMAL HIGH (ref 70–99)
Glucose-Capillary: 174 mg/dL — ABNORMAL HIGH (ref 70–99)
Glucose-Capillary: 102 mg/dL — ABNORMAL HIGH (ref 70–99)

## 2023-02-03 LAB — ECHOCARDIOGRAM COMPLETE
AR max vel: 1.94 cm2
AV Area VTI: 2.16 cm2
AV Area mean vel: 1.89 cm2
AV Mean grad: 16 mm[Hg]
AV Peak grad: 24.8 mm[Hg]
Ao pk vel: 2.49 m/s
Area-P 1/2: 2.56 cm2
Calc EF: 61.4 %
Height: 63 in
MV VTI: 3 cm2
S' Lateral: 3.6 cm
Single Plane A2C EF: 64.3 %
Single Plane A4C EF: 55.9 %
Weight: 4084.68 [oz_av]

## 2023-02-03 LAB — BASIC METABOLIC PANEL
Anion gap: 15 (ref 5–15)
BUN: 36 mg/dL — ABNORMAL HIGH (ref 8–23)
CO2: 26 mmol/L (ref 22–32)
Calcium: 9 mg/dL (ref 8.9–10.3)
Chloride: 99 mmol/L (ref 98–111)
Creatinine, Ser: 1.11 mg/dL — ABNORMAL HIGH (ref 0.44–1.00)
GFR, Estimated: 52 mL/min — ABNORMAL LOW (ref >60)
Glucose, Bld: 201 mg/dL — ABNORMAL HIGH (ref 70–99)
Potassium: 3.8 mmol/L (ref 3.5–5.1)
Sodium: 140 mmol/L (ref 135–145)

## 2023-02-03 LAB — CBC
HCT: 35.2 % — ABNORMAL LOW (ref 36.0–46.0)
Hemoglobin: 11.1 g/dL — ABNORMAL LOW (ref 12.0–15.0)
MCH: 27.1 pg (ref 26.0–34.0)
MCHC: 31.5 g/dL (ref 30.0–36.0)
MCV: 85.9 fL (ref 80.0–100.0)
Platelets: 233 10*3/uL (ref 150–400)
RBC: 4.1 MIL/uL (ref 3.87–5.11)
RDW: 14.5 % (ref 11.5–15.5)
WBC: 8.1 10*3/uL (ref 4.0–10.5)
nRBC: 0 % (ref 0.0–0.2)

## 2023-02-03 LAB — BETA-HYDROXYBUTYRIC ACID
Beta-Hydroxybutyric Acid: 0.07 mmol/L (ref 0.05–0.27)
Beta-Hydroxybutyric Acid: 0.08 mmol/L (ref 0.05–0.27)

## 2023-02-03 MED ORDER — MUSCLE RUB 10-15 % EX CREA
TOPICAL_CREAM | CUTANEOUS | Status: DC | PRN
  Filled 2023-02-03: qty 85

## 2023-02-03 NOTE — TOC Progression Note (Signed)
Patient did get slightly short of breathe when ambulating in the hallway about 300 ft about 3/4 th's of the way.

## 2023-02-03 NOTE — Progress Notes (Signed)
Mobility Specialist - Progress Note  Pre-mobility: 88 bpm HR, 96% SpO2 During mobility: 118 bpm HR, 91% SpO2 Post-mobility: 110 bpm HR, 93% SPO2   02/03/23 1203  Mobility  Activity Ambulated with assistance in hallway  Level of Assistance Contact guard assist, steadying assist  Assistive Device Other (Comment) (HHA)  Distance Ambulated (ft) 100 ft  Range of Motion/Exercises Active Assistive  Activity Response Tolerated well  Mobility Referral Yes  $Mobility charge 1 Mobility  Mobility Specialist Start Time (ACUTE ONLY) 1155  Mobility Specialist Stop Time (ACUTE ONLY) 1203  Mobility Specialist Time Calculation (min) (ACUTE ONLY) 8 min   Pt was found in ambulating in room and agreeable to ambulate. Pt was HHA during session and had x1 standing rest break due to fatigue and felt SOB. At EOS returned to bed with all needs met. Call bell in reach.  Billey Chang Mobility Specialist

## 2023-02-03 NOTE — Progress Notes (Signed)
Report received from ongoing nurse. Agreed with nurse assessment of patient and will cont to monitor.

## 2023-02-03 NOTE — Progress Notes (Signed)
[facility]                                                                              [name], is a 77 y.o. female, DOB - [birth year], [id number] Admit date - 01/31/2023    Outpatient Primary MD for the patient is [name] Hazel, MD  LOS - 3  days  Chief Complaint  Patient presents with   Shortness of Breath       Brief summary   Patient is a 77 year old female with stage IV lung cancer, no longer on treatment, asthma, COPD, diabetes mellitus type 2, OSA, Parkinson's disease presented to ED with acute worsening of shortness of breath with minimal exertion, cough and hypoxia.  Patient reported having cough about 3 weeks ago and progressively worsened, currently productive of thick green to greenish sputum.  Not on home O2.  Found to be hypoxic with O2 sats 85% on room air.  She also reported lower extremity edema, wheezing with dyspnea and cough, 3 pillow orthopnea.  No fevers. In ED, noted to be hypoxic to 86-89% on room air, placed on 2 L, O2 sats 94%.  Creatinine 1.07, BNP 147.6  COVID PCR negative.  Chest x-ray showed cardiomegaly with increased bronchial and interstitial thickening suspicious for pulmonary edema  Assessment & Plan    Principal Problem:   Acute respiratory failure with hypoxia (HCC) secondary to COPD exacerbation, acute on chronic diastolic CHF -Continue current management, IV Lasix, bronchodilators -Improving, O2 sats 97% on room air, home O2 evaluation in a.m.  Active Problems:   Acute on chronic diastolic CHF (congestive heart failure) (HCC) -Presented with hypoxia, orthopnea, chest x-ray with pulmonary edema, elevated BNP -Continue IV Lasix 40 mg every 12 hours, still positive balance of 1.29 L, will place on fluid restriction  -2D echo pending     COPD with acute exacerbation (HCC) -Improving,  -Continue DuoNebs, Pulmicort, Brovana, flutter valve -Continue oral Zithromax    Type 2 diabetes mellitus with peripheral  neuropathy (HCC), uncontrolled with hyperglycemia CBG (last 3)  Recent Labs    02/02/23 1626 02/02/23 2213 02/03/23 0741  GLUCAP 211* 136* 158*   -CBC is now improving after discontinuing IV Solu-Medrol -Hemoglobin A1c 10.8 on 02/01/2023 -Continue current regimen of Semglee 60 units twice daily, NovoLog 10 units 3 times daily AC, SSI -Once CBGs stabilized, likely to DC on home schedule    Sleep apnea -Continue CPAP    Parkinson's disease (HCC) -Continue Sinemet    Essential hypertension -Continue Cardizem, Lasix    Anxiety and depression Continue Zoloft  History of metastatic stage IV lung CA, -Followed at [facility]    Morbid obesity Estimated body mass index is 45.22 kg/m as calculated from the following:   Height as of this encounter: 5\' 3"  (1.6 m).   Weight as of this encounter: 115.8 kg.  Code Status: Full code DVT Prophylaxis:  Lovenox  Level of Care: Level of care: Progressive Family Communication: Updated patient Disposition Plan:      Remains inpatient appropriate: Improving, possible DC home tomorrow, awaiting 2D echo, home O2 evaluation, continue IV Lasix today  Procedures:  None  Consultants:   None  Antimicrobials:  Anti-infectives (From admission, onward)    Start     Dose/Rate Route Frequency Ordered Stop   02/02/23 2000  azithromycin (ZITHROMAX) tablet 500 mg        500 mg Oral Daily 02/02/23 1138 02/05/23 1959   01/31/23 2100  cefTRIAXone (ROCEPHIN) 1 g in sodium chloride 0.9 % 100 mL IVPB  Status:  Discontinued        1 g 200 mL/hr over 30 Minutes Intravenous Every 24 hours 01/31/23 2011 02/02/23 1138          Medications  ALPRAZolam  1 mg Oral BID   arformoterol  15 mcg Nebulization BID   atorvastatin  10 mg Oral Daily   azithromycin  500 mg Oral Daily   budesonide (PULMICORT) nebulizer solution  0.25 mg Nebulization BID   carbidopa-levodopa  1 tablet Oral BID   cholecalciferol  5,000 Units Oral Daily   diltiazem  120 mg Oral  Daily   enoxaparin (LOVENOX) injection  60 mg Subcutaneous Q24H   fentaNYL  1 patch Transdermal QODAY   And   fentaNYL  1 patch Transdermal Q72H   furosemide  40 mg Intravenous BID   guaiFENesin  600 mg Oral BID   hydrocortisone  10 mg Oral QHS   hydrocortisone  15 mg Oral Daily   insulin aspart  0-20 Units Subcutaneous TID WC   insulin aspart  0-5 Units Subcutaneous QHS   insulin aspart  10 Units Subcutaneous TID WC   insulin glargine-yfgn  60 Units Subcutaneous BID   ipratropium-albuterol  3 mL Nebulization BID   pantoprazole  40 mg Oral Daily   pregabalin  75 mg Oral QHS   sertraline  100 mg Oral QHS      Subjective:   [name] was seen and examined today.  Feeling a lot better from the admission, off the O2 at rest, no wheezing.  However has not ambulated yet today and feels winded on ambulation.  No chest pain, fevers or chills, cough, nausea or vomiting. Objective:   Vitals:   02/02/23 2212 02/03/23 0138 02/03/23 0603 02/03/23 0748  BP: (!) 124/51 (!) 149/62 (!) 129/49   Pulse: 65 63 71   Resp:  16 16   Temp: 98 F (36.7 C) 98.2 F (36.8 C) 98.4 F (36.9 C)   TempSrc: Oral Oral Oral   SpO2: 92% 92% 93% 97%  Weight:   115.8 kg   Height:        Intake/Output Summary (Last 24 hours) at 02/03/2023 1021 Last data filed at 02/03/2023 0160 Gross per 24 hour  Intake 1080 ml  Output --  Net 1080 ml     Wt Readings from Last 3 Encounters:  02/03/23 115.8 kg  07/20/22 118.6 kg  05/01/22 118.1 kg   Physical Exam General: Alert and oriented x 3, NAD Cardiovascular: S1 S2 clear, RRR.  Respiratory: CTAB, no wheezing  gastrointestinal: Soft, nontender, nondistended, NBS Ext: trace pedal edema bilaterally Neuro: no new deficits Skin: No rashes Psych: Normal affect and demeanor, alert and oriented x3       Data Reviewed:  I have personally reviewed following labs    CBC Lab Results  Component Value Date   WBC 8.1 02/03/2023   RBC 4.10 02/03/2023    HGB 11.1 (L) 02/03/2023   HCT 35.2 (L) 02/03/2023   MCV 85.9 02/03/2023   MCH 27.1 02/03/2023   PLT 233 02/03/2023   MCHC 31.5 02/03/2023   RDW 14.5 02/03/2023   LYMPHSABS 1.1  08/24/2020   MONOABS 0.3 08/24/2020   EOSABS 0.1 08/24/2020   BASOSABS 0.0 08/24/2020     Last metabolic panel Lab Results  Component Value Date   NA 140 02/03/2023   K 3.8 02/03/2023   CL 99 02/03/2023   CO2 26 02/03/2023   BUN 36 (H) 02/03/2023   CREATININE 1.11 (H) 02/03/2023   GLUCOSE 201 (H) 02/03/2023   GFRNONAA 52 (L) 02/03/2023   GFRAA 78 02/20/2020   CALCIUM 9.0 02/03/2023   PHOS 4.4 08/03/2008   PROT 6.8 01/31/2023   ALBUMIN 3.2 (L) 01/31/2023   BILITOT 0.5 01/31/2023   ALKPHOS 75 01/31/2023   AST 9 (L) 01/31/2023   ALT 11 01/31/2023   ANIONGAP 15 02/03/2023    CBG (last 3)  Recent Labs    02/02/23 1626 02/02/23 2213 02/03/23 0741  GLUCAP 211* 136* 158*      Coagulation Profile: No results for input(s): "INR", "PROTIME" in the last 168 hours.   Radiology Studies: I have personally reviewed the imaging studies  No results found.     Thad Ranger M.D. [facility] 02/03/2023, 10:21 AM  Available via Epic secure chat 7am-7pm After 7 pm, please refer to night coverage provider listed on amion.

## 2023-02-03 NOTE — Progress Notes (Signed)
The pt ambulated 500 ft 97% on R/A tolerated walking in the hallway well. Plan of care ongoing.

## 2023-02-03 NOTE — Plan of Care (Signed)
  Problem: Metabolic: Goal: Ability to maintain appropriate glucose levels will improve Outcome: Progressing   Problem: Nutritional: Goal: Progress toward achieving an optimal weight will improve Outcome: Progressing   Problem: Education: Goal: Ability to demonstrate management of disease process will improve Outcome: Adequate for Discharge Goal: Ability to verbalize understanding of medication therapies will improve Outcome: Adequate for Discharge   Problem: Activity: Goal: Capacity to carry out activities will improve Outcome: Adequate for Discharge   Problem: Cardiac: Goal: Ability to achieve and maintain adequate cardiopulmonary perfusion will improve Outcome: Adequate for Discharge   Problem: Education: Goal: Ability to describe self-care measures that may prevent or decrease complications (Diabetes Survival Skills Education) will improve Outcome: Adequate for Discharge   Problem: Coping: Goal: Ability to adjust to condition or change in health will improve Outcome: Adequate for Discharge   Problem: Fluid Volume: Goal: Ability to maintain a balanced intake and output will improve Outcome: Adequate for Discharge   Problem: Health Behavior/Discharge Planning: Goal: Ability to identify and utilize available resources and services will improve Outcome: Adequate for Discharge Goal: Ability to manage health-related needs will improve Outcome: Adequate for Discharge   Problem: Metabolic: Goal: Ability to maintain appropriate glucose levels will improve Outcome: Adequate for Discharge   Problem: Nutritional: Goal: Maintenance of adequate nutrition will improve Outcome: Adequate for Discharge Goal: Progress toward achieving an optimal weight will improve Outcome: Adequate for Discharge   Problem: Skin Integrity: Goal: Risk for impaired skin integrity will decrease Outcome: Adequate for Discharge   Problem: Tissue Perfusion: Goal: Adequacy of tissue perfusion will  improve Outcome: Adequate for Discharge   Problem: Education: Goal: Knowledge of General Education information will improve Description: Including pain rating scale, medication(s)/side effects and non-pharmacologic comfort measures Outcome: Adequate for Discharge   Problem: Clinical Measurements: Goal: Will remain free from infection Outcome: Adequate for Discharge Goal: Diagnostic test results will improve Outcome: Adequate for Discharge Goal: Respiratory complications will improve Outcome: Adequate for Discharge Goal: Cardiovascular complication will be avoided Outcome: Adequate for Discharge   Problem: Activity: Goal: Risk for activity intolerance will decrease Outcome: Adequate for Discharge   Problem: Nutrition: Goal: Adequate nutrition will be maintained Outcome: Adequate for Discharge   Problem: Coping: Goal: Level of anxiety will decrease Outcome: Adequate for Discharge   Problem: Pain Managment: Goal: General experience of comfort will improve Outcome: Adequate for Discharge   Problem: Safety: Goal: Ability to remain free from injury will improve Outcome: Adequate for Discharge   Problem: Skin Integrity: Goal: Risk for impaired skin integrity will decrease Outcome: Adequate for Discharge   Problem: Coping: Goal: Ability to adjust to condition or change in health will improve Outcome: Adequate for Discharge   Problem: Fluid Volume: Goal: Ability to maintain a balanced intake and output will improve Outcome: Adequate for Discharge   Problem: Health Behavior/Discharge Planning: Goal: Ability to identify and utilize available resources and services will improve Outcome: Adequate for Discharge   Problem: Nutritional: Goal: Maintenance of adequate nutrition will improve Outcome: Adequate for Discharge   Problem: Skin Integrity: Goal: Risk for impaired skin integrity will decrease Outcome: Adequate for Discharge   Problem: Tissue Perfusion: Goal:  Adequacy of tissue perfusion will improve Outcome: Adequate for Discharge

## 2023-02-03 NOTE — Progress Notes (Signed)
  Echocardiogram 2D Echocardiogram has been performed.  [name] 02/03/2023, 11:39 AM

## 2023-02-03 NOTE — Progress Notes (Signed)
   02/03/23 2053  BiPAP/CPAP/SIPAP  BiPAP/CPAP/SIPAP Pt Type Adult (Pt prefer self placement mahcine all ready to be used.)  BiPAP/CPAP/SIPAP DREAMSTATIOND  Mask Type Nasal mask  Mask Size Medium  Respiratory Rate 19 breaths/min  EPAP 13 cmH2O  Flow Rate 2 lpm  Patient Home Equipment No  Auto Titrate No  CPAP/SIPAP surface wiped down Yes  BiPAP/CPAP /SiPAP Vitals  Pulse Rate 77  Resp 11  SpO2 95 %  Bilateral Breath Sounds Rhonchi;Expiratory wheezes  MEWS Score/Color  MEWS Score 1  MEWS Score Color Green

## 2023-02-04 DIAGNOSIS — C3490 Malignant neoplasm of unspecified part of unspecified bronchus or lung: Secondary | ICD-10-CM

## 2023-02-04 DIAGNOSIS — I10 Essential (primary) hypertension: Secondary | ICD-10-CM

## 2023-02-04 DIAGNOSIS — J449 Chronic obstructive pulmonary disease, unspecified: Secondary | ICD-10-CM

## 2023-02-04 DIAGNOSIS — E119 Type 2 diabetes mellitus without complications: Secondary | ICD-10-CM

## 2023-02-04 DIAGNOSIS — I480 Paroxysmal atrial fibrillation: Secondary | ICD-10-CM

## 2023-02-04 DIAGNOSIS — G4733 Obstructive sleep apnea (adult) (pediatric): Secondary | ICD-10-CM

## 2023-02-04 DIAGNOSIS — I3139 Other pericardial effusion (noninflammatory): Secondary | ICD-10-CM

## 2023-02-04 DIAGNOSIS — D509 Iron deficiency anemia, unspecified: Secondary | ICD-10-CM

## 2023-02-04 DIAGNOSIS — J9601 Acute respiratory failure with hypoxia: Secondary | ICD-10-CM | POA: Diagnosis not present

## 2023-02-04 LAB — BASIC METABOLIC PANEL
Anion gap: 12 (ref 5–15)
BUN: 36 mg/dL — ABNORMAL HIGH (ref 8–23)
CO2: 30 mmol/L (ref 22–32)
Calcium: 9.1 mg/dL (ref 8.9–10.3)
Chloride: 97 mmol/L — ABNORMAL LOW (ref 98–111)
Creatinine, Ser: 1.53 mg/dL — ABNORMAL HIGH (ref 0.44–1.00)
GFR, Estimated: 35 mL/min — ABNORMAL LOW (ref >60)
Glucose, Bld: 171 mg/dL — ABNORMAL HIGH (ref 70–99)
Potassium: 4.4 mmol/L (ref 3.5–5.1)
Sodium: 139 mmol/L (ref 135–145)

## 2023-02-04 LAB — GLUCOSE, CAPILLARY
Glucose-Capillary: 128 mg/dL — ABNORMAL HIGH (ref 70–99)
Glucose-Capillary: 220 mg/dL — ABNORMAL HIGH (ref 70–99)

## 2023-02-04 MED ORDER — AZITHROMYCIN 500 MG PO TABS: 500.0000 mg | ORAL_TABLET | Freq: Every day | ORAL | 0 refills | Status: AC

## 2023-02-04 MED ORDER — GUAIFENESIN-DM 100-10 MG/5ML PO SYRP: 5.0000 mL | ORAL_SOLUTION | ORAL | 0 refills | Status: DC | PRN

## 2023-02-04 MED ORDER — GUAIFENESIN ER 600 MG PO TB12: 600.0000 mg | ORAL_TABLET | Freq: Two times a day (BID) | ORAL | 0 refills | Status: DC

## 2023-02-04 MED ORDER — FUROSEMIDE 40 MG PO TABS: 40.0000 mg | ORAL_TABLET | Freq: Every day | ORAL | 3 refills | Status: AC

## 2023-02-04 MED ORDER — DILTIAZEM HCL ER COATED BEADS 120 MG PO CP24: 120.0000 mg | ORAL_CAPSULE | Freq: Every day | ORAL | 3 refills | Status: DC

## 2023-02-04 MED ORDER — FUROSEMIDE 40 MG PO TABS: 40.0000 mg | ORAL_TABLET | Freq: Every day | ORAL | Status: DC

## 2023-02-04 MED ORDER — CARBIDOPA-LEVODOPA 25-100 MG PO TABS: 1.0000 | ORAL_TABLET | Freq: Two times a day (BID) | ORAL | 4 refills | Status: AC

## 2023-02-04 NOTE — Discharge Summary (Signed)
Physician Discharge Summary   Patient: [name] MRN: [id number] DOB: 30-Oct-1945  Admit date:     01/31/2023  Discharge date: 02/04/23  Discharge Physician: [name] Ranger MD    PCP: [name] Hazel, MD   Recommendations at discharge:   Continue Lasix 40 mg daily (per patient, she had run out of Lasix for at least 3 to 4 weeks) Continue Cardizem 120 mg daily She has an appointment with cardiology on 02/26/2023. Follow BMET.  Patient needs education on using MyChart, requesting refills and scheduling follow-up appointments (she has MyChart and I did show her how to use it), she will strongly benefit from someone in the office to go over this again.   Discharge Diagnoses:    Acute respiratory failure with hypoxia (HCC)   Acute on chronic diastolic CHF (congestive heart failure) (HCC)   COPD with acute exacerbation (HCC)   Type 2 diabetes mellitus with peripheral neuropathy (HCC)   Sleep apnea   Parkinson's disease (HCC)   Essential hypertension   Anxiety and depression Acute kidney injury on CKD stage IIIa Hyponatremia   Hospital Course:  Patient is a 77 year old female with stage IV lung cancer, no longer on treatment, asthma, COPD, diabetes mellitus type 2, OSA, Parkinson's disease presented to ED with acute worsening of shortness of breath with minimal exertion, cough and hypoxia.  Patient reported having cough about 3 weeks ago and progressively worsened, currently productive of thick green to greenish sputum.  Not on home O2.  Found to be hypoxic with O2 sats 85% on room air.  She also reported lower extremity edema, wheezing with dyspnea and cough, 3 pillow orthopnea.  No fevers. In ED, noted to be hypoxic to 86-89% on room air, placed on 2 L, O2 sats 94%.  Creatinine 1.07, BNP 147.6  COVID PCR negative.  Chest x-ray showed cardiomegaly with increased bronchial and interstitial thickening suspicious for pulmonary edema   Assessment and Plan:   Acute respiratory failure  with hypoxia (HCC) secondary to COPD exacerbation, acute on chronic diastolic CHF -Patient was placed on IV Lasix for diuresis, scheduled DuoNebs, with significant improvement in her symptoms. -She is ambulating without any difficulty, home O2 evaluation completed, does not need any O2 at discharge.      Acute on chronic diastolic CHF (congestive heart failure) (HCC) -Presented with hypoxia, orthopnea, chest x-ray with pulmonary edema, elevated BNP -Patient reported that she had run out of the Lasix for at least 3 to 4 weeks and could not refill the medication as she was told that she needed follow-up appointment with her cardiologist first  -She was placed on IV Lasix 40 mg twice daily for diuresis.  Patient did well with diuresis, now on room air, ambulating without any difficulty or hypoxia. -She will resume Lasix 40 mg daily (refills were sent for Lasix and Cardizem). -Patient was counseled to keep the follow-up appointment with her cardiologist, Dr. Raynelle [name] on 02/26/2023.  -2D echo showed EF of 65 to 70%, G1 DD       COPD with acute exacerbation (HCC) -Resolved, hypoxia likely more due to CHF exacerbation than COPD  -Patient was placed on DuoNebs, Pulmicort, Brovana, Zithromax. -She will continue albuterol, Trelegy.     Type 2 diabetes mellitus with peripheral neuropathy (HCC), uncontrolled with hyperglycemia CBG (last 3)   -Patient has significant hyperglycemia when placed on IV Solu-Medrol.  CBGs has not improved significantly after discontinuing IV steroids. -Hemoglobin A1c 10.8 on 02/01/2023 -Continue insulin regimen outpatient, further titration of insulin  not require any O2 on ambulation.  BP 111/82 (BP Location: Left Arm)   Pulse 76   Temp 98.4 F (36.9 C) (Oral)   Resp 16   Ht 5\' 3"  (1.6 m)   Wt 116.4 kg   SpO2 98%   BMI 45.46 kg/m   Physical Exam General: Alert and oriented x 3, NAD Cardiovascular: S1 S2 clear, RRR.  Respiratory: CTAB, no wheezing, rales or rhonchi Gastrointestinal: Soft, nontender, nondistended, NBS Ext: no pedal edema bilaterally Neuro: no new deficits Psych: Normal affect   Condition at discharge: fair  The results of significant diagnostics from this hospitalization (including imaging, microbiology, ancillary and laboratory) are listed below for reference.   Imaging Studies: ECHOCARDIOGRAM COMPLETE  Result Date: 02/03/2023    ECHOCARDIOGRAM REPORT   Patient Name:   [name] Date of Exam: 02/03/2023 Medical Rec #:  [id number]          Height:       63.0 in Accession #:    [id number]         Weight:       255.3 lb Date of Birth:  January 18, 1946         BSA:          2.145 m Patient Age:    76 years           BP:           129/49 mmHg Patient Gender: F                  HR:           60 bpm. Exam Location:  Inpatient Procedure: 2D Echo, Cardiac Doppler and Color Doppler Indications:    CHF  History:        Patient has prior history of Echocardiogram examinations, most                 recent 09/26/2021. COPD, Signs/Symptoms:Parkinson's, Murmur,                 Dyspnea, Shortness of Breath and Edema; Risk Factors:Sleep                 Apnea, Hypertension and Diabetes. Lung CA.   Sonographer:    Milda Smart Referring Phys: [id number] [name]  Sonographer Comments: Suboptimal subcostal window. Image acquisition challenging due to patient body habitus and Image acquisition challenging due to respiratory motion. IMPRESSIONS  1. Left ventricular ejection fraction, by estimation, is 65 to 70%. The left ventricle has normal function. The left ventricle has no regional wall motion abnormalities. There is mild left ventricular hypertrophy. Left ventricular diastolic parameters are consistent with Grade I diastolic dysfunction (impaired relaxation).  2. Right ventricular systolic function is normal. The right ventricular size is normal. There is normal pulmonary artery systolic pressure. The estimated right ventricular systolic pressure is 30.5 mmHg.  3. The mitral valve is degenerative. Trivial mitral valve regurgitation. No evidence of mitral stenosis.  4. The aortic valve is abnormal. There is moderate calcification of the aortic valve. Aortic valve regurgitation is not visualized. Mild aortic valve stenosis. Aortic valve area, by VTI measures 2.16 cm. Aortic valve mean gradient measures 16.0 mmHg. Aortic valve Vmax measures 2.49 m/s.  5. The inferior vena cava is normal in size with greater than 50% respiratory variability, suggesting right atrial pressure of 3 mmHg.  6. Increased flow velocities may be secondary to anemia, thyrotoxicosis, hyperdynamic or high flow state. FINDINGS  Left Ventricle: Left ventricular  not require any O2 on ambulation.  BP 111/82 (BP Location: Left Arm)   Pulse 76   Temp 98.4 F (36.9 C) (Oral)   Resp 16   Ht 5\' 3"  (1.6 m)   Wt 116.4 kg   SpO2 98%   BMI 45.46 kg/m   Physical Exam General: Alert and oriented x 3, NAD Cardiovascular: S1 S2 clear, RRR.  Respiratory: CTAB, no wheezing, rales or rhonchi Gastrointestinal: Soft, nontender, nondistended, NBS Ext: no pedal edema bilaterally Neuro: no new deficits Psych: Normal affect   Condition at discharge: fair  The results of significant diagnostics from this hospitalization (including imaging, microbiology, ancillary and laboratory) are listed below for reference.   Imaging Studies: ECHOCARDIOGRAM COMPLETE  Result Date: 02/03/2023    ECHOCARDIOGRAM REPORT   Patient Name:   [name] Date of Exam: 02/03/2023 Medical Rec #:  [id number]          Height:       63.0 in Accession #:    [id number]         Weight:       255.3 lb Date of Birth:  January 18, 1946         BSA:          2.145 m Patient Age:    76 years           BP:           129/49 mmHg Patient Gender: F                  HR:           60 bpm. Exam Location:  Inpatient Procedure: 2D Echo, Cardiac Doppler and Color Doppler Indications:    CHF  History:        Patient has prior history of Echocardiogram examinations, most                 recent 09/26/2021. COPD, Signs/Symptoms:Parkinson's, Murmur,                 Dyspnea, Shortness of Breath and Edema; Risk Factors:Sleep                 Apnea, Hypertension and Diabetes. Lung CA.   Sonographer:    Milda Smart Referring Phys: [id number] [name]  Sonographer Comments: Suboptimal subcostal window. Image acquisition challenging due to patient body habitus and Image acquisition challenging due to respiratory motion. IMPRESSIONS  1. Left ventricular ejection fraction, by estimation, is 65 to 70%. The left ventricle has normal function. The left ventricle has no regional wall motion abnormalities. There is mild left ventricular hypertrophy. Left ventricular diastolic parameters are consistent with Grade I diastolic dysfunction (impaired relaxation).  2. Right ventricular systolic function is normal. The right ventricular size is normal. There is normal pulmonary artery systolic pressure. The estimated right ventricular systolic pressure is 30.5 mmHg.  3. The mitral valve is degenerative. Trivial mitral valve regurgitation. No evidence of mitral stenosis.  4. The aortic valve is abnormal. There is moderate calcification of the aortic valve. Aortic valve regurgitation is not visualized. Mild aortic valve stenosis. Aortic valve area, by VTI measures 2.16 cm. Aortic valve mean gradient measures 16.0 mmHg. Aortic valve Vmax measures 2.49 m/s.  5. The inferior vena cava is normal in size with greater than 50% respiratory variability, suggesting right atrial pressure of 3 mmHg.  6. Increased flow velocities may be secondary to anemia, thyrotoxicosis, hyperdynamic or high flow state. FINDINGS  Left Ventricle: Left ventricular  Physician Discharge Summary   Patient: [name] MRN: [id number] DOB: 30-Oct-1945  Admit date:     01/31/2023  Discharge date: 02/04/23  Discharge Physician: [name] Ranger MD    PCP: [name] Hazel, MD   Recommendations at discharge:   Continue Lasix 40 mg daily (per patient, she had run out of Lasix for at least 3 to 4 weeks) Continue Cardizem 120 mg daily She has an appointment with cardiology on 02/26/2023. Follow BMET.  Patient needs education on using MyChart, requesting refills and scheduling follow-up appointments (she has MyChart and I did show her how to use it), she will strongly benefit from someone in the office to go over this again.   Discharge Diagnoses:    Acute respiratory failure with hypoxia (HCC)   Acute on chronic diastolic CHF (congestive heart failure) (HCC)   COPD with acute exacerbation (HCC)   Type 2 diabetes mellitus with peripheral neuropathy (HCC)   Sleep apnea   Parkinson's disease (HCC)   Essential hypertension   Anxiety and depression Acute kidney injury on CKD stage IIIa Hyponatremia   Hospital Course:  Patient is a 77 year old female with stage IV lung cancer, no longer on treatment, asthma, COPD, diabetes mellitus type 2, OSA, Parkinson's disease presented to ED with acute worsening of shortness of breath with minimal exertion, cough and hypoxia.  Patient reported having cough about 3 weeks ago and progressively worsened, currently productive of thick green to greenish sputum.  Not on home O2.  Found to be hypoxic with O2 sats 85% on room air.  She also reported lower extremity edema, wheezing with dyspnea and cough, 3 pillow orthopnea.  No fevers. In ED, noted to be hypoxic to 86-89% on room air, placed on 2 L, O2 sats 94%.  Creatinine 1.07, BNP 147.6  COVID PCR negative.  Chest x-ray showed cardiomegaly with increased bronchial and interstitial thickening suspicious for pulmonary edema   Assessment and Plan:   Acute respiratory failure  with hypoxia (HCC) secondary to COPD exacerbation, acute on chronic diastolic CHF -Patient was placed on IV Lasix for diuresis, scheduled DuoNebs, with significant improvement in her symptoms. -She is ambulating without any difficulty, home O2 evaluation completed, does not need any O2 at discharge.      Acute on chronic diastolic CHF (congestive heart failure) (HCC) -Presented with hypoxia, orthopnea, chest x-ray with pulmonary edema, elevated BNP -Patient reported that she had run out of the Lasix for at least 3 to 4 weeks and could not refill the medication as she was told that she needed follow-up appointment with her cardiologist first  -She was placed on IV Lasix 40 mg twice daily for diuresis.  Patient did well with diuresis, now on room air, ambulating without any difficulty or hypoxia. -She will resume Lasix 40 mg daily (refills were sent for Lasix and Cardizem). -Patient was counseled to keep the follow-up appointment with her cardiologist, Dr. Raynelle [name] on 02/26/2023.  -2D echo showed EF of 65 to 70%, G1 DD       COPD with acute exacerbation (HCC) -Resolved, hypoxia likely more due to CHF exacerbation than COPD  -Patient was placed on DuoNebs, Pulmicort, Brovana, Zithromax. -She will continue albuterol, Trelegy.     Type 2 diabetes mellitus with peripheral neuropathy (HCC), uncontrolled with hyperglycemia CBG (last 3)   -Patient has significant hyperglycemia when placed on IV Solu-Medrol.  CBGs has not improved significantly after discontinuing IV steroids. -Hemoglobin A1c 10.8 on 02/01/2023 -Continue insulin regimen outpatient, further titration of insulin  Physician Discharge Summary   Patient: [name] MRN: [id number] DOB: 30-Oct-1945  Admit date:     01/31/2023  Discharge date: 02/04/23  Discharge Physician: [name] Ranger MD    PCP: [name] Hazel, MD   Recommendations at discharge:   Continue Lasix 40 mg daily (per patient, she had run out of Lasix for at least 3 to 4 weeks) Continue Cardizem 120 mg daily She has an appointment with cardiology on 02/26/2023. Follow BMET.  Patient needs education on using MyChart, requesting refills and scheduling follow-up appointments (she has MyChart and I did show her how to use it), she will strongly benefit from someone in the office to go over this again.   Discharge Diagnoses:    Acute respiratory failure with hypoxia (HCC)   Acute on chronic diastolic CHF (congestive heart failure) (HCC)   COPD with acute exacerbation (HCC)   Type 2 diabetes mellitus with peripheral neuropathy (HCC)   Sleep apnea   Parkinson's disease (HCC)   Essential hypertension   Anxiety and depression Acute kidney injury on CKD stage IIIa Hyponatremia   Hospital Course:  Patient is a 77 year old female with stage IV lung cancer, no longer on treatment, asthma, COPD, diabetes mellitus type 2, OSA, Parkinson's disease presented to ED with acute worsening of shortness of breath with minimal exertion, cough and hypoxia.  Patient reported having cough about 3 weeks ago and progressively worsened, currently productive of thick green to greenish sputum.  Not on home O2.  Found to be hypoxic with O2 sats 85% on room air.  She also reported lower extremity edema, wheezing with dyspnea and cough, 3 pillow orthopnea.  No fevers. In ED, noted to be hypoxic to 86-89% on room air, placed on 2 L, O2 sats 94%.  Creatinine 1.07, BNP 147.6  COVID PCR negative.  Chest x-ray showed cardiomegaly with increased bronchial and interstitial thickening suspicious for pulmonary edema   Assessment and Plan:   Acute respiratory failure  with hypoxia (HCC) secondary to COPD exacerbation, acute on chronic diastolic CHF -Patient was placed on IV Lasix for diuresis, scheduled DuoNebs, with significant improvement in her symptoms. -She is ambulating without any difficulty, home O2 evaluation completed, does not need any O2 at discharge.      Acute on chronic diastolic CHF (congestive heart failure) (HCC) -Presented with hypoxia, orthopnea, chest x-ray with pulmonary edema, elevated BNP -Patient reported that she had run out of the Lasix for at least 3 to 4 weeks and could not refill the medication as she was told that she needed follow-up appointment with her cardiologist first  -She was placed on IV Lasix 40 mg twice daily for diuresis.  Patient did well with diuresis, now on room air, ambulating without any difficulty or hypoxia. -She will resume Lasix 40 mg daily (refills were sent for Lasix and Cardizem). -Patient was counseled to keep the follow-up appointment with her cardiologist, Dr. Raynelle [name] on 02/26/2023.  -2D echo showed EF of 65 to 70%, G1 DD       COPD with acute exacerbation (HCC) -Resolved, hypoxia likely more due to CHF exacerbation than COPD  -Patient was placed on DuoNebs, Pulmicort, Brovana, Zithromax. -She will continue albuterol, Trelegy.     Type 2 diabetes mellitus with peripheral neuropathy (HCC), uncontrolled with hyperglycemia CBG (last 3)   -Patient has significant hyperglycemia when placed on IV Solu-Medrol.  CBGs has not improved significantly after discontinuing IV steroids. -Hemoglobin A1c 10.8 on 02/01/2023 -Continue insulin regimen outpatient, further titration of insulin  Physician Discharge Summary   Patient: [name] MRN: [id number] DOB: 30-Oct-1945  Admit date:     01/31/2023  Discharge date: 02/04/23  Discharge Physician: [name] Ranger MD    PCP: [name] Hazel, MD   Recommendations at discharge:   Continue Lasix 40 mg daily (per patient, she had run out of Lasix for at least 3 to 4 weeks) Continue Cardizem 120 mg daily She has an appointment with cardiology on 02/26/2023. Follow BMET.  Patient needs education on using MyChart, requesting refills and scheduling follow-up appointments (she has MyChart and I did show her how to use it), she will strongly benefit from someone in the office to go over this again.   Discharge Diagnoses:    Acute respiratory failure with hypoxia (HCC)   Acute on chronic diastolic CHF (congestive heart failure) (HCC)   COPD with acute exacerbation (HCC)   Type 2 diabetes mellitus with peripheral neuropathy (HCC)   Sleep apnea   Parkinson's disease (HCC)   Essential hypertension   Anxiety and depression Acute kidney injury on CKD stage IIIa Hyponatremia   Hospital Course:  Patient is a 77 year old female with stage IV lung cancer, no longer on treatment, asthma, COPD, diabetes mellitus type 2, OSA, Parkinson's disease presented to ED with acute worsening of shortness of breath with minimal exertion, cough and hypoxia.  Patient reported having cough about 3 weeks ago and progressively worsened, currently productive of thick green to greenish sputum.  Not on home O2.  Found to be hypoxic with O2 sats 85% on room air.  She also reported lower extremity edema, wheezing with dyspnea and cough, 3 pillow orthopnea.  No fevers. In ED, noted to be hypoxic to 86-89% on room air, placed on 2 L, O2 sats 94%.  Creatinine 1.07, BNP 147.6  COVID PCR negative.  Chest x-ray showed cardiomegaly with increased bronchial and interstitial thickening suspicious for pulmonary edema   Assessment and Plan:   Acute respiratory failure  with hypoxia (HCC) secondary to COPD exacerbation, acute on chronic diastolic CHF -Patient was placed on IV Lasix for diuresis, scheduled DuoNebs, with significant improvement in her symptoms. -She is ambulating without any difficulty, home O2 evaluation completed, does not need any O2 at discharge.      Acute on chronic diastolic CHF (congestive heart failure) (HCC) -Presented with hypoxia, orthopnea, chest x-ray with pulmonary edema, elevated BNP -Patient reported that she had run out of the Lasix for at least 3 to 4 weeks and could not refill the medication as she was told that she needed follow-up appointment with her cardiologist first  -She was placed on IV Lasix 40 mg twice daily for diuresis.  Patient did well with diuresis, now on room air, ambulating without any difficulty or hypoxia. -She will resume Lasix 40 mg daily (refills were sent for Lasix and Cardizem). -Patient was counseled to keep the follow-up appointment with her cardiologist, Dr. Raynelle [name] on 02/26/2023.  -2D echo showed EF of 65 to 70%, G1 DD       COPD with acute exacerbation (HCC) -Resolved, hypoxia likely more due to CHF exacerbation than COPD  -Patient was placed on DuoNebs, Pulmicort, Brovana, Zithromax. -She will continue albuterol, Trelegy.     Type 2 diabetes mellitus with peripheral neuropathy (HCC), uncontrolled with hyperglycemia CBG (last 3)   -Patient has significant hyperglycemia when placed on IV Solu-Medrol.  CBGs has not improved significantly after discontinuing IV steroids. -Hemoglobin A1c 10.8 on 02/01/2023 -Continue insulin regimen outpatient, further titration of insulin  not require any O2 on ambulation.  BP 111/82 (BP Location: Left Arm)   Pulse 76   Temp 98.4 F (36.9 C) (Oral)   Resp 16   Ht 5\' 3"  (1.6 m)   Wt 116.4 kg   SpO2 98%   BMI 45.46 kg/m   Physical Exam General: Alert and oriented x 3, NAD Cardiovascular: S1 S2 clear, RRR.  Respiratory: CTAB, no wheezing, rales or rhonchi Gastrointestinal: Soft, nontender, nondistended, NBS Ext: no pedal edema bilaterally Neuro: no new deficits Psych: Normal affect   Condition at discharge: fair  The results of significant diagnostics from this hospitalization (including imaging, microbiology, ancillary and laboratory) are listed below for reference.   Imaging Studies: ECHOCARDIOGRAM COMPLETE  Result Date: 02/03/2023    ECHOCARDIOGRAM REPORT   Patient Name:   [name] Date of Exam: 02/03/2023 Medical Rec #:  [id number]          Height:       63.0 in Accession #:    [id number]         Weight:       255.3 lb Date of Birth:  January 18, 1946         BSA:          2.145 m Patient Age:    76 years           BP:           129/49 mmHg Patient Gender: F                  HR:           60 bpm. Exam Location:  Inpatient Procedure: 2D Echo, Cardiac Doppler and Color Doppler Indications:    CHF  History:        Patient has prior history of Echocardiogram examinations, most                 recent 09/26/2021. COPD, Signs/Symptoms:Parkinson's, Murmur,                 Dyspnea, Shortness of Breath and Edema; Risk Factors:Sleep                 Apnea, Hypertension and Diabetes. Lung CA.   Sonographer:    Milda Smart Referring Phys: [id number] [name]  Sonographer Comments: Suboptimal subcostal window. Image acquisition challenging due to patient body habitus and Image acquisition challenging due to respiratory motion. IMPRESSIONS  1. Left ventricular ejection fraction, by estimation, is 65 to 70%. The left ventricle has normal function. The left ventricle has no regional wall motion abnormalities. There is mild left ventricular hypertrophy. Left ventricular diastolic parameters are consistent with Grade I diastolic dysfunction (impaired relaxation).  2. Right ventricular systolic function is normal. The right ventricular size is normal. There is normal pulmonary artery systolic pressure. The estimated right ventricular systolic pressure is 30.5 mmHg.  3. The mitral valve is degenerative. Trivial mitral valve regurgitation. No evidence of mitral stenosis.  4. The aortic valve is abnormal. There is moderate calcification of the aortic valve. Aortic valve regurgitation is not visualized. Mild aortic valve stenosis. Aortic valve area, by VTI measures 2.16 cm. Aortic valve mean gradient measures 16.0 mmHg. Aortic valve Vmax measures 2.49 m/s.  5. The inferior vena cava is normal in size with greater than 50% respiratory variability, suggesting right atrial pressure of 3 mmHg.  6. Increased flow velocities may be secondary to anemia, thyrotoxicosis, hyperdynamic or high flow state. FINDINGS  Left Ventricle: Left ventricular

## 2023-02-04 NOTE — Evaluation (Signed)
Occupational Therapy Evaluation Patient Details Name: [name] MRN: [id number] DOB: 12-Jul-1945 Today's Date: 02/04/2023   History of Present Illness 77 year old female  presented to ED with acute worsening of shortness of breath with minimal exertion, cough and hypoxia; admitted with CHF exacerbation and COPD exacerbation. PMH: stage IV lung cancer, no longer on treatment, asthma, COPD, diabetes mellitus type 2, OSA, Parkinson's disease   Clinical Impression   Patient evaluated by Occupational Therapy with no further acute OT needs identified. All education has been completed and the patient has no further questions. Patient is MI for ADLs with h/o getting help for LB dressing from son at home unless slide on shoes.  See below for any follow-up Occupational Therapy or equipment needs. OT is signing off. Thank you for this referral.        If plan is discharge home, recommend the following: Assistance with cooking/housework;Direct supervision/assist for medications management;Assist for transportation;Direct supervision/assist for financial management;Help with stairs or ramp for entrance    Functional Status Assessment  Patient has not had a recent decline in their functional status  Equipment Recommendations  None recommended by OT       Precautions / Restrictions Precautions Precautions: Fall Restrictions Weight Bearing Restrictions: No      Mobility Bed Mobility Overal bed mobility: Modified Independent                  Transfers Overall transfer level: Needs assistance   Transfers: Sit to/from Stand Sit to Stand: Supervision                  Balance Overall balance assessment: Mild deficits observed, not formally tested         ADL either performed or assessed with clinical judgement   ADL Overall ADL's : Modified independent         General ADL Comments: patient is able to complete ADLs herself in room. patient completed toileting,  functional mobility, bed mobility and simulated ADl tasks with MI in room. patient noted to furntinure walk with patient reporting she does that " to be safe" at home. patient was educated on concerns over furniture walking. patient verbalized understanding. patient was educated on importance of taking it slow at  home. patient verbalized understanding. patient wears slide on shoes at home or has son help with socks/shoes if needed. patient was educated on ECT. patient verbalized and demonstrated understanding.      Pertinent Vitals/Pain Pain Assessment Pain Assessment: No/denies pain     Extremity/Trunk Assessment Upper Extremity Assessment Upper Extremity Assessment: Overall WFL for tasks assessed;Right hand dominant;LUE deficits/detail LUE Deficits / Details: "gives me problems"           Communication Communication Communication: No apparent difficulties   Cognition Arousal: Alert Behavior During Therapy: WFL for tasks assessed/performed Overall Cognitive Status: Within Functional Limits for tasks assessed                  Home Living Family/patient expects to be discharged to:: Private residence Living Arrangements: Children Available Help at Discharge: Family;Available PRN/intermittently Type of Home: House Home Access: Stairs to enter [employer] of Steps: 4-5 Entrance Stairs-Rails: Left;Right Home Layout: One level     Bathroom Shower/Tub: Tub/shower unit         Home Equipment: Cane - single point          Prior Functioning/Environment Prior Level of Function : Independent/Modified Independent  ADLs Comments: uses cane some when unsteady at home. typically furniture walks at home, son helps with LB dressing                 OT Goals(Current goals can be found in the care plan section) Acute Rehab OT Goals Patient Stated Goal: to go home today OT Goal Formulation: All assessment and education complete, DC therapy   OT Frequency:         AM-PAC OT "6 Clicks" Daily Activity     Outcome Measure Help from another person eating meals?: None Help from another person taking care of personal grooming?: None Help from another person toileting, which includes using toliet, bedpan, or urinal?: None Help from another person bathing (including washing, rinsing, drying)?: None Help from another person to put on and taking off regular upper body clothing?: None Help from another person to put on and taking off regular lower body clothing?: A Little 6 Click Score: 23   End of Session Nurse Communication: Mobility status  Activity Tolerance: Patient tolerated treatment well Patient left: in bed;with call bell/phone within reach  OT Visit Diagnosis: Unsteadiness on feet (R26.81)                Time: 1040-1052 OT Time Calculation (min): 12 min Charges:  OT General Charges $OT Visit: 1 Visit OT Evaluation $OT Eval Low Complexity: 1 Low  Zared Knoth OTR/L, MS Acute Rehabilitation Department Office# [phone]   Selinda Flavin 02/04/2023, 10:54 AM

## 2023-02-04 NOTE — Progress Notes (Signed)
Ambulated  on RA without issues.

## 2023-02-04 NOTE — Progress Notes (Signed)
OT Cancellation Note  Patient Details Name: [name] MRN: [id number] DOB: Apr 17, 1946   Cancelled Treatment:    Reason Eval/Treat Not Completed: Other (comment) Patient's nurse asking for therapy to check on her after breakfast. OT to continue to follow and check back as schedule will allow.  Rosalio Loud, MS Acute Rehabilitation Department Office# [phone]  02/04/2023

## 2023-02-04 NOTE — TOC Transition Note (Signed)
Transition of Care [facility]) - CM/SW Discharge Note   Patient Details  Name: [name] MRN: [id number] Date of Birth: May 04, 1945  [facility] CM/SW Contact:  Adrian Prows, RN Phone Number: 02/04/2023, 10:15 AM   Clinical Narrative:    D/C orders received; no TOC needs.   Final next level of care: Home/Self Care Barriers to Discharge: No Barriers Identified   Patient Goals and CMS Choice CMS Medicare.gov Compare Post Acute Care list provided to:: Patient Represenative (must comment) Choice offered to / list presented to : Adult Children  Discharge Placement                         Discharge Plan and Services Additional resources added to the After Visit Summary for                                       Social Determinants of Health (SDOH) Interventions SDOH Screenings   Food Insecurity: No Food Insecurity (02/01/2023)  Housing: Low Risk  (02/01/2023)  Transportation Needs: No Transportation Needs (02/01/2023)  Utilities: Not At Risk (02/01/2023)  Tobacco Use: Medium Risk (01/31/2023)     Readmission Risk Interventions    02/02/2023    3:48 PM  Readmission Risk Prevention Plan  Transportation Screening Complete  PCP or Specialist Appt within 3-5 Days Complete  HRI or Home Care Consult Complete  Social Work Consult for Recovery Care Planning/Counseling Complete  Palliative Care Screening Not Applicable  Medication Review Oceanographer) Complete

## 2023-02-04 NOTE — Evaluation (Signed)
Physical Therapy Brief Evaluation and Discharge Note Patient Details Name: [name] MRN: [id number] DOB: July 28, 1945 Today's Date: 02/04/2023   History of Present Illness  77 year old female  presented to ED with acute worsening of shortness of breath with minimal exertion, cough and hypoxia; admitted with CHF exacerbation and COPD exacerbation. PMH: stage IV lung cancer, no longer on treatment, asthma, COPD, diabetes mellitus type 2, OSA, Parkinson's disease  Clinical Impression  Patient evaluated by Physical Therapy with no further acute PT needs identified. All education has been completed and the patient has no further questions.  Pt reports independence at baseline, uses cane occasionally; son is supportive and will assist as needed per pt;  pt able to amb 160' total, 7' without using rail, no overt LOB; fatigues but denies significant DOE. HR 140 max, SpO2=97-98% on RA throughout session; HR returned to 120 to 100s with therapeutic seated rest Discussed activity progression at home, does not appear pt will need f/u at this time.   See below for any follow-up Physical Therapy or equipment needs. PT is signing off. Thank you for this referral.        PT Assessment Patient does not need any further PT services  Assistance Needed at Discharge  Intermittent Supervision/Assistance    Equipment Recommendations None recommended by PT  Recommendations for Other Services       Precautions/Restrictions Precautions Precautions: Fall Restrictions Weight Bearing Restrictions: No        Mobility  Bed Mobility   Supine/Sidelying to sit: Modified independent (Device/Increased time), Used rails   General bed mobility comments: HOB flat, no physical assist  Transfers Overall transfer level: Needs assistance Equipment used: None Transfers: Sit to/from Stand Sit to Stand: Supervision           General transfer comment: no assist, incr time     Ambulation/Gait Ambulation/Gait assistance: Contact guard assist, Supervision Gait Distance (Feet): 160 Feet Assistive device: None Gait Pattern/deviations: Step-through pattern, Decreased stride length Gait Speed: Below normal General Gait Details: occaisonal rail touch, discussed this is equivalent to using cane at home; pt able to amb 80' without using rail, no overt LOB; fatigues but denies significant DOE. HR 140 max, SpO2=97-98% on RA throughout session; HR returned to 120 to 100s with therapeutic seated rest  Home Activity Instructions    Stairs            Modified Rankin (Stroke Patients Only)        Balance Overall balance assessment: Mild deficits observed, not formally tested                        Pertinent Vitals/Pain   Pain Assessment Pain Assessment: No/denies pain     Home Living Family/patient expects to be discharged to:: Private residence Living Arrangements: Children Available Help at Discharge: Family Home Environment: Stairs to enter  Landscape architect of Steps: 5 Home Equipment: Cane - single point        Prior Function Level of Independence: Independent with assistive device(s) Comments: amb with cane longer distances, endorses furniture surfing at times, typically no device in house; reports her home is small and she does not walk long distances inside    UE/LE Assessment   UE ROM/Strength/Tone/Coordination:  (defer to OT)    LE ROM/Strength/Tone/Coordination: [facility]      Communication   Communication Communication: No apparent difficulties     Cognition Overall Cognitive Status: Appears within functional limits for tasks assessed/performed  General Comments      Exercises     Assessment/Plan    PT Problem List         PT Visit Diagnosis Other abnormalities of gait and mobility (R26.89)    No Skilled PT Patient will have necessary level of assist by caregiver at discharge;Patient is supervision for all  activity/mobility   Co-evaluation                AMPAC 6 Clicks Help needed turning from your back to your side while in a flat bed without using bedrails?: None Help needed moving from lying on your back to sitting on the side of a flat bed without using bedrails?: None Help needed moving to and from a bed to a chair (including a wheelchair)?: None Help needed standing up from a chair using your arms (e.g., wheelchair or bedside chair)?: None Help needed to walk in hospital room?: None Help needed climbing 3-5 steps with a railing? : A Little 6 Click Score: 23      End of Session Equipment Utilized During Treatment: Gait belt Activity Tolerance: Patient tolerated treatment well Patient left: in bed;with call bell/phone within reach;with bed alarm set Nurse Communication: Mobility status PT Visit Diagnosis: Other abnormalities of gait and mobility (R26.89)     Time: 1610-9604 PT Time Calculation (min) (ACUTE ONLY): 13 min  Charges:   PT Evaluation $PT Eval Low Complexity: 1 Low      Gwendolin Briel, PT  Acute Rehab Dept ([facility]) [phone]  02/04/2023   Dr [facility]  02/04/2023, 10:13 AM

## 2023-02-04 NOTE — Progress Notes (Signed)
Patient discharged: Home with family  Via: Wheelchair   Discharge paperwork given: to patient and family  Reviewed with teach back  IV and telemetry disconnected  Belongings given to patient

## 2023-02-07 DIAGNOSIS — E1122 Type 2 diabetes mellitus with diabetic chronic kidney disease: Secondary | ICD-10-CM | POA: Diagnosis not present

## 2023-02-07 DIAGNOSIS — N1831 Chronic kidney disease, stage 3a: Secondary | ICD-10-CM | POA: Diagnosis not present

## 2023-02-07 DIAGNOSIS — E2740 Unspecified adrenocortical insufficiency: Secondary | ICD-10-CM | POA: Diagnosis not present

## 2023-02-07 DIAGNOSIS — J449 Chronic obstructive pulmonary disease, unspecified: Secondary | ICD-10-CM | POA: Diagnosis not present

## 2023-02-07 DIAGNOSIS — I5032 Chronic diastolic (congestive) heart failure: Secondary | ICD-10-CM | POA: Diagnosis not present

## 2023-02-07 DIAGNOSIS — E1165 Type 2 diabetes mellitus with hyperglycemia: Secondary | ICD-10-CM | POA: Diagnosis not present

## 2023-02-07 DIAGNOSIS — Z09 Encounter for follow-up examination after completed treatment for conditions other than malignant neoplasm: Secondary | ICD-10-CM | POA: Diagnosis not present

## 2023-02-26 ENCOUNTER — Encounter: Payer: Self-pay | Admitting: Internal Medicine

## 2023-02-26 ENCOUNTER — Ambulatory Visit: Payer: Medicare Other | Attending: Internal Medicine | Admitting: Internal Medicine

## 2023-02-26 ENCOUNTER — Ambulatory Visit: Payer: Medicare Other | Attending: Internal Medicine

## 2023-02-26 VITALS — BP 120/58 | HR 62 | Ht 63.0 in | Wt 254.0 lb

## 2023-02-26 DIAGNOSIS — C3490 Malignant neoplasm of unspecified part of unspecified bronchus or lung: Secondary | ICD-10-CM

## 2023-02-26 DIAGNOSIS — E119 Type 2 diabetes mellitus without complications: Secondary | ICD-10-CM

## 2023-02-26 DIAGNOSIS — I480 Paroxysmal atrial fibrillation: Secondary | ICD-10-CM

## 2023-02-26 DIAGNOSIS — J449 Chronic obstructive pulmonary disease, unspecified: Secondary | ICD-10-CM

## 2023-02-26 DIAGNOSIS — I5033 Acute on chronic diastolic (congestive) heart failure: Secondary | ICD-10-CM

## 2023-02-26 DIAGNOSIS — G4733 Obstructive sleep apnea (adult) (pediatric): Secondary | ICD-10-CM

## 2023-02-26 DIAGNOSIS — I350 Nonrheumatic aortic (valve) stenosis: Secondary | ICD-10-CM | POA: Diagnosis not present

## 2023-02-26 DIAGNOSIS — I10 Essential (primary) hypertension: Secondary | ICD-10-CM

## 2023-02-26 NOTE — Progress Notes (Unsigned)
Enrolled for [employer] to mail a ZIO XT long term holter monitor to the patients address on file.  

## 2023-02-26 NOTE — Progress Notes (Signed)
Cardiology Office Note:    Date:  02/26/2023   ID:  [name], DOB 02-Nov-1945, MRN [id number]  PCP:  [name], MD   [facility] Providers Cardiologist:  Christell Constant, MD     Referring MD: [name], MD   CC: CAD follow up  History of Present Illness:    [name] is a 77 y.o. female with a hx of HTN with DM, HLD with DM, CAC and Aortic atherosclerosis, IDA, Morbid Obesity, OSA on COPD previously need home O2, NSCLS s/p Palliative radiation in 2018, prior Ipilumumab & Nivolumab Last in 2019 at [facility], prior malignant tamponade 2018, who established in 2021 for PAF.  Started on [facility] and was lost to follow up. 2023: Had CP and SOB that coincided with running out of her COPD therapy  Discussed the use of AI scribe software for clinical note transcription with the patient, who gave verbal consent to proceed.  [name], a 77 year old individual with a complex medical history including coronary artery calcifications, aortic atherosclerosis, morbid obesity, obstructive sleep apnea requiring home oxygen, COPD, and non-small cell lung cancer treated with radiation in 2018, presents with concerns of worsening shortness of breath and chest pain.Despite resuming her medications, in 2023 she reports persistent shortness of breath and inability to walk far without becoming winded. She was hospitalized from October 9th to 13th, during which she was treated with Lasix for fluid overload and diagnosed with congestive heart failure. She reports feeling stronger after the hospitalization, but her symptoms have started to recur.  She also reports experiencing chest pain at night. She is currently on aspirin, diltiazem, and Sinemet, but reports that she is not on a blood thinner, despite previous recommendations. She also reports a worsening tremor since her last visit.  She has a history of hyperlipidemia and aortic atherosclerosis, and there are concerns about potential  interactions between her current medications. She has a history of stage 4 lung cancer and super morbid obesity, which may be contributing to her symptoms of shortness of breath. She is followed at [facility] for this.    Past Medical History:  Diagnosis Date   Anxiety    Arthritis    generalized   Asthma    Bleeding nose    Cancer (HCC) 2017   Lung    COPD (chronic obstructive pulmonary disease) (HCC)    Diabetes mellitus    Diverticulitis    Dyspnea    Dysrhythmia    paraxysmol atrial fibrillation    H. pylori infection    Heart murmur    mild AS (03/16/20 echo)   Hypertension    Liver disease    Lung cancer (HCC)    stage 4 , with mets to bone.   Lung nodule 2017   Both lungs-pt at [facility].    Neuromuscular disorder (HCC)    bilateral feet   Parkinson disease (HCC) 2010   Sleep apnea    Tubular adenoma of colon 05/2007    Past Surgical History:  Procedure Laterality Date   APPENDECTOMY     BREAST SURGERY     implants at age [age] (removed 5 years later)   CHOLECYSTECTOMY     COLON SURGERY     colectomy and reversal after diverticular ds   PARTIAL COLECTOMY     PERICARDIAL WINDOW N/A 02/28/2017   Procedure: PERICARDIAL WINDOW;  Surgeon: Alleen Borne, MD;  Location: [facility] OR;  Service: Thoracic;  Laterality: N/A;   TONSILLECTOMY  TOTAL KNEE ARTHROPLASTY     right   VAGINAL HYSTERECTOMY      Current Medications: Current Meds  Medication Sig   albuterol (PROVENTIL) (2.5 MG/3ML) 0.083% nebulizer solution Take 3 mLs (2.5 mg total) by nebulization every 6 (six) hours as needed for wheezing or shortness of breath.   albuterol (VENTOLIN HFA) 108 (90 Base) MCG/ACT inhaler Inhale 2 puffs into the lungs every 6 (six) hours as needed for wheezing or shortness of breath.   ALPRAZolam (XANAX) 1 MG tablet Take 1 tablet (1 mg total) 2 (two) times daily as needed by mouth for anxiety.   aspirin 81 MG chewable tablet Chew 81-162 mg by mouth as needed (for chest pain).    atorvastatin (LIPITOR) 10 MG tablet Take 10 mg by mouth daily.   carbidopa-levodopa (SINEMET IR) 25-100 MG tablet Take 1 tablet by mouth in the morning and at bedtime.   Cholecalciferol (VITAMIN D3) 125 MCG (5000 UT) CAPS Take 5,000 Units by mouth daily.   diltiazem (CARDIZEM CD) 120 MG 24 hr capsule Take 1 capsule (120 mg total) by mouth daily.   fentaNYL (DURAGESIC - DOSED MCG/HR) 100 MCG/HR Place 100 mcg onto the skin every other day.   fentaNYL (DURAGESIC) 25 MCG/HR Place 1 patch onto the skin every other day.   fluticasone (FLONASE) 50 MCG/ACT nasal spray Place 1-2 sprays into both nostrils daily as needed for allergies or rhinitis.   furosemide (LASIX) 40 MG tablet Take 1 tablet (40 mg total) by mouth daily.   HUMALOG KWIKPEN 100 UNIT/ML KiwkPen Inject 0.12 mLs (12 Units total) into the skin 3 (three) times daily before meals. Along with your normal sliding scale. (Patient taking differently: Inject 3-4 Units into the skin daily as needed (for a BGL greater than 300).)   hydrocortisone (CORTEF) 5 MG tablet Take 10-15 mg by mouth See admin instructions. Take 15 mg by mouth in the morning and 10 mg at bedtime   insulin glargine (LANTUS) 100 UNIT/ML injection Inject 40-60 Units into the skin See admin instructions. Inject 60 units into the skin in the morning and 40 units at bedtime   nitroGLYCERIN (NITROSTAT) 0.4 MG SL tablet PLACE AND DISSOLVE ONE TABLET UNDER TONGUE AS NEEDED FOR CHEST PAIN( EVERY 5 MINUTES, MAY TAKE UP TO 3 TIMES DAILY)   omeprazole (PRILOSEC) 20 MG capsule Take 20 mg by mouth daily as needed (acid reflux).   ondansetron (ZOFRAN) 8 MG tablet Take 8 mg by mouth every 8 (eight) hours as needed for nausea or vomiting.   Oxycodone HCl 20 MG TABS Take 20 mg by mouth every 4 (four) hours as needed (for pain).   pregabalin (LYRICA) 50 MG capsule Take 50 mg by mouth in the morning.   pregabalin (LYRICA) 75 MG capsule Take 75 mg by mouth at bedtime.   PRESCRIPTION MEDICATION CPAP- At  bedtime   promethazine (PHENERGAN) 12.5 MG tablet Take 12.5 mg by mouth every 6 (six) hours as needed for nausea or vomiting.   sertraline (ZOLOFT) 100 MG tablet Take 100 mg by mouth daily.   SYNJARDY XR 01-999 MG TB24 Take 1 tablet by mouth every morning.   TRELEGY ELLIPTA 100-62.5-25 MCG/INH AEPB Take 1 puff by mouth daily.     Allergies:   Ozempic (0.25 or 0.5 mg-dose) [semaglutide(0.25 or 0.5mg -dos)], Morphine and codeine, and Tape   Social History   Socioeconomic History   Marital status: Single    Spouse name: Not on file   Number of children: 5  Years of education: Not on file   Highest education level: Not on file  Occupational History   Occupation: retired    Comment: Designer, fashion/clothing; [employer]  Tobacco Use   Smoking status: Former    Current packs/day: 0.00    Average packs/day: 1 pack/day for 28.0 years (28.0 ttl pk-yrs)    Types: Cigarettes    Start date: 03/20/1959    Quit date: 03/20/1987    Years since quitting: 35.9   Smokeless tobacco: Never  Vaping Use   Vaping status: Never Used  Substance and Sexual Activity   Alcohol use: Yes    Comment: 2 margaritas a year   Drug use: No   Sexual activity: Never  Other Topics Concern   Not on file  Social History Narrative   Caffiene 16 oz coffee every am   Retired:  [employer]   5 kids.     Social Determinants of Health   Financial Resource Strain: Not on file  Food Insecurity: No Food Insecurity (02/01/2023)   Hunger Vital Sign    Worried About Running Out of Food in the Last Year: Never true    Ran Out of Food in the Last Year: Never true  Transportation Needs: No Transportation Needs (02/01/2023)   PRAPARE - Administrator, Civil Service (Medical): No    Lack of Transportation (Non-Medical): No  Physical Activity: Not on file  Stress: Not on file  Social Connections: Not on file  Occasionally comes with sister (from [place])  Family History: The patient's family history includes  Cancer - Lung in her brother and mother; Diabetes Mellitus II in her sister and son. There is no history of Colon cancer.  ROS:   Please see the history of present illness.     EKGs/Labs/Other Studies Reviewed:    The following studies were reviewed today:  Cardiac Studies & Procedures     STRESS TESTS  MYOCARDIAL PERFUSION IMAGING 10/08/2002   ECHOCARDIOGRAM  ECHOCARDIOGRAM COMPLETE 02/03/2023  Narrative ECHOCARDIOGRAM REPORT    Patient Name:   ANUOLUWAPO MEFFERD Date of Exam: 02/03/2023 Medical Rec #:  [id number]          Height:       63.0 in Accession #:    [id number]         Weight:       255.3 lb Date of Birth:  10/28/1945         BSA:          2.145 m Patient Age:    76 years           BP:           129/49 mmHg Patient Gender: F                  HR:           60 bpm. Exam Location:  Inpatient  Procedure: 2D Echo, Cardiac Doppler and Color Doppler  Indications:    CHF  History:        Patient has prior history of Echocardiogram examinations, most recent 09/26/2021. COPD, Signs/Symptoms:Parkinson's, Murmur, Dyspnea, Shortness of Breath and Edema; Risk Factors:Sleep Apnea, Hypertension and Diabetes. Lung CA.  Sonographer:    Milda Smart Referring Phys: [id number] RIPUDEEP K RAI   Sonographer Comments: Suboptimal subcostal window. Image acquisition challenging due to patient body habitus and Image acquisition challenging due to respiratory motion. IMPRESSIONS   1. Left ventricular ejection fraction, by estimation, is 65 to 70%.  The left ventricle has normal function. The left ventricle has no regional wall motion abnormalities. There is mild left ventricular hypertrophy. Left ventricular diastolic parameters are consistent with Grade I diastolic dysfunction (impaired relaxation). 2. Right ventricular systolic function is normal. The right ventricular size is normal. There is normal pulmonary artery systolic pressure. The estimated right ventricular systolic pressure  is 30.5 mmHg. 3. The mitral valve is degenerative. Trivial mitral valve regurgitation. No evidence of mitral stenosis. 4. The aortic valve is abnormal. There is moderate calcification of the aortic valve. Aortic valve regurgitation is not visualized. Mild aortic valve stenosis. Aortic valve area, by VTI measures 2.16 cm. Aortic valve mean gradient measures 16.0 mmHg. Aortic valve Vmax measures 2.49 m/s. 5. The inferior vena cava is normal in size with greater than 50% respiratory variability, suggesting right atrial pressure of 3 mmHg. 6. Increased flow velocities may be secondary to anemia, thyrotoxicosis, hyperdynamic or high flow state.  FINDINGS Left Ventricle: Left ventricular ejection fraction, by estimation, is 65 to 70%. The left ventricle has normal function. The left ventricle has no regional wall motion abnormalities. The left ventricular internal cavity size was normal in size. There is mild left ventricular hypertrophy. Left ventricular diastolic parameters are consistent with Grade I diastolic dysfunction (impaired relaxation).  Right Ventricle: The right ventricular size is normal. No increase in right ventricular wall thickness. Right ventricular systolic function is normal. There is normal pulmonary artery systolic pressure. The tricuspid regurgitant velocity is 2.62 m/s, and with an assumed right atrial pressure of 3 mmHg, the estimated right ventricular systolic pressure is 30.5 mmHg.  Left Atrium: Left atrial size was normal in size.  Right Atrium: Right atrial size was normal in size.  Pericardium: There is no evidence of pericardial effusion.  Mitral Valve: The mitral valve is degenerative in appearance. Mild mitral annular calcification. Trivial mitral valve regurgitation. No evidence of mitral valve stenosis. MV peak gradient, 7.3 mmHg. The mean mitral valve gradient is 3.0 mmHg.  Tricuspid Valve: The tricuspid valve is normal in structure. Tricuspid valve regurgitation  is trivial. No evidence of tricuspid stenosis.  Aortic Valve: The aortic valve is abnormal. There is moderate calcification of the aortic valve. Aortic valve regurgitation is not visualized. Mild aortic stenosis is present. Aortic valve mean gradient measures 16.0 mmHg. Aortic valve peak gradient measures 24.8 mmHg. Aortic valve area, by VTI measures 2.16 cm.  Pulmonic Valve: The pulmonic valve was normal in structure. Pulmonic valve regurgitation is trivial. No evidence of pulmonic stenosis.  Aorta: The aortic root is normal in size and structure.  Venous: The inferior vena cava is normal in size with greater than 50% respiratory variability, suggesting right atrial pressure of 3 mmHg.  IAS/Shunts: The interatrial septum was not well visualized.   LEFT VENTRICLE PLAX 2D LVIDd:         5.00 cm     Diastology LVIDs:         3.60 cm     LV e' medial:    4.57 cm/s LV PW:         1.20 cm     LV E/e' medial:  20.9 LV IVS:        1.20 cm     LV e' lateral:   6.64 cm/s LVOT diam:     2.20 cm     LV E/e' lateral: 14.4 LV SV:         116 LV SV Index:   54 LVOT Area:  3.80 cm  LV Volumes (MOD) LV vol d, MOD A2C: 69.8 ml LV vol d, MOD A4C: 84.6 ml LV vol s, MOD A2C: 24.9 ml LV vol s, MOD A4C: 37.3 ml LV SV MOD A2C:     44.9 ml LV SV MOD A4C:     84.6 ml LV SV MOD BP:      49.4 ml  RIGHT VENTRICLE            IVC RV Basal diam:  3.80 cm    IVC diam: 0.90 cm RV Mid diam:    3.30 cm RV S prime:     9.57 cm/s TAPSE (M-mode): 1.6 cm  LEFT ATRIUM             Index        RIGHT ATRIUM           Index LA diam:        4.00 cm 1.86 cm/m   RA Area:     14.10 cm LA Vol (A2C):   56.8 ml 26.48 ml/m  RA Volume:   30.90 ml  14.41 ml/m LA Vol (A4C):   58.1 ml 27.09 ml/m LA Biplane Vol: 58.6 ml 27.32 ml/m AORTIC VALVE AV Area (Vmax):    1.94 cm AV Area (Vmean):   1.89 cm AV Area (VTI):     2.16 cm AV Vmax:           249.00 cm/s AV Vmean:          191.000 cm/s AV VTI:             0.536 m AV Peak Grad:      24.8 mmHg AV Mean Grad:      16.0 mmHg LVOT Vmax:         127.00 cm/s LVOT Vmean:        95.000 cm/s LVOT VTI:          0.304 m LVOT/AV VTI ratio: 0.57  AORTA Ao Root diam: 3.10 cm Ao Asc diam:  3.60 cm  MITRAL VALVE                TRICUSPID VALVE MV Area (PHT): 2.56 cm     TR Peak grad:   27.5 mmHg MV Area VTI:   3.00 cm     TR Vmax:        262.00 cm/s MV Peak grad:  7.3 mmHg MV Mean grad:  3.0 mmHg     SHUNTS MV Vmax:       1.35 m/s     Systemic VTI:  0.30 m MV Vmean:      84.4 cm/s    Systemic Diam: 2.20 cm MV Decel Time: 296 msec MV E velocity: 95.70 cm/s MV A velocity: 129.00 cm/s MV E/A ratio:  0.74  [name] Brass MD Electronically signed by [name] Brass MD Signature Date/Time: 02/03/2023/2:34:46 PM    Final    MONITORS  LONG TERM MONITOR (3-14 DAYS) 03/12/2020  Narrative  Patient had a min HR of 53 bpm, max HR of 146 bpm, and avg HR of 79 bpm.  Predominant underlying rhythm was sinus rhythm.  One run of tachycardia occurred lasting 5 beats with a max rate of 102 bpm.  Isolated PACs were rare (<1.0%), with rare couplets present.  Isolated PVCs were rare (<1.0%), with rare couplets and bigeminy present.  No evidence of complete heart block.  Triggered and diary events associated with sinus rhythm.  No malignant arrhythmias.   CT SCANS  CT CORONARY MORPH W/CTA COR W/SCORE 10/20/2021  Addendum 10/21/2021  2:53 PM ADDENDUM REPORT: 10/21/2021 14:51  EXAM: OVER-READ INTERPRETATION  CT CHEST  The following report is an over-read performed by radiologist Dr. Marinda Elk [facility] on 10/21/2021. This over-read does not include interpretation of cardiac or coronary anatomy or pathology. The coronary CTA interpretation by the cardiologist is attached.  COMPARISON:  Prior CTA of the chest on 06/19/2017  FINDINGS: No significant noncardiac vascular findings. Visualized mediastinum and hilar regions  demonstrate no lymphadenopathy or masses. Visualized lungs show no evidence of pulmonary edema, consolidation, pneumothorax, nodule or pleural fluid. Visualized upper abdomen and bony structures are unremarkable.  IMPRESSION: No significant incidental findings.   Electronically Signed By: Irish Lack M.D. On: 10/21/2021 14:51  Narrative CLINICAL DATA:  77 yo female with chest pain  EXAM: Cardiac/Coronary CTA  TECHNIQUE: A non-contrast, gated CT scan was obtained with axial slices of 3 mm through the heart for calcium scoring. Calcium scoring was performed using the Agatston method. A 120 kV prospective, gated, contrast cardiac scan was obtained. Gantry rotation speed was 250 msecs and collimation was 0.6 mm. Two sublingual nitroglycerin tablets (0.8 mg) were given. The 3D data set was reconstructed in 5% intervals of the 35-75% of the R-R cycle. Diastolic phases were analyzed on a dedicated workstation using MPR, MIP, and VRT modes. The patient received 95 cc of contrast.  FINDINGS: Image quality: Average.  Noise artifact is: Moderate.  (Mis-registration).  Coronary Arteries:  Normal coronary origin.  Right dominance.  Left main: The left main is a large caliber vessel with a normal take off from the left coronary cusp that trifurcates into a LAD, LCX, and ramus intermedius. There is minimal (0-24) calcified plaque in the ostium.  Left anterior descending artery: The LAD is has minimal (0-24) soft plaque in the proximal to mid vessel. The LAD gives off 2 patent diagonal branches.  Ramus intermedius: Very small.  Left circumflex artery: The LCX is non-dominant and has mild (25-49) soft plaque in the mid vessel. The LCX gives off 2 patent obtuse marginal branches.  Right coronary artery: The RCA is dominant with normal take off from the right coronary cusp. There is mild (25-49) mixed plaque stenosis in the proximal vessel. The RCA terminates as a PDA and  right posterolateral branch without evidence of plaque or stenosis.  Right Atrium: Mildly dilated.  Right Ventricle: The right ventricular cavity is within normal limits.  Left Atrium: Left atrial size is mildly dilated with no left atrial appendage filling defect.  Left Ventricle: The ventricular cavity size is within normal limits. There are no stigmata of prior infarction. There is no abnormal filling defect.  Pulmonary arteries: Normal in size without proximal filling defect.  Pulmonary veins: Normal pulmonary venous drainage.  Pericardium: Normal thickness with no significant effusion or calcium present.  Cardiac valves: The aortic valve is trileaflet with calcification. The mitral valve is normal structure without significant calcification.  Aorta: Normal caliber with aortic atherosclerosis noted.  Extra-cardiac findings: See attached radiology report for non-cardiac structures.  IMPRESSION: 1. Coronary calcium score of 63.1. This was 32 percentile for age-, sex, and race-matched controls.  2. Normal coronary origin with right dominance.  3. Mild (25-49) stenoses in the Lcx and RCA.  4. Aortic atherosclerosis.  5. Calcified aortic valve.  6. Mild biatrial enlargement.  RECOMMENDATIONS: CAD-RADS 2: Mild non-obstructive CAD (25-49%). Consider non-atherosclerotic causes of chest pain. Consider preventive therapy and risk factor modification.  Olga Millers, MD  Electronically Signed: By: Olga Millers M.D. On: 10/20/2021 17:11            Recent Labs: 01/31/2023: ALT 11; B Natriuretic Peptide 147.6 02/03/2023: Hemoglobin 11.1; Platelets 233 02/04/2023: BUN 36; Creatinine, Ser 1.53; Potassium 4.4; Sodium 139  Recent Lipid Panel    Component Value Date/Time   CHOL  10/01/2009 0201    153        ATP III CLASSIFICATION:  <200     mg/dL   Desirable  981-191  mg/dL   Borderline High  >=478    mg/dL   High          TRIG 295 (H) 10/01/2009 0201    HDL 37 (L) 10/01/2009 0201   CHOLHDL 4.1 10/01/2009 0201   VLDL 54 (H) 10/01/2009 0201   LDLCALC  10/01/2009 0201    62        Total Cholesterol/HDL:CHD Risk Coronary Heart Disease Risk Table                     Men   Women  1/2 Average Risk   3.4   3.3  Average Risk       5.0   4.4  2 X Average Risk   9.6   7.1  3 X Average Risk  23.4   11.0        Use the calculated Patient Ratio above and the CHD Risk Table to determine the patient's CHD Risk.        ATP III CLASSIFICATION (LDL):  <100     mg/dL   Optimal  621-308  mg/dL   Near or Above                    Optimal  130-159  mg/dL   Borderline  657-846  mg/dL   High  >962     mg/dL   Very High       Physical Exam:    VS:  BP (!) 120/58   Pulse 62   Ht 5\' 3"  (1.6 m)   Wt 254 lb (115.2 kg)   SpO2 96%   BMI 44.99 kg/m     Wt Readings from Last 3 Encounters:  02/26/23 254 lb (115.2 kg)  02/04/23 256 lb 9.9 oz (116.4 kg)  07/20/22 261 lb 6.4 oz (118.6 kg)    Gen: no distress, morbidly obese   Neck: No JVD Cardiac: No rubs or gallops, systolic murmur, RRR +2 radial pulses Respiratory: No wheezes, normal effort, normal  respiratory rate GI: Soft, nontender, non-distended  MS: non pitting edema;  moves all extremities Integument: Skin feels warm Neuro:  At time of evaluation, alert and oriented to person/place/time/situation  Psych: Normal affect, patient feels ok  ASSESSMENT:    1. Paroxysmal atrial fibrillation (HCC)   2. Acute on chronic diastolic CHF (congestive heart failure) (HCC)   3. Aortic stenosis, mild   4. Diabetes mellitus with coincident hypertension (HCC)   5. OSA and COPD overlap syndrome (HCC)   6. Non-small cell carcinoma of lung, stage 4, unspecified laterality (HCC)     PLAN:    Acute and Chronic Heart Failure with Preserved Ejection Fraction Recent hospitalization for heart failure exacerbation complicated by acute kidney injury. Patient reports feeling short of breath again. Mild aortic  stenosis noted on recent echocardiogram. -Repeat basic metabolic panel and natriuretic peptide. -Consider increasing Lasix based on lab results (40 mg PO BID) -Consider referral to PharmD  clinic for evaluation of SGLT2 inhibitor if Lasix is increased (she has issues with medication cost)  Paroxysmal Atrial Fibrillation - Unclear why patient is no longer on anticoagulation. No current symptoms of palpitations. -Order a 2-week non-live heart monitor to assess for atrial fibrillation burden. -Discuss potential need for anticoagulation based on monitor results. - continue diltiazem) - continue ASA for now  Hyperlipidemia - History of aortic atherosclerosis and coronary artery calcifications. Concerns raised by pharmacy about current regimen of diltiazem and atorvastatin. -Check LDL direct. -Consider switching to rosuvastatin if LDL is above goal (rosuvastatin 10 mg planned) - chest pain is unchanged from Cardiac CT (mild CAD); I have offered her stress test, she will do this if her symptoms worsen  Acute Kidney Injury and CKD Stage 3 with Hyponatremia Recent hospitalization for heart failure exacerbation complicated by acute kidney injury. -Ensure close follow-up with basic natriuretic peptide if diuretic regimen is increased. - I may add MRA (no finerinone given diltiazem)  Obstructive Sleep Apnea Patient is currently on CPAP. -Continue CPAP.  Parkinson's Disease Patient is currently on Sinemet. -Continue Sinemet.  Metastatic Stage 4 Lung Cancer May be contributing to symptoms of shortness of breath. -[facility] follow[facility] at [facility] for this evaluation.  Morbid Obesity May be contributing to symptoms of shortness of breath. -Continue current management and monitor symptoms.  COPD Patient reports shortness of breath and is currently on inhaler therapy. -Continue current COPD medications as per primary  Type 2 Diabetes with Peripheral Neuropathy Recent elevated hemoglobin A1C. -  needs PCP follow up   Follow-up Schedule follow-up appointment with APP team in 2-3 months to ensure close monitoring of multiple conditions.  Time Spent Directly with Patient:   I have spent a total of 46 minutes with the patient reviewing notes, imaging, EKGs, labs, DC summary and examining the patient as well as establishing an assessment and plan that was discussed personally with the patient.     Medication Adjustments/Labs and Tests Ordered: Current medicines are reviewed at length with the patient today.  Concerns regarding medicines are outlined above.  Orders Placed This Encounter  Procedures   Pro b natriuretic peptide (BNP)   Basic metabolic panel   Direct LDL   LONG TERM MONITOR (3-14 DAYS)   No orders of the defined types were placed in this encounter.   Patient Instructions  Medication Instructions:  Your physician recommends that you continue on your current medications as directed. Please refer to the Current Medication list given to you today.  *If you need a refill on your cardiac medications before your next appointment, please call your pharmacy*  Lab Work: TODAY: BMET, BNP, LDL  If you have labs (blood work) drawn today and your tests are completely normal, you will receive your results only by: MyChart Message (if you have MyChart) OR A paper copy in the mail If you have any lab test that is abnormal or we need to change your treatment, we will call you to review the results.  Testing/Procedures: Your physician has recommended that you wear an event monitor. Event monitors are medical devices that record the heart's electrical activity. Doctors most often [place] these monitors to diagnose arrhythmias. Arrhythmias are problems with the speed or rhythm of the heartbeat. The monitor is a small, portable device. You can wear one while you do your normal daily activities. This is usually used to diagnose what is causing palpitations/syncope (passing  out).  Follow-Up: At [facility], you and your health needs are our  priority.  As part of our continuing mission to provide you with exceptional heart care, we have created designated Provider Care Teams.  These Care Teams include your primary Cardiologist (physician) and Advanced Practice Providers (APPs -  Physician Assistants and Nurse Practitioners) who all work together to provide you with the care you need, when you need it.   Your next appointment:   2-3 months  Provider:   Jari Favre, PA-C, Robin Searing, NP, Jacolyn Reedy, PA-C, Eligha Bridegroom, NP, Tereso Newcomer, PA-C, or Perlie Gold, PA-C       Other Instructions Christena Deem- Long Term Monitor Instructions  Your physician has requested you wear a ZIO patch monitor for 14 days.  This is a single patch monitor. [employer] supplies one patch monitor per enrollment. Additional stickers are not available. Please do not apply patch if you will be having a Nuclear Stress Test,  Echocardiogram, Cardiac CT, MRI, or Chest Xray during the period you would be wearing the  monitor. The patch cannot be worn during these tests. You cannot remove and re-apply the  ZIO XT patch monitor.  Your ZIO patch monitor will be mailed 3 day USPS to your address on file. It may take 3-5 days  to receive your monitor after you have been enrolled.  Once you have received your monitor, please review the enclosed instructions. Your monitor  has already been registered assigning a specific monitor serial # to you.  Billing and Patient Assistance Program Information  We have supplied [employer] with any of your insurance information on file for billing purposes. [employer] offers a sliding scale Patient Assistance Program for patients that do not have  insurance, or whose insurance does not completely cover the cost of the ZIO monitor.  You must apply for the Patient Assistance Program to qualify for this discounted rate.  To apply, please call [employer] at  [phone], select option 4, select option 2, ask to apply for  Patient Assistance Program. Meredeth Ide will ask your household income, and how many people  are in your household. They will quote your out-of-pocket cost based on that information.  [employer] will also be able to set up a 47-month, interest-free payment plan if needed.  Applying the monitor   Shave hair from upper left chest.  Hold abrader disc by orange tab. Rub abrader in 40 strokes over the upper left chest as  indicated in your monitor instructions.  Clean area with 4 enclosed alcohol pads. Let dry.  Apply patch as indicated in monitor instructions. Patch will be placed under collarbone on left  side of chest with arrow pointing upward.  Rub patch adhesive wings for 2 minutes. Remove white label marked "1". Remove the white  label marked "2". Rub patch adhesive wings for 2 additional minutes.  While looking in a mirror, press and release button in center of patch. A small green light will  flash 3-4 times. This will be your only indicator that the monitor has been turned on.  Do not shower for the first 24 hours. You may shower after the first 24 hours.  Press the button if you feel a symptom. You will hear a small click. Record Date, Time and  Symptom in the Patient Logbook.  When you are ready to remove the patch, follow instructions on the last 2 pages of Patient  Logbook. Stick patch monitor onto the last page of Patient Logbook.  Place Patient Logbook in the blue and white box. Use locking tab on box and  tape box closed  securely. The blue and white box has prepaid postage on it. Please place it in the mailbox as  soon as possible. Your physician should have your test results approximately 7 days after the  monitor has been mailed back to [facility]  Call [facility] Customer Care at [phone] if you have questions regarding  your ZIO XT patch monitor. Call them immediately if you see an orange light  blinking on your  monitor.  If your monitor falls off in less than 4 days, contact our Monitor department at [phone].  If your monitor becomes loose or falls off after 4 days call [employer] at [phone] for  suggestions on securing your monitor     Signed, Christell Constant, MD  02/26/2023 3:37 PM    [facility]

## 2023-02-26 NOTE — Patient Instructions (Signed)
Medication Instructions:  Your physician recommends that you continue on your current medications as directed. Please refer to the Current Medication list given to you today.  *If you need a refill on your cardiac medications before your next appointment, please call your pharmacy*  Lab Work: TODAY: BMET, BNP, LDL  If you have labs (blood work) drawn today and your tests are completely normal, you will receive your results only by: MyChart Message (if you have MyChart) OR A paper copy in the mail If you have any lab test that is abnormal or we need to change your treatment, we will call you to review the results.  Testing/Procedures: Your physician has recommended that you wear an event monitor. Event monitors are medical devices that record the heart's electrical activity. Doctors most often [place] these monitors to diagnose arrhythmias. Arrhythmias are problems with the speed or rhythm of the heartbeat. The monitor is a small, portable device. You can wear one while you do your normal daily activities. This is usually used to diagnose what is causing palpitations/syncope (passing out).  Follow-Up: At [facility], you and your health needs are our priority.  As part of our continuing mission to provide you with exceptional heart care, we have created designated Provider Care Teams.  These Care Teams include your primary Cardiologist (physician) and Advanced Practice Providers (APPs -  Physician Assistants and Nurse Practitioners) who all work together to provide you with the care you need, when you need it.   Your next appointment:   2-3 months  Provider:   Jari Favre, PA-C, Robin Searing, NP, Jacolyn Reedy, PA-C, Eligha Bridegroom, NP, Tereso Newcomer, PA-C, or Perlie Gold, PA-C       Other Instructions Christena Deem- Long Term Monitor Instructions  Your physician has requested you wear a ZIO patch monitor for 14 days.  This is a single patch monitor. [employer] supplies one patch monitor  per enrollment. Additional stickers are not available. Please do not apply patch if you will be having a Nuclear Stress Test,  Echocardiogram, Cardiac CT, MRI, or Chest Xray during the period you would be wearing the  monitor. The patch cannot be worn during these tests. You cannot remove and re-apply the  ZIO XT patch monitor.  Your ZIO patch monitor will be mailed 3 day USPS to your address on file. It may take 3-5 days  to receive your monitor after you have been enrolled.  Once you have received your monitor, please review the enclosed instructions. Your monitor  has already been registered assigning a specific monitor serial # to you.  Billing and Patient Assistance Program Information  We have supplied [employer] with any of your insurance information on file for billing purposes. [employer] offers a sliding scale Patient Assistance Program for patients that do not have  insurance, or whose insurance does not completely cover the cost of the ZIO monitor.  You must apply for the Patient Assistance Program to qualify for this discounted rate.  To apply, please call [employer] at [phone], select option 4, select option 2, ask to apply for  Patient Assistance Program. Meredeth Ide will ask your household income, and how many people  are in your household. They will quote your out-of-pocket cost based on that information.  [employer] will also be able to set up a 54-month, interest-free payment plan if needed.  Applying the monitor   Shave hair from upper left chest.  Hold abrader disc by orange tab. Rub abrader in 40 strokes over the  upper left chest as  indicated in your monitor instructions.  Clean area with 4 enclosed alcohol pads. Let dry.  Apply patch as indicated in monitor instructions. Patch will be placed under collarbone on left  side of chest with arrow pointing upward.  Rub patch adhesive wings for 2 minutes. Remove white label marked "1". Remove the white  label marked "2". Rub patch  adhesive wings for 2 additional minutes.  While looking in a mirror, press and release button in center of patch. A small green light will  flash 3-4 times. This will be your only indicator that the monitor has been turned on.  Do not shower for the first 24 hours. You may shower after the first 24 hours.  Press the button if you feel a symptom. You will hear a small click. Record Date, Time and  Symptom in the Patient Logbook.  When you are ready to remove the patch, follow instructions on the last 2 pages of Patient  Logbook. Stick patch monitor onto the last page of Patient Logbook.  Place Patient Logbook in the blue and white box. Use locking tab on box and tape box closed  securely. The blue and white box has prepaid postage on it. Please place it in the mailbox as  soon as possible. Your physician should have your test results approximately 7 days after the  monitor has been mailed back to [facility].  Call [facility] Customer Care at [phone] if you have questions regarding  your ZIO XT patch monitor. Call them immediately if you see an orange light blinking on your  monitor.  If your monitor falls off in less than 4 days, contact our Monitor department at [phone].  If your monitor becomes loose or falls off after 4 days call [employer] at [phone] for  suggestions on securing your monitor

## 2023-02-27 LAB — BASIC METABOLIC PANEL
BUN/Creatinine Ratio: 19 (ref 12–28)
BUN: 27 mg/dL (ref 8–27)
CO2: 23 mmol/L (ref 20–29)
Calcium: 9.6 mg/dL (ref 8.7–10.3)
Chloride: 103 mmol/L (ref 96–106)
Creatinine, Ser: 1.4 mg/dL — ABNORMAL HIGH (ref 0.57–1.00)
Glucose: 129 mg/dL — ABNORMAL HIGH (ref 70–99)
Potassium: 4.3 mmol/L (ref 3.5–5.2)
Sodium: 143 mmol/L (ref 134–144)
eGFR: 39 mL/min/{1.73_m2} — ABNORMAL LOW (ref >59)

## 2023-02-27 LAB — PRO B NATRIURETIC PEPTIDE: NT-Pro BNP: 389 pg/mL (ref 0–738)

## 2023-02-27 LAB — LDL CHOLESTEROL, DIRECT: LDL Direct: 85 mg/dL (ref 0–99)

## 2023-02-27 LAB — BRAIN NATRIURETIC PEPTIDE: BNP: 43.9 pg/mL (ref 0.0–100.0)

## 2023-03-02 ENCOUNTER — Telehealth: Payer: Self-pay | Admitting: Internal Medicine

## 2023-03-02 DIAGNOSIS — I5033 Acute on chronic diastolic (congestive) heart failure: Secondary | ICD-10-CM

## 2023-03-02 NOTE — Telephone Encounter (Signed)
Left a message to call back.

## 2023-03-02 NOTE — Telephone Encounter (Signed)
Pt is returning a call to nurse for results

## 2023-03-03 DIAGNOSIS — I480 Paroxysmal atrial fibrillation: Secondary | ICD-10-CM

## 2023-03-03 DIAGNOSIS — I5033 Acute on chronic diastolic (congestive) heart failure: Secondary | ICD-10-CM

## 2023-03-08 MED ORDER — SPIRONOLACTONE 25 MG PO TABS: 12.5000 mg | ORAL_TABLET | Freq: Every day | ORAL | 3 refills | Status: AC

## 2023-03-08 MED ORDER — ROSUVASTATIN CALCIUM 10 MG PO TABS: 10.0000 mg | ORAL_TABLET | Freq: Every day | ORAL | 3 refills | Status: DC

## 2023-03-08 NOTE — Telephone Encounter (Signed)
-----   Message from Christell Constant sent at 03/01/2023 12:11 PM EST ----- Results: Normal BNP, Creatinine has improved, LDL is above goal Plan: Transition to rosuvastatin 10 mg and labs at next follow up Jardiance 10 mg; will likely need [employer] Cardiomyopathy grant or patient assist  Christell Constant, MD

## 2023-03-08 NOTE — Telephone Encounter (Signed)
Patient returned RN's call regarding results. 

## 2023-03-08 NOTE — Telephone Encounter (Signed)
The patient has been notified of the result and verbalized understanding.  All questions (if any) were answered. [name] Burows, RN 03/08/2023 11:47 AM   Spoke with MD pt was recently started on Synjardy by PCP.  Instead of starting Jardiance 10 mg pt will start spironolactone 12.5 mg PO every day with recheck labs in 1-2 weeks.  Will have repeat lipid panel check at next OV with Ronie Spies, PA on 05/09/23. Noted on appointment note.

## 2023-03-08 NOTE — Telephone Encounter (Signed)
Left a message to call back.

## 2023-03-19 DIAGNOSIS — I5033 Acute on chronic diastolic (congestive) heart failure: Secondary | ICD-10-CM | POA: Diagnosis not present

## 2023-03-19 DIAGNOSIS — I480 Paroxysmal atrial fibrillation: Secondary | ICD-10-CM | POA: Diagnosis not present

## 2023-03-29 ENCOUNTER — Other Ambulatory Visit: Payer: Self-pay | Admitting: Internal Medicine

## 2023-03-29 DIAGNOSIS — I5033 Acute on chronic diastolic (congestive) heart failure: Secondary | ICD-10-CM | POA: Diagnosis not present

## 2023-03-30 DIAGNOSIS — H66004 Acute suppurative otitis media without spontaneous rupture of ear drum, recurrent, right ear: Secondary | ICD-10-CM | POA: Diagnosis not present

## 2023-03-30 DIAGNOSIS — D485 Neoplasm of uncertain behavior of skin: Secondary | ICD-10-CM | POA: Diagnosis not present

## 2023-03-30 LAB — BASIC METABOLIC PANEL
BUN/Creatinine Ratio: 20 (ref 12–28)
BUN: 29 mg/dL — ABNORMAL HIGH (ref 8–27)
CO2: 21 mmol/L (ref 20–29)
Calcium: 9.1 mg/dL (ref 8.7–10.3)
Chloride: 100 mmol/L (ref 96–106)
Creatinine, Ser: 1.43 mg/dL — ABNORMAL HIGH (ref 0.57–1.00)
Glucose: 239 mg/dL — ABNORMAL HIGH (ref 70–99)
Potassium: 4.2 mmol/L (ref 3.5–5.2)
Sodium: 141 mmol/L (ref 134–144)
eGFR: 38 mL/min/{1.73_m2} — ABNORMAL LOW (ref >59)

## 2023-04-03 DIAGNOSIS — T402X5A Adverse effect of other opioids, initial encounter: Secondary | ICD-10-CM | POA: Diagnosis not present

## 2023-04-03 DIAGNOSIS — G8929 Other chronic pain: Secondary | ICD-10-CM | POA: Diagnosis not present

## 2023-04-03 DIAGNOSIS — R530 Neoplastic (malignant) related fatigue: Secondary | ICD-10-CM | POA: Diagnosis not present

## 2023-04-03 DIAGNOSIS — M2550 Pain in unspecified joint: Secondary | ICD-10-CM | POA: Diagnosis not present

## 2023-04-03 DIAGNOSIS — M4727 Other spondylosis with radiculopathy, lumbosacral region: Secondary | ICD-10-CM | POA: Diagnosis not present

## 2023-04-03 DIAGNOSIS — K5903 Drug induced constipation: Secondary | ICD-10-CM | POA: Diagnosis not present

## 2023-04-03 DIAGNOSIS — R52 Pain, unspecified: Secondary | ICD-10-CM | POA: Diagnosis not present

## 2023-04-03 DIAGNOSIS — M5442 Lumbago with sciatica, left side: Secondary | ICD-10-CM | POA: Diagnosis not present

## 2023-04-04 DIAGNOSIS — M1712 Unilateral primary osteoarthritis, left knee: Secondary | ICD-10-CM | POA: Diagnosis not present

## 2023-05-09 ENCOUNTER — Ambulatory Visit: Payer: Medicare Other | Attending: Physician Assistant | Admitting: Physician Assistant

## 2023-05-09 NOTE — Progress Notes (Deleted)
 Cardiology Office Note:    Date:  05/09/2023  ID:  [name], DOB 1945-07-11, MRN [id number] PCP: [name], MD  [facility] Providers Cardiologist:  Christell Constant, MD { Click to update primary MD,subspecialty MD or APP then REFRESH:1}    {Click to Open Review  :1}   Patient Profile:     [name] is a 78 year old female with visit pertinent history of HTN, T2DM, HLD, CAC and aortic atherosclerosis, IDA, obesity, OSA, COPD, NSCLS s/p palliative radiation 2018, PAF.  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  She established with cardiology in 2021 for atrial fibrillation found at her PCPs office.  She was started on Eliquis and diltiazem at that time.  Heart monitor at that time showed NSR with rare PACs/PVCs.  Echocardiogram showed normal EF, grade 2 DD, mild RV SF reduction, mild RV enlargement, mildly elevated PASP, moderate BAE, mild AS.  Coronary CTA in June 2023 showed coronary calcium score 63.1 which is 55 percentile with mild stenosis and LCx and RCA as well as aortic atherosclerosis.    She was hospitalized October 2024 with hypoxia, COPD, heart failure.  It was noted that she had run out of her Lasix.  Follow-up echocardiogram October 2024 showed LVEF 60 to 65%, grade 1 DD, normal RV/PASP, mild AS.    She saw Dr. Izora Ribas in follow-up on 02/26/2023.  Her BNP was normal and statin was transition to rosuvastatin and she was started on spironolactone. Repeat heart monitor 03/20/2023 showed NSR average 84 bpm, range 49-163 bpm, 2 short runs of NSVT, rare PACs, 3.8% PVC burden.      History of Present Illness:  Discussed the use of AI scribe software for clinical note transcription with the patient, who gave verbal consent to proceed.  [name] is a 78 y.o. female who returns for ***Discussed the use of AI scribe software for clinical note transcription with the  patient, who gave verbal consent to proceed.  History of Present Illness            ROS   See HPI ***     Home Medications:    Prior to Admission medications   Medication Sig Start Date End Date Taking? Authorizing Provider  albuterol (PROVENTIL) (2.5 MG/3ML) 0.083% nebulizer solution Take 3 mLs (2.5 mg total) by nebulization every 6 (six) hours as needed for wheezing or shortness of breath. 03/17/20   Charlott Holler, MD  albuterol (VENTOLIN HFA) 108 (90 Base) MCG/ACT inhaler Inhale 2 puffs into the lungs every 6 (six) hours as needed for wheezing or shortness of breath. 06/14/20   Charlott Holler, MD  ALPRAZolam Prudy Feeler) 1 MG tablet Take 1 tablet (1 mg total) 2 (two) times daily as needed by mouth for anxiety. 03/05/17   Rowe Clack, PA-C  aspirin 81 MG chewable tablet Chew 81-162 mg by mouth as needed (for chest pain).    [provider]  carbidopa-levodopa (SINEMET IR) 25-100 MG tablet Take 1 tablet by mouth in the morning and at bedtime. 02/04/23   Rai, Ripudeep Kirtland Bouchard, MD  Cholecalciferol (VITAMIN D3) 125 MCG (5000 UT) CAPS Take 5,000 Units by mouth daily.    [provider]  diltiazem (CARDIZEM CD) 120 MG 24 hr capsule Take 1 capsule (120 mg total) by mouth daily. 02/04/23   Rai, Ripudeep K, MD  fentaNYL (DURAGESIC - DOSED MCG/HR) 100 MCG/HR Place 100 mcg onto the skin every other day.  02/21/17   [provider]  fentaNYL (DURAGESIC) 25 MCG/HR Place 1 patch onto the skin every other day. 07/26/20   [provider]  fluticasone (FLONASE) 50 MCG/ACT nasal spray Place 1-2 sprays into both nostrils daily as needed for allergies or rhinitis. 07/01/21   [provider]  furosemide (LASIX) 40 MG tablet Take 1 tablet (40 mg total) by mouth daily. 02/04/23   Rai, Ripudeep Kirtland Bouchard, MD  HUMALOG KWIKPEN 100 UNIT/ML KiwkPen Inject 0.12 mLs (12 Units total) into the skin 3 (three) times daily before meals. Along with your normal sliding scale. Patient taking  differently: Inject 3-4 Units into the skin daily as needed (for a BGL greater than 300). 06/20/17   Arnetha Courser, MD  hydrocortisone (CORTEF) 5 MG tablet Take 10-15 mg by mouth See admin instructions. Take 15 mg by mouth in the morning and 10 mg at bedtime 03/15/19   [provider]  insulin glargine (LANTUS) 100 UNIT/ML injection Inject 40-60 Units into the skin See admin instructions. Inject 60 units into the skin in the morning and 40 units at bedtime    [provider]  nitroGLYCERIN (NITROSTAT) 0.4 MG SL tablet PLACE AND DISSOLVE ONE TABLET UNDER TONGUE AS NEEDED FOR CHEST PAIN( EVERY 5 MINUTES, MAY TAKE UP TO 3 TIMES DAILY) 11/01/21   Chandrasekhar, Mahesh A, MD  omeprazole (PRILOSEC) 20 MG capsule Take 20 mg by mouth daily as needed (acid reflux). 05/04/19   [provider]  ondansetron (ZOFRAN) 8 MG tablet Take 8 mg by mouth every 8 (eight) hours as needed for nausea or vomiting.    [provider]  Oxycodone HCl 20 MG TABS Take 20 mg by mouth every 4 (four) hours as needed (for pain).    [provider]  pregabalin (LYRICA) 50 MG capsule Take 50 mg by mouth in the morning. 02/05/20   [provider]  pregabalin (LYRICA) 75 MG capsule Take 75 mg by mouth at bedtime. 02/05/20   [provider]  PRESCRIPTION MEDICATION CPAP- At bedtime    [provider]  promethazine (PHENERGAN) 12.5 MG tablet Take 12.5 mg by mouth every 6 (six) hours as needed for nausea or vomiting. 09/19/18   [provider]  rosuvastatin (CRESTOR) 10 MG tablet Take 1 tablet (10 mg total) by mouth daily. 03/08/23   Chandrasekhar, Rondel Jumbo, MD  sertraline (ZOLOFT) 100 MG tablet Take 100 mg by mouth daily. 06/29/13   [provider]  spironolactone (ALDACTONE) 25 MG tablet Take 0.5 tablets (12.5 mg total) by mouth daily. 03/08/23   Chandrasekhar, Mahesh A, MD  SYNJARDY XR 01-999 MG TB24 Take 1 tablet by mouth every morning. 02/12/23    [provider]  TRELEGY ELLIPTA 100-62.5-25 MCG/INH AEPB Take 1 puff by mouth daily. 07/09/20   Charlott Holler, MD   Studies Reviewed:       *** Risk Assessment/Calculations:   {Does this patient have ATRIAL FIBRILLATION?:[phone]} No BP recorded.  {Refresh Note OR Click here to enter BP  :1}***       Physical Exam:   VS:  There were no vitals taken for this visit.   Wt Readings from Last 3 Encounters:  02/26/23 254 lb (115.2 kg)  02/04/23 256 lb 9.9 oz (116.4 kg)  07/20/22 261 lb 6.4 oz (118.6 kg)    Physical Exam***     Assessment and Plan:  Assessment and Plan              {Are you  ordering a CV Procedure (e.g. stress test, cath, DCCV, TEE, etc)?   Press F2        :[id number]}  Dispo:  No follow-ups on file.  Signed, Denyce Robert, NP        05/09/2023, 6:50 AM [facility] [address] Office [phone] Fax [phone]    I spent***minutes examining this patient, reviewing medications, and using patient centered shared decision making involving their cardiac care.   I spent greater than 20 minutes reviewing their past medical history,  medications, and prior cardiac tests.

## 2023-05-10 ENCOUNTER — Encounter: Payer: Self-pay | Admitting: Physician Assistant

## 2023-05-29 DIAGNOSIS — N1831 Chronic kidney disease, stage 3a: Secondary | ICD-10-CM | POA: Diagnosis not present

## 2023-05-29 DIAGNOSIS — Z03818 Encounter for observation for suspected exposure to other biological agents ruled out: Secondary | ICD-10-CM | POA: Diagnosis not present

## 2023-05-29 DIAGNOSIS — R6883 Chills (without fever): Secondary | ICD-10-CM | POA: Diagnosis not present

## 2023-05-29 DIAGNOSIS — R053 Chronic cough: Secondary | ICD-10-CM | POA: Diagnosis not present

## 2023-05-29 DIAGNOSIS — E1165 Type 2 diabetes mellitus with hyperglycemia: Secondary | ICD-10-CM | POA: Diagnosis not present

## 2023-05-29 DIAGNOSIS — H6692 Otitis media, unspecified, left ear: Secondary | ICD-10-CM | POA: Diagnosis not present

## 2023-05-31 DIAGNOSIS — Z87891 Personal history of nicotine dependence: Secondary | ICD-10-CM | POA: Diagnosis not present

## 2023-05-31 DIAGNOSIS — E119 Type 2 diabetes mellitus without complications: Secondary | ICD-10-CM | POA: Diagnosis not present

## 2023-05-31 DIAGNOSIS — R918 Other nonspecific abnormal finding of lung field: Secondary | ICD-10-CM | POA: Diagnosis not present

## 2023-05-31 DIAGNOSIS — J219 Acute bronchiolitis, unspecified: Secondary | ICD-10-CM | POA: Diagnosis not present

## 2023-05-31 DIAGNOSIS — G893 Neoplasm related pain (acute) (chronic): Secondary | ICD-10-CM | POA: Diagnosis not present

## 2023-05-31 DIAGNOSIS — C3411 Malignant neoplasm of upper lobe, right bronchus or lung: Secondary | ICD-10-CM | POA: Diagnosis not present

## 2023-05-31 DIAGNOSIS — Z08 Encounter for follow-up examination after completed treatment for malignant neoplasm: Secondary | ICD-10-CM | POA: Diagnosis not present

## 2023-05-31 DIAGNOSIS — E2740 Unspecified adrenocortical insufficiency: Secondary | ICD-10-CM | POA: Diagnosis not present

## 2023-05-31 DIAGNOSIS — Z85118 Personal history of other malignant neoplasm of bronchus and lung: Secondary | ICD-10-CM | POA: Diagnosis not present

## 2023-07-03 DIAGNOSIS — M1712 Unilateral primary osteoarthritis, left knee: Secondary | ICD-10-CM | POA: Diagnosis not present

## 2023-07-04 IMAGING — MR MR KNEE*L* W/O CM
6 series · 40 of 40 positions shown · non-contrast
Comparison: Lower leg radiographs 07/21/2019. Knee radiographs
02/28/2019.

CLINICAL DATA: Knee pain with numbness and swelling for 1 year. No
acute injury or prior relevant surgery.

EXAM:
MRI OF THE LEFT KNEE WITHOUT CONTRAST
TECHNIQUE: Multiplanar, multisequence MR imaging of the knee was performed. No
intravenous contrast was administered.

[Series 6: T2 fat-sat · axial · left · 4.0mm · 0.62mm/px · z∈[-79,+74]mm · 9 of 36 slices shown (1 of 3)]
[im 1/36]
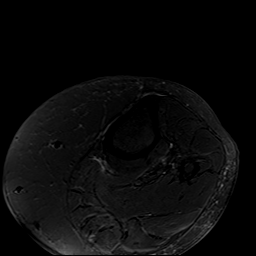
[im 5/36]
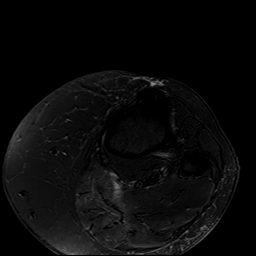
[im 9/36]
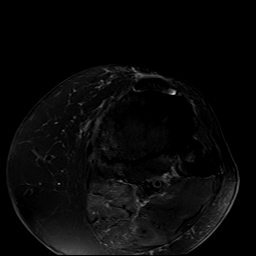
[im 14/36]
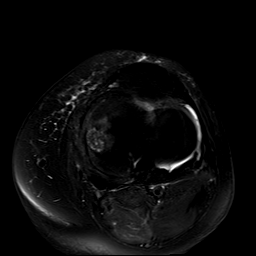
[im 18/36]
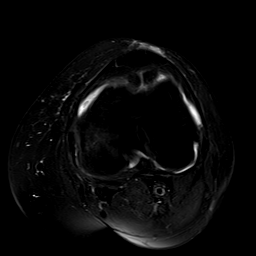
[im 22/36]
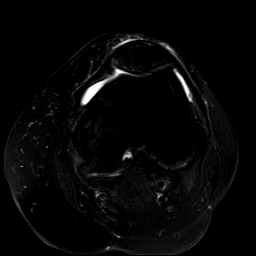
[im 27/36]
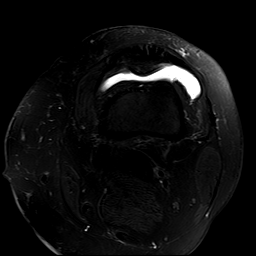
[im 31/36]
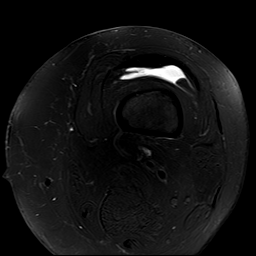
[im 36/36]
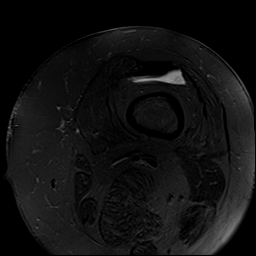

[Series 7: T2 fat-sat · coronal · left · 4.0mm · 0.59mm/px · 7 of 28 slices shown (2 of 3)]
[im 1/28]
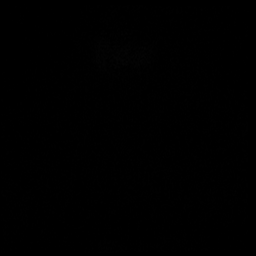
[im 5/28]
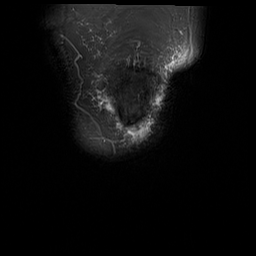
[im 10/28]
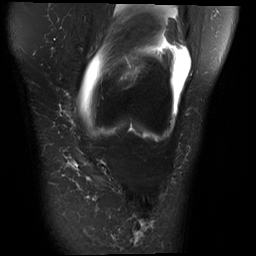
[im 14/28]
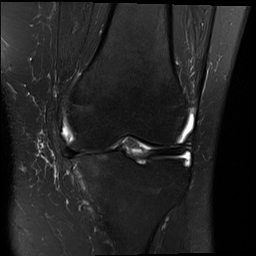
[im 19/28]
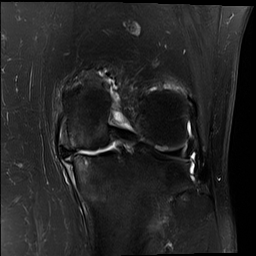
[im 23/28]
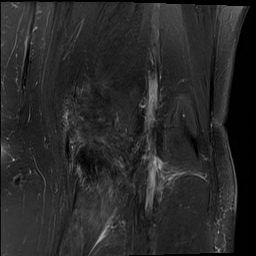
[im 28/28]
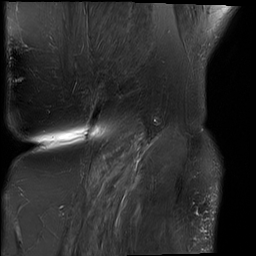

[Series 8: T1 · coronal · left · 4.0mm · 0.59mm/px · 6 of 28 slices shown]
[im 1/28]
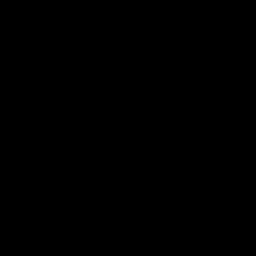
[im 6/28]
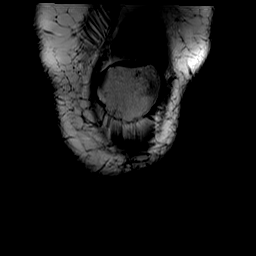
[im 11/28]
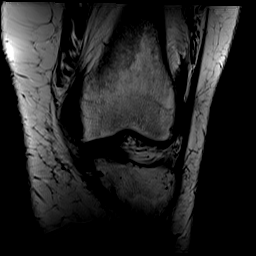
[im 17/28]
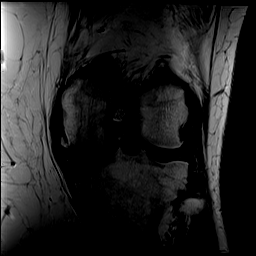
[im 22/28]
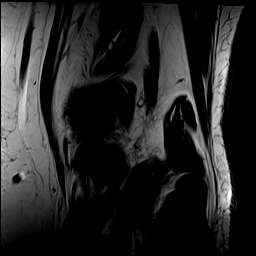
[im 28/28]
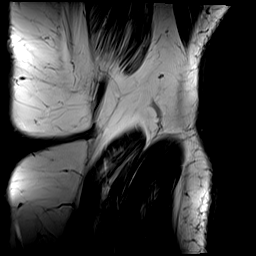

[Series 9: PD fat-sat · coronal · left · 3.0mm · 0.47mm/px · 6 of 28 slices shown (1 of 2)]
[im 1/28]
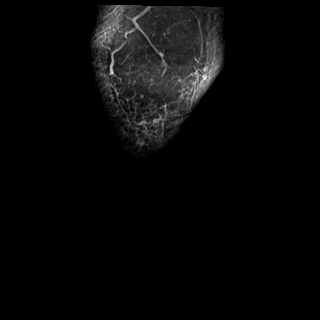
[im 6/28]
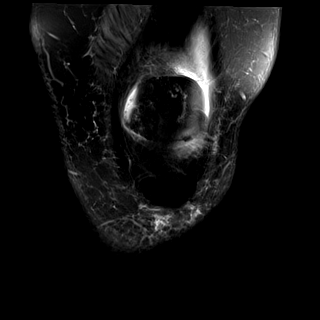
[im 11/28]
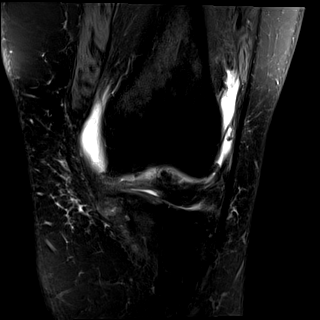
[im 17/28]
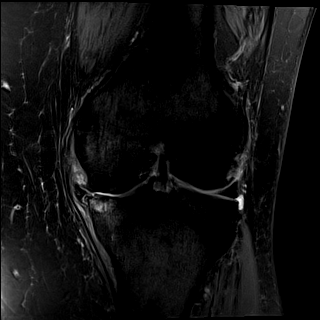
[im 22/28]
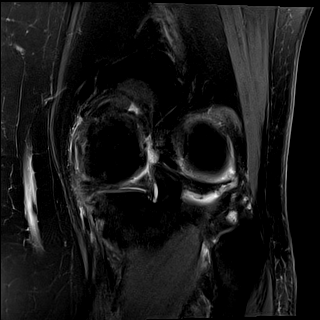
[im 28/28]
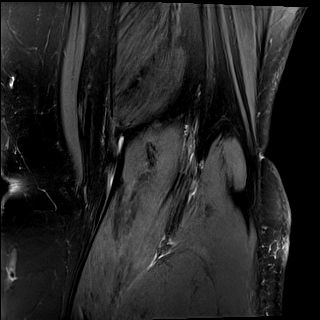

[Series 10: PD fat-sat · sagittal · left · 3.0mm · 0.59mm/px · 6 of 27 slices shown (2 of 2)]
[im 1/27]
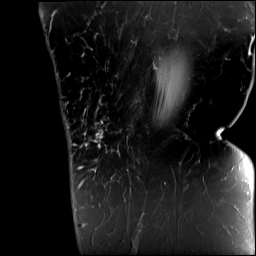
[im 6/27]
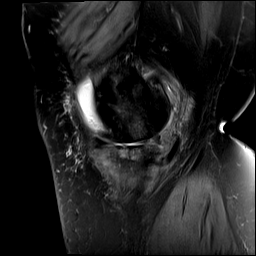
[im 11/27]
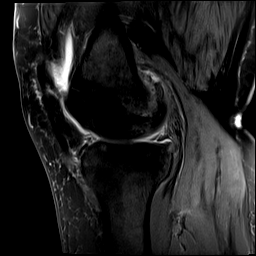
[im 16/27]
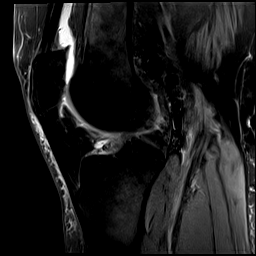
[im 21/27]
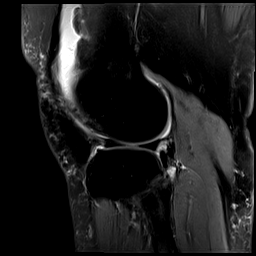
[im 27/27]
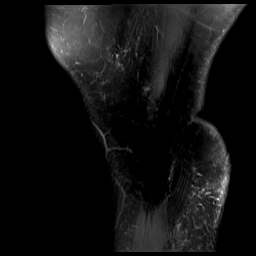

[Series 11: T2 fat-sat · sagittal · left · 3.0mm · 0.59mm/px · 6 of 27 slices shown (3 of 3)]
[im 1/27]
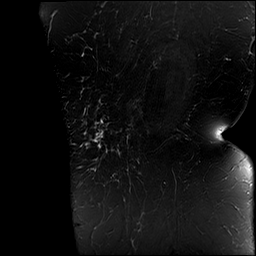
[im 6/27]
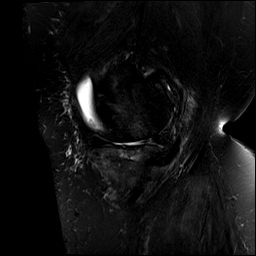
[im 11/27]
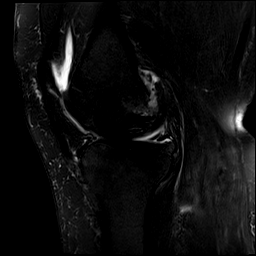
[im 16/27]
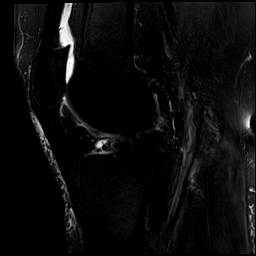
[im 21/27]
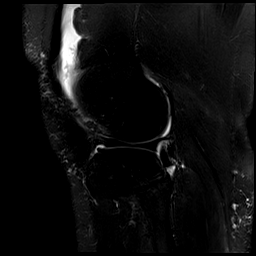
[im 27/27]
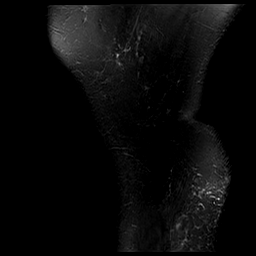

[40 of 40 positions shown; findings below may reference images not displayed]

FINDINGS: Despite efforts by the technologist and patient, mild motion
artifact is present on today's exam and could not be eliminated.
This reduces exam sensitivity and specificity.

MENISCI

Medial meniscus: Irregular degenerative tearing throughout the
posterior horn. The meniscus is moderately extruded peripherally
from the joint, although the meniscal root appears intact, and no
centrally displaced meniscal fragments are identified.

Lateral meniscus:  Intact with normal morphology.

LIGAMENTS

Cruciates:  Intact.

Collaterals: Intact. There is medial buckling of the MCL related to
the medial meniscal extrusion.

CARTILAGE

Patellofemoral: Moderate patellofemoral degenerative changes with
diffuse chondral thinning, osteophytes and subchondral cyst
formation.

Medial: Advanced osteoarthritis with diffuse chondral thinning,
osteophytes and subchondral edema. There is prominent subchondral
cyst formation within the medial tibial plateau.

Lateral: Chondral thinning with surface irregularity along the
posterior aspect of the lateral femoral condyle and peripheral
osteophyte formation.

MISCELLANEOUS

Joint:  Moderate-sized joint effusion.  No loose bodies identified.

Popliteal Fossa:  Unremarkable. No significant Baker's cyst.

Extensor Mechanism:  Intact.

Bones:  No acute or significant extra-articular osseous findings.

Other: Mild soft tissue edema within the medial head of the
gastrocnemius muscle which may reflect muscular strain or subacute
denervation. No significant focal muscular atrophy.
IMPRESSION: 1. Diffuse degenerative tearing of the posterior horn of the medial
meniscus. The meniscus is moderately extruded peripherally from the
joint.
2. Underlying tricompartmental osteoarthritis, most advanced in the
medial compartment. No acute osseous findings.
3. The lateral meniscus, cruciate and collateral ligaments are
intact.
4. Mild edema within the medial head of the gastrocnemius muscle
which may reflect muscular strain or subacute denervation.

## 2023-07-24 DIAGNOSIS — M2550 Pain in unspecified joint: Secondary | ICD-10-CM | POA: Diagnosis not present

## 2023-07-24 DIAGNOSIS — T402X5A Adverse effect of other opioids, initial encounter: Secondary | ICD-10-CM | POA: Diagnosis not present

## 2023-07-24 DIAGNOSIS — G8929 Other chronic pain: Secondary | ICD-10-CM | POA: Diagnosis not present

## 2023-07-24 DIAGNOSIS — R251 Tremor, unspecified: Secondary | ICD-10-CM | POA: Diagnosis not present

## 2023-07-24 DIAGNOSIS — R5383 Other fatigue: Secondary | ICD-10-CM | POA: Diagnosis not present

## 2023-07-24 DIAGNOSIS — M549 Dorsalgia, unspecified: Secondary | ICD-10-CM | POA: Diagnosis not present

## 2023-07-24 DIAGNOSIS — C3490 Malignant neoplasm of unspecified part of unspecified bronchus or lung: Secondary | ICD-10-CM | POA: Diagnosis not present

## 2023-07-24 DIAGNOSIS — M79605 Pain in left leg: Secondary | ICD-10-CM | POA: Diagnosis not present

## 2023-07-24 DIAGNOSIS — K5903 Drug induced constipation: Secondary | ICD-10-CM | POA: Diagnosis not present

## 2023-07-24 DIAGNOSIS — Z79899 Other long term (current) drug therapy: Secondary | ICD-10-CM | POA: Diagnosis not present

## 2023-07-24 DIAGNOSIS — Z87891 Personal history of nicotine dependence: Secondary | ICD-10-CM | POA: Diagnosis not present

## 2023-07-24 DIAGNOSIS — M792 Neuralgia and neuritis, unspecified: Secondary | ICD-10-CM | POA: Diagnosis not present

## 2023-08-31 DIAGNOSIS — G893 Neoplasm related pain (acute) (chronic): Secondary | ICD-10-CM | POA: Diagnosis not present

## 2023-08-31 DIAGNOSIS — M545 Low back pain, unspecified: Secondary | ICD-10-CM | POA: Diagnosis not present

## 2023-08-31 DIAGNOSIS — C779 Secondary and unspecified malignant neoplasm of lymph node, unspecified: Secondary | ICD-10-CM | POA: Diagnosis not present

## 2023-08-31 DIAGNOSIS — I3131 Malignant pericardial effusion in diseases classified elsewhere: Secondary | ICD-10-CM | POA: Diagnosis not present

## 2023-08-31 DIAGNOSIS — C7951 Secondary malignant neoplasm of bone: Secondary | ICD-10-CM | POA: Diagnosis not present

## 2023-08-31 DIAGNOSIS — R5383 Other fatigue: Secondary | ICD-10-CM | POA: Diagnosis not present

## 2023-08-31 DIAGNOSIS — Z9049 Acquired absence of other specified parts of digestive tract: Secondary | ICD-10-CM | POA: Diagnosis not present

## 2023-08-31 DIAGNOSIS — M25519 Pain in unspecified shoulder: Secondary | ICD-10-CM | POA: Diagnosis not present

## 2023-08-31 DIAGNOSIS — Z923 Personal history of irradiation: Secondary | ICD-10-CM | POA: Diagnosis not present

## 2023-08-31 DIAGNOSIS — R918 Other nonspecific abnormal finding of lung field: Secondary | ICD-10-CM | POA: Diagnosis not present

## 2023-08-31 DIAGNOSIS — C3411 Malignant neoplasm of upper lobe, right bronchus or lung: Secondary | ICD-10-CM | POA: Diagnosis not present

## 2023-08-31 DIAGNOSIS — E119 Type 2 diabetes mellitus without complications: Secondary | ICD-10-CM | POA: Diagnosis not present

## 2023-08-31 DIAGNOSIS — Z79899 Other long term (current) drug therapy: Secondary | ICD-10-CM | POA: Diagnosis not present

## 2023-08-31 DIAGNOSIS — E2740 Unspecified adrenocortical insufficiency: Secondary | ICD-10-CM | POA: Diagnosis not present

## 2023-08-31 DIAGNOSIS — Z87891 Personal history of nicotine dependence: Secondary | ICD-10-CM | POA: Diagnosis not present

## 2023-09-13 DIAGNOSIS — E785 Hyperlipidemia, unspecified: Secondary | ICD-10-CM | POA: Diagnosis not present

## 2023-09-13 DIAGNOSIS — G63 Polyneuropathy in diseases classified elsewhere: Secondary | ICD-10-CM | POA: Diagnosis not present

## 2023-09-13 DIAGNOSIS — G9520 Unspecified cord compression: Secondary | ICD-10-CM | POA: Diagnosis not present

## 2023-09-13 DIAGNOSIS — J9611 Chronic respiratory failure with hypoxia: Secondary | ICD-10-CM | POA: Diagnosis not present

## 2023-09-13 DIAGNOSIS — K7581 Nonalcoholic steatohepatitis (NASH): Secondary | ICD-10-CM | POA: Diagnosis not present

## 2023-09-13 DIAGNOSIS — E1122 Type 2 diabetes mellitus with diabetic chronic kidney disease: Secondary | ICD-10-CM | POA: Diagnosis not present

## 2023-09-13 DIAGNOSIS — E1165 Type 2 diabetes mellitus with hyperglycemia: Secondary | ICD-10-CM | POA: Diagnosis not present

## 2023-09-13 DIAGNOSIS — I480 Paroxysmal atrial fibrillation: Secondary | ICD-10-CM | POA: Diagnosis not present

## 2023-09-13 DIAGNOSIS — N1831 Chronic kidney disease, stage 3a: Secondary | ICD-10-CM | POA: Diagnosis not present

## 2023-09-13 DIAGNOSIS — E559 Vitamin D deficiency, unspecified: Secondary | ICD-10-CM | POA: Diagnosis not present

## 2023-09-13 DIAGNOSIS — D510 Vitamin B12 deficiency anemia due to intrinsic factor deficiency: Secondary | ICD-10-CM | POA: Diagnosis not present

## 2023-09-13 DIAGNOSIS — C799 Secondary malignant neoplasm of unspecified site: Secondary | ICD-10-CM | POA: Diagnosis not present

## 2023-09-19 DIAGNOSIS — H40013 Open angle with borderline findings, low risk, bilateral: Secondary | ICD-10-CM | POA: Diagnosis not present

## 2023-09-19 DIAGNOSIS — H26493 Other secondary cataract, bilateral: Secondary | ICD-10-CM | POA: Diagnosis not present

## 2023-09-19 DIAGNOSIS — H43813 Vitreous degeneration, bilateral: Secondary | ICD-10-CM | POA: Diagnosis not present

## 2023-09-19 DIAGNOSIS — H35372 Puckering of macula, left eye: Secondary | ICD-10-CM | POA: Diagnosis not present

## 2023-09-19 DIAGNOSIS — E113293 Type 2 diabetes mellitus with mild nonproliferative diabetic retinopathy without macular edema, bilateral: Secondary | ICD-10-CM | POA: Diagnosis not present

## 2023-11-08 DIAGNOSIS — E119 Type 2 diabetes mellitus without complications: Secondary | ICD-10-CM | POA: Diagnosis not present

## 2023-11-08 DIAGNOSIS — M792 Neuralgia and neuritis, unspecified: Secondary | ICD-10-CM | POA: Diagnosis not present

## 2023-11-08 DIAGNOSIS — Z515 Encounter for palliative care: Secondary | ICD-10-CM | POA: Diagnosis not present

## 2023-11-08 DIAGNOSIS — Z79899 Other long term (current) drug therapy: Secondary | ICD-10-CM | POA: Diagnosis not present

## 2023-11-08 DIAGNOSIS — G8929 Other chronic pain: Secondary | ICD-10-CM | POA: Diagnosis not present

## 2023-11-19 ENCOUNTER — Other Ambulatory Visit (HOSPITAL_COMMUNITY)
Admission: RE | Admit: 2023-11-19 | Discharge: 2023-11-19 | Disposition: A | Discharge status: Home / Self Care | Source: Ambulatory Visit | Attending: Obstetrics and Gynecology | Admitting: Obstetrics and Gynecology

## 2023-11-19 ENCOUNTER — Ambulatory Visit: Admitting: Obstetrics and Gynecology

## 2023-11-19 ENCOUNTER — Encounter: Payer: Self-pay | Admitting: Obstetrics and Gynecology

## 2023-11-19 VITALS — BP 102/64 | HR 66 | Ht 63.0 in | Wt 253.0 lb

## 2023-11-19 DIAGNOSIS — L292 Pruritus vulvae: Secondary | ICD-10-CM

## 2023-11-19 DIAGNOSIS — Z01419 Encounter for gynecological examination (general) (routine) without abnormal findings: Secondary | ICD-10-CM | POA: Diagnosis not present

## 2023-11-19 MED ORDER — TRIAMCINOLONE ACETONIDE 0.5 % EX OINT: 1.0000 | TOPICAL_OINTMENT | Freq: Every evening | CUTANEOUS | 0 refills | Status: DC

## 2023-11-19 NOTE — Progress Notes (Addendum)
 78 y.o. New GYN presents for AEX.  C/o constant burning and itching, vaginal discharge x 6 months.  PHQ-9=15 Refused referral.

## 2023-11-19 NOTE — Progress Notes (Signed)
   NEW GYNECOLOGY VISIT  Subjective:  ALYZE LAUF is a 78 y.o. menopausal G4P0 w/ hx hysterectomy and complex medical history presenting for vulvar itching  6 months intense vulvar itching. Occurs daily. Will scratch to the point of bleeding. Notes some non-odorous clear discharge, no vaginal bleeding. Tried switching to [employer] without improvement. No new detergents or tight clothing. Nothing in the vagina. Notes some burning with urination if she scratches herself raw. Hasn't looked for skin changes.  Has some UI but is not regularly needing pads  No fevers/chills/abdominal pain.   Objective:   Vitals:   11/19/23 1324  BP: 102/64  Pulse: 66  Weight: 253 lb (114.8 kg)  Height: 5' 3 (1.6 m)    General:  Alert, oriented and cooperative. Patient is in no acute distress.  Skin: Skin is warm and dry. No rash noted.   Cardiovascular: Normal heart rate noted  Respiratory: Normal respiratory effort, no problems with respiration noted  Abdomen: Soft, non-tender, non-distended   Pelvic: Excoriation and skin thickening along inguinal folds and bilateral labia majora. Erythematous changes along entire vulva with skin thickening. Non tender, no crepitus, edema or fluctuance to suspect vulvar cellulitis/abscess.  Exam performed in the presence of a chaperone  Assessment and Plan:  TAMECCA ARTIGA is a 78 y.o. 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  Women's annual routine gynecological examination [Z01.419] -     MM 3D SCREENING MAMMOGRAM BILATERAL BREAST; Future  Return in about 2 weeks (around 12/03/2023) for follow up vulvar itching.  Kieth JAYSON Carolin, MD

## 2023-11-19 NOTE — Patient Instructions (Signed)
 Avoid: - Synthetic underwear - Tight pants - Swim suits, thongs, leotards, leggings for prolonged periods of time - Scented soap/shampoo - Bubble baths - Scented detergents, dryer sheets - Baby wipes - Feminine sprays, douches, powders - Panty liners - Dyed toilet paper - Shaving  Trying swapping out the above for: - Cotton or no underwear - Loose pants, skirts, dresses - Changing out of swimwear, thongs, and workout gear as soon as you're done exercising - Fragrance free soaps (like Dove sensitive skin) - Warm plain water baths - Unscented laundry detergent - Use a bedet or peri bottle to rinse instead of baby wipes - Tampons, cotton pads, cotton period underwear - Undyed toilet paper - Clipping hair

## 2023-11-20 ENCOUNTER — Ambulatory Visit: Payer: Self-pay | Admitting: Obstetrics and Gynecology

## 2023-11-20 LAB — CERVICOVAGINAL ANCILLARY ONLY
Bacterial Vaginitis (gardnerella): NEGATIVE
Candida Glabrata: NEGATIVE
Candida Vaginitis: POSITIVE — AB
Comment: NEGATIVE
Comment: NEGATIVE
Comment: NEGATIVE
Comment: NEGATIVE
Trichomonas: NEGATIVE

## 2023-11-20 MED ORDER — FLUCONAZOLE 150 MG PO TABS
150.0000 mg | ORAL_TABLET | ORAL | 0 refills | Status: DC
Start: 2023-11-20 — End: 2024-02-20

## 2023-11-28 ENCOUNTER — Telehealth: Payer: Self-pay | Admitting: Internal Medicine

## 2023-11-28 NOTE — Telephone Encounter (Signed)
 I spoke with patient.  She reports a 10 lb weight gain in the last month.  Has swelling in her feet, legs and hands. Chronically short of breath.  She missed follow up appointment in January 2025.  Appointment made for patient to see K. Devora, NP on 8/7 at 3:10  Also reports she was told by her pharmacy she now has to pay for diltiazem  and she has not had to pay for this in the past.  Was told it was an insurance issue.  She has refills but has been out of medication for 2 days due to this issue.  She will discuss this at office visit tomorrow.

## 2023-11-28 NOTE — Telephone Encounter (Signed)
  Per MyChart scheduling message:  Pt c/o swelling/edema: STAT if pt has developed SOB within 24 hours  If swelling, where is the swelling located?   How much weight have you gained and in what time span?   Have you gained 2 pounds in a day or 5 pounds in a week?   Do you have a log of your daily weights (if so, list)?   Are you currently taking a fluid pill?   Are you currently SOB?   Have you traveled recently in a car or plane for an extended period of time?   I'm swelling in my feet and legs. I have gained 10 in a month  I don't have a log.im taking 40 mg of fluid pill a day. I'm always short of breath. I have not traveled. I am out of my heart pill it starts with DIL. The drug store said it's a mix up. At dr.office. they are making me pay for The pill and my insurance covers it. The drug store said they faxed the doctor something yesterday.

## 2023-11-28 NOTE — Progress Notes (Deleted)
 Cardiology Office Note    Date:  11/28/2023  ID:  GRACEANNE GUIN, DOB Mar 18, 1946, MRN [id number] PCP:  Cleotilde Planas, MD  Cardiologist:  Stanly DELENA Leavens, MD  Electrophysiologist:  None   Chief Complaint: ***  History of Present Illness: SABRA    DAYANNA PRYCE is a 78 y.o. female with visit-pertinent history of HTN with DM, HLD with DM, CAC and Aortic atherosclerosis, IDA, Morbid Obesity, OSA on COPD previously need home O2, NSCLS s/p Palliative radiation in 2018, prior Ipilumumab & Nivolumab Last in 2019 at [facility], prior malignant tamponade 2018, who established in 2021 for PAF. Patient was started on anticoagulation and was then lost to follow up.   Patient was seen in 2023 with persistent shortness of breath and inability to walk far without becoming winded. Coronary CTA was ordered, indicated coronary calcium  score of 63.1, 55th percentile for age, sex and race matched controls, mild 25-49% stenosis in the LCX and RCA.   She was hospitalized in October 9th to 13th, 2024 during which she was treated with lasix  for fluid overload and diagnosed with congestive HF. Echo indicated LVEF 65 to 70%, no RWMA, mild LVH, GIDD, RV systolic size and function was normal, normal PASP. Mild aortic valve stenosis, regurgitation was not visualized.   Patient was last seen in clinic on 02/26/23 by Dr. Leavens.  Patient reported that she was feeling stronger after her hospitalization however her symptoms had started to recur.  Patient also reported experiencing chest pain at night, was currently on aspirin , diltiazem  and Sinemet  however reported that she was not on a blood thinner despite previous recommendations.  It was noted that patient has history of stage IV lung cancer and super morbid obesity which was likely contributing to her symptoms of shortness of breath.  She is followed at [facility].  Patient's BNP resulted as normal and her kidney function had improved, she was encouraged to start on  Jardiance.  2-week nonlive heart monitor was recommended to assess for atrial fibrillation burden.  Patient had a minimum heart rate of 49 bpm, maximum heart rate of 163 bpm and average heart rate of 84 bpm.  Predominant underlying rhythm was sinus, she had 2 short runs of NSVT less than 6 beats, occasional PVCs at 3.8% burden.  Patient was not restarted on anticoagulation.  When resulting patient's lab work she notified the office that she had been started on Synjardy by her PCP, she was started on spironolactone  12.5 mg daily.  Today she presents for follow-up.  She reports that she  Chronic HF with preserved ejection fraction: Echo on 01/2023 indicated LVEF 65 to 70%, no RWMA, mild LVH, GIDD, RV systolic size and function was normal, normal PASP. Mild aortic valve stenosis, regurgitation was not visualized.  Today she reports   Paroxysmal atrial fibrillation: Patient previously noted to have A-fib in 2021.  Was started on anticoagulation previously however at follow-up visit in 02/2023 reported that she was not taking.  Patient wore cardiac monitor which showed no evidence of atrial fibrillation and was not restarted on anticoagulation.  Today she reports Continue Diltiazem     Hyperlipidemia: LDL 85 on 02/26/23.  Patient encouraged to start rosuvastatin  10 mg daily   CKD stage 3 with hyponatremia:   OSA:   Parkinsons disease:   COPD:   Type 2 DM: Last hemoglobin A1C     Labwork independently reviewed: 08/31/2023: Sodium 137, creatinine 1.4, hemoglobin 1.8, hematocrit 38.4  ROS: .   *** denies chest  pain, shortness of breath, lower extremity edema, fatigue, palpitations, melena, hematuria, hemoptysis, diaphoresis, weakness, presyncope, syncope, orthopnea, and PND.  All other systems are reviewed and otherwise negative.  Studies Reviewed: SABRA    EKG:  EKG is ordered today, personally reviewed, demonstrating ***     CV Studies: Cardiac studies reviewed are outlined and summarized  above. Otherwise please see EMR for full report. Cardiac Studies & Procedures   ______________________________________________________________________________________________   STRESS TESTS  MYOCARDIAL PERFUSION IMAGING 10/08/2002   ECHOCARDIOGRAM  ECHOCARDIOGRAM COMPLETE 02/03/2023  Narrative ECHOCARDIOGRAM REPORT    Patient Name:   KARAH CARUTHERS Date of Exam: 02/03/2023 Medical Rec #:  [id number]          Height:       63.0 in Accession #:    [id number]         Weight:       255.3 lb Date of Birth:  1946/02/23         BSA:          2.145 m Patient Age:    76 years           BP:           129/49 mmHg Patient Gender: F                  HR:           60 bpm. Exam Location:  Inpatient  Procedure: 2D Echo, Cardiac Doppler and Color Doppler  Indications:    CHF  History:        Patient has prior history of Echocardiogram examinations, most recent 09/26/2021. COPD, Signs/Symptoms:Parkinson's, Murmur, Dyspnea, Shortness of Breath and Edema; Risk Factors:Sleep Apnea, Hypertension and Diabetes. Lung CA.  Sonographer:    Clotilda Center Referring Phys: [id number] RIPUDEEP K RAI   Sonographer Comments: Suboptimal subcostal window. Image acquisition challenging due to patient body habitus and Image acquisition challenging due to respiratory motion. IMPRESSIONS   1. Left ventricular ejection fraction, by estimation, is 65 to 70%. The left ventricle has normal function. The left ventricle has no regional wall motion abnormalities. There is mild left ventricular hypertrophy. Left ventricular diastolic parameters are consistent with Grade I diastolic dysfunction (impaired relaxation). 2. Right ventricular systolic function is normal. The right ventricular size is normal. There is normal pulmonary artery systolic pressure. The estimated right ventricular systolic pressure is 30.5 mmHg. 3. The mitral valve is degenerative. Trivial mitral valve regurgitation. No evidence of mitral  stenosis. 4. The aortic valve is abnormal. There is moderate calcification of the aortic valve. Aortic valve regurgitation is not visualized. Mild aortic valve stenosis. Aortic valve area, by VTI measures 2.16 cm. Aortic valve mean gradient measures 16.0 mmHg. Aortic valve Vmax measures 2.49 m/s. 5. The inferior vena cava is normal in size with greater than 50% respiratory variability, suggesting right atrial pressure of 3 mmHg. 6. Increased flow velocities may be secondary to anemia, thyrotoxicosis, hyperdynamic or high flow state.  FINDINGS Left Ventricle: Left ventricular ejection fraction, by estimation, is 65 to 70%. The left ventricle has normal function. The left ventricle has no regional wall motion abnormalities. The left ventricular internal cavity size was normal in size. There is mild left ventricular hypertrophy. Left ventricular diastolic parameters are consistent with Grade I diastolic dysfunction (impaired relaxation).  Right Ventricle: The right ventricular size is normal. No increase in right ventricular wall thickness. Right ventricular systolic function is normal. There is normal pulmonary artery systolic pressure. The tricuspid regurgitant velocity is  2.62 m/s, and with an assumed right atrial pressure of 3 mmHg, the estimated right ventricular systolic pressure is 30.5 mmHg.  Left Atrium: Left atrial size was normal in size.  Right Atrium: Right atrial size was normal in size.  Pericardium: There is no evidence of pericardial effusion.  Mitral Valve: The mitral valve is degenerative in appearance. Mild mitral annular calcification. Trivial mitral valve regurgitation. No evidence of mitral valve stenosis. MV peak gradient, 7.3 mmHg. The mean mitral valve gradient is 3.0 mmHg.  Tricuspid Valve: The tricuspid valve is normal in structure. Tricuspid valve regurgitation is trivial. No evidence of tricuspid stenosis.  Aortic Valve: The aortic valve is abnormal. There is  moderate calcification of the aortic valve. Aortic valve regurgitation is not visualized. Mild aortic stenosis is present. Aortic valve mean gradient measures 16.0 mmHg. Aortic valve peak gradient measures 24.8 mmHg. Aortic valve area, by VTI measures 2.16 cm.  Pulmonic Valve: The pulmonic valve was normal in structure. Pulmonic valve regurgitation is trivial. No evidence of pulmonic stenosis.  Aorta: The aortic root is normal in size and structure.  Venous: The inferior vena cava is normal in size with greater than 50% respiratory variability, suggesting right atrial pressure of 3 mmHg.  IAS/Shunts: The interatrial septum was not well visualized.   LEFT VENTRICLE PLAX 2D LVIDd:         5.00 cm     Diastology LVIDs:         3.60 cm     LV e' medial:    4.57 cm/s LV PW:         1.20 cm     LV E/e' medial:  20.9 LV IVS:        1.20 cm     LV e' lateral:   6.64 cm/s LVOT diam:     2.20 cm     LV E/e' lateral: 14.4 LV SV:         116 LV SV Index:   54 LVOT Area:     3.80 cm  LV Volumes (MOD) LV vol d, MOD A2C: 69.8 ml LV vol d, MOD A4C: 84.6 ml LV vol s, MOD A2C: 24.9 ml LV vol s, MOD A4C: 37.3 ml LV SV MOD A2C:     44.9 ml LV SV MOD A4C:     84.6 ml LV SV MOD BP:      49.4 ml  RIGHT VENTRICLE            IVC RV Basal diam:  3.80 cm    IVC diam: 0.90 cm RV Mid diam:    3.30 cm RV S prime:     9.57 cm/s TAPSE (M-mode): 1.6 cm  LEFT ATRIUM             Index        RIGHT ATRIUM           Index LA diam:        4.00 cm 1.86 cm/m   RA Area:     14.10 cm LA Vol (A2C):   56.8 ml 26.48 ml/m  RA Volume:   30.90 ml  14.41 ml/m LA Vol (A4C):   58.1 ml 27.09 ml/m LA Biplane Vol: 58.6 ml 27.32 ml/m AORTIC VALVE AV Area (Vmax):    1.94 cm AV Area (Vmean):   1.89 cm AV Area (VTI):     2.16 cm AV Vmax:           249.00 cm/s AV Vmean:  191.000 cm/s AV VTI:            0.536 m AV Peak Grad:      24.8 mmHg AV Mean Grad:      16.0 mmHg LVOT Vmax:         127.00 cm/s LVOT  Vmean:        95.000 cm/s LVOT VTI:          0.304 m LVOT/AV VTI ratio: 0.57  AORTA Ao Root diam: 3.10 cm Ao Asc diam:  3.60 cm  MITRAL VALVE                TRICUSPID VALVE MV Area (PHT): 2.56 cm     TR Peak grad:   27.5 mmHg MV Area VTI:   3.00 cm     TR Vmax:        262.00 cm/s MV Peak grad:  7.3 mmHg MV Mean grad:  3.0 mmHg     SHUNTS MV Vmax:       1.35 m/s     Systemic VTI:  0.30 m MV Vmean:      84.4 cm/s    Systemic Diam: 2.20 cm MV Decel Time: 296 msec MV E velocity: 95.70 cm/s MV A velocity: 129.00 cm/s MV E/A ratio:  0.74  Soyla Merck MD Electronically signed by Soyla Merck MD Signature Date/Time: 02/03/2023/2:34:46 PM    Final    MONITORS  LONG TERM MONITOR (3-14 DAYS) 03/20/2023  Narrative   Patient had a minimum heart rate of 49 bpm, maximum heart rate of 163 bpm, and average heart rate of 84 bpm.   Predominant underlying rhythm was sinus.   Two short runs of NSVT less than 6 beats.   Isolated PACs were rare (<1.0%).   Isolated PVCs were occasional (3.8%).   No triggered and diary events.  Occasional PVCs.   CT SCANS  CT CORONARY MORPH W/CTA COR W/SCORE 10/20/2021  Addendum 10/21/2021  2:53 PM ADDENDUM REPORT: 10/21/2021 14:51  EXAM: OVER-READ INTERPRETATION  CT CHEST  The following report is an over-read performed by radiologist Dr. Marcey Diones [facility] on 10/21/2021. This over-read does not include interpretation of cardiac or coronary anatomy or pathology. The coronary CTA interpretation by the cardiologist is attached.  COMPARISON:  Prior CTA of the chest on 06/19/2017  FINDINGS: No significant noncardiac vascular findings. Visualized mediastinum and hilar regions demonstrate no lymphadenopathy or masses. Visualized lungs show no evidence of pulmonary edema, consolidation, pneumothorax, nodule or pleural fluid. Visualized upper abdomen and bony structures are unremarkable.  IMPRESSION: No significant  incidental findings.   Electronically Signed By: Marcey Moan M.D. On: 10/21/2021 14:51  Narrative CLINICAL DATA:  78 yo female with chest pain  EXAM: Cardiac/Coronary CTA  TECHNIQUE: A non-contrast, gated CT scan was obtained with axial slices of 3 mm through the heart for calcium  scoring. Calcium  scoring was performed using the Agatston method. A 120 kV prospective, gated, contrast cardiac scan was obtained. Gantry rotation speed was 250 msecs and collimation was 0.6 mm. Two sublingual nitroglycerin  tablets (0.8 mg) were given. The 3D data set was reconstructed in 5% intervals of the 35-75% of the R-R cycle. Diastolic phases were analyzed on a dedicated workstation using MPR, MIP, and VRT modes. The patient received 95 cc of contrast.  FINDINGS: Image quality: Average.  Noise artifact is: Moderate.  (Mis-registration).  Coronary Arteries:  Normal coronary origin.  Right dominance.  Left main: The left main is a large caliber vessel with a  normal take off from the left coronary cusp that trifurcates into a LAD, LCX, and ramus intermedius. There is minimal (0-24) calcified plaque in the ostium.  Left anterior descending artery: The LAD is has minimal (0-24) soft plaque in the proximal to mid vessel. The LAD gives off 2 patent diagonal branches.  Ramus intermedius: Very small.  Left circumflex artery: The LCX is non-dominant and has mild (25-49) soft plaque in the mid vessel. The LCX gives off 2 patent obtuse marginal branches.  Right coronary artery: The RCA is dominant with normal take off from the right coronary cusp. There is mild (25-49) mixed plaque stenosis in the proximal vessel. The RCA terminates as a PDA and right posterolateral branch without evidence of plaque or stenosis.  Right Atrium: Mildly dilated.  Right Ventricle: The right ventricular cavity is within normal limits.  Left Atrium: Left atrial size is mildly dilated with no left  atrial appendage filling defect.  Left Ventricle: The ventricular cavity size is within normal limits. There are no stigmata of prior infarction. There is no abnormal filling defect.  Pulmonary arteries: Normal in size without proximal filling defect.  Pulmonary veins: Normal pulmonary venous drainage.  Pericardium: Normal thickness with no significant effusion or calcium  present.  Cardiac valves: The aortic valve is trileaflet with calcification. The mitral valve is normal structure without significant calcification.  Aorta: Normal caliber with aortic atherosclerosis noted.  Extra-cardiac findings: See attached radiology report for non-cardiac structures.  IMPRESSION: 1. Coronary calcium  score of 63.1. This was 55 percentile for age-, sex, and race-matched controls.  2. Normal coronary origin with right dominance.  3. Mild (25-49) stenoses in the Lcx and RCA.  4. Aortic atherosclerosis.  5. Calcified aortic valve.  6. Mild biatrial enlargement.  RECOMMENDATIONS: CAD-RADS 2: Mild non-obstructive CAD (25-49%). Consider non-atherosclerotic causes of chest pain. Consider preventive therapy and risk factor modification.  Redell Shallow, MD  Electronically Signed: By: Redell Shallow M.D. On: 10/20/2021 17:11     ______________________________________________________________________________________________       Current Reported Medications:.    No outpatient medications have been marked as taking for the 11/29/23 encounter (Appointment) with Alyona Romack D, NP.    Physical Exam:    VS:  There were no vitals taken for this visit.   Wt Readings from Last 3 Encounters:  11/19/23 253 lb (114.8 kg)  02/26/23 254 lb (115.2 kg)  02/04/23 256 lb 9.9 oz (116.4 kg)    GEN: Well nourished, well developed in no acute distress NECK: No JVD; No carotid bruits CARDIAC: ***RRR, no murmurs, rubs, gallops RESPIRATORY:  Clear to auscultation without rales, wheezing or  rhonchi  ABDOMEN: Soft, non-tender, non-distended EXTREMITIES:  No edema; No acute deformity     Asessement and Plan:.     ***     Disposition: F/u with ***  Signed, Devota Viruet D Afnan Cadiente, NP

## 2023-11-29 ENCOUNTER — Ambulatory Visit: Attending: Cardiology | Admitting: Cardiology

## 2023-11-29 DIAGNOSIS — I5033 Acute on chronic diastolic (congestive) heart failure: Secondary | ICD-10-CM

## 2023-11-29 DIAGNOSIS — I480 Paroxysmal atrial fibrillation: Secondary | ICD-10-CM

## 2023-11-29 DIAGNOSIS — G4733 Obstructive sleep apnea (adult) (pediatric): Secondary | ICD-10-CM

## 2023-11-29 DIAGNOSIS — C3490 Malignant neoplasm of unspecified part of unspecified bronchus or lung: Secondary | ICD-10-CM

## 2023-11-29 DIAGNOSIS — I350 Nonrheumatic aortic (valve) stenosis: Secondary | ICD-10-CM

## 2023-11-29 DIAGNOSIS — C3411 Malignant neoplasm of upper lobe, right bronchus or lung: Secondary | ICD-10-CM | POA: Diagnosis not present

## 2023-11-29 DIAGNOSIS — E119 Type 2 diabetes mellitus without complications: Secondary | ICD-10-CM

## 2023-11-29 DIAGNOSIS — M792 Neuralgia and neuritis, unspecified: Secondary | ICD-10-CM | POA: Diagnosis not present

## 2023-11-29 DIAGNOSIS — Z515 Encounter for palliative care: Secondary | ICD-10-CM | POA: Diagnosis not present

## 2023-11-29 DIAGNOSIS — Z79899 Other long term (current) drug therapy: Secondary | ICD-10-CM | POA: Diagnosis not present

## 2023-12-25 ENCOUNTER — Encounter: Payer: Self-pay | Admitting: Obstetrics and Gynecology

## 2023-12-27 DIAGNOSIS — Z515 Encounter for palliative care: Secondary | ICD-10-CM | POA: Diagnosis not present

## 2023-12-27 DIAGNOSIS — Z79899 Other long term (current) drug therapy: Secondary | ICD-10-CM | POA: Diagnosis not present

## 2023-12-27 DIAGNOSIS — C3411 Malignant neoplasm of upper lobe, right bronchus or lung: Secondary | ICD-10-CM | POA: Diagnosis not present

## 2023-12-27 DIAGNOSIS — R52 Pain, unspecified: Secondary | ICD-10-CM | POA: Diagnosis not present

## 2024-01-09 DIAGNOSIS — M1712 Unilateral primary osteoarthritis, left knee: Secondary | ICD-10-CM | POA: Diagnosis not present

## 2024-01-09 DIAGNOSIS — M19012 Primary osteoarthritis, left shoulder: Secondary | ICD-10-CM | POA: Diagnosis not present

## 2024-01-23 ENCOUNTER — Other Ambulatory Visit: Payer: Self-pay

## 2024-01-23 MED ORDER — TRIAMCINOLONE ACETONIDE 0.5 % EX OINT: 1.0000 | TOPICAL_OINTMENT | Freq: Every evening | CUTANEOUS | 0 refills | Status: AC

## 2024-01-31 DIAGNOSIS — G8929 Other chronic pain: Secondary | ICD-10-CM | POA: Diagnosis not present

## 2024-01-31 DIAGNOSIS — Z515 Encounter for palliative care: Secondary | ICD-10-CM | POA: Diagnosis not present

## 2024-01-31 DIAGNOSIS — M5442 Lumbago with sciatica, left side: Secondary | ICD-10-CM | POA: Diagnosis not present

## 2024-02-06 DIAGNOSIS — Z23 Encounter for immunization: Secondary | ICD-10-CM | POA: Diagnosis not present

## 2024-02-06 DIAGNOSIS — Z0000 Encounter for general adult medical examination without abnormal findings: Secondary | ICD-10-CM | POA: Diagnosis not present

## 2024-02-14 DIAGNOSIS — E559 Vitamin D deficiency, unspecified: Secondary | ICD-10-CM | POA: Diagnosis not present

## 2024-02-14 DIAGNOSIS — K7460 Unspecified cirrhosis of liver: Secondary | ICD-10-CM | POA: Diagnosis not present

## 2024-02-14 DIAGNOSIS — D510 Vitamin B12 deficiency anemia due to intrinsic factor deficiency: Secondary | ICD-10-CM | POA: Diagnosis not present

## 2024-02-14 DIAGNOSIS — Z794 Long term (current) use of insulin: Secondary | ICD-10-CM | POA: Diagnosis not present

## 2024-02-14 DIAGNOSIS — N1831 Chronic kidney disease, stage 3a: Secondary | ICD-10-CM | POA: Diagnosis not present

## 2024-02-14 DIAGNOSIS — M791 Myalgia, unspecified site: Secondary | ICD-10-CM | POA: Diagnosis not present

## 2024-02-14 DIAGNOSIS — C799 Secondary malignant neoplasm of unspecified site: Secondary | ICD-10-CM | POA: Diagnosis not present

## 2024-02-14 DIAGNOSIS — D8481 Immunodeficiency due to conditions classified elsewhere: Secondary | ICD-10-CM | POA: Diagnosis not present

## 2024-02-14 DIAGNOSIS — J9611 Chronic respiratory failure with hypoxia: Secondary | ICD-10-CM | POA: Diagnosis not present

## 2024-02-14 DIAGNOSIS — E1165 Type 2 diabetes mellitus with hyperglycemia: Secondary | ICD-10-CM | POA: Diagnosis not present

## 2024-02-14 DIAGNOSIS — E785 Hyperlipidemia, unspecified: Secondary | ICD-10-CM | POA: Diagnosis not present

## 2024-02-14 DIAGNOSIS — J449 Chronic obstructive pulmonary disease, unspecified: Secondary | ICD-10-CM | POA: Diagnosis not present

## 2024-02-14 DIAGNOSIS — G629 Polyneuropathy, unspecified: Secondary | ICD-10-CM | POA: Diagnosis not present

## 2024-02-14 DIAGNOSIS — Z0000 Encounter for general adult medical examination without abnormal findings: Secondary | ICD-10-CM | POA: Diagnosis not present

## 2024-02-14 DIAGNOSIS — I480 Paroxysmal atrial fibrillation: Secondary | ICD-10-CM | POA: Diagnosis not present

## 2024-02-19 ENCOUNTER — Ambulatory Visit: Admitting: Obstetrics and Gynecology

## 2024-02-19 ENCOUNTER — Other Ambulatory Visit (HOSPITAL_COMMUNITY)
Admission: RE | Admit: 2024-02-19 | Discharge: 2024-02-19 | Disposition: A | Discharge status: Home / Self Care | Source: Ambulatory Visit | Attending: Obstetrics and Gynecology | Admitting: Obstetrics and Gynecology

## 2024-02-19 ENCOUNTER — Encounter: Payer: Self-pay | Admitting: Obstetrics and Gynecology

## 2024-02-19 VITALS — BP 122/68 | HR 62 | Ht 63.0 in | Wt 242.9 lb

## 2024-02-19 DIAGNOSIS — L280 Lichen simplex chronicus: Secondary | ICD-10-CM | POA: Insufficient documentation

## 2024-02-19 DIAGNOSIS — L292 Pruritus vulvae: Secondary | ICD-10-CM

## 2024-02-19 MED ORDER — TRIAMCINOLONE ACETONIDE 0.5 % EX OINT
1.0000 | TOPICAL_OINTMENT | Freq: Every evening | CUTANEOUS | 0 refills | Status: AC
Start: 2024-02-19 — End: ?

## 2024-02-19 NOTE — Progress Notes (Signed)
   RETURN GYNECOLOGY VISIT  Subjective:  [name] is a 78 y.o. G4P0 presenting for vulvar itching/suspected lichen simplex chronicus  Nearly 1 year of intense vulvar itching. Initially saw patient 7/28 with these concerns, see note for HPI. Exam c/f lichen simplex chronicus. Swab at that visit c/w VVC, so we sent treatment with diflucan  as well as steroid ointment to use nightly for 2 weeks. Planned for follow up within 2 weeks but she was lost to follow up until her symptoms worsened.   Today, reports ointment initially helped but did not notice a difference with the diflucan . Still having daily itching/scratching  Hx lung cancer and T2DM. Hx TVH.  Objective:   Vitals:   02/19/24 1132  BP: 122/68  Pulse: 62  Weight: 242 lb 14.4 oz (110.2 kg)  Height: 5' 3 (1.6 m)    General:  Alert, oriented and cooperative. Patient is in no acute distress.  Skin: Skin is warm and dry. No rash noted.   Cardiovascular: Normal heart rate noted  Respiratory: Normal respiratory effort, no problems with respiration noted  Abdomen: Soft, non-tender, non-distended   Pelvic: Skin thickening and erythema of the bilateral labia majora. Looks improved from last exam, but photos added to chart today. No e/o cellulitis or abscess.  Exam performed in the presence of a chaperone  Assessment and Plan:  [name] is a 78 y.o. with vulvar itching and suspected lichen simplex chronicus  Lichen simplex chronicus Vulvar itching Photo added to chart Will assess for untreated VVC and treat with diflucan  q72h x 3 if swab is positive Trial nightly steroid ointment until follow up with me in 2-3 weeks. If no significant improvement, will obtain biopsy. No suspicious lesions on exam for biopsy today Reviewed risk of vulvar SCC with chronic itching/scratching so may need chronic topical steroid to keep scratching at bay Emphasized need for close interval follow up -     triamcinolone  ointment  (KENALOG ) 0.5 %; Apply 1 Application topically at bedtime. -     Cervicovaginal ancillary only( [place])  Return in about 2 weeks (around 03/04/2024) for follow up itching.  No future appointments.  Kieth JAYSON Carolin, MD

## 2024-02-19 NOTE — Progress Notes (Signed)
 Pt presents for f/u. Pt states that the pills did not work and the cream worked some. Pt is still having itching and irritation.

## 2024-02-19 NOTE — Patient Instructions (Signed)
 Keep using the ointment every night until your next appointment with me  If you have a yeast infection on your swab today, I will send you a message and pills to take every 3 days for about 1 week.

## 2024-02-20 ENCOUNTER — Ambulatory Visit: Payer: Self-pay | Admitting: Obstetrics and Gynecology

## 2024-02-20 LAB — CERVICOVAGINAL ANCILLARY ONLY
Candida Glabrata: NEGATIVE
Candida Vaginitis: POSITIVE — AB
Comment: NEGATIVE
Comment: NEGATIVE
Comment: NEGATIVE
Trichomonas: NEGATIVE

## 2024-02-20 MED ORDER — FLUCONAZOLE 150 MG PO TABS: 150.0000 mg | ORAL_TABLET | ORAL | 0 refills | Status: DC

## 2024-02-24 ENCOUNTER — Other Ambulatory Visit: Payer: Self-pay | Admitting: Internal Medicine

## 2024-02-26 ENCOUNTER — Other Ambulatory Visit: Payer: Self-pay | Admitting: Internal Medicine

## 2024-02-28 ENCOUNTER — Telehealth: Payer: Self-pay | Admitting: Internal Medicine

## 2024-02-28 ENCOUNTER — Other Ambulatory Visit: Payer: Self-pay | Admitting: Internal Medicine

## 2024-02-28 DIAGNOSIS — C3490 Malignant neoplasm of unspecified part of unspecified bronchus or lung: Secondary | ICD-10-CM

## 2024-02-28 DIAGNOSIS — G4733 Obstructive sleep apnea (adult) (pediatric): Secondary | ICD-10-CM

## 2024-02-28 DIAGNOSIS — D509 Iron deficiency anemia, unspecified: Secondary | ICD-10-CM

## 2024-02-28 DIAGNOSIS — I480 Paroxysmal atrial fibrillation: Secondary | ICD-10-CM

## 2024-02-28 DIAGNOSIS — I3139 Other pericardial effusion (noninflammatory): Secondary | ICD-10-CM

## 2024-02-28 DIAGNOSIS — E119 Type 2 diabetes mellitus without complications: Secondary | ICD-10-CM

## 2024-02-28 MED ORDER — DILTIAZEM HCL ER COATED BEADS 120 MG PO CP24: 120.0000 mg | ORAL_CAPSULE | Freq: Every day | ORAL | 0 refills | Status: DC

## 2024-02-28 NOTE — Telephone Encounter (Signed)
*  STAT* If patient is at the pharmacy, call can be transferred to refill team.   1. Which medications need to be refilled? (please list name of each medication and dose if known) diltiazem  (CARDIZEM  CD) 120 MG 24 hr capsule   2. Which pharmacy/location (including street and city if local pharmacy) is medication to be sent to?  [id number] - [place], [place] - [address] AT [facility] [address]      3. Do they need a 30 day or 90 day supply? 90 day    Pt is out medication

## 2024-02-28 NOTE — Telephone Encounter (Signed)
 30 day refill has been sent to [facility].  Asked pt to call and make an appt.

## 2024-03-11 ENCOUNTER — Ambulatory Visit: Admitting: Obstetrics and Gynecology

## 2024-03-24 ENCOUNTER — Telehealth: Payer: Self-pay | Admitting: Internal Medicine

## 2024-03-24 ENCOUNTER — Other Ambulatory Visit: Payer: Self-pay | Admitting: Internal Medicine

## 2024-03-24 DIAGNOSIS — E119 Type 2 diabetes mellitus without complications: Secondary | ICD-10-CM

## 2024-03-24 DIAGNOSIS — I480 Paroxysmal atrial fibrillation: Secondary | ICD-10-CM

## 2024-03-24 DIAGNOSIS — D509 Iron deficiency anemia, unspecified: Secondary | ICD-10-CM

## 2024-03-24 DIAGNOSIS — I3139 Other pericardial effusion (noninflammatory): Secondary | ICD-10-CM

## 2024-03-24 DIAGNOSIS — G4733 Obstructive sleep apnea (adult) (pediatric): Secondary | ICD-10-CM

## 2024-03-24 DIAGNOSIS — C3490 Malignant neoplasm of unspecified part of unspecified bronchus or lung: Secondary | ICD-10-CM

## 2024-03-24 NOTE — Telephone Encounter (Signed)
  1. Which medications need to be refilled? (please list name of each medication and dose if known) rosuvastatin  (CRESTOR ) 10 MG tablet   diltiazem  (CARDIZEM  CD) 120 MG 24 hr capsule    2. Would you like to learn more about the convenience, safety, & potential cost savings by using the [facility]? no     3. Are you open to using the [facility] (Type [facility]. no   4. Which pharmacy/location (including street and city if local pharmacy) is medication to be sent to? [id number] - [place], [place] - [address] AT [facility] OF [address]    5. Do they need a 30 day or 90 day supply? 90 days

## 2024-03-25 MED ORDER — ROSUVASTATIN CALCIUM 10 MG PO TABS: 10.0000 mg | ORAL_TABLET | Freq: Every day | ORAL | 0 refills | Status: AC

## 2024-03-25 MED ORDER — DILTIAZEM HCL ER COATED BEADS 120 MG PO CP24: 120.0000 mg | ORAL_CAPSULE | Freq: Every day | ORAL | 0 refills | Status: AC

## 2024-03-25 NOTE — Telephone Encounter (Signed)
 Pt scheduled to see Dr. Santo in March 2026.  90 day refill have been sent.

## 2024-04-11 ENCOUNTER — Encounter (HOSPITAL_COMMUNITY): Payer: Self-pay

## 2024-04-11 ENCOUNTER — Emergency Department (HOSPITAL_COMMUNITY)

## 2024-04-11 ENCOUNTER — Other Ambulatory Visit: Payer: Self-pay

## 2024-04-11 ENCOUNTER — Emergency Department (HOSPITAL_COMMUNITY)
Admission: EM | Admit: 2024-04-11 | Discharge: 2024-04-11 | Disposition: A | Discharge status: Home / Self Care | Attending: Emergency Medicine | Admitting: Emergency Medicine

## 2024-04-11 DIAGNOSIS — R5381 Other malaise: Secondary | ICD-10-CM | POA: Insufficient documentation

## 2024-04-11 DIAGNOSIS — Z79899 Other long term (current) drug therapy: Secondary | ICD-10-CM | POA: Insufficient documentation

## 2024-04-11 DIAGNOSIS — R0602 Shortness of breath: Secondary | ICD-10-CM | POA: Insufficient documentation

## 2024-04-11 DIAGNOSIS — R059 Cough, unspecified: Secondary | ICD-10-CM | POA: Diagnosis not present

## 2024-04-11 DIAGNOSIS — R0781 Pleurodynia: Secondary | ICD-10-CM | POA: Diagnosis not present

## 2024-04-11 DIAGNOSIS — Z85830 Personal history of malignant neoplasm of bone: Secondary | ICD-10-CM | POA: Diagnosis not present

## 2024-04-11 DIAGNOSIS — Z85118 Personal history of other malignant neoplasm of bronchus and lung: Secondary | ICD-10-CM | POA: Diagnosis not present

## 2024-04-11 DIAGNOSIS — Z794 Long term (current) use of insulin: Secondary | ICD-10-CM | POA: Diagnosis not present

## 2024-04-11 DIAGNOSIS — R079 Chest pain, unspecified: Secondary | ICD-10-CM

## 2024-04-11 LAB — BASIC METABOLIC PANEL WITH GFR
Anion gap: 12 (ref 5–15)
BUN: 18 mg/dL (ref 8–23)
CO2: 26 mmol/L (ref 22–32)
Calcium: 9.4 mg/dL (ref 8.9–10.3)
Chloride: 102 mmol/L (ref 98–111)
Creatinine, Ser: 1.07 mg/dL — ABNORMAL HIGH (ref 0.44–1.00)
GFR, Estimated: 53 mL/min — ABNORMAL LOW
Glucose, Bld: 213 mg/dL — ABNORMAL HIGH (ref 70–99)
Potassium: 4 mmol/L (ref 3.5–5.1)
Sodium: 139 mmol/L (ref 135–145)

## 2024-04-11 LAB — RESP PANEL BY RT-PCR (RSV, FLU A&B, COVID)  RVPGX2
Influenza A by PCR: NEGATIVE
Influenza B by PCR: NEGATIVE
Resp Syncytial Virus by PCR: NEGATIVE
SARS Coronavirus 2 by RT PCR: NEGATIVE

## 2024-04-11 LAB — CBC
HCT: 40.9 % (ref 36.0–46.0)
Hemoglobin: 13.3 g/dL (ref 12.0–15.0)
MCH: 27.5 pg (ref 26.0–34.0)
MCHC: 32.5 g/dL (ref 30.0–36.0)
MCV: 84.5 fL (ref 80.0–100.0)
Platelets: 234 K/uL (ref 150–400)
RBC: 4.84 MIL/uL (ref 3.87–5.11)
RDW: 14.3 % (ref 11.5–15.5)
WBC: 8.8 K/uL (ref 4.0–10.5)
nRBC: 0 % (ref 0.0–0.2)

## 2024-04-11 LAB — TROPONIN T, HIGH SENSITIVITY
Troponin T High Sensitivity: 22 ng/L — ABNORMAL HIGH (ref 0–19)
Troponin T High Sensitivity: 20 ng/L — ABNORMAL HIGH (ref 0–19)

## 2024-04-11 MED ORDER — IOHEXOL 350 MG/ML SOLN
75.0000 mL | Freq: Once | INTRAVENOUS | Status: AC | PRN
  Administered 2024-04-11: 75 mL via INTRAVENOUS

## 2024-04-11 NOTE — ED Triage Notes (Addendum)
 Patient has centralized chest pain that began this morning at 7am. Pain moves to her upper back. Feels nauseous. Unable to catch her breath. Has lung and bone cancer. Finished radiation.

## 2024-04-11 NOTE — Discharge Instructions (Signed)
 You were seen in the emergency department for your chest pain.  Your workup showed no signs of heart attack, no blood clots and you tested negative for COVID, flu and RSV and no signs of pneumonia.  You could have another viral infection, this may be cancer related pain or acid reflux pain.  You should continue to take your antacid medication daily and your home pain medications.  You should follow-up with your primary doctor and your oncologist for reassessment.  You should return to the emergency department if the pain becomes significantly more severe, you have worsening shortness of breath, you have fevers or any other new or concerning symptoms.

## 2024-04-11 NOTE — ED Provider Notes (Incomplete)
 " [place] EMERGENCY DEPARTMENT AT [facility] Provider Note   CSN: [id number] Arrival date & time: 04/11/24  1246     Patient presents with: Chest Pain   [name] is a 78 y.o. female.  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  States that she woke this morning and has had constant pain in her chest which is worse with coughing and deep breathing.  Notes some increasing dyspnea on exertion.  Denies any new leg pain or swelling.  Is not currently receiving any cancer treatment at this time.  Denies any palpitations or near syncope.       Prior to Admission medications  Medication Sig Start Date End Date Taking? Authorizing Provider  albuterol  (PROVENTIL ) (2.5 MG/3ML) 0.083% nebulizer solution Take 3 mLs (2.5 mg total) by nebulization every 6 (six) hours as needed for wheezing or shortness of breath. 03/17/20   Meade Verdon RAMAN, MD  albuterol  (VENTOLIN  HFA) 108 [phone] Base) MCG/ACT inhaler Inhale 2 puffs into the lungs every 6 (six) hours as needed for wheezing or shortness of breath. 06/14/20   Meade Verdon RAMAN, MD  ALPRAZolam  (XANAX ) 1 MG tablet Take 1 tablet (1 mg total) 2 (two) times daily as needed by mouth for anxiety. 03/05/17   Gold, Lemond BRAVO, PA-C  aspirin  81 MG chewable tablet Chew 81-162 mg by mouth as needed (for chest pain).    [provider]  carbidopa -levodopa  (SINEMET  IR) 25-100 MG tablet Take 1 tablet by mouth in the morning and at bedtime. 02/04/23   Rai, Ripudeep MARLA, MD  Cholecalciferol  (VITAMIN D3) 125 MCG (5000 UT) CAPS Take 5,000 Units by mouth daily.    [provider]  diltiazem  (CARDIZEM  CD) 120 MG 24 hr capsule Take 1 capsule (120 mg total) by mouth daily. 03/25/24   Chandrasekhar, Stanly LABOR, MD  fentaNYL  (DURAGESIC  -  DOSED MCG/HR) 100 MCG/HR Place 100 mcg onto the skin every other day. 02/21/17   [provider]  fentaNYL  (DURAGESIC ) 25 MCG/HR Place 1 patch onto the skin every other day. 07/26/20   [provider]  fluconazole  (DIFLUCAN ) 150 MG tablet Take 1 tablet (150 mg total) by mouth every 3 (three) days. For three doses 02/20/24   Erik Kieth BROCKS, MD  fluticasone  [facility]) 50 MCG/ACT nasal spray Place 1-2 sprays into both nostrils daily as needed for allergies or rhinitis. 07/01/21   [provider]  furosemide  (LASIX ) 40 MG tablet Take 1 tablet (40 mg total) by mouth daily. 02/04/23   Rai, Nydia MARLA, MD  HUMALOG  KWIKPEN 100 UNIT/ML KiwkPen Inject 0.12 mLs (12 Units total) into the skin 3 (three) times daily before meals. Along with your normal sliding scale. 06/20/17   Amin, Sumayya, MD  hydrocortisone  (CORTEF ) 5 MG tablet Take 10-15 mg by mouth See admin instructions. Take 15 mg by mouth in the morning and 10 mg at bedtime 03/15/19   [provider]  insulin  glargine (LANTUS ) 100 UNIT/ML injection Inject 40-60 Units into the skin See admin instructions. Inject 60 units into the skin in the morning and 40 units at bedtime    [provider]  nitroGLYCERIN  (NITROSTAT ) 0.4 MG SL tablet PLACE AND DISSOLVE ONE TABLET UNDER TONGUE AS NEEDED FOR CHEST PAIN( EVERY 5 MINUTES, MAY TAKE UP TO 3 TIMES DAILY) 11/01/21  Chandrasekhar, Mahesh A, MD  omeprazole  (PRILOSEC) 20 MG capsule Take 20 mg by mouth daily as needed (acid reflux). 05/04/19   [provider]  ondansetron  (ZOFRAN ) 8 MG tablet Take 8 mg by mouth every 8 (eight) hours as needed for nausea or vomiting.    [provider]  Oxycodone  HCl 20 MG TABS Take 20 mg by mouth every 4 (four) hours as needed (for pain).    [provider]  pregabalin  (LYRICA ) 50 MG capsule Take 50 mg by mouth in the morning. 02/05/20   [provider]  pregabalin  (LYRICA ) 75 MG capsule Take 75 mg by mouth  at bedtime. 02/05/20   [provider]  PRESCRIPTION MEDICATION CPAP- At bedtime    [provider]  promethazine (PHENERGAN) 12.5 MG tablet Take 12.5 mg by mouth every 6 (six) hours as needed for nausea or vomiting. Patient not taking: Reported on 02/19/2024 09/19/18   [provider]  rosuvastatin  (CRESTOR ) 10 MG tablet Take 1 tablet (10 mg total) by mouth daily. 03/25/24   Chandrasekhar, Stanly LABOR, MD  sertraline  (ZOLOFT ) 100 MG tablet Take 100 mg by mouth daily. 06/29/13   [provider]  spironolactone  (ALDACTONE ) 25 MG tablet Take 0.5 tablets (12.5 mg total) by mouth daily. 03/08/23   Chandrasekhar, Mahesh A, MD  SYNJARDY XR 01-999 MG TB24 Take 1 tablet by mouth every morning. 02/12/23   [provider]  TRELEGY ELLIPTA  100-62.5-25 MCG/INH AEPB Take 1 puff by mouth daily. 07/09/20   Desai, Nikita S, MD  triamcinolone  ointment (KENALOG ) 0.5 % Apply 1 Application topically at bedtime. 02/19/24   Erik Kieth BROCKS, MD    Allergies: Ozempic (0.25 or 0.5 mg-dose) [semaglutide(0.25 or 0.5mg -dos)], Morphine  and codeine, and Tape    Review of Systems  All other systems reviewed and are negative.   Updated Vital Signs BP (!) 140/78   Pulse 88   Temp 98.5 F (36.9 C) (Oral)   Resp (!) 22   Ht 1.626 m (5' 4)   Wt 108.9 kg   SpO2 92%   BMI 41.20 kg/m   Physical Exam Vitals and nursing note reviewed.  Constitutional:      General: She is not in acute distress.    Appearance: Normal appearance. She is well-developed. She is not toxic-appearing.  HENT:     Head: Normocephalic and atraumatic.  Eyes:     General: Lids are normal.     Conjunctiva/sclera: Conjunctivae normal.     Pupils: Pupils are equal, round, and reactive to light.  Neck:     Thyroid : No thyroid  mass.     Trachea: No tracheal deviation.  Cardiovascular:     Rate and Rhythm: Normal rate and regular rhythm.     Heart sounds: Normal heart sounds. No murmur heard.    No  gallop.  Pulmonary:     Effort: Pulmonary effort is normal. No respiratory distress.     Breath sounds: Normal breath sounds. No stridor. No decreased breath sounds, wheezing, rhonchi or rales.  Abdominal:     General: There is no distension.     Palpations: Abdomen is soft.     Tenderness: There is no abdominal tenderness. There is no rebound.  Musculoskeletal:        General: No tenderness. Normal range of motion.     Cervical back: Normal range of motion and neck supple.  Skin:    General: Skin is warm and dry.     Findings: No abrasion or rash.  Neurological:     Mental Status: She is alert and oriented to person, place, and time. Mental status is at baseline.     GCS: GCS eye subscore is 4. GCS verbal subscore is 5. GCS motor subscore is 6.     Cranial Nerves: No cranial nerve deficit.     Sensory: No sensory deficit.     Motor: Motor function is intact.  Psychiatric:        Attention and Perception: Attention normal.        Speech: Speech normal.        Behavior: Behavior normal.     (all labs ordered are listed, but only abnormal results are displayed) Labs Reviewed  BASIC METABOLIC PANEL WITH GFR - Abnormal; Notable for the following components:      Result Value   Glucose, Bld 213 (*)    Creatinine, Ser 1.07 (*)    GFR, Estimated 53 (*)    All other components within normal limits  TROPONIN T, HIGH SENSITIVITY - Abnormal; Notable for the following components:   Troponin T High Sensitivity 22 (*)    All other components within normal limits  CBC    EKG: EKG Interpretation Date/Time:  Friday April 11 2024 12:52:56 EST Ventricular Rate:  87 PR Interval:    QRS Duration:  140 QT Interval:  398 QTC Calculation: 479 R Axis:   21  Text Interpretation: Right bundle branch block Baseline wander in lead(s) III aVF Confirmed by Dasie Faden ([id number]) on 04/11/2024 1:34:45 PM  Radiology: No results found.  {Document cardiac monitor, telemetry assessment  procedure when appropriate:32947} Procedures   Medications Ordered in the ED - No data to display    {Click here for ABCD2, HEART and other calculators REFRESH Note before signing:1}                              Medical Decision Making Amount and/or Complexity of Data Reviewed Labs: ordered. Radiology: ordered.  Risk Prescription drug management.   ***  {Document critical care time when appropriate  Document review of labs and clinical decision tools ie CHADS2VASC2, etc  Document your independent review of radiology images and any outside records  Document your discussion with family members, caretakers and with consultants  Document social determinants of health affecting pt's care  Document your decision making why or why not admission, treatments were needed:32947:::1}   Final diagnoses:  None    ED Discharge Orders     None        "

## 2024-04-11 NOTE — ED Provider Notes (Signed)
 Patient signed out to me at 1500 by Dr. Dasie pending CT read and repeat troponin.  In short this is a 78 year old female with past medical history of lung cancer with metastases presenting to the emergency department with chest pain.  She reported shortness of breath and mild cough for the last day.  Patient was hemodynamically stable on arrival in no acute distress.  The patient's workup showed a mildly elevated troponin, flat on repeat, no acute ischemic changes on EKG.  Viral swab and labs are otherwise within normal range.  Patient CT PE imaging that showed no evidence of PE or other acute abnormality.  Did show lung nodules consistent with her known lung cancer.  On my evaluation, patient is awake and alert resting in bed comfortably and reports improvement of her pain.  Patient may have viral syndrome versus cancer related pain versus GERD.  She states that she is taking a PPI.  Recommended to follow-up with her outpatient primary care doctor and oncologist.  She was given strict return precautions.   Kingsley, Kairie Vangieson K, DO 04/11/24 [phone]

## 2024-04-11 NOTE — ED Notes (Signed)
 Rainbow collected and sent to lab

## 2024-04-21 ENCOUNTER — Encounter (HOSPITAL_BASED_OUTPATIENT_CLINIC_OR_DEPARTMENT_OTHER): Payer: Self-pay

## 2024-04-22 ENCOUNTER — Ambulatory Visit

## 2024-04-22 ENCOUNTER — Ambulatory Visit: Attending: Internal Medicine | Admitting: Internal Medicine

## 2024-04-22 DIAGNOSIS — I3139 Other pericardial effusion (noninflammatory): Secondary | ICD-10-CM | POA: Diagnosis not present

## 2024-04-22 DIAGNOSIS — I493 Ventricular premature depolarization: Secondary | ICD-10-CM

## 2024-04-22 DIAGNOSIS — I480 Paroxysmal atrial fibrillation: Secondary | ICD-10-CM

## 2024-04-22 DIAGNOSIS — F411 Generalized anxiety disorder: Secondary | ICD-10-CM

## 2024-04-22 MED ORDER — APIXABAN 5 MG PO TABS: 5.0000 mg | ORAL_TABLET | Freq: Two times a day (BID) | ORAL | 1 refills | Status: AC

## 2024-04-22 NOTE — Patient Instructions (Addendum)
 Medication Instructions:  Your physician has recommended you make the following change in your medication:  STOP ASPIRIN  START ELIQUIS 5 MG BY MOUTH TWICE DAILY  *If you need a refill on your cardiac medications before your next appointment, please call your pharmacy*  Lab Work: CBC IN 2 WEEKS AT ANY [facility] If you have labs (blood work) drawn today and your tests are completely normal, you will receive your results only by: MyChart Message (if you have MyChart) OR A paper copy in the mail If you have any lab test that is abnormal or we need to change your treatment, we will call you to review the results.  Testing/Procedures: YOUR PROVIDER HAS REQUESTED THAT YOU WEAR A HEART MONITOR   Follow-Up: At [facility], you and your health needs are our priority.  As part of our continuing mission to provide you with exceptional heart care, our providers are all part of one team.  This team includes your primary Cardiologist (physician) and Advanced Practice Providers or APPs (Physician Assistants and Nurse Practitioners) who all work together to provide you with the care you need, when you need it.  Your next appointment:   2-3 month(s)  Provider:   Stanly DELENA Leavens, MD or One of our Advanced Practice Providers (APPs): Morse Clause, PA-C  Lamarr Satterfield, NP Miriam Shams, NP  Olivia Pavy, PA-C Josefa Beauvais, NP  Leontine Salen, PA-C Orren Fabry, PA-C  Hao Meng, PA-C Ernest Dick, NP  Damien Braver, NP Jon Hails, PA-C  Waddell Donath, PA-C    Dayna Dunn, PA-C  Scott Weaver, PA-C Lum Louis, NP Katlyn West, NP Callie Goodrich, PA-C  Xika Zhao, NP Sheng Haley, PA-C    Kathleen Leo Weyandt, PA-C     We recommend signing up for the patient portal called MyChart.  Sign up information is provided on this After Visit Summary.  MyChart is used to connect with patients for Virtual Visits (Telemedicine).  Patients are able to view lab/test results, encounter notes,  upcoming appointments, etc.  Non-urgent messages can be sent to your provider as well.   To learn more about what you can do with MyChart, go to [email or web address].   Other Instructions ZIO XT- Long Term Monitor Instructions  Your physician has requested you wear a ZIO patch monitor for 3 days.   This is a single patch monitor. [employer] supplies one patch monitor per enrollment. Additional  stickers are not available. Please do not apply patch if you will be having a Nuclear Stress Test,  Echocardiogram, Cardiac CT, MRI, or Chest Xray during the period you would be wearing the  monitor. The patch cannot be worn during these tests. You cannot remove and re-apply the  ZIO XT patch monitor.   Your ZIO patch monitor will be mailed 3 day USPS to your address on file. It may take 3-5 days  to receive your monitor after you have been enrolled.   Once you have received your monitor, please review the enclosed instructions. Your monitor  has already been registered assigning a specific monitor serial # to you.     Billing and Patient Assistance Program Information  We have supplied [employer] with any of your insurance information on file for billing purposes.  [employer] offers a sliding scale Patient Assistance Program for patients that do not have  insurance, or whose insurance does not completely cover the cost of the ZIO monitor.  You must apply for the Patient Assistance Program to qualify for this discounted rate.  To apply, please call [employer] at [phone], select option 4, select option 2, ask to apply for  Patient Assistance Program. Meredeth will ask your household income, and how many people  are in your household. They will quote your out-of-pocket cost based on that information.  [employer] will also be able to set up a 30-month, interest-free payment plan if needed.     Applying the monitor  Shave hair from upper left chest.  Hold abrader disc by orange tab. Rub abrader in 40  strokes over the upper left chest as  indicated in your monitor instructions.  Clean area with 4 enclosed alcohol pads. Let dry.  Apply patch as indicated in monitor instructions. Patch will be placed under collarbone on left  side of chest with arrow pointing upward.  Rub patch adhesive wings for 2 minutes. Remove white label marked 1. Remove the white  label marked 2. Rub patch adhesive wings for 2 additional minutes.  While looking in a mirror, press and release button in center of patch. A small green light will  flash 3-4 times. This will be your only indicator that the monitor has been turned on.  Do not shower for the first 24 hours. You may shower after the first 24 hours.  Press the button if you feel a symptom. You will hear a small click. Record Date, Time and  Symptom in the Patient Logbook.  When you are ready to remove the patch, follow instructions on the last 2 pages of Patient  Logbook. Stick patch monitor onto the last page of Patient Logbook.   Place Patient Logbook in the blue and white box. Use locking tab on box and tape box closed  securely. The blue and white box has prepaid postage on it. Please place it in the mailbox as  soon as possible. Your physician should have your test results approximately 7 days after the  monitor has been mailed back to [facility].   Call [facility] Customer Care at [phone] if you have questions regarding  your ZIO XT patch monitor. Call them immediately if you see an orange light blinking on your  monitor.   If your monitor falls off in less than 4 days, contact our Monitor department at [phone].   If your monitor becomes loose or falls off after 4 days call [employer] at [phone] for  suggestions on securing your monitor.

## 2024-04-22 NOTE — Progress Notes (Unsigned)
 Enrolled patient for a 3 day Zio XT monitor to be mailed to patients home

## 2024-04-22 NOTE — Progress Notes (Signed)
 " Cardiology Office Note:    Date:  04/22/2024   ID:  [name], DOB February 24, 1946, MRN [id number]  PCP:  Cleotilde Planas, MD   [facility] Providers Cardiologist:  Stanly DELENA Leavens, MD     Referring MD: Cleotilde Planas, MD   CC: CAD follow up  History of Present Illness:    [name] is a 78 y.o. female with a hx of HTN with DM, HLD with DM, CAC and Aortic atherosclerosis, IDA, Morbid Obesity, OSA on COPD previously need home O2, NSCLS s/p Palliative radiation in 2018, prior Ipilumumab & Nivolumab Last in 2019 at [facility] prior malignant tamponade 2018, who established in 2021 for PAF.  Started on [facility] and was lost to follow up. 2023: Had CP and SOB that coincided with running out of her COPD therapy 2024: Statin therapy, SGLT2i therapy Social: Occasionally comes with sister (from [place])- since moved back Discussed the use of AI scribe software for clinical note transcription with the patient, who gave verbal consent to proceed.  [name] is a 78 year old female with coronary artery disease, aortic atherosclerosis, and atrial fibrillation who presents for follow-up of her cardiac conditions.  She experiences frequent premature atrial contractions and premature ventricular contractions. She has a history of paroxysmal atrial fibrillation, initially diagnosed in 2021, and describes dizziness and shortness of breath during episodes. During a recent emergency department visit, she was in atrial fibrillation and experienced chest pain radiating from the front of her chest to her back between the shoulder blades, accompanied by nausea and discomfort.  She has a history of coronary artery disease and aortic atherosclerosis, with known coronary artery calcifications. No current chest pain is reported, but she has experienced it in the past, particularly when she ran out of her COPD medications in 2023. She was started on statin therapy and an SGLT2 inhibitor in 2024 and is  currently taking rosuvastatin .  She has a history of non-small cell lung cancer, treated with palliative radiation in 2018 and immunotherapy, which led to a malignant cardiac tamponade. She has not seen her lung doctor recently but uses Trelegy and an inhaler for her COPD management. She reports worsening shortness of breath and decreased exercise tolerance, with oxygen  levels dropping during physical activity.  She has a history of obstructive sleep apnea and morbid obesity. No swelling is noted, and she is taking her diuretics as prescribed, which she believes are helping.  She also has Parkinson's disease, noting that her tremors have worsened recently. She has not been on a blood thinner recently, only taking aspirin  as needed for heart symptoms. She has a history of chronic kidney disease stage 3A, with stable kidney function and normal sodium levels.    Past Medical History:  Diagnosis Date   Anemia    Anxiety    Arthritis    generalized   Asthma    Bleeding nose    Blood transfusion without reported diagnosis    Cancer (HCC) 2017   Lung    COPD (chronic obstructive pulmonary disease) (HCC)    Diabetes mellitus    Diverticulitis    Dyspnea    Dysrhythmia    paraxysmol atrial fibrillation    H. pylori infection    Heart murmur    mild AS (03/16/20 echo)   Hypertension    Liver disease    Lung cancer (HCC)    stage 4 , with mets to bone.   Lung nodule 2017   Both lungs-pt at [facility].  Neuromuscular disorder (HCC)    bilateral feet   Parkinson disease (HCC) 2010   Sleep apnea    Tubular adenoma of colon 05/2007    Past Surgical History:  Procedure Laterality Date   APPENDECTOMY     BREAST SURGERY     implants at age [age] (removed 5 years later)   CHOLECYSTECTOMY     COLON SURGERY     colectomy and reversal after diverticular ds   PARTIAL COLECTOMY     PERICARDIAL WINDOW N/A 02/28/2017   Procedure: PERICARDIAL WINDOW;  Surgeon: Lucas Dorise POUR, MD;  Location: [facility]  OR;  Service: Thoracic;  Laterality: N/A;   TONSILLECTOMY     TOTAL KNEE ARTHROPLASTY     right   VAGINAL HYSTERECTOMY      Current Medications: Current Meds  Medication Sig   albuterol  (PROVENTIL ) (2.5 MG/3ML) 0.083% nebulizer solution Take 3 mLs (2.5 mg total) by nebulization every 6 (six) hours as needed for wheezing or shortness of breath.   albuterol  (VENTOLIN  HFA) 108 (90 Base) MCG/ACT inhaler Inhale 2 puffs into the lungs every 6 (six) hours as needed for wheezing or shortness of breath.   ALPRAZolam  (XANAX ) 0.5 MG tablet Take by mouth daily at 6 (six) AM.   apixaban (ELIQUIS) 5 MG TABS tablet Take 1 tablet (5 mg total) by mouth 2 (two) times daily.   carbidopa -levodopa  (SINEMET  IR) 25-100 MG tablet Take 1 tablet by mouth in the morning and at bedtime.   Cholecalciferol  (VITAMIN D3) 125 MCG (5000 UT) CAPS Take 5,000 Units by mouth daily.   diltiazem  (CARDIZEM  CD) 120 MG 24 hr capsule Take 1 capsule (120 mg total) by mouth daily.   fentaNYL  (DURAGESIC  - DOSED MCG/HR) 100 MCG/HR Place 100 mcg onto the skin every other day. (Patient taking differently: Place 50 mcg onto the skin every other day.)   fluticasone  (FLONASE) 50 MCG/ACT nasal spray Place 1-2 sprays into both nostrils daily as needed for allergies or rhinitis.   furosemide  (LASIX ) 40 MG tablet Take 1 tablet (40 mg total) by mouth daily.   HUMALOG  KWIKPEN 100 UNIT/ML KiwkPen Inject 0.12 mLs (12 Units total) into the skin 3 (three) times daily before meals. Along with your normal sliding scale.   hydrocortisone  (CORTEF ) 5 MG tablet Take 10-15 mg by mouth See admin instructions. Take 15 mg by mouth in the morning and 10 mg at bedtime   insulin  glargine (LANTUS ) 100 UNIT/ML injection Inject 40-60 Units into the skin See admin instructions. Inject 60 units into the skin in the morning and 40 units at bedtime   meclizine  (ANTIVERT ) 25 MG tablet as needed for dizziness or nausea.   methylphenidate  (RITALIN ) 10 MG tablet Take 10 mg by  mouth as needed.   nitroGLYCERIN  (NITROSTAT ) 0.4 MG SL tablet PLACE AND DISSOLVE ONE TABLET UNDER TONGUE AS NEEDED FOR CHEST PAIN( EVERY 5 MINUTES, MAY TAKE UP TO 3 TIMES DAILY)   omeprazole  (PRILOSEC) 20 MG capsule Take 20 mg by mouth daily as needed (acid reflux).   ondansetron  (ZOFRAN ) 8 MG tablet Take 8 mg by mouth every 8 (eight) hours as needed for nausea or vomiting.   Oxycodone  HCl 20 MG TABS Take 20 mg by mouth every 4 (four) hours as needed (for pain).   pregabalin  (LYRICA ) 50 MG capsule Take 50 mg by mouth in the morning.   pregabalin  (LYRICA ) 50 MG capsule Take 50 mg by mouth as directed. 50 am, 50 noon, 150pm   PRESCRIPTION MEDICATION CPAP- At bedtime  promethazine (PHENERGAN) 12.5 MG tablet Take 12.5 mg by mouth every 6 (six) hours as needed for nausea or vomiting.   rosuvastatin  (CRESTOR ) 10 MG tablet Take 1 tablet (10 mg total) by mouth daily.   sertraline  (ZOLOFT ) 100 MG tablet Take 100 mg by mouth daily.   spironolactone  (ALDACTONE ) 25 MG tablet Take 0.5 tablets (12.5 mg total) by mouth daily.   SYNJARDY XR 01-999 MG TB24 Take 1 tablet by mouth every morning.   TRELEGY ELLIPTA  100-62.5-25 MCG/INH AEPB Take 1 puff by mouth daily.   triamcinolone  ointment (KENALOG ) 0.5 % Apply 1 Application topically at bedtime.   [DISCONTINUED] ALPRAZolam  (XANAX ) 1 MG tablet Take 1 tablet (1 mg total) 2 (two) times daily as needed by mouth for anxiety.   [DISCONTINUED] aspirin  81 MG chewable tablet Chew 81-162 mg by mouth as needed (for chest pain).   [DISCONTINUED] fentaNYL  (DURAGESIC ) 25 MCG/HR Place 1 patch onto the skin every other day.   [DISCONTINUED] fluconazole  (DIFLUCAN ) 150 MG tablet Take 1 tablet (150 mg total) by mouth every 3 (three) days. For three doses     Allergies:   Ozempic (0.25 or 0.5 mg-dose) [semaglutide(0.25 or 0.5mg -dos)], Morphine  and codeine, and Tape   Social History   Socioeconomic History   Marital status: Single    Spouse name: Not on file   Number of  children: 5   Years of education: Not on file   Highest education level: Not on file  Occupational History   Occupation: retired    Comment: designer, fashion/clothing; [employer]  Tobacco Use   Smoking status: Former    Current packs/day: 0.00    Average packs/day: 1 pack/day for 28.0 years (28.0 ttl pk-yrs)    Types: Cigarettes    Start date: 03/20/1959    Quit date: 03/20/1987    Years since quitting: 37.1   Smokeless tobacco: Never  Vaping Use   Vaping status: Never Used  Substance and Sexual Activity   Alcohol use: Not Currently    Comment: 2 margaritas a year   Drug use: No   Sexual activity: Not Currently  Other Topics Concern   Not on file  Social History Narrative   Caffiene 16 oz coffee every am   Retired:  [employer]   5 kids.     Social Drivers of Health   Tobacco Use: Medium Risk (04/11/2024)   Patient History    Smoking Tobacco Use: Former    Smokeless Tobacco Use: Never    Passive Exposure: Not on Actuary Strain: Not on file  Food Insecurity: No Food Insecurity (02/01/2023)   Hunger Vital Sign    Worried About Running Out of Food in the Last Year: Never true    Ran Out of Food in the Last Year: Never true  Transportation Needs: No Transportation Needs (02/01/2023)   PRAPARE - Administrator, Civil Service (Medical): No    Lack of Transportation (Non-Medical): No  Physical Activity: Not on file  Stress: Not on file  Social Connections: Not on file  Depression (EYV7-0): High Risk (11/19/2023)   Depression (PHQ2-9)    PHQ-2 Score: 15  Alcohol Screen: Not on file  Housing: Unknown (02/29/2024)   [facility]   Epic    Unable to Pay for Housing in the Last Year: Not on file    Number of Times Moved in the Last Year: Not on file    At any time in the past 12  months, were you homeless or living in a shelter (including now)?: No  Utilities: Not At Risk (02/01/2023)   [facility] Utilities    Threatened with  loss of utilities: No  Health Literacy: Not on file    Family History: The patient's family history includes Cancer in her brother, maternal grandmother, and mother; Cancer - Lung in her brother and mother; Diabetes Mellitus II in her sister and son. There is no history of Colon cancer.  ROS:   Please see the history of present illness.     EKGs/Labs/Other Studies Reviewed:    The following studies were reviewed today:  Cardiac Studies & Procedures   ______________________________________________________________________________________________   STRESS TESTS  MYOCARDIAL PERFUSION IMAGING 10/08/2002   ECHOCARDIOGRAM  ECHOCARDIOGRAM COMPLETE 02/03/2023  Narrative ECHOCARDIOGRAM REPORT    Patient Name:   [name] Date of Exam: 02/03/2023 Medical Rec #:  [id number]          Height:       63.0 in Accession #:    [id number]         Weight:       255.3 lb Date of Birth:  12-03-1945         BSA:          2.145 m Patient Age:    76 years           BP:           129/49 mmHg Patient Gender: F                  HR:           60 bpm. Exam Location:  Inpatient  Procedure: 2D Echo, Cardiac Doppler and Color Doppler  Indications:    CHF  History:        Patient has prior history of Echocardiogram examinations, most recent 09/26/2021. COPD, Signs/Symptoms:Parkinson's, Murmur, Dyspnea, Shortness of Breath and Edema; Risk Factors:Sleep Apnea, Hypertension and Diabetes. Lung CA.  Sonographer:    Clotilda Center Referring Phys: [id number] RIPUDEEP K RAI   Sonographer Comments: Suboptimal subcostal window. Image acquisition challenging due to patient body habitus and Image acquisition challenging due to respiratory motion. IMPRESSIONS   1. Left ventricular ejection fraction, by estimation, is 65 to 70%. The left ventricle has normal function. The left ventricle has no regional wall motion abnormalities. There is mild left ventricular hypertrophy. Left ventricular diastolic  parameters are consistent with Grade I diastolic dysfunction (impaired relaxation). 2. Right ventricular systolic function is normal. The right ventricular size is normal. There is normal pulmonary artery systolic pressure. The estimated right ventricular systolic pressure is 30.5 mmHg. 3. The mitral valve is degenerative. Trivial mitral valve regurgitation. No evidence of mitral stenosis. 4. The aortic valve is abnormal. There is moderate calcification of the aortic valve. Aortic valve regurgitation is not visualized. Mild aortic valve stenosis. Aortic valve area, by VTI measures 2.16 cm. Aortic valve mean gradient measures 16.0 mmHg. Aortic valve Vmax measures 2.49 m/s. 5. The inferior vena cava is normal in size with greater than 50% respiratory variability, suggesting right atrial pressure of 3 mmHg. 6. Increased flow velocities may be secondary to anemia, thyrotoxicosis, hyperdynamic or high flow state.  FINDINGS Left Ventricle: Left ventricular ejection fraction, by estimation, is 65 to 70%. The left ventricle has normal function. The left ventricle has no regional wall motion abnormalities. The left ventricular internal cavity size was normal in size. There is mild left ventricular hypertrophy. Left ventricular diastolic parameters are consistent with  Grade I diastolic dysfunction (impaired relaxation).  Right Ventricle: The right ventricular size is normal. No increase in right ventricular wall thickness. Right ventricular systolic function is normal. There is normal pulmonary artery systolic pressure. The tricuspid regurgitant velocity is 2.62 m/s, and with an assumed right atrial pressure of 3 mmHg, the estimated right ventricular systolic pressure is 30.5 mmHg.  Left Atrium: Left atrial size was normal in size.  Right Atrium: Right atrial size was normal in size.  Pericardium: There is no evidence of pericardial effusion.  Mitral Valve: The mitral valve is degenerative in  appearance. Mild mitral annular calcification. Trivial mitral valve regurgitation. No evidence of mitral valve stenosis. MV peak gradient, 7.3 mmHg. The mean mitral valve gradient is 3.0 mmHg.  Tricuspid Valve: The tricuspid valve is normal in structure. Tricuspid valve regurgitation is trivial. No evidence of tricuspid stenosis.  Aortic Valve: The aortic valve is abnormal. There is moderate calcification of the aortic valve. Aortic valve regurgitation is not visualized. Mild aortic stenosis is present. Aortic valve mean gradient measures 16.0 mmHg. Aortic valve peak gradient measures 24.8 mmHg. Aortic valve area, by VTI measures 2.16 cm.  Pulmonic Valve: The pulmonic valve was normal in structure. Pulmonic valve regurgitation is trivial. No evidence of pulmonic stenosis.  Aorta: The aortic root is normal in size and structure.  Venous: The inferior vena cava is normal in size with greater than 50% respiratory variability, suggesting right atrial pressure of 3 mmHg.  IAS/Shunts: The interatrial septum was not well visualized.   LEFT VENTRICLE PLAX 2D LVIDd:         5.00 cm     Diastology LVIDs:         3.60 cm     LV e' medial:    4.57 cm/s LV PW:         1.20 cm     LV E/e' medial:  20.9 LV IVS:        1.20 cm     LV e' lateral:   6.64 cm/s LVOT diam:     2.20 cm     LV E/e' lateral: 14.4 LV SV:         116 LV SV Index:   54 LVOT Area:     3.80 cm  LV Volumes (MOD) LV vol d, MOD A2C: 69.8 ml LV vol d, MOD A4C: 84.6 ml LV vol s, MOD A2C: 24.9 ml LV vol s, MOD A4C: 37.3 ml LV SV MOD A2C:     44.9 ml LV SV MOD A4C:     84.6 ml LV SV MOD BP:      49.4 ml  RIGHT VENTRICLE            IVC RV Basal diam:  3.80 cm    IVC diam: 0.90 cm RV Mid diam:    3.30 cm RV S prime:     9.57 cm/s TAPSE (M-mode): 1.6 cm  LEFT ATRIUM             Index        RIGHT ATRIUM           Index LA diam:        4.00 cm 1.86 cm/m   RA Area:     14.10 cm LA Vol (A2C):   56.8 ml 26.48 ml/m  RA Volume:    30.90 ml  14.41 ml/m LA Vol (A4C):   58.1 ml 27.09 ml/m LA Biplane Vol: 58.6 ml 27.32 ml/m AORTIC VALVE AV Area (  Vmax):    1.94 cm AV Area (Vmean):   1.89 cm AV Area (VTI):     2.16 cm AV Vmax:           249.00 cm/s AV Vmean:          191.000 cm/s AV VTI:            0.536 m AV Peak Grad:      24.8 mmHg AV Mean Grad:      16.0 mmHg LVOT Vmax:         127.00 cm/s LVOT Vmean:        95.000 cm/s LVOT VTI:          0.304 m LVOT/AV VTI ratio: 0.57  AORTA Ao Root diam: 3.10 cm Ao Asc diam:  3.60 cm  MITRAL VALVE                TRICUSPID VALVE MV Area (PHT): 2.56 cm     TR Peak grad:   27.5 mmHg MV Area VTI:   3.00 cm     TR Vmax:        262.00 cm/s MV Peak grad:  7.3 mmHg MV Mean grad:  3.0 mmHg     SHUNTS MV Vmax:       1.35 m/s     Systemic VTI:  0.30 m MV Vmean:      84.4 cm/s    Systemic Diam: 2.20 cm MV Decel Time: 296 msec MV E velocity: 95.70 cm/s MV A velocity: 129.00 cm/s MV E/A ratio:  0.74  Soyla Merck MD Electronically signed by Soyla Merck MD Signature Date/Time: 02/03/2023/2:34:46 PM    Final    MONITORS  LONG TERM MONITOR (3-14 DAYS) 03/20/2023  Narrative   Patient had a minimum heart rate of 49 bpm, maximum heart rate of 163 bpm, and average heart rate of 84 bpm.   Predominant underlying rhythm was sinus.   Two short runs of NSVT less than 6 beats.   Isolated PACs were rare (<1.0%).   Isolated PVCs were occasional (3.8%).   No triggered and diary events.  Occasional PVCs.   CT SCANS  CT CORONARY MORPH W/CTA COR W/SCORE 10/20/2021  Addendum 10/21/2021  2:53 PM ADDENDUM REPORT: 10/21/2021 14:51  EXAM: OVER-READ INTERPRETATION  CT CHEST  The following report is an over-read performed by radiologist Dr. Marcey Diones [facility] on 10/21/2021. This over-read does not include interpretation of cardiac or coronary anatomy or pathology. The coronary CTA interpretation by the cardiologist is attached.  COMPARISON:   Prior CTA of the chest on 06/19/2017  FINDINGS: No significant noncardiac vascular findings. Visualized mediastinum and hilar regions demonstrate no lymphadenopathy or masses. Visualized lungs show no evidence of pulmonary edema, consolidation, pneumothorax, nodule or pleural fluid. Visualized upper abdomen and bony structures are unremarkable.  IMPRESSION: No significant incidental findings.   Electronically Signed By: Marcey Moan M.D. On: 10/21/2021 14:51  Narrative CLINICAL DATA:  78 yo female with chest pain  EXAM: Cardiac/Coronary CTA  TECHNIQUE: A non-contrast, gated CT scan was obtained with axial slices of 3 mm through the heart for calcium  scoring. Calcium  scoring was performed using the Agatston method. A 120 kV prospective, gated, contrast cardiac scan was obtained. Gantry rotation speed was 250 msecs and collimation was 0.6 mm. Two sublingual nitroglycerin  tablets (0.8 mg) were given. The 3D data set was reconstructed in 5% intervals of the 35-75% of the R-R cycle. Diastolic phases were analyzed on a dedicated workstation using MPR,  MIP, and VRT modes. The patient received 95 cc of contrast.  FINDINGS: Image quality: Average.  Noise artifact is: Moderate.  (Mis-registration).  Coronary Arteries:  Normal coronary origin.  Right dominance.  Left main: The left main is a large caliber vessel with a normal take off from the left coronary cusp that trifurcates into a LAD, LCX, and ramus intermedius. There is minimal (0-24) calcified plaque in the ostium.  Left anterior descending artery: The LAD is has minimal (0-24) soft plaque in the proximal to mid vessel. The LAD gives off 2 patent diagonal branches.  Ramus intermedius: Very small.  Left circumflex artery: The LCX is non-dominant and has mild (25-49) soft plaque in the mid vessel. The LCX gives off 2 patent obtuse marginal branches.  Right coronary artery: The RCA is dominant with normal take off  from the right coronary cusp. There is mild (25-49) mixed plaque stenosis in the proximal vessel. The RCA terminates as a PDA and right posterolateral branch without evidence of plaque or stenosis.  Right Atrium: Mildly dilated.  Right Ventricle: The right ventricular cavity is within normal limits.  Left Atrium: Left atrial size is mildly dilated with no left atrial appendage filling defect.  Left Ventricle: The ventricular cavity size is within normal limits. There are no stigmata of prior infarction. There is no abnormal filling defect.  Pulmonary arteries: Normal in size without proximal filling defect.  Pulmonary veins: Normal pulmonary venous drainage.  Pericardium: Normal thickness with no significant effusion or calcium  present.  Cardiac valves: The aortic valve is trileaflet with calcification. The mitral valve is normal structure without significant calcification.  Aorta: Normal caliber with aortic atherosclerosis noted.  Extra-cardiac findings: See attached radiology report for non-cardiac structures.  IMPRESSION: 1. Coronary calcium  score of 63.1. This was 24 percentile for age-, sex, and race-matched controls.  2. Normal coronary origin with right dominance.  3. Mild (25-49) stenoses in the Lcx and RCA.  4. Aortic atherosclerosis.  5. Calcified aortic valve.  6. Mild biatrial enlargement.  RECOMMENDATIONS: CAD-RADS 2: Mild non-obstructive CAD (25-49%). Consider non-atherosclerotic causes of chest pain. Consider preventive therapy and risk factor modification.  Redell Shallow, MD  Electronically Signed: By: Redell Shallow M.D. On: 10/20/2021 17:11     ______________________________________________________________________________________________        Recent Labs: 04/11/2024: BUN 18; Creatinine, Ser 1.07; Hemoglobin 13.3; Platelets 234; Potassium 4.0; Sodium 139  Recent Lipid Panel    Component Value Date/Time   CHOL  10/01/2009 0201     153        ATP III CLASSIFICATION:  <200     mg/dL   Desirable  799-760  mg/dL   Borderline High  >=759    mg/dL   High          TRIG 727 (H) 10/01/2009 0201   HDL 37 (L) 10/01/2009 0201   CHOLHDL 4.1 10/01/2009 0201   VLDL 54 (H) 10/01/2009 0201   LDLCALC  10/01/2009 0201    62        Total Cholesterol/HDL:CHD Risk Coronary Heart Disease Risk Table                     Men   Women  1/2 Average Risk   3.4   3.3  Average Risk       5.0   4.4  2 X Average Risk   9.6   7.1  3 X Average Risk  23.4   11.0  Use the calculated Patient Ratio above and the CHD Risk Table to determine the patient's CHD Risk.        ATP III CLASSIFICATION (LDL):  <100     mg/dL   Optimal  899-870  mg/dL   Near or Above                    Optimal  130-159  mg/dL   Borderline  839-810  mg/dL   High  >809     mg/dL   Very High   LDLDIRECT 85 02/26/2023 1539       Physical Exam:    VS:  BP 108/65 (BP Location: Right Arm)   Pulse 77   Ht 5' 4 (1.626 m)   Wt 240 lb 9.6 oz (109.1 kg)   SpO2 92%   BMI 41.30 kg/m     Wt Readings from Last 3 Encounters:  04/22/24 240 lb 9.6 oz (109.1 kg)  04/11/24 240 lb (108.9 kg)  02/19/24 242 lb 14.4 oz (110.2 kg)    Gen: No distress, morbidly obese   Neck: No JVD Cardiac: No rubs or gallops, systolic murmur, RRR +2 radial pulses Respiratory: No wheezes, normal effort, normal respiratory rate GI: Soft, nontender, non-distended  MS: Noedema;  moves all extremities Integument: Skin feels warm Neuro:  At time of evaluation, alert and oriented to person/place/time/situation, resting tremor is more prominent Psych: Normal affect, patient feels ok  ASSESSMENT/PLAN:    Paroxysmal atrial fibrillation with frequent PACs and PVCs - Paroxysmal atrial fibrillation with recent episode in the emergency department. Frequent PACs and PVCs. Previous discontinuation of anticoagulation without clinical evidence. Risk of stroke due to AFib. Symptoms include  dizziness and shortness of breath during AFib episodes. - Discontinued aspirin  - Initiated Eliquis 5 mg PO BID - Ordered CBC in 2 weeks - Ordered 4-day non-live heart monitor (adhesive issues) - If symptomatic AFib, will schedule evaluation for DCCV  Coronary artery disease with aortic atherosclerosis - Coronary artery calcifications and aortic atherosclerosis. Recent chest pain episode. No current chest pain. Stress test not feasible due to COPD and Parkinson's disease. - If heart monitor does not resolve chest pain, will order CCTA, this will be challenging due to ectopy but she had few options  Hyperlipidemia - Managed with statin therapy. Aggressive management due to history of non-small cell lung cancer with chest radiation  Mild aortic stenosis Noted on previous echocardiogram. - If no resolution of symptoms, will repeat echocardiogram, last performed in 2024  Right bundle branch block - stable right bundle branch block.  Chronic kidney disease stage 3A CKD stage 3A with well-managed kidney function. No significant fluid overload.  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 No recent exacerbations. Oxygen  saturation drops with exertion. Obstructive sleep apnea noted.  Managed by PCCM  Morbid obesity - Improving  Parkinson's disease Recent worsening of tremor.  Three month f/u with me or my team  Time:   I have spent a total of 41 minutes with the patient reviewing notes, imaging, EKGs, labs, and examining the patient as well as establishing an assessment and plan that was discussed personally with the patient.   Stanly Leavens, MD FASE  [facility] [place]  [facility]  [address] [place], [place] [address] [phone]  4:03 PM  "

## 2024-04-29 ENCOUNTER — Other Ambulatory Visit: Payer: Self-pay | Admitting: Obstetrics and Gynecology

## 2024-04-29 DIAGNOSIS — L292 Pruritus vulvae: Secondary | ICD-10-CM

## 2024-04-29 DIAGNOSIS — L280 Lichen simplex chronicus: Secondary | ICD-10-CM

## 2024-04-30 NOTE — Telephone Encounter (Signed)
 Needs follow up appt.

## 2024-05-14 ENCOUNTER — Ambulatory Visit: Payer: Self-pay | Admitting: Internal Medicine

## 2024-05-14 DIAGNOSIS — I3139 Other pericardial effusion (noninflammatory): Secondary | ICD-10-CM | POA: Diagnosis not present

## 2024-05-14 DIAGNOSIS — I480 Paroxysmal atrial fibrillation: Secondary | ICD-10-CM

## 2024-06-23 ENCOUNTER — Ambulatory Visit: Admitting: Internal Medicine

## 2024-07-08 ENCOUNTER — Ambulatory Visit: Admitting: Internal Medicine
